# Patient Record
Sex: Male | Born: 1935 | Race: White | Hispanic: No | Smoking: Never smoker
Health system: Southern US, Community
[De-identification: ages and names within clinical notes are randomized; demographics above are authoritative.]

## PROBLEM LIST (undated history)

## (undated) DIAGNOSIS — S42009A Fracture of unspecified part of unspecified clavicle, initial encounter for closed fracture: Secondary | ICD-10-CM

## (undated) DIAGNOSIS — I639 Cerebral infarction, unspecified: Secondary | ICD-10-CM

## (undated) DIAGNOSIS — R413 Other amnesia: Secondary | ICD-10-CM

## (undated) DIAGNOSIS — I10 Essential (primary) hypertension: Secondary | ICD-10-CM

## (undated) DIAGNOSIS — I679 Cerebrovascular disease, unspecified: Secondary | ICD-10-CM

## (undated) DIAGNOSIS — F0390 Unspecified dementia without behavioral disturbance: Secondary | ICD-10-CM

## (undated) DIAGNOSIS — I480 Paroxysmal atrial fibrillation: Secondary | ICD-10-CM

## (undated) DIAGNOSIS — Z9289 Personal history of other medical treatment: Secondary | ICD-10-CM

## (undated) DIAGNOSIS — Z8701 Personal history of pneumonia (recurrent): Secondary | ICD-10-CM

## (undated) DIAGNOSIS — I219 Acute myocardial infarction, unspecified: Secondary | ICD-10-CM

## (undated) DIAGNOSIS — G459 Transient cerebral ischemic attack, unspecified: Secondary | ICD-10-CM

## (undated) DIAGNOSIS — S22010A Wedge compression fracture of first thoracic vertebra, initial encounter for closed fracture: Secondary | ICD-10-CM

## (undated) DIAGNOSIS — I2510 Atherosclerotic heart disease of native coronary artery without angina pectoris: Secondary | ICD-10-CM

## (undated) DIAGNOSIS — R55 Syncope and collapse: Secondary | ICD-10-CM

## (undated) DIAGNOSIS — IMO0002 Reserved for concepts with insufficient information to code with codable children: Secondary | ICD-10-CM

## (undated) DIAGNOSIS — G4731 Primary central sleep apnea: Secondary | ICD-10-CM

## (undated) DIAGNOSIS — B029 Zoster without complications: Secondary | ICD-10-CM

## (undated) DIAGNOSIS — S4290XA Fracture of unspecified shoulder girdle, part unspecified, initial encounter for closed fracture: Secondary | ICD-10-CM

## (undated) DIAGNOSIS — R911 Solitary pulmonary nodule: Secondary | ICD-10-CM

## (undated) DIAGNOSIS — G4739 Other sleep apnea: Secondary | ICD-10-CM

## (undated) HISTORY — DX: Other sleep apnea: G47.39

## (undated) HISTORY — DX: Syncope and collapse: R55

## (undated) HISTORY — DX: Fracture of unspecified part of unspecified clavicle, initial encounter for closed fracture: S42.009A

## (undated) HISTORY — DX: Personal history of pneumonia (recurrent): Z87.01

## (undated) HISTORY — DX: Cerebrovascular disease, unspecified: I67.9

## (undated) HISTORY — PX: KYPHOPLASTY: SHX5884

## (undated) HISTORY — PX: CATARACT EXTRACTION: SUR2

## (undated) HISTORY — PX: TONSILLECTOMY: SUR1361

## (undated) HISTORY — DX: Personal history of other medical treatment: Z92.89

## (undated) HISTORY — DX: Transient cerebral ischemic attack, unspecified: G45.9

## (undated) HISTORY — DX: Primary central sleep apnea: G47.31

## (undated) HISTORY — DX: Paroxysmal atrial fibrillation: I48.0

## (undated) HISTORY — DX: Zoster without complications: B02.9

## (undated) HISTORY — DX: Reserved for concepts with insufficient information to code with codable children: IMO0002

## (undated) HISTORY — DX: Atherosclerotic heart disease of native coronary artery without angina pectoris: I25.10

## (undated) HISTORY — DX: Essential (primary) hypertension: I10

## (undated) HISTORY — PX: SHOULDER SURGERY: SHX246

---

## 1998-12-29 ENCOUNTER — Encounter: Payer: Self-pay | Admitting: Emergency Medicine

## 1998-12-29 ENCOUNTER — Inpatient Hospital Stay (HOSPITAL_COMMUNITY): Admission: EM | Admit: 1998-12-29 | Discharge: 1999-01-01 | Payer: Self-pay | Admitting: Emergency Medicine

## 1999-03-05 ENCOUNTER — Ambulatory Visit (HOSPITAL_COMMUNITY): Admission: RE | Admit: 1999-03-05 | Discharge: 1999-03-05 | Payer: Self-pay | Admitting: *Deleted

## 1999-03-05 ENCOUNTER — Encounter (INDEPENDENT_AMBULATORY_CARE_PROVIDER_SITE_OTHER): Payer: Self-pay | Admitting: Specialist

## 2000-07-12 ENCOUNTER — Emergency Department (HOSPITAL_COMMUNITY): Admission: EM | Admit: 2000-07-12 | Discharge: 2000-07-12 | Payer: Self-pay | Admitting: Emergency Medicine

## 2005-01-04 ENCOUNTER — Emergency Department (HOSPITAL_COMMUNITY): Admission: EM | Admit: 2005-01-04 | Discharge: 2005-01-04 | Payer: Self-pay | Admitting: Emergency Medicine

## 2005-01-05 ENCOUNTER — Inpatient Hospital Stay (HOSPITAL_COMMUNITY): Admission: EM | Admit: 2005-01-05 | Discharge: 2005-01-11 | Payer: Self-pay | Admitting: Emergency Medicine

## 2005-01-06 ENCOUNTER — Encounter (INDEPENDENT_AMBULATORY_CARE_PROVIDER_SITE_OTHER): Payer: Self-pay | Admitting: *Deleted

## 2005-11-30 ENCOUNTER — Ambulatory Visit: Payer: Self-pay | Admitting: Physical Medicine & Rehabilitation

## 2005-11-30 ENCOUNTER — Inpatient Hospital Stay (HOSPITAL_COMMUNITY): Admission: EM | Admit: 2005-11-30 | Discharge: 2005-12-03 | Payer: Self-pay | Admitting: Emergency Medicine

## 2005-12-01 ENCOUNTER — Encounter (INDEPENDENT_AMBULATORY_CARE_PROVIDER_SITE_OTHER): Payer: Self-pay | Admitting: *Deleted

## 2005-12-18 ENCOUNTER — Encounter: Admission: RE | Admit: 2005-12-18 | Discharge: 2006-01-23 | Payer: Self-pay | Admitting: Internal Medicine

## 2008-06-03 ENCOUNTER — Encounter: Admission: RE | Admit: 2008-06-03 | Discharge: 2008-06-03 | Payer: Self-pay | Admitting: Orthopaedic Surgery

## 2011-01-24 NOTE — H&P (Signed)
NAME:  Derek [name], Derek [name] NO.:  [id number]   MEDICAL RECORD NO.:  [id number]          PATIENT TYPE:  INP   LOCATION:  1832                         FACILITY:  [facility]   PHYSICIAN:  Ladell Pier, M.D.   DATE OF BIRTH:  Jul 10, 1927   DATE OF ADMISSION:  01/05/2005  DATE OF DISCHARGE:                                HISTORY & PHYSICAL   CHIEF COMPLAINT:  Syncopal episode.   HISTORY OF PRESENT ILLNESS:  The patient is a 77 year old white male with  past medical history significant for hypertension. The patient was brought  to the emergency department secondary to 2 episodes of syncope. The first  happened 1 day ago. He was at the [employer] and he passed out. Wife was  not there and patient does not remember the specifics of the incident. Then  today, while he was at home sitting around the table, his wife said that he  just was sitting at the table and his head just fell on the table. He had  loss of consciousness. He had no complaints of chest pain or shortness of  breath. She noted he was drooling from his mouth and he was shaking all  over. He did not regain consciousness until after EMS arrived. His wife is  not sure if he was able to have a conversation or was alert when he regained  consciousness. He lost control of his bladder function. During the incident,  he did not bite his tongue. There is no history of any recent illness. No  chest pain or shortness of breath. He noted that he became very dizzy prior  to the episode and that he has generalized weakness.   PAST MEDICAL HISTORY:  Significant for hypertension, cataract surgery.   FAMILY HISTORY:  Noncontributory.   SOCIAL HISTORY:  He does not smoke. Does not drink alcohol. He is married  with 2 children. He is self-employed.   MEDICATIONS:  He is taking Triamterene/HCTZ 37.5/25 1/2 a pill a day, Lotrel  5/20 1 daily, Valtrex 1 gram t.i.d.   ALLERGIES:  PENICILLIN, AUGMENTIN, SULFA.   REVIEW OF SYSTEMS:   As per the HPI.   PHYSICAL EXAMINATION:  VITAL SIGNS:  Temperature 100.8, blood pressure  139/59 laying down and 101/52 standing. Pulse went from 60 to 111.  Respiratory rate 20. Pulse ox 93%.  HEENT:  Head is normocephalic and atraumatic. Pupils are equal, round, and  reactive to light. His throat was without erythema.  CARDIOVASCULAR:  Regular rate and rhythm. With a 2 out of 6 systolic murmur.  LUNGS:  Clear to auscultation bilaterally. No wheezes, rhonchi, or rales.  ABDOMEN:  Soft, nontender, and nondistended. Positive bowel sounds.  EXTREMITIES:  Without edema.  NEUROLOGIC:  Non-focal. Strength was 5 out of 5 throughout. Cranial nerves 2-  12 are intact. Sensation was intact.   LABORATORY DATA:  Pending.   IMPRESSION/PLAN:  1.  One syncopal episode (differential diagnosis) with a combination of      hypoxia with 93% saturation and syncopal episode with orthostatic blood      pressure. Pulmonary embolism is possible. Will  do a D-dimer. Also with      his history of shaking, makes seizure also possible, or cerebrovascular      accident, dehydration, autonomic dysfunction. Will rule out all of the      above. Will get a 2-D echocardiogram, MRI, MRA, carotid Doppler's. Cycle      enzymes. D-dimer, blood cultures x2 with his mild fever and get a      neurology consultation. Will also hold his diuretics.  2.  Hypotension. Will hold his diuretic and continue his other home      medications. Will also start him on aspirin prophylaxis. Will do Lovenox      for deep vein thrombosis prophylaxis.      NJ/MEDQ  D:  01/05/2005  T:  01/06/2005  Job:  [id number]

## 2011-01-24 NOTE — Consult Note (Signed)
NAME:  Derek [name] NO.:  [id number]   MEDICAL RECORD NO.:  [id number]          PATIENT TYPE:  INP   LOCATION:  2013                         FACILITY:  [facility]   PHYSICIAN:  Marlan Palau, M.D.  DATE OF BIRTH:  Oct 22, 1926   DATE OF CONSULTATION:  01/06/2005  DATE OF DISCHARGE:                                   CONSULTATION   HISTORY OF PRESENT ILLNESS:  Derek [name] is a 77 year old, right-  handed white male born December 30, 1926, with a history of hypertension.  The  patient has had a recent outbreak of herpes zoster at the left thoracic  level 4.  The patient had a syncopal or near syncopal event that occurred on  January 04, 2005 while at a party.  Family members were not present, but it  appears that the patient got light-headed and was standing, was able to sit  down, does not believe he passed out, but those around him felt that he did  black out for a few seconds.  The patient had another event that occurred  while sitting while at home.  Family members noted that he became blanched  in the face and diaphoretic, lost consciousness for about 5-10 minutes.  EMS  was called.  The patient was noted to have some trembling or jerking, but no  tongue biting, but did have some urinary incontinence.  The patient came to  once he was laid down and put into the ambulance.  The patient has felt  wiped out since that time.  He was noted to have an irregular heartbeat, but  has not had any further blackouts.  The patient denied any chest pain, chest  pressure, although he was having a lot of chest pain with the zoster  infection.  Neurology was asked to see this patient for further evaluation.  MRI scan of the brain shows mild to moderate chronic small vessel disease.  No acute changes seen.  Cerebrovascular circulation is roughly unremarkable,  except there is some mild to moderate stenosis in the cavernous portion of  the left internal carotid artery, left middle  cerebral artery.  The patient  has no evidence of vertebrobasilar insufficiency.   PAST MEDICAL HISTORY:  1.  History of syncope x 2.  2.  Hypertension.  3.  Herpes zoster infection at the left thoracic level 4.  4.  History of bilateral cataract surgery.  5.  History of lymph node biopsy in the past.   The patient states allergy to PENICILLIN; AUGMENTIN; SULFA drugs.   Does not smoke or drink.   CURRENT MEDICATIONS:  1.  Aspirin 81 mg daily.  2.  Norvasc 5 mg daily.  3.  Lotensin 20 mg daily.  4.  Lovenox.   SOCIAL HISTORY:  This patient lives in the [place] area.  He is married.  Has two children who are alive and well.  Works as a IT trainer.   FAMILY MEDICAL HISTORY:  Notable that mother has passed away with cancer of  the colon.  Father died following a stroke.  Had history of diabetes.  The  patient has three brothers, two sisters.  One brother has had cancer and  died with cancer of the throat.  The patient has one brother who has had a  stroke before, two sisters, one with heart disease, and one with stroke.   REVIEW OF SYSTEMS:  Notable for no recent fevers, chills.  The patient  denies any headache, visual field changes.  The patient does not believe his  vision dimmed prior to loss of consciousness.  The patient denies any  problems with swallowing, neck pain.  Denies shortness of breath, abdominal  pain, and nausea and vomiting, troubles controlling the bowels or bladder,  with the exception of the urinary incontinence as above.  The patient did  not have any focal numbness or weakness on the arms or legs.  Does feel  somewhat generally wiped out.   PHYSICAL EXAMINATION:  VITAL SIGNS:  Blood pressure 147/69; heart rate 82  and irregular; respiratory rate 20; temperature 100.3.  GENERAL:  This patient is a fairly well-developed white male who is alert  and cooperative at the time of examination.  HEENT:  Head is atraumatic.  Eyes:  Pupils are equal, round, and  reactive to  light.  Disks, soft, flat bilaterally.  NECK:  Supple.  No carotid bruits noted.  RESPIRATORY:  Clear.  CARDIOVASCULAR:  Reveals an occasionally irregular rhythm, grade 2-3/6  systolic ejection murmur noted in the left lower sternal border.  EXTREMITIES:  Without significant edema.  NEUROLOGIC:  Cranial nerves as above.  Facial symmetry is present.  The  patient has good sensation in the face to pinprick and soft touch  bilaterally.  Has good strength in the facial muscles and the muscles of the  head turning and shoulder shrug bilaterally.  Speech is well enunciated, not  aphasic.  Motor testing reveals 5/5 strength in all fours.  Good symmetric  motor and tone is noted bilaterally.  Sensory testing is intact to pinprick  and soft touch bilaterally for sensation throughout.  The patient has good  finger-nose-finger, heel-to-shin.  Gait was not tested.  Deep tendon  reflexes depressed but symmetric.  Toes are neutral bilaterally.  No drift  is seen.   LABORATORY VALUES:  White count 5.2, hemoglobin 13.5, hematocrit 38.2, MCV  79.9, platelets 146.  Sodium 137, potassium 3.8, chloride 103, CO2 24,  glucose 113, BUN 15, creatinine 1.3, total bilirubin 0.8, alkaline  phosphatase 61, SGOT 33, SGPT 32, total protein 7, albumin 3.4, CK 62, MB  fraction 0.4, troponin I 0.02.   IMPRESSION:  1.  Syncopal event x 2.  2.  Hypertension.   This patient appears to have had a couple episodes of syncope in the last  two days.  The patient does have a history of diaphoresis, blanching of the  face, consistent with a drop in blood pressure, does have an irregular  pulse.  I suspect that this is not a neurogenic event.  MRI scan of the  brain has been done and shows chronic small vessel disease but no evidence  of  vertebrobasilar insufficiency or acute stroke.  Will proceed with  further workup to rule out a seizure event, but again I think this is  unlikely.   PLAN: 1.  EEG study.   2.  Check orthostatic blood pressures.  3.  Will follow the patient's clinical course while in house.      CKW/MEDQ  D:  01/06/2005  T:  01/06/2005  Job:  [id number]  cc:   Ladell Pier, M.D.  Fax: [phone]   [facility]  [address] [place].  [address]

## 2011-01-24 NOTE — Consult Note (Signed)
NAME:  Derek [name], Derek [name] NO.:  [id number]   MEDICAL RECORD NO.:  [id number]          PATIENT TYPE:  INP   LOCATION:  3032                         FACILITY:  [facility]   PHYSICIAN:  Bevelyn Buckles. Champey, M.D.DATE OF BIRTH:  [birth year]   DATE OF CONSULTATION:  11/30/2005  DATE OF DISCHARGE:                                   CONSULTATION   REQUESTING PHYSICIAN:  Ladell Pier, M.D.   REASON FOR CONSULTATION:  Stroke.   HISTORY OF PRESENT ILLNESS:  Derek [name] is a 77 year old Caucasian male  with a past medical history of hypertension and obstructive sleep apnea who  presents with a one day history of dysarthric slurred speech and  unsteadiness on his feet.  His symptoms started yesterday and when he woke  up this morning, he felt like his speech was more dysarthric.  He also has  had a slight right occipital headache for the past week.  He also noticed  some difficulty swallowing last evening when he was eating pecans.  He  denies any weakness, numbness, vision changes, falls, or loss of  consciousness.  The patient does have a history of one episode of atrial  fibrillation with pneumonia one year ago and has not had any recurrence.   PAST MEDICAL HISTORY:  Positive for hypertension and obstructive sleep  apnea.   CURRENT MEDICATIONS:  Aspirin, AcipHex, clonidine, Avalide, and metoprolol.   ALLERGIES:  The patient has drug allergies to PENICILLIN and SULFAS.   SOCIAL HISTORY:  The patient lives with his wife, denies any smoking or  alcohol use.   REVIEW OF SYSTEMS:  Positive as per history of present illness.  Negative as  per history of present illness and greater than 8 other organ systems.   PHYSICAL EXAMINATION:  VITAL SIGNS:  Temperature 98.1, blood pressure  161/82, then became 219/89, pulse 52, respirations 18, oxygen saturation  100%.  HEENT:  Normocephalic and atraumatic.  Extraocular muscles intact.  Pupils  equal, round, and reactive to light.  The  patient has possibly slight right  facial asymmetry with flattening of the nasolabial fold.  NECK:  Supple.  No carotid bruits are heard.  HEART:  Regular.  LUNGS:  Clear.  ABDOMEN:  Soft and nontender.  EXTREMITIES:  Good pulses.  NEUROLOGIC:  The patient is awake, alert, and oriented x3.  Speech is  dysarthric.  Memory and knowledge are within normal limits.  Cranial nerves:  The patient has some slight right facial asymmetry with flattening of the  nasolabial folds.  The rest of cranial nerves II-XII are grossly intact.  Motor examination shows 4+ to 5/5 strength and normal tone in all four  extremities.  Leg drift is noted.  Sensory examination is within normal  limits to light touch.  Reflexes are 2+ throughout.  Toes are right neutral,  left is neutral and downgoing.  Cerebellar function: The patient has some  difficulty with right finger to nose and rapid alternating movements.  Gait  was not assessed secondary to safety.   LABORATORIES:  Hemoglobin is 15.0, hematocrit 44.0, sodium is 136, potassium  3.9, chloride  is 105, CO2 25, BUN 12, creatinine 1.2, glucose 101.  CK-MB is  less than 1.  Troponin-I is less than 0.05.  Myoglobin is 75.8.  CT of the  head shows some chronic small vessel white matter disease with no acute  process or bleed.   IMPRESSION:  Derek [name] is a 77 year old Caucasian male with hypertension  and obstructive sleep apnea who presents with a one day history of  dysarthria and unsteadiness on his feet.  With findings on examination,  there is concern for possible posterior circulation stroke, possible right  cerebellar or brain stem.  Would like to evaluate further by obtaining an  MRI/MRA of the brain, carotid Doppler's, two-D echo.  Will change his  aspirin to Aggrenox.  Will check lipids and homocysteine level and then  place the patient on telemetry with his history of one episode of atrial  fibrillation.  Will keep systolic blood pressure less than  180 with  hydralazine intravenously.  The patient does have a low heart rate so we  will avoid labetalol intravenously.  Will give PT/OT and speech consults.  Will place the patient on DVT prophylaxis.  We will follow the patient while  he is in the hospital.      Bevelyn Buckles. Nash Shearer, M.D.  Electronically Signed     DRC/MEDQ  D:  11/30/2005  T:  12/02/2005  Job:  [id number]

## 2011-01-24 NOTE — Discharge Summary (Signed)
NAME:  Derek [name], BATALLA NO.:  [id number]   MEDICAL RECORD NO.:  [id number]          PATIENT TYPE:  INP   LOCATION:  2013                         FACILITY:  [facility]   PHYSICIAN:  Ladell Pier, M.D.   DATE OF BIRTH:  1926/10/06   DATE OF ADMISSION:  01/05/2005  DATE OF DISCHARGE:  01/10/2005                                 DISCHARGE SUMMARY   DISCHARGE DIAGNOSES:  1.  Syncopal episode.  2.  Shingles on the left chest extending to mid back with secondary      cellulitis.  3.  Hypertension.  4.  Heart murmur.  5.  Orthostatic hypotension.  6.  Fever.  7.  Mild hypoxia.  8.  Pneumonia.  9.  New onset atrial fibrillation resolved.  10. Pulmonary nodule on CT scan, repeat CT in 6 months.  11. Hypokalemia secondary to Lasix.  12. Pleural effusion.  13. Hypoglycemia secondary to prednisone.   DISCHARGE MEDICATIONS:  1.  Aspirin 81 mg daily.  2.  Doxycycline 100 mg twice a day for 8 days.  3.  Prednisone 10 mg two pills for 1 day then one pill for 2 days.  4.  Metoprolol 50 mg twice daily.  5.  Vicodin 5/500 one every 6 hours p.r.n. for pain.  6.  Atarax 25 one three times a day as needed for itching.  7.  Valtrex 1 g three times a day for 4 days.   FOLLOWUP APPOINTMENT:  Patient to follow up with Derek [name] in 1 week.  Make appointment to follow up with cardiology.   PROCEDURES DURING THE HOSPITALIZATION:  None.   HISTORY OF PRESENT ILLNESS:  Patient is a 77 year old white male past  medical history significant for hypertension.  Patient was brought to the  emergency room secondary to a syncopal episode, two episodes, one the day  prior to presentation and on the day of presentation.  Patient was at home,  he was sitting around the table and per his wife he loss consciousness, he  was out for about 5-10 minutes, he was drooling.  He had no complaints of  chest pain or shortness of breath.  Patient lost control of his bladder  during the incident, did not  bite his tongue, he complained of generalized  weakness.   PAST MEDICAL HISTORY/FAMILY HISTORY/SOCIAL HISTORY/MEDICATIONS/ALLERGIES AND  REVIEW OF SYSTEMS:  Per admission H&P.   PHYSICAL EXAMINATION ON DISCHARGE:  VITAL SIGNS:  Temperature 98.6, pulse of  80, respirations 20, blood pressure 154/81, pulse oximetry 97% on room air.  HEENT:  Head is normocephalic, atraumatic.  Pupils equal, round and reactive  to light.  Throat without erythema.  CARDIOVASCULAR:  Regular rate and rhythm with a 2/6 systolic murmur.  ABDOMEN:  Soft, nontender, nondistended.  Positive bowel sounds.  EXTREMITIES:  Without edema.  SKIN:  On the left chest area he has shingles with erythema that has  improved during the hospitalization.   HOSPITAL COURSE:  Problem 1. SYNCOPAL EPISODE.  For his syncopal episode  neurology was consulted.  He had an EEG to rule out seizures.  His EEG was  normal.  He had an MRI/MRA that did not show any acute ischemia, also had a  2-D echo that was negative, carotid Doppler's negative, D-dimer was  elevated, he had a followup chest CT that was normal.  The syncopal episode  was noted to be probably secondary to atrial fibrillation.  Atrial  fibrillation is probably secondary to the acute event of the pneumonia,  shingles and the stress of the situation.   Problem 2. ATRIAL FIBRILLATION.  He went into atrial fibrillation with RVR  on two occasions during admission.  Cardiology was consulted and he was  placed on Lovenox and beta blocker for rate control, it was thought that his  atrial fibrillation was probably secondary to stress.  He will be put on  aspirin.  He was treated with Lovenox inpatient and after his shingles  resolve he will follow up outpatient for Holter monitor and if he has  recurrent episodes of atrial fibrillation then he will be placed on chronic  anticoagulation.   Problem 3. SHINGLES.  He had shingles with secondary cellulitis.  He is on  Valtrex.  We will  treat him for a total of 10 days to 14 days and he was put  on prednisone because of severe itching and redness in the area and Atarax  p.r.n. for itching.   Problem 4. CELLULITIS SECONDARY TO NUMBER 1 SHINGLES ABOVE.  He was placed  on doxycycline.   Problem 5. PNEUMONIA/ATELECTASIS.  During his hospitalization his oxygen  level was low.  Chest x-ray done showed atelectasis versus pneumonia.  Discussed the chest x-ray report with radiology who thought the patient had  an infiltrate.  His fever spiked to 102.7.  He was started on antibiotics,  Rocephin, Zithromax and the fever resolved.   Problem 6. HYPOKALEMIA.  During the course of the hospitalization he was  given IV fluids and got a little volume overloaded.  He was placed on Lasix  and his symptoms of shortness of breath resolved.   Problem 7. PULMONARY NODULE.  CT scan of the chest showed a pulmonary nodule  and it was recommended that he have followup CT scan in 3-6 months.   Problem 8. HYPERTENSION.  His medication that he was on on admission was  discontinued and he was placed on Toprol for rate control.  BMP on the day  prior to discharge sodium 135, potassium 3.4 (that was replaced), chloride  101, CO2 25, glucose 131 (hypoglycemia) and that is secondary to prednisone,  BUN 14, creatinine 1, calcium 8.3.  WBC 9, hemoglobin 12, MCV of 79.9,  platelets 172.  BNP of 144.  Cardiac enzymes negative.  Urine culture  negative.  Lipid profile:  Cholesterol total 140, triglycerides of 89, HDL  of 31, LDL of 91.  TSH of 0.385.  D-dimer 1.15.  Chest x-ray showed  borderline cardiomegaly with bibasilar atelectasis and left-sided effusion.  CT of the chest  no evidence of PE, scattered mediastinal and hilar lymph nodes which are  borderline in size, no acute pulmonary findings.  There are three or four  small pulmonary nodules in the right lung.  She needs followup CT in 4-6  months.  EEG normal.      NJ/MEDQ  D:  01/10/2005  T:   01/11/2005  Job:  [id number]   cc:   [facility], Dr. Katrinka Blazing

## 2011-01-24 NOTE — H&P (Signed)
NAME:  Derek [name], Derek [name] NO.:  [id number]   MEDICAL RECORD NO.:  [id number]          PATIENT TYPE:  INP   LOCATION:  3032                         FACILITY:  [facility]   PHYSICIAN:  Thora Lance, M.D.  DATE OF BIRTH:  [birth year]   DATE OF ADMISSION:  11/30/2005  DATE OF DISCHARGE:                                HISTORY & PHYSICAL   CHIEF COMPLAINT:  Off-balance.   HISTORY OF PRESENT ILLNESS:  This is a 77 year old white male with history  of hypertension who presented with a 1-day history of mild dizziness,  unsteady gait, and speech difficulties.  Yesterday, on November 29, 2005, he  noticed that he was mildly off-balance and his speech seemed a little  slurred.  This morning this seemed much worse.  His speech has been slurred  and he has had difficultly feeling wobbly while walking, although he has not  fallen.  He tends to fall towards either side.  He has also felt somewhat  dizzy although not definite vertigo.  He had some trouble swallowing some  pecans last night.  He denied any fevers, visual changes, weakness, sensory  change, chest pain, shortness of breath, or palpitations.  He did complain  of some mild pain in his right occipital area this morning.  The patient was  admitted in May 2006 with a syncopal episode, rule out TIA.  He also had a  short episode of paroxysmal atrial fibrillation in the context of a  pneumonia and herpes zoster and cellulitis at that time.  He was seen by Dr.  Anne Hahn of neurology and had a TIA workup including an MRI which showed  chronic small-vessel disease, an MRI/MRA which showed a mild to moderate  left cavernous carotid stenosis and a proximal middle cerebral artery  stenosis.  A 2-D echocardiogram was normal and EEG was normal.  He claims  his systolic blood pressure at home ranges between 130 and over 200  systolic.   PAST MEDICAL HISTORY:  1.  Hypertension.  2.  GERD.  3.  Obstructive sleep apnea, on VPAP.  4.  History  of herpes zoster.  5.  Syncope, April 2006.  6.  Paroxysmal atrial fibrillation, April 2006.  Denies any recurrence.   PAST SURGICAL HISTORY:  1.  Cataracts.  2.  Lymph node biopsy.   ALLERGIES:  PENICILLIN or SULFA.   CURRENT MEDICATIONS:  1.  Lopressor 50 mg b.i.d.  2.  AcipHex 20 mg daily.  3.  Clonidine 0.1 mg b.i.d.  4.  Aspirin 81 mg a day.  5.  Avalide 150/12.5 mg daily.   FAMILY HISTORY:  Noncontributory.   SOCIAL HISTORY:  He is married.  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  Still active in the  business.  Smoking:  No.  Tobacco:  No.   REVIEW OF SYSTEMS:  As above.   PHYSICAL EXAMINATION:  GENERAL:  A 77 year old white male who appears  younger than his stated age.  VITAL SIGNS:  Temperature 98.1, blood pressure 161/81, heart rate 45,  respirations 16, oxygen saturation 100% on room air.  HEENT:  Pupils equal and respond to light.  Extraocular movements are  intact.  TMs are clear.  Oropharynx is clear.  NECK:  Supple.  No bruits, no lymphadenopathy.  LUNGS:  Clear.  HEART:  Regular rate and rhythm without murmur, gallop, or rub.  ABDOMEN:  Soft, nontender, normal bowel sounds.  No mass or  hepatosplenomegaly.  EXTREMITIES:  Show no edema.  NEUROLOGIC:  Alert and oriented x3.  Cranial nerves II-XII are intact.  Strength is 5/5 in all extremities.  There is no pronator drift.  Gait is  mildly wide-based, unsteady.  Cerebellar:  Finger-to-nose well done.  Speech:  Mild dysarthria.   LABORATORIES:  CBC pending.  Urinalysis negative.  Sodium 136, potassium  3.9, chloride 105, BUN 12, creatinine 1.2.  EKG:  Sinus bradycardia and  otherwise normal.  Chest x-ray:  No acute disease.  CT scan of the brain  shows chronic microvascular ischemic changes.   ASSESSMENT:  1.  Suspect posterior circulation cerebrovascular accident.  2.  Hypertension, poorly controlled.  3.  History of paroxysmal atrial  fibrillation.   PLAN:  1.  Admit to telemetry.  2.  MRI of the brain and MRA of the intracranial vessels.  3.  Carotid Dopplers.  4.  A 2-D echocardiogram.  5.  Initiate Aggrenox.  6.  Neurology consult.  7.  N.p.o.  8.  Swallowing evaluation March 26.  9.  PT and OT consult.           ______________________________  Thora Lance, M.D.     JJG/MEDQ  D:  11/30/2005  T:  12/02/2005  Job:  [id number]

## 2011-01-24 NOTE — Consult Note (Signed)
NAME:  Derek [name], Derek [name] NO.:  [id number]   MEDICAL RECORD NO.:  [id number]          PATIENT TYPE:  INP   LOCATION:  2013                         FACILITY:  [facility]   PHYSICIAN:  Lyn Records III, M.D.DATE OF BIRTH:  March 08, 1927   DATE OF CONSULTATION:  01/10/2005  DATE OF DISCHARGE:                                   CONSULTATION   CONCLUSIONS:  1.  Recurrent syncope probably neurally mediated.      1.  Two episodes occurred with prodrome of weakness, diaphoresis, and          pallor on April 28 and January 05, 2005, leading to this admission.      2.  One episode occurred as a teenager while singing in the church          choir.  2.  Paroxysmal atrial fibrillation noted since hospitalized.  The patient is      asymptomatic.  Perhaps, atrial fibrillation is related to an underlying      pulmonary febrile illness.      1.  Cardiac structure is not markedly abnormal, however, there is mild          left ventricular enlargement by echocardiography.  3.  Left chest shingles.  4.  Possible left lung pneumonia versus atelectasis secondary to shingles      with splinting.   RECOMMENDATIONS:  1.  TSH (done).  2.  Beta blocker therapy which could help to squash the arrhythmia and also      be a potential therapy for neurally mediated syncope.  3.  The patient may need anti-arrhythmic therapy if recurrent episodes of      atrial fibrillation occur despite beta blocker therapy.  4.  Continue monitoring patient.  If no recurrent atrial fibrillation, I      would still want him to have a 48 hour Holter monitor done as an      outpatient once he is over the febrile illness.  5.  Will likely eventually need an ischemic evaluation once he is over his      febrile illness.  6.  Agree with Lovenox for now but would not start chronic Coumadin therapy      unless there are recurrences of atrial fibrillation documented after the      patient's febrile illness has been completely  treated.   COMMENTS:  The patient is 67 and was admitted to the hospital on January 05, 2005, after a syncopal episode that occurred while sitting at a table in his  house.  He states that he was comfortable, had been reading, had eaten some  soup and began feeling warm all over and the next thing he knew he was on  the floor.  His family states that he had twitching, pallor, blanching, and  diaphoresis.  He did not complain of any chest discomfort or other  significant prodrome.  Once EMS got there and he was placed on the floor, he  came to.  Two days prior, he had had a similar episode while at a party,  began feeling warm all  over, and the next thing he knew he was down on the  floor.  There was a sense of impending doom preceding this episode.  No  chest discomfort or palpitations were noted.  He had one prior episode of  syncope during his teenage years while singing in the church choir.  He has  no exertional dyspnea, chest discomfort, or other cardiopulmonary  complaints.  Upon admission to the hospital here, he was noted to have  Herpes zoster infection of his left thorax in a dermatomal distribution.  He  had a fever of 101.3.  He was then admitted for evaluation of the syncope.   SIGNIFICANT MEDICAL PROBLEMS:  1.  Hypertension.  2.  History of cataract surgery.   FAMILY HISTORY:  No significant cardiac illnesses in the patient's family  other than that a sister has a history of atrial fibrillation and tachy-  brady syndrome and has had an ablation performed and a pacemaker placed.   MEDICATIONS AT HOME:  Triamterene HCTZ 37.5/25 mg daily, Lotrel 5/20 mg  daily, and Valtrex 1 gram p.o. t.i.d.   SOCIAL HISTORY:  He does not smoke or drink.  He is a self-employed IT trainer.   REVIEW OF SYMPTOMS:  Unremarkable.  The patient is active and without any  significant medical problems.   PHYSICAL EXAMINATION:  GENERAL:  The patient is in no acute distress, he is sitting at the bedside   and speaking with his family.  VITAL SIGNS:  Blood pressure 140/78, heart rate 80 and irregularly  irregular.  CHEST:  He has an erythematous eruption along the left thorax and left  chest.  NECK:  No JVD, no carotid bruits.  HEENT:  Pupils equal and reactive to light.  LUNGS:  Clear, no rales or wheezing is heard.  HEART:  Slightly irregular rhythm with heart rate increasing up to 120 beats  per minute at times, there is a 1/6 systolic murmur, no gallops are heard.  ABDOMEN:  Soft, bowel sounds normal.  EXTREMITIES:  No edema.  NEUROLOGICAL:  No obvious motor or sensory deficits.   LABORATORY DATA:  Potassium 3.5, BUN 10, creatinine 1, sodium 130.  Hemoglobin 12, platelet count 143,000.  CPK, MB, and troponin I are negative  x 5.  BNP and TSH were normal.  EKG reveals normal sinus rhythm with  frequent PACs.  There is one EKG that demonstrates atrial fibrillation.  No  acute ischemic changes noted.  Chest x-ray revealed borderline cardiomegaly,  bibasilar atelectasis.  Echo revealed normal LVEF, mild left atrial  enlargement, mild aortic valve thickening.   DISCUSSION:  The patient's history is complex.  Episodes of syncope could  merely represent neurally mediated syncope especially in light of the  patient's Herpes zoster infection that was causing pain and potentially  acting as a trigger for the episodes of syncope over the last two days prior  to admission.  He is a demonstrated fainter having had an episode of syncope  when he was a young adolescent while singing in the church choir.   The atrial fibrillation is asymptomatic with reference to anything the  patient feels.  This was picked up on the monitor and one wonders if this is  something that has been going on off and on over the years or if this is  something that has been precipitated by his febrile illness and recent bout  of significant shortness of breath.  We have initiated beta blocker therapy and we hope that  this  will help to suppress recurrence of atrial  fibrillation and also give him some protection against possible neurally  mechanisms of syncope.  As an outpatient, we would like for him to have a 48  hour Holter after he has completely recovered from his febrile illness and  the shingles.  We also hope that a  cardiac ischemic evaluation can be done at some later date, as well.  With  reference to Coumadin, I would not start Coumadin just yet but would use  aspirin 325 mg per day and start Coumadin only if documented recurrent  atrial fibrillation once the pulmonary process is completely resolved.      HWS/MEDQ  D:  01/10/2005  T:  01/10/2005  Job:  [id number]   cc:   Marlan Palau, M.D.  [address]  [address]  [place]  [place] [address]  Fax: [phone]   Ladell Pier, M.D.  Fax: [phone]

## 2011-01-24 NOTE — Discharge Summary (Signed)
NAME:  Derek [name], Derek [name] NO.:  [id number]   MEDICAL RECORD NO.:  [id number]          PATIENT TYPE:  INP   LOCATION:  3032                         FACILITY:  [facility]   PHYSICIAN:  Ladell Pier, M.D.   DATE OF BIRTH:  10-Jan-1927   DATE OF ADMISSION:  11/30/2005  DATE OF DISCHARGE:  12/03/2005                                 DISCHARGE SUMMARY   DISCHARGE DIAGNOSES:  1.  Acute pontine stroke with MRI showing pontine infarct.  2.  Hypertension.  3.  Gastroesophageal reflux disease.  4.  Obstructive sleep apnea on continuous positive airway pressure.  5.  History of herpes zoster.  6.  History of syncopal episode in April 2006.  7.  Paroxysmal atrial fibrillation April 2006 without recurrence.  8.  Cataracts.  9.  Lymph node biopsy.   DISCHARGE MEDICATIONS:  1.  Norvasc 10 mg daily.  2.  Aggrenox twice daily.  3.  Hydrochlorothiazide 25 mg daily.  4.  Irbesartan 150 mg daily.   CONSULTANTS:  1.  Neurology, Dr. Pearlean Brownie.  2.  Speech therapy and physical therapy.   PROCEDURES:  None.   FOLLOWUP APPOINTMENTS:  The patient to follow up with neurology and also  follow up with Dr. Olena Leatherwood.   HISTORY OF PRESENT ILLNESS:  The patient is a 77 year old white male,  history of hypertension, presented with 1 day of dizziness, unsteady gait,  and speech difficulty.  He had slurred speech and felt wobbly but no fall.   PAST MEDICAL HISTORY, FAMILY HISTORY, SOCIAL HISTORY, MEDICATIONS,  ALLERGIES, REVIEW OF SYSTEMS:  Per admission H&P.   PHYSICAL EXAMINATION ON DISCHARGE:  VITAL SIGNS:  Temperature 98.6, pulse  68, respirations 20, blood pressure 144/63, pulse oximetry 96% on room air.  HEENT:  Normocephalic, atraumatic.  Pupils equal, round, and reactive to  light.  Throat without erythema.  CARDIOVASCULAR:  Regular rate and rhythm.  LUNGS:  Clear bilaterally.  ABDOMEN:  Positive bowel sounds.  EXTREMITIES:  Without edema.  NEUROLOGIC:  Nonfocal.  Speech had improved  from previous.   HOSPITAL COURSE:  #1 - ACUTE STROKE.  The patient was admitted to the  hospital.  He was placed on Aggrenox, his aspirin.  Neurology was consulted.  He was placed on Aggrenox for acute CVA.  During the course of the  hospitalization he improved.  He was deemed not a candidate for inpatient  rehab and was sent home with home PT, OT, and speech therapy.   #2 - HYPERTENSION.  During his hospitalization he was continued on his home  medications.  Blood pressure remained stable.   #3 - GASTROESOPHAGEAL REFLUX DISEASE.  He was continued on PPI while he was  in the hospital.   #4 - NAUSEA, VOMITING.  He did have some episodes of nausea and vomiting  during his hospitalization that was treated with Zofran and resolved prior  to discharge.   DISCHARGE LABORATORY:  A 2-D echocardiogram showed overall left ventricular  systolic function was normal.  Head CT showed atrophy and chronic small-  vessel changes, no acute findings.  Chest x-ray showed poor inspiration,  possible basilar atelectasis.  MRA showed 50% stenosis of supraclinoid ICA  left.  Wbc's 6.1, hemoglobin 15, platelets 206.  PT 13.6, INR 1.0.  Sodium  136, potassium 3.9, chloride 103, CO2 26, glucose 110, BUN 11, creatinine  1.3.  Hemoglobin A1c 6.2.  Total cholesterol 189, triglycerides 150, HDL 28,  LDL 138.  UA normal.      Ladell Pier, M.D.  Electronically Signed     NJ/MEDQ  D:  01/07/2006  T:  01/07/2006  Job:  [id number]

## 2011-01-24 NOTE — Procedures (Signed)
CLINICAL HISTORY:  Derek [name] is seen here today for an EEG.  This was  ordered by his family practice service attending Dr. Ladell Pier.  The  consulting physician is Dr. Lesia Sago of [facility].  The patient is evaluated for syncopal episode versus seizure.   This 16-channel EEG recording with one channel representing heart rate and  rhythm was performed without hyperventilation due to the patient's history  of cardiopulmonary disease.  Photic stimulation was performed.   Medications:  The patient is on Tylenol, Lovenox, Lotensin, Norvasc and  aspirin.   DESCRIPTION:  A posterior dominant background rhythm was established at the  very beginning of the study and shows an 8 Hz rhythm.  There are frequent  electrode pops noted.  There is very little motion artifact seen and the  patient's EEG soon becomes typical for that of the drowsy stages then  finally enters sleep.  Sleep architecture is seen associated with vertex  sharp waves and sleep spindles forming K complexes.  The EKG shows a  presumed sinus rhythm between 72 and 78 beats per minute with occasional  PVCs.   CONCLUSION:  This is a normal EEG for the patient's age and conscious state.      GE:XBMW  D:  01/07/2005 18:59:03  T:  01/07/2005 23:24:02  Job #:  [id number]   cc:   [facility] Teaching Service

## 2011-02-04 ENCOUNTER — Emergency Department (HOSPITAL_COMMUNITY): Payer: Medicare Other

## 2011-02-04 ENCOUNTER — Inpatient Hospital Stay (HOSPITAL_COMMUNITY): Payer: Medicare Other

## 2011-02-04 ENCOUNTER — Inpatient Hospital Stay (HOSPITAL_COMMUNITY)
Admission: EM | Admit: 2011-02-04 | Discharge: 2011-02-13 | DRG: 484 | Disposition: A | Discharge status: Skilled Nursing Facility | Payer: Medicare Other | Attending: Internal Medicine | Admitting: Internal Medicine

## 2011-02-04 ENCOUNTER — Encounter (HOSPITAL_COMMUNITY): Payer: Self-pay | Admitting: Radiology

## 2011-02-04 DIAGNOSIS — R269 Unspecified abnormalities of gait and mobility: Secondary | ICD-10-CM | POA: Diagnosis present

## 2011-02-04 DIAGNOSIS — I10 Essential (primary) hypertension: Secondary | ICD-10-CM | POA: Diagnosis present

## 2011-02-04 DIAGNOSIS — S42213A Unspecified displaced fracture of surgical neck of unspecified humerus, initial encounter for closed fracture: Secondary | ICD-10-CM | POA: Diagnosis present

## 2011-02-04 DIAGNOSIS — IMO0002 Reserved for concepts with insufficient information to code with codable children: Secondary | ICD-10-CM | POA: Diagnosis present

## 2011-02-04 DIAGNOSIS — Y9301 Activity, walking, marching and hiking: Secondary | ICD-10-CM

## 2011-02-04 DIAGNOSIS — W010XXA Fall on same level from slipping, tripping and stumbling without subsequent striking against object, initial encounter: Secondary | ICD-10-CM | POA: Diagnosis present

## 2011-02-04 DIAGNOSIS — I4891 Unspecified atrial fibrillation: Secondary | ICD-10-CM | POA: Diagnosis present

## 2011-02-04 DIAGNOSIS — R32 Unspecified urinary incontinence: Secondary | ICD-10-CM | POA: Diagnosis present

## 2011-02-04 DIAGNOSIS — R55 Syncope and collapse: Secondary | ICD-10-CM | POA: Diagnosis present

## 2011-02-04 DIAGNOSIS — S42293A Other displaced fracture of upper end of unspecified humerus, initial encounter for closed fracture: Principal | ICD-10-CM | POA: Diagnosis present

## 2011-02-04 DIAGNOSIS — Y9239 Other specified sports and athletic area as the place of occurrence of the external cause: Secondary | ICD-10-CM

## 2011-02-04 DIAGNOSIS — Z8673 Personal history of transient ischemic attack (TIA), and cerebral infarction without residual deficits: Secondary | ICD-10-CM

## 2011-02-04 DIAGNOSIS — F09 Unspecified mental disorder due to known physiological condition: Secondary | ICD-10-CM | POA: Diagnosis present

## 2011-02-04 DIAGNOSIS — K219 Gastro-esophageal reflux disease without esophagitis: Secondary | ICD-10-CM | POA: Diagnosis present

## 2011-02-04 HISTORY — DX: Cerebral infarction, unspecified: I63.9

## 2011-02-04 LAB — LIPID PANEL
Cholesterol: 173 mg/dL (ref 0–200)
HDL: 32 mg/dL — ABNORMAL LOW (ref >39)
Total CHOL/HDL Ratio: 5.4 RATIO
Triglycerides: 92 mg/dL (ref <150)
VLDL: 18 mg/dL (ref 0–40)

## 2011-02-04 LAB — URINALYSIS, ROUTINE W REFLEX MICROSCOPIC
Bilirubin Urine: NEGATIVE
Glucose, UA: NEGATIVE mg/dL
Hgb urine dipstick: NEGATIVE
Ketones, ur: NEGATIVE mg/dL
Leukocytes, UA: NEGATIVE
Nitrite: NEGATIVE
Protein, ur: 30 mg/dL — AB
Specific Gravity, Urine: 1.02 (ref 1.005–1.030)
Urobilinogen, UA: 0.2 mg/dL (ref 0.0–1.0)
pH: 6.5 (ref 5.0–8.0)

## 2011-02-04 LAB — URINE MICROSCOPIC-ADD ON

## 2011-02-04 LAB — CBC
HCT: 37.3 % — ABNORMAL LOW (ref 39.0–52.0)
Hemoglobin: 12.3 g/dL — ABNORMAL LOW (ref 13.0–17.0)
MCH: 25.9 pg — ABNORMAL LOW (ref 26.0–34.0)
MCHC: 33 g/dL (ref 30.0–36.0)
MCV: 78.7 fL (ref 78.0–100.0)
Platelets: 231 K/uL (ref 150–400)
RBC: 4.74 MIL/uL (ref 4.22–5.81)
RDW: 14.8 % (ref 11.5–15.5)
WBC: 7.3 10*3/uL (ref 4.0–10.5)

## 2011-02-04 LAB — DIFFERENTIAL
Basophils Absolute: 0.1 10*3/uL (ref 0.0–0.1)
Basophils Relative: 1 % (ref 0–1)
Eosinophils Absolute: 0.3 K/uL (ref 0.0–0.7)
Eosinophils Relative: 4 % (ref 0–5)
Lymphocytes Relative: 25 % (ref 12–46)
Lymphs Abs: 1.9 10*3/uL (ref 0.7–4.0)
Monocytes Absolute: 0.5 K/uL (ref 0.1–1.0)
Monocytes Relative: 7 % (ref 3–12)
Neutro Abs: 4.6 K/uL (ref 1.7–7.7)
Neutrophils Relative %: 63 % (ref 43–77)

## 2011-02-04 LAB — COMPREHENSIVE METABOLIC PANEL WITH GFR
ALT: 19 U/L (ref 0–53)
AST: 21 U/L (ref 0–37)
Alkaline Phosphatase: 56 U/L (ref 39–117)
BUN: 25 mg/dL — ABNORMAL HIGH (ref 6–23)
CO2: 25 meq/L (ref 19–32)
Chloride: 107 meq/L (ref 96–112)
Creatinine, Ser: 1.49 mg/dL (ref 0.4–1.5)
Glucose, Bld: 152 mg/dL — ABNORMAL HIGH (ref 70–99)
Sodium: 142 meq/L (ref 135–145)
Total Bilirubin: 0.2 mg/dL — ABNORMAL LOW (ref 0.3–1.2)

## 2011-02-04 LAB — PROTIME-INR
INR: 1.05 (ref 0.00–1.49)
Prothrombin Time: 13.9 seconds (ref 11.6–15.2)

## 2011-02-04 LAB — APTT: aPTT: 36 seconds (ref 24–37)

## 2011-02-04 LAB — COMPREHENSIVE METABOLIC PANEL
Albumin: 3.4 g/dL — ABNORMAL LOW (ref 3.5–5.2)
Calcium: 8.7 mg/dL (ref 8.4–10.5)
GFR calc Af Amer: 55 mL/min — ABNORMAL LOW (ref >60)
GFR calc non Af Amer: 45 mL/min — ABNORMAL LOW (ref >60)
Potassium: 3.7 mEq/L (ref 3.5–5.1)
Total Protein: 6.4 g/dL (ref 6.0–8.3)

## 2011-02-04 LAB — CK TOTAL AND CKMB (NOT AT ARMC)
CK, MB: 2.6 ng/mL (ref 0.3–4.0)
Relative Index: 2.2 (ref 0.0–2.5)
Total CK: 120 U/L (ref 7–232)

## 2011-02-04 LAB — TROPONIN I: Troponin I: <0.3 ng/mL (ref <0.30)

## 2011-02-05 DIAGNOSIS — R55 Syncope and collapse: Secondary | ICD-10-CM

## 2011-02-05 LAB — CBC
HCT: 36.4 % — ABNORMAL LOW (ref 39.0–52.0)
Hemoglobin: 11.6 g/dL — ABNORMAL LOW (ref 13.0–17.0)
MCH: 25.7 pg — ABNORMAL LOW (ref 26.0–34.0)
MCHC: 31.9 g/dL (ref 30.0–36.0)

## 2011-02-05 LAB — BASIC METABOLIC PANEL
BUN: 14 mg/dL (ref 6–23)
Chloride: 104 mEq/L (ref 96–112)
Glucose, Bld: 114 mg/dL — ABNORMAL HIGH (ref 70–99)
Potassium: 4.1 mEq/L (ref 3.5–5.1)
Sodium: 137 mEq/L (ref 135–145)

## 2011-02-05 NOTE — H&P (Signed)
NAME:  Derek [name], Derek [name] NO.:  [id number]  MEDICAL RECORD NO.:  [id number]           PATIENT TYPE:  E  LOCATION:  WLED                         FACILITY:  [facility]  PHYSICIAN:  Thad Ranger, MD       DATE OF BIRTH:  May 08, 1927  DATE OF ADMISSION:  02/04/2011 DATE OF DISCHARGE:                             HISTORY & PHYSICAL   PRIMARY CARE PHYSICIAN:  Renford Dills, MD  CHIEF COMPLAINT:  Near syncopal episode with fall.  HISTORY OF PRESENT ILLNESS:  Derek [name] is an 77 year old male with history of hypertension, dementia, prior history of CVA, who presented to the [facility] emergency room with fall.  History was provided by the patient who stated that he was on his early morning walk in the park outside his house around 6:30 a.m.  The patient had more or less completed his walk and was speeding up to his home to use the restroom. The patient stated that he felt dizzy and lightheaded and stumbled.  He apparently fell forward and then started having bilateral shoulder pain which hurt worse when he moved his shoulders.  The patient stated that he had no syncopal episode.  He did not lose consciousness or hit his head.  He stated he had no chest pain or palpitations or diaphoresis, nausea, vomiting, or any prodromal symptoms prior to the fall.  He denied any seizure-like episode.  The patient denied any other symptoms of any recent illness.  He states his appetite has been fine.  The patient has a history of CVA in the past and felt that in the last couple of weeks he was somewhat off balance.  The patient lives at home with his son and was very functionally active prior to this admission.  PAST MEDICAL HISTORY: 1. Hypertension. 2. Dementia. 3. History of CVA.  SOCIAL HISTORY:  The patient denies any smoking or drug use.  He occasionally drinks alcohol.  Currently he lives at home with his son.  ALLERGIES:  PENICILLIN and SULFA DRUGS.  MEDICATIONS PRIOR TO  ADMISSION: 1. Multivitamin 1 tablet p.o. daily. 2. Aggrenox 25/200 1 capsule b.i.d. 3. Amlodipine 10 mg p.o. daily. 4. Aricept 10 mg p.o. daily. 5. Diovan 160 mg p.o. daily.  PHYSICAL EXAM:  VITAL SIGNS:  Blood pressure 142/67, pulse rate 66, respiratory rate 20, temperature 97.7. GENERAL:  The patient is alert, awake and oriented x3, not in acute distress. HEENT:  Anicteric sclerae, conjunctivae.  Pupils reactive to light and accommodation.  EOMI.  Some abrasion on the chin and a dressing done. CARDIOVASCULAR:  S1, S2 clear. CHEST:  Clear to auscultation bilaterally. ABDOMEN:  Soft, nontender, nondistended.  Normal bowel sounds. EXTREMITIES:  No cyanosis, clubbing or edema noted in the upper or lower extremities bilaterally.  Upper extremities:  Both shoulders have been placed in the sling prior to my encounter.  DIAGNOSTIC DATA:  CBC, white count 7.3, hemoglobin 12.3, hematocrit 37.3, platelets 231.  INR 1.0, troponin less than 0.3.  CMP showed sodium 142 with potassium 3.7, BUN 25, creatinine 1.4.  LFTs essentially normal except albumin slightly low at 3.4.  UA negative  for any UTI.  RADIOLOGICAL DATA: 1. Right shoulder x-ray:  Comminuted proximal humeral fracture. 2. Left shoulder x-ray with a humeral neck fracture. 3. Chest x-ray:  No active cardiopulmonary disease. 4. CT C-spine in May 29:  No cervical spine fracture.  Cervical     spondylitic changes with spinal stenosis and cord flattening most     notable C4-5 and C6-7. 5. CT head without contrast:  No skull fracture or intracranial     hemorrhage, prominent small-vessel disease type changes, global     atrophy.  EKG showed sinus rhythm with marked sinus arrhythmia,     prolonged QTC 468.  IMPRESSION AND PLAN:  Derek [name] is an 77 year old male with a prior history of hypertension, dementia and CVA, who was prior in good shape physically until this fall today.  The patient will be admitted for the workup of  near-syncope as well as bilateral shoulder fracture. 1. Near-syncope:  The patient will be admitted to the monitored floor,     rule out for acute coronary syndrome.  We will obtain 2-D     echocardiogram and carotid Dopplers for further workup.  As the     patient did mention gait instability in the last few weeks, I will     obtain MRI of the head to rule out any cerebellar cerebrovascular     accident or any posterior circulation deficits causing gait     instability. 2. Fall with bilateral shoulder fracture:  Orthopedics has been     consulted.  Currently, the patient has been placed in bilateral     slings.  The patient will likely need surgery.  In the anticipation     of that, I will hold Aggrenox for now.  I did consult with     Neurology, Dr. Thana Farr, on the phone.  After the surgery     Aggrenox should be restarted.  The patient is likely going to need     short-term rehab.  He may be a good candidate for inpatient rehab     given his good functional status prior to this fall and good     support at home. 3. Hypertension:  Continue amlodipine and Diovan. 4. History of dementia, mild:  Continue Aricept. 5. Prophylaxis:  Bilateral sequential compressive devices.  CODE STATUS:  I discussed in detail with the patient.  He is DNR/DNI per his wishes, however, the DNR status can be rescinded for the surgery. This patient will be followed by Dr. Renford Dills.     Thad Ranger, MD     RR/MEDQ  D:  02/04/2011  T:  02/04/2011  Job:  [id number]  cc:   Jene Every, M.D. Fax: [phone]  Deirdre Peer. Polite, M.D.  Electronically Signed by Louanna Vanliew  on 02/05/2011 10:58:31 AM

## 2011-02-06 NOTE — Consult Note (Signed)
  NAME:  Derek [name], Derek [name] NO.:  [id number]  MEDICAL RECORD NO.:  [id number]           PATIENT TYPE:  I  LOCATION:  1425                         FACILITY:  [facility]  PHYSICIAN:  Jene Every, M.D.    DATE OF BIRTH:  1927/08/27  DATE OF CONSULTATION: DATE OF DISCHARGE:                                CONSULTATION   REFERRING PHYSICIAN:  Deirdre Peer. Polite, M.D.  CHIEF COMPLAINT:  Bilateral shoulder pain.  HISTORY:  This is an 77 year old male who has a history of a CVA, apparently had a fall on to both shoulders and his head and neck.  He apparently was dizzy, lightheaded and fell.  He presented to the emergency room with bilateral shoulder fractures, had a workup of the cervical spine and head which was negative for an acute event.  REVIEW OF SYSTEMS:  No fevers, chills, change in bowel or bladder function, pain awakening in the night, unexplained recent weight loss.  PAST MEDICAL HISTORY:  Dementia, hypertension, CVA.  SOCIAL HISTORY:  Negative tobacco.  Occasional alcohol.  Lives at home with son.  ALLERGIES:  PENICILLIN and SULFA.  MEDICATIONS:  Aggrenox, amlodipine, Aricept, Diovan.  PHYSICAL EXAMINATION:  VITAL SIGNS:  The patient is afebrile, BP 142/67, pulse 66. HEENT:  He has a small abrasion over the bridge of the nose and the chin. COR:  Regular rate rhythm. PULMONARY:  Clear to auscultation. ABDOMEN:  Soft, nontender. EXTREMITIES:  He has bilateral edema, soft tissue swelling in the shoulders and tender to palpation in anterior subacromial region bilaterally.  Compartments are soft.  Pulses are intact bilaterally.  He has good grip strength, good dorsiflexion and palmar flexion of the wrist.  Sensory exam is intact.  RADIOLOGIC STUDIES:  X-rays of the right shoulder demonstrate comminuted proximal humerus fracture with displacement of the greater tuberosity. It is located in the glenohumeral joint.  Radiographs of the left shoulder demonstrate  a fracture through the surgical neck, rotation of the proximal humerus.  He had located glenohumeral joint.  CT scan of the cervical spine, negative for fracture.  CT scan of the head is negative.  EKG, sinus rhythm.  IMPRESSION: 1. Bilateral proximal humerus fractures, closed, clinically located,     neurovascularly intact. 2. Presyncopal event.  Current workup in progress including carotid     Dopplers, echocardiogram etc.  PLAN AND RECOMMENDATIONS:  We will proceed with CT scan of the shoulders bilaterally to obtain further delineation of the extent of these fractures.  Will require surgical intervention, either hemiarthroplasty or open reduction and internal fixation.  Will consult Dr. Ranell Patrick for his expertise as well.  In the interim, we discussed bilateral slings, ice to the affected areas and told him to use Aggrenox.  I appreciate kind referral of this patient Dr. Renford Dills.     Jene Every, M.D.     Cordelia Pen  D:  02/05/2011  T:  02/05/2011  Job:  [id number]  Electronically Signed by Jene Every M.D. on 02/06/2011 09:42:35 AM

## 2011-02-10 LAB — CBC
HCT: 33.9 % — ABNORMAL LOW (ref 39.0–52.0)
Hemoglobin: 10.7 g/dL — ABNORMAL LOW (ref 13.0–17.0)
MCHC: 31.6 g/dL (ref 30.0–36.0)
MCV: 79.8 fL (ref 78.0–100.0)
RDW: 15.4 % (ref 11.5–15.5)
WBC: 9 10*3/uL (ref 4.0–10.5)

## 2011-02-10 LAB — BASIC METABOLIC PANEL
BUN: 20 mg/dL (ref 6–23)
CO2: 27 mEq/L (ref 19–32)
GFR calc non Af Amer: >60 mL/min (ref >60)
Glucose, Bld: 143 mg/dL — ABNORMAL HIGH (ref 70–99)
Potassium: 4.4 mEq/L (ref 3.5–5.1)

## 2011-02-11 LAB — CBC
HCT: 33 % — ABNORMAL LOW (ref 39.0–52.0)
MCH: 25.9 pg — ABNORMAL LOW (ref 26.0–34.0)
MCHC: 32.7 g/dL (ref 30.0–36.0)
MCV: 79.1 fL (ref 78.0–100.0)
RDW: 15.1 % (ref 11.5–15.5)

## 2011-02-11 LAB — VITAMIN B12: Vitamin B-12: 521 pg/mL (ref 211–911)

## 2011-02-11 LAB — PROTIME-INR: INR: 0.99 (ref 0.00–1.49)

## 2011-02-11 LAB — BASIC METABOLIC PANEL
BUN: 21 mg/dL (ref 6–23)
CO2: 24 mEq/L (ref 19–32)
Chloride: 98 mEq/L (ref 96–112)
Glucose, Bld: 126 mg/dL — ABNORMAL HIGH (ref 70–99)
Potassium: 3.9 mEq/L (ref 3.5–5.1)
Sodium: 134 mEq/L — ABNORMAL LOW (ref 135–145)

## 2011-02-11 LAB — RPR: RPR Ser Ql: NONREACTIVE

## 2011-02-12 ENCOUNTER — Inpatient Hospital Stay (HOSPITAL_COMMUNITY): Payer: Medicare Other

## 2011-02-12 LAB — BASIC METABOLIC PANEL WITH GFR
BUN: 24 mg/dL — ABNORMAL HIGH (ref 6–23)
CO2: 25 meq/L (ref 19–32)
Calcium: 9.1 mg/dL (ref 8.4–10.5)
Chloride: 100 meq/L (ref 96–112)
Creatinine, Ser: 1.09 mg/dL (ref 0.4–1.5)
GFR calc non Af Amer: >60 mL/min
Glucose, Bld: 117 mg/dL — ABNORMAL HIGH (ref 70–99)
Potassium: 4.3 meq/L (ref 3.5–5.1)
Sodium: 134 meq/L — ABNORMAL LOW (ref 135–145)

## 2011-02-12 LAB — PROTIME-INR
INR: 0.96 (ref 0.00–1.49)
Prothrombin Time: 13 s (ref 11.6–15.2)

## 2011-02-12 LAB — TYPE AND SCREEN
ABO/RH(D): O NEG
Antibody Screen: NEGATIVE

## 2011-02-12 LAB — CBC
HCT: 33.4 % — ABNORMAL LOW (ref 39.0–52.0)
Hemoglobin: 10.8 g/dL — ABNORMAL LOW (ref 13.0–17.0)
RBC: 4.2 MIL/uL — ABNORMAL LOW (ref 4.22–5.81)
WBC: 9 10*3/uL (ref 4.0–10.5)

## 2011-02-12 LAB — APTT: aPTT: 52 seconds — ABNORMAL HIGH (ref 24–37)

## 2011-02-12 LAB — ABO/RH: ABO/RH(D): O NEG

## 2011-02-13 LAB — CBC
MCH: 25.7 pg — ABNORMAL LOW (ref 26.0–34.0)
MCV: 80.1 fL (ref 78.0–100.0)
Platelets: 287 10*3/uL (ref 150–400)
RBC: 3.77 MIL/uL — ABNORMAL LOW (ref 4.22–5.81)

## 2011-02-13 LAB — BASIC METABOLIC PANEL
BUN: 21 mg/dL (ref 6–23)
CO2: 26 mEq/L (ref 19–32)
Chloride: 97 mEq/L (ref 96–112)
Creatinine, Ser: 0.85 mg/dL (ref 0.4–1.5)

## 2011-02-18 NOTE — Discharge Summary (Signed)
NAMEMarland Kitchen  Derek [name], Derek [name] NO.:  [id number]  MEDICAL RECORD NO.:  [id number]  LOCATION:  1425                         FACILITY:  [facility]  PHYSICIAN:  Derek [name], M.D. DATE OF BIRTH:  [birth year]  DATE OF ADMISSION:  02/04/2011 DATE OF DISCHARGE:  02/13/2011                              DISCHARGE SUMMARY   DISCHARGE DIAGNOSES: 1. Fall with resultant displaced bilateral proximal humeral fracture,     status post left shoulder hemiarthroplasty, status post right     shoulder fluoroscopic exam under anesthesia.  The patient to be     discharged to a skilled nursing facility with outpatient followup     with Dr. Ranell Patrick in 2 weeks.  The patient allowed to have general     range of motion, bilateral shoulder, no weight bearing. 2. Fall, felt to be mechanical in etiology.  Because of the patient's     cognitive dysfunction, his concerns for unsteady gait and urinary     incontinence, normal pressure hydrocephalus was entertained.     However, the patient was seen by Neurology and did not feel that     this was the etiology of the patient's fall or an issue of concern. 3. Hypertension, has been elevated periodically during this     hospitalization, felt to be secondary to pain when the blood     pressure cuff is applied to the fractured arm. 4. Cognitive dysfunction. 5. Remote history of cerebrovascular accident. 6. Sleep apnea.  DISCHARGE MEDICATIONS: 1. Artificial tears both eyes q.4 h p.r.n. 2. Clonidine 0.1 mg q.8 h p.r.n. systolic blood pressure greater than     180. 3. Vicodin 5/325 1 to 2 tablets q.4 h p.r.n. 4. Methocarbamol 500 mg q.6 h p.r.n. 5. Aggrenox b.i.d. 6. Amlodipine 10 mg daily. 7. Aricept 10 mg daily. 8. Diovan 160 mg daily. 9. Multivitamin.  DISPOSITION:  The patient for discharge to a skilled nursing facility.  CONSULTANTS:  Orthopedic Surgery, Dr. Malon Kindle as well as Dr. Dorie Rank. Neurology, Dr. Levert Feinstein.  Please note there is a  recommendation for the patient to follow up with Neurology 2 months after discharge.  STUDIES: 1. Basic metabolic panel shows sodium of 131, otherwise normal.  CBC,     white count 10.6, hemoglobin 9.7, platelets 287.  B12 level 521.     RPR nonreactive.  TSH 1.3.  Lipid panel, cholesterol 173, LDL 123. 2. CT of the head:  No skull fracture or intracranial hemorrhage,     prominent small-vessel type disease, global atrophy. 3. CT of the cervical spine:  No cervical spine fracture. 4. MRI of the brain:  No acute infarct, remote pontine infarct, global     atrophy.  Ventricular prominence, probably related to atrophy     although difficult to completely exclude a mild component of     hydrocephalus.  No intracranial mass lesion detected. 5. CT of the left shoulder:  Comminuted fracture of the left humeral     surgical neck. 6. CT of the right shoulder:  Comminuted fracture of the surgical neck     of the right humerus.  HISTORY OF PRESENT ILLNESS:  Pleasant 77 year old male presented  to the hospital after a fall.  During the fall, or as a result of the fall, sustained bilateral humerus fracture.  Admission was deemed necessary for further evaluation and treatment.  Please see dictated H and P for further details.  Past medical history, medications, social history, past surgical history, allergies, family history per admission H and P.  HOSPITAL COURSE: 1. Bilateral humerus fracture status post fall:  The patient was seen     in consultation by orthopedist.  Due to the patient's amount of     swelling, the patient was not immediately taken to the OR, in fact,     he was taken to the OR on June 6.  The patient's fall occurred on     May 30.  It was elected to repair left shoulder fracture, results     as dictated above.  There were no hospital complications as a     result.  The patient needs to follow up with Dr. Ranell Patrick in 2 weeks.     In the interim, will continue with gentle range  of motion, however,     no weight-bearing. 2. Fall:  No etiology identified other than a mechanical fall.  The     patient had a 2-D echo, carotid Doppler, was placed on monitor     without any definitive etiology.  Because of the MRI findings,     reported history of wobbly gait and his known cognitive     dysfunction, the idea of normal pressure hydrocephalus was     entertained.  He was seen in consultation by Neurology.  They did     not feel that LP was indicated. 3. Hypertension, periodically greater than optimal during this     hospitalization. 4. Sleep apnea. 5. Remote history of CVA.  As stated above, the patient did not have     any complications during this hospital stay.  It was recommended     that he stay in the hospital to await his surgery as he was a fall     risk.  At this time, he is being discharged to a skilled nursing     facility for rehab.  He will continue meds as outlined above.     Again, does require followup with Orthopedics in 2 weeks and     Neurology in approximately 2 months.  Thank you in advance.     Derek [name]. Derek [name], M.D.     RDP/MEDQ  D:  02/13/2011  T:  02/13/2011  Job:  [id number]  Electronically Signed by Derek [name] M.D. on 02/18/2011 08:31:54 AM

## 2011-02-19 NOTE — Consult Note (Signed)
NAME:  Derek [name], Derek [name] NO.:  [id number]  MEDICAL RECORD NO.:  [id number]  LOCATION:  1425                         FACILITY:  [facility]  PHYSICIAN:  Levert Feinstein, MD          DATE OF BIRTH:  15-Jan-1927  DATE OF CONSULTATION: DATE OF DISCHARGE:                                CONSULTATION   REFERRING PHYSICIAN:  Deirdre Peer. Polite, MD  CHIEF COMPLAINT:  Questionable hydrocephalus by MRI of the brain.  HISTORY OF PRESENT ILLNESS:  The patient is a pleasant 77 year old right- handed Caucasian male, he was admitted to the hospital on Feb 04, 2011, after falling, broke bilateral humerus.  He has past medical history of hypertension, mild cognitive dysfunction, previous CVA, and he fell prior to admission.  This happened at his daily morning walk.  At the end of the walking, he rushed to go back home to use bathroom, he felt dizzy and lightheaded and stumbled.  Upon his hospital admission, he also received MRI of the brain.  I have reviewed the film, which has demonstrated moderate atrophy, ventriculomegaly, and seems to be proportion to atrophy.  There was also periventricular white matter disease.  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  Denied gait difficulty.  Has mild cognitive dysfunction, but highly functional.  No gait difficulty.  No incontinence.  He is planning to have left hemi-arthroplastic surgery this coming Wednesday.  PAST MEDICAL HISTORY:  Hypertension, mild cognitive dysfunction, history of CVA.  SOCIAL HISTORY:  He lives with his son.  Drinks alcohol occasionally. Denies smoking.  ALLERGIES:  PENICILLIN and SULFA.  HOME MEDICATIONS:  Multivitamin, Aggrenox, amlodipine, Aricept 10 mg every day, and Diovan 160 mg every day.  PHYSICAL EXAMINATION:  VITAL SIGNS:  Temperature 98.1, blood pressure 186/83, heart rate 85, respirations of 19. GENERAL:  He is awake, alert, oriented to  time and place.  Could recall 3 out of 3 items. NECK:  Supple. CARDIAC:  Regular rate and rhythm. NEUROLOGIC:  Cranial nerves II through XII.  Pupils equal, round, reactive to light.  Extraocular movements were full.  Facial sensation, strength was normal.  Uvula and tongue midline.  Head turning, shoulder shrugging was symmetric.  Tongue protrusion and cheek strength was normal.  Motor examination, he has bilateral upper extremity in splint. Lower extremity movement without difficulty.  No spasticity.  Sensory was normal to light touch.  Deep tendon reflex was brisk and symmetric. Plantar responses, right side was extensor, left side was flexor.  No dysmetria and gait was deferred.  No severe rigidity noticed.  ASSESSMENT AND PLAN:  77 year old gentleman with abnormal MRI of the brain described above, but he lacks the typical triad clinical features such as gait difficulty, incontinence to support a diagnosis of normal pressure hydrocephalus, no lumbar puncture is needed.  His MRI can be explained by age-related atrophy, periventricular white matter disease. 1. Continue PT/OT.  Resume Aggrenox if cleared by surgeon. 2. Check B12, thyroid, and RPR for treatable cause of memory loss. 3. Follow up [facility] 2 months post discharge.     Levert Feinstein, MD  YY/MEDQ  D:  02/10/2011  T:  02/11/2011  Job:  [id number]  Electronically Signed by Levert Feinstein MD on 02/19/2011 10:14:27 AM

## 2011-03-28 NOTE — Op Note (Signed)
NAMEMarland [name]  ETAI, COPADO NO.:  [id number]  MEDICAL RECORD NO.:  [id number]  LOCATION:  1425                         FACILITY:  [facility]  PHYSICIAN:  Almedia Balls. Ranell Patrick, M.D. DATE OF BIRTH:  10-Nov-1926  DATE OF PROCEDURE:  02/12/2011 DATE OF DISCHARGE:                              OPERATIVE REPORT   PREOPERATIVE DIAGNOSIS:  Displaced bilateral proximal humerus fracture.  POSTOPERATIVE DIAGNOSIS:  Displaced bilateral proximal humerus fracture.  PROCEDURES PERFORMED: 1. Right shoulder fluoroscopic exam under anesthesia with stress     radiography. 2. Left shoulder hemiarthroplasty using DePuy global FX system.  SURGEON:  Almedia Balls. Ranell Patrick, M.D.  ASSISTANTJuliette Alcide, RN.  ANESTHESIA:  General anesthesia was used.  BLOOD LOSS:  100.  FLUID REPLACEMENT:  1000 cc crystalloid.  URINE OUTPUT:  300 cc.  INSTRUMENT COUNTS:  Correct.  COMPLICATIONS:  None.  Perioperative antibiotics were given.  INDICATIONS:  The patient is an 77 year old male who suffered ground- level fall injuring both shoulders.  The patient has displaced bilateral proximal humerus fractures.  Right shoulder was felt to potentially stable fracture pattern and potentially amenable to nonsurgical care. We did discuss with the patient fluoroscopic exam under anesthesia to assess the stability and overall fracture alignment.  Left shoulder was badly displaced and comminuted with head-split component.  The patient presents now for operative treatment having being explained risks and benefits of surgery and wanted to proceed. Family was counseled as well and  elected to proceed with surgery.  After adequate level of general anesthesia was achieved, the patient was positioned in modified beach- chair position.  Right shoulder was examined under anesthesia with fluoroscopic evaluation of multiple planes revealing that the humeral head was in a valgus impacted position but in all planes and  with movement stayed stable on top of the humeral shaft and despite the displacement of the greater tuberosity, the greater tuberosity did move as a unit with the remaining fractured humerus.  It was felt this was potentially amenable to nonsurgical care.  We then went ahead and secured that shoulder and padded appropriately and then moved to the left shoulder which we sterilely prepped and draped in usual manner.  We entered the shoulder using standard deltopectoral approach with a 10 blade scalpel, dissection down through the subcutaneous tissues. Cephalic vein identified, taken laterally.  The deltoid and pectoralis taken medially.  The upper centimeter of pectoralis was released off the humerus.  We identified the bicipital groove, divided the soft tissue over the biceps sheath and then used an osteotome to remove the lesser tuberosity.  We went ahead and debulked the lesser tuberosity and then placed #2 FiberWire suture x2 and a modified W-stitch medial to the lesser tuberosity into the subscapularis tendon.  The capsule was released to allow for better mobility of the subscap.  We identified a normal looking glenoid.  We did go ahead and tenotomized the biceps right at the superior labral attachment.  Once we had 2 sutures in the subscap lesser tuberosity, we went and identified the humeral head that was way posterior, completely displaced.  We brought it up around and then osteotomized the greater tuberosity off the humeral head.  There  was some head split component to this.  We removed the head and sized it to 48.  We next went ahead and placed #2 FiberWire suture x3 and modified W stitch lateral to the greater tuberosity in the rotator cuff tendon.  We then removed excess shattered bone fragments from the shoulder and then went ahead and reamed up to size 12, really probably could have gone to 14 although for the Global FX stem, it is only up to a size 12, so we reamed up to size  12, pulse irrigated the canal and selected the size 12 FX stem with a 48, 18 head impacted that into position and then went ahead and reduced the shoulder, reduced the tuberosities anatomically and the height of the humeral stem was perfect at 5 laser marks with the anterior fin adjacent to biceps groove.  We removed the trial components, thoroughly irrigated the shoulder and then placed a cement restrictor, a size 4, distal to the stem and then using DePuy SmartSet high viscosity cement, vacuum mixing and pressurized with a cement gun we cemented the stem into place.  We drilled prior to cementing 3 drill holes to the humerus, placing 2 sutures centered on the biceps groove for shaft and tuberosity fixation.  Once the cement was down, we placed the stem, again 5 laser marks anterior fin adjacent biceps groove with a suture around the back of the stem and through the medial fin.  Once the cement set up, we impacted the 48, 18 head; reduced the shoulder; reduced the tuberosities; brought the around-the- world stitch medial to the lesser tuberosity and lateral to the greater tuberosity that we performed behind the stem.  We next went ahead and bone grafted extensively the proximal humerus with available bone grafts, reduced tuberosities, tied the tuberosities to the tuberosity sutures first.  We placed 2 rotator interval sutures and tied those.  We then tied our shaft tuberosity sutures medial to lesser and lateral to the greater tuberosity in a mattress fashion, tying that down secure and tied the around-the-world stitch compressing the tuberosities to bone graft.  We then ranged shoulder with no relative movement at all, everything moved together.  We then thoroughly irrigated the shoulder. We were extremely pleased with the outcome.  We then repaired the deltopectoral interval with 0 Vicryl suture followed by 2-0 Vicryl for subcutaneous closure and 4-0 Monocryl for skin.  Steri-Strips  were applied followed by sterile dressing.  The patient tolerated the surgery well.     Almedia Balls. Ranell Patrick, M.D.     SRN/MEDQ  D:  02/12/2011  T:  02/12/2011  Job:  [id number]  Electronically Signed by Malon Kindle  on 03/28/2011 12:04:38 AM

## 2011-09-17 DIAGNOSIS — B351 Tinea unguium: Secondary | ICD-10-CM | POA: Diagnosis not present

## 2011-09-17 DIAGNOSIS — M79609 Pain in unspecified limb: Secondary | ICD-10-CM | POA: Diagnosis not present

## 2011-09-24 DIAGNOSIS — H353190 Nonexudative age-related macular degeneration, unspecified eye, stage unspecified: Secondary | ICD-10-CM | POA: Diagnosis not present

## 2011-09-24 DIAGNOSIS — H43819 Vitreous degeneration, unspecified eye: Secondary | ICD-10-CM | POA: Diagnosis not present

## 2011-09-24 DIAGNOSIS — H35379 Puckering of macula, unspecified eye: Secondary | ICD-10-CM | POA: Diagnosis not present

## 2011-11-17 DIAGNOSIS — R32 Unspecified urinary incontinence: Secondary | ICD-10-CM | POA: Diagnosis not present

## 2011-11-17 DIAGNOSIS — R05 Cough: Secondary | ICD-10-CM | POA: Diagnosis not present

## 2011-11-17 DIAGNOSIS — R5381 Other malaise: Secondary | ICD-10-CM | POA: Diagnosis not present

## 2011-11-18 DIAGNOSIS — R5381 Other malaise: Secondary | ICD-10-CM | POA: Diagnosis not present

## 2011-11-18 DIAGNOSIS — R5383 Other fatigue: Secondary | ICD-10-CM | POA: Diagnosis not present

## 2011-11-24 DIAGNOSIS — J189 Pneumonia, unspecified organism: Secondary | ICD-10-CM | POA: Diagnosis not present

## 2011-12-09 DIAGNOSIS — M79609 Pain in unspecified limb: Secondary | ICD-10-CM | POA: Diagnosis not present

## 2011-12-09 DIAGNOSIS — B351 Tinea unguium: Secondary | ICD-10-CM | POA: Diagnosis not present

## 2012-02-16 DIAGNOSIS — R42 Dizziness and giddiness: Secondary | ICD-10-CM | POA: Diagnosis not present

## 2012-03-02 DIAGNOSIS — B351 Tinea unguium: Secondary | ICD-10-CM | POA: Diagnosis not present

## 2012-03-02 DIAGNOSIS — M79609 Pain in unspecified limb: Secondary | ICD-10-CM | POA: Diagnosis not present

## 2012-03-14 ENCOUNTER — Emergency Department (HOSPITAL_COMMUNITY): Payer: Medicare Other

## 2012-03-14 ENCOUNTER — Encounter (HOSPITAL_COMMUNITY): Payer: Self-pay | Admitting: Physical Medicine and Rehabilitation

## 2012-03-14 ENCOUNTER — Inpatient Hospital Stay (HOSPITAL_COMMUNITY): Payer: Medicare Other

## 2012-03-14 ENCOUNTER — Inpatient Hospital Stay (HOSPITAL_COMMUNITY)
Admission: EM | Admit: 2012-03-14 | Discharge: 2012-03-19 | DRG: 563 | Disposition: A | Discharge status: Skilled Nursing Facility | Payer: Medicare Other | Attending: General Surgery | Admitting: General Surgery

## 2012-03-14 DIAGNOSIS — Z880 Allergy status to penicillin: Secondary | ICD-10-CM | POA: Diagnosis not present

## 2012-03-14 DIAGNOSIS — K5900 Constipation, unspecified: Secondary | ICD-10-CM | POA: Diagnosis not present

## 2012-03-14 DIAGNOSIS — S270XXA Traumatic pneumothorax, initial encounter: Secondary | ICD-10-CM | POA: Diagnosis present

## 2012-03-14 DIAGNOSIS — R079 Chest pain, unspecified: Secondary | ICD-10-CM | POA: Diagnosis not present

## 2012-03-14 DIAGNOSIS — I998 Other disorder of circulatory system: Secondary | ICD-10-CM | POA: Diagnosis not present

## 2012-03-14 DIAGNOSIS — R6889 Other general symptoms and signs: Secondary | ICD-10-CM | POA: Diagnosis not present

## 2012-03-14 DIAGNOSIS — M25519 Pain in unspecified shoulder: Secondary | ICD-10-CM | POA: Diagnosis not present

## 2012-03-14 DIAGNOSIS — S42033A Displaced fracture of lateral end of unspecified clavicle, initial encounter for closed fracture: Principal | ICD-10-CM | POA: Diagnosis present

## 2012-03-14 DIAGNOSIS — J939 Pneumothorax, unspecified: Secondary | ICD-10-CM

## 2012-03-14 DIAGNOSIS — Z23 Encounter for immunization: Secondary | ICD-10-CM | POA: Diagnosis not present

## 2012-03-14 DIAGNOSIS — I498 Other specified cardiac arrhythmias: Secondary | ICD-10-CM | POA: Diagnosis not present

## 2012-03-14 DIAGNOSIS — R011 Cardiac murmur, unspecified: Secondary | ICD-10-CM | POA: Diagnosis not present

## 2012-03-14 DIAGNOSIS — Z96619 Presence of unspecified artificial shoulder joint: Secondary | ICD-10-CM | POA: Diagnosis not present

## 2012-03-14 DIAGNOSIS — I495 Sick sinus syndrome: Secondary | ICD-10-CM | POA: Diagnosis present

## 2012-03-14 DIAGNOSIS — J9383 Other pneumothorax: Secondary | ICD-10-CM | POA: Diagnosis not present

## 2012-03-14 DIAGNOSIS — F0390 Unspecified dementia without behavioral disturbance: Secondary | ICD-10-CM | POA: Diagnosis present

## 2012-03-14 DIAGNOSIS — I959 Hypotension, unspecified: Secondary | ICD-10-CM | POA: Diagnosis not present

## 2012-03-14 DIAGNOSIS — J9819 Other pulmonary collapse: Secondary | ICD-10-CM | POA: Diagnosis not present

## 2012-03-14 DIAGNOSIS — I10 Essential (primary) hypertension: Secondary | ICD-10-CM | POA: Diagnosis present

## 2012-03-14 DIAGNOSIS — I4891 Unspecified atrial fibrillation: Secondary | ICD-10-CM | POA: Diagnosis present

## 2012-03-14 DIAGNOSIS — Z8673 Personal history of transient ischemic attack (TIA), and cerebral infarction without residual deficits: Secondary | ICD-10-CM

## 2012-03-14 DIAGNOSIS — T148XXA Other injury of unspecified body region, initial encounter: Secondary | ICD-10-CM | POA: Diagnosis not present

## 2012-03-14 DIAGNOSIS — S272XXA Traumatic hemopneumothorax, initial encounter: Secondary | ICD-10-CM

## 2012-03-14 DIAGNOSIS — J984 Other disorders of lung: Secondary | ICD-10-CM | POA: Diagnosis not present

## 2012-03-14 DIAGNOSIS — IMO0001 Reserved for inherently not codable concepts without codable children: Secondary | ICD-10-CM | POA: Diagnosis not present

## 2012-03-14 DIAGNOSIS — Z882 Allergy status to sulfonamides status: Secondary | ICD-10-CM

## 2012-03-14 DIAGNOSIS — Y9210 Unspecified residential institution as the place of occurrence of the external cause: Secondary | ICD-10-CM | POA: Diagnosis not present

## 2012-03-14 DIAGNOSIS — R001 Bradycardia, unspecified: Secondary | ICD-10-CM | POA: Diagnosis not present

## 2012-03-14 DIAGNOSIS — E876 Hypokalemia: Secondary | ICD-10-CM | POA: Diagnosis not present

## 2012-03-14 DIAGNOSIS — Z79899 Other long term (current) drug therapy: Secondary | ICD-10-CM | POA: Diagnosis not present

## 2012-03-14 DIAGNOSIS — T40605A Adverse effect of unspecified narcotics, initial encounter: Secondary | ICD-10-CM | POA: Diagnosis not present

## 2012-03-14 DIAGNOSIS — I499 Cardiac arrhythmia, unspecified: Secondary | ICD-10-CM

## 2012-03-14 DIAGNOSIS — R4182 Altered mental status, unspecified: Secondary | ICD-10-CM | POA: Diagnosis not present

## 2012-03-14 DIAGNOSIS — Z043 Encounter for examination and observation following other accident: Secondary | ICD-10-CM | POA: Diagnosis not present

## 2012-03-14 DIAGNOSIS — N281 Cyst of kidney, acquired: Secondary | ICD-10-CM | POA: Diagnosis not present

## 2012-03-14 DIAGNOSIS — R42 Dizziness and giddiness: Secondary | ICD-10-CM

## 2012-03-14 DIAGNOSIS — J90 Pleural effusion, not elsewhere classified: Secondary | ICD-10-CM | POA: Diagnosis not present

## 2012-03-14 DIAGNOSIS — K8020 Calculus of gallbladder without cholecystitis without obstruction: Secondary | ICD-10-CM | POA: Diagnosis not present

## 2012-03-14 DIAGNOSIS — S42002A Fracture of unspecified part of left clavicle, initial encounter for closed fracture: Secondary | ICD-10-CM | POA: Diagnosis present

## 2012-03-14 DIAGNOSIS — Y92410 Unspecified street and highway as the place of occurrence of the external cause: Secondary | ICD-10-CM

## 2012-03-14 DIAGNOSIS — Z471 Aftercare following joint replacement surgery: Secondary | ICD-10-CM | POA: Diagnosis not present

## 2012-03-14 DIAGNOSIS — R0602 Shortness of breath: Secondary | ICD-10-CM | POA: Diagnosis not present

## 2012-03-14 DIAGNOSIS — G4730 Sleep apnea, unspecified: Secondary | ICD-10-CM | POA: Diagnosis not present

## 2012-03-14 DIAGNOSIS — G4733 Obstructive sleep apnea (adult) (pediatric): Secondary | ICD-10-CM | POA: Diagnosis present

## 2012-03-14 DIAGNOSIS — R918 Other nonspecific abnormal finding of lung field: Secondary | ICD-10-CM | POA: Diagnosis not present

## 2012-03-14 DIAGNOSIS — S42009A Fracture of unspecified part of unspecified clavicle, initial encounter for closed fracture: Secondary | ICD-10-CM

## 2012-03-14 DIAGNOSIS — I359 Nonrheumatic aortic valve disorder, unspecified: Secondary | ICD-10-CM | POA: Diagnosis not present

## 2012-03-14 DIAGNOSIS — Z5189 Encounter for other specified aftercare: Secondary | ICD-10-CM | POA: Diagnosis not present

## 2012-03-14 DIAGNOSIS — G319 Degenerative disease of nervous system, unspecified: Secondary | ICD-10-CM | POA: Diagnosis not present

## 2012-03-14 DIAGNOSIS — S20219A Contusion of unspecified front wall of thorax, initial encounter: Secondary | ICD-10-CM

## 2012-03-14 HISTORY — DX: Fracture of unspecified shoulder girdle, part unspecified, initial encounter for closed fracture: S42.90XA

## 2012-03-14 LAB — CBC WITH DIFFERENTIAL/PLATELET
Basophils Absolute: 0 10*3/uL (ref 0.0–0.1)
Basophils Relative: 0 % (ref 0–1)
Eosinophils Absolute: 0.1 10*3/uL (ref 0.0–0.7)
Eosinophils Relative: 1 % (ref 0–5)
MCH: 26 pg (ref 26.0–34.0)
MCV: 77.3 fL — ABNORMAL LOW (ref 78.0–100.0)
Platelets: 236 10*3/uL (ref 150–400)
RDW: 14.2 % (ref 11.5–15.5)
WBC: 11.8 10*3/uL — ABNORMAL HIGH (ref 4.0–10.5)

## 2012-03-14 LAB — PROTIME-INR
INR: 1.02 (ref 0.00–1.49)
Prothrombin Time: 13.6 seconds (ref 11.6–15.2)

## 2012-03-14 LAB — URINALYSIS, ROUTINE W REFLEX MICROSCOPIC
Bilirubin Urine: NEGATIVE
Ketones, ur: 15 mg/dL — AB
Nitrite: NEGATIVE
Protein, ur: 100 mg/dL — AB
Urobilinogen, UA: 0.2 mg/dL (ref 0.0–1.0)

## 2012-03-14 LAB — BASIC METABOLIC PANEL
Calcium: 9.5 mg/dL (ref 8.4–10.5)
GFR calc non Af Amer: 57 mL/min — ABNORMAL LOW (ref >90)
Sodium: 138 mEq/L (ref 135–145)

## 2012-03-14 LAB — TROPONIN I: Troponin I: <0.3 ng/mL (ref <0.30)

## 2012-03-14 LAB — POCT I-STAT TROPONIN I: Troponin i, poc: 0 ng/mL (ref 0.00–0.08)

## 2012-03-14 LAB — MRSA PCR SCREENING: MRSA by PCR: NEGATIVE

## 2012-03-14 MED ORDER — ONDANSETRON HCL 8 MG PO TABS: 4.0000 mg | ORAL_TABLET | Freq: Four times a day (QID) | ORAL | Status: DC | PRN

## 2012-03-14 MED ORDER — MORPHINE SULFATE 2 MG/ML IJ SOLN: 1.0000 mg | INTRAMUSCULAR | Status: DC | PRN

## 2012-03-14 MED ORDER — PANTOPRAZOLE SODIUM 40 MG PO TBEC
40.0000 mg | DELAYED_RELEASE_TABLET | Freq: Every day | ORAL | Status: DC
  Administered 2012-03-16 – 2012-03-19 (×4): 40 mg via ORAL
  Filled 2012-03-14 (×4): qty 1

## 2012-03-14 MED ORDER — ONDANSETRON HCL 4 MG/2ML IJ SOLN: 4.0000 mg | Freq: Four times a day (QID) | INTRAMUSCULAR | Status: DC | PRN

## 2012-03-14 MED ORDER — ONDANSETRON HCL 4 MG PO TABS: 4.0000 mg | ORAL_TABLET | Freq: Four times a day (QID) | ORAL | Status: DC | PRN

## 2012-03-14 MED ORDER — MORPHINE SULFATE 2 MG/ML IJ SOLN: 2.0000 mg | INTRAMUSCULAR | Status: DC | PRN

## 2012-03-14 MED ORDER — SODIUM CHLORIDE 0.9 % IV BOLUS (SEPSIS)
500.0000 mL | INTRAVENOUS | Status: AC
  Administered 2012-03-14: 500 mL via INTRAVENOUS

## 2012-03-14 MED ORDER — BRIMONIDINE TARTRATE 0.15 % OP SOLN
1.0000 [drp] | Freq: Two times a day (BID) | OPHTHALMIC | Status: DC
  Filled 2012-03-14: qty 5

## 2012-03-14 MED ORDER — PANTOPRAZOLE SODIUM 40 MG IV SOLR: 40.0000 mg | Freq: Every day | INTRAVENOUS | Status: DC

## 2012-03-14 MED ORDER — DONEPEZIL HCL 10 MG PO TABS
10.0000 mg | ORAL_TABLET | Freq: Every day | ORAL | Status: DC
  Administered 2012-03-14 – 2012-03-18 (×5): 10 mg via ORAL
  Filled 2012-03-14 (×6): qty 1

## 2012-03-14 MED ORDER — MORPHINE SULFATE 4 MG/ML IJ SOLN
4.0000 mg | INTRAMUSCULAR | Status: DC | PRN
  Administered 2012-03-14: 4 mg via INTRAVENOUS
  Filled 2012-03-14: qty 1

## 2012-03-14 MED ORDER — IOHEXOL 300 MG/ML  SOLN
100.0000 mL | Freq: Once | INTRAMUSCULAR | Status: AC | PRN
  Administered 2012-03-14: 100 mL via INTRAVENOUS

## 2012-03-14 MED ORDER — MORPHINE SULFATE 2 MG/ML IJ SOLN
INTRAMUSCULAR | Status: AC
  Filled 2012-03-14: qty 1

## 2012-03-14 MED ORDER — ONDANSETRON HCL 4 MG/2ML IJ SOLN
INTRAMUSCULAR | Status: AC
  Filled 2012-03-14: qty 2

## 2012-03-14 MED ORDER — MORPHINE SULFATE 4 MG/ML IJ SOLN: 4.0000 mg | INTRAMUSCULAR | Status: DC | PRN

## 2012-03-14 MED ORDER — BRIMONIDINE TARTRATE 0.2 % OP SOLN
1.0000 [drp] | Freq: Two times a day (BID) | OPHTHALMIC | Status: DC
  Administered 2012-03-14 – 2012-03-19 (×10): 1 [drp] via OPHTHALMIC
  Filled 2012-03-14 (×2): qty 5

## 2012-03-14 MED ORDER — SODIUM CHLORIDE 0.9 % IV SOLN
INTRAVENOUS | Status: DC
  Administered 2012-03-14: 17:00:00 via INTRAVENOUS
  Administered 2012-03-17: 20 mL/h via INTRAVENOUS

## 2012-03-14 MED ORDER — AMLODIPINE BESYLATE 10 MG PO TABS
10.0000 mg | ORAL_TABLET | Freq: Every day | ORAL | Status: DC
  Administered 2012-03-15: 10 mg via ORAL
  Filled 2012-03-14 (×2): qty 1

## 2012-03-14 MED ORDER — PANTOPRAZOLE SODIUM 40 MG PO TBEC: 40.0000 mg | DELAYED_RELEASE_TABLET | Freq: Every day | ORAL | Status: DC

## 2012-03-14 MED ORDER — PANTOPRAZOLE SODIUM 40 MG IV SOLR
40.0000 mg | Freq: Every day | INTRAVENOUS | Status: DC
  Administered 2012-03-15: 40 mg via INTRAVENOUS
  Filled 2012-03-14: qty 40

## 2012-03-14 MED ORDER — IRBESARTAN 300 MG PO TABS
300.0000 mg | ORAL_TABLET | Freq: Every day | ORAL | Status: DC
  Administered 2012-03-15: 300 mg via ORAL
  Filled 2012-03-14 (×2): qty 1

## 2012-03-14 MED ORDER — ONDANSETRON HCL 4 MG/2ML IJ SOLN
4.0000 mg | Freq: Four times a day (QID) | INTRAMUSCULAR | Status: DC | PRN
  Administered 2012-03-14: 4 mg via INTRAVENOUS

## 2012-03-14 MED ORDER — SODIUM CHLORIDE 0.9 % IV SOLN: INTRAVENOUS | Status: DC

## 2012-03-14 MED ORDER — MORPHINE SULFATE 2 MG/ML IJ SOLN
2.0000 mg | INTRAMUSCULAR | Status: DC | PRN
  Administered 2012-03-14: 2 mg via INTRAVENOUS
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
## 2012-03-14 NOTE — ED Notes (Signed)
Pt to CT

## 2012-03-14 NOTE — ED Notes (Signed)
Pts daughter at bedside speaking to [facility]

## 2012-03-14 NOTE — Progress Notes (Signed)
In to see patient at this time. Patient at the current time reports feeling flushed, nauseated, and is diaphoretic. Patients HR has dropped from the 70's NSR to 40-45 SB, SBP from 150's to 80's, 02 sats 90-95. STAT EKG taken on patient, Neuro assessment performed on patient. Spell lasted for approximately 2 min, at the current time the patient has HR 80's NSR, SBP 120-130's, and 02 sats 95 on 2LNC.  MD notified at this time, no new orders obtained at this time. Will continue to monitor the patient.
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
## 2012-03-14 NOTE — ED Notes (Signed)
General Surgeon speaking to pts daughter at this time concerning radiology results.
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
## 2012-03-14 NOTE — ED Notes (Signed)
Called floor to give report. RN will call back 

## 2012-03-14 NOTE — ED Notes (Signed)
Helped pt change into gown to use the urinal. When pt sat up he started to feel nauseas and was clammy. Pt radial pulse noted to be in 40s and irregular. Dr Harlon Flor to bedside. Pt helped to sit back and NT performing EKG at this time.
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
## 2012-03-14 NOTE — ED Notes (Signed)
Pt taken off of long spine board, able to move all extremities, no numbness/tingling noted. Denies back pain. c-collar remains in place.
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
## 2012-03-15 ENCOUNTER — Inpatient Hospital Stay (HOSPITAL_COMMUNITY): Payer: Medicare Other

## 2012-03-15 ENCOUNTER — Encounter (HOSPITAL_COMMUNITY): Payer: Self-pay | Admitting: *Deleted

## 2012-03-15 DIAGNOSIS — S270XXA Traumatic pneumothorax, initial encounter: Secondary | ICD-10-CM | POA: Diagnosis present

## 2012-03-15 DIAGNOSIS — S42002A Fracture of unspecified part of left clavicle, initial encounter for closed fracture: Secondary | ICD-10-CM | POA: Diagnosis present

## 2012-03-15 DIAGNOSIS — Z8673 Personal history of transient ischemic attack (TIA), and cerebral infarction without residual deficits: Secondary | ICD-10-CM | POA: Insufficient documentation

## 2012-03-15 DIAGNOSIS — F0390 Unspecified dementia without behavioral disturbance: Secondary | ICD-10-CM | POA: Insufficient documentation

## 2012-03-15 DIAGNOSIS — R001 Bradycardia, unspecified: Secondary | ICD-10-CM | POA: Diagnosis not present

## 2012-03-15 LAB — PROTIME-INR: INR: 1.07 (ref 0.00–1.49)

## 2012-03-15 LAB — CBC
HCT: 33.8 % — ABNORMAL LOW (ref 39.0–52.0)
Hemoglobin: 11 g/dL — ABNORMAL LOW (ref 13.0–17.0)
MCH: 25.1 pg — ABNORMAL LOW (ref 26.0–34.0)
MCHC: 32.5 g/dL (ref 30.0–36.0)
RBC: 4.38 MIL/uL (ref 4.22–5.81)

## 2012-03-15 LAB — BASIC METABOLIC PANEL
BUN: 17 mg/dL (ref 6–23)
CO2: 23 mEq/L (ref 19–32)
Calcium: 8.8 mg/dL (ref 8.4–10.5)
GFR calc non Af Amer: 64 mL/min — ABNORMAL LOW (ref >90)
Glucose, Bld: 129 mg/dL — ABNORMAL HIGH (ref 70–99)

## 2012-03-15 MED ORDER — LIDOCAINE HCL 2 % EX GEL
Freq: Once | CUTANEOUS | Status: DC
  Filled 2012-03-15: qty 5

## 2012-03-15 MED ORDER — HYDROMORPHONE HCL PF 1 MG/ML IJ SOLN
0.5000 mg | INTRAMUSCULAR | Status: DC | PRN
  Administered 2012-03-16: 1 mg via INTRAVENOUS
  Administered 2012-03-17: 0.5 mg via INTRAVENOUS
  Filled 2012-03-15 (×2): qty 1

## 2012-03-15 MED ORDER — OXYCODONE-ACETAMINOPHEN 5-325 MG PO TABS
1.0000 | ORAL_TABLET | ORAL | Status: DC | PRN
  Administered 2012-03-15 – 2012-03-16 (×5): 1 via ORAL
  Administered 2012-03-16: 2 via ORAL
  Administered 2012-03-16 – 2012-03-17 (×3): 1 via ORAL
  Administered 2012-03-17 (×2): 2 via ORAL
  Filled 2012-03-15: qty 2
  Filled 2012-03-15 (×2): qty 1
  Filled 2012-03-15: qty 2
  Filled 2012-03-15 (×3): qty 1
  Filled 2012-03-15: qty 2
  Filled 2012-03-15: qty 1
  Filled 2012-03-15: qty 2

## 2012-03-15 NOTE — Progress Notes (Signed)
Orthopedic Tech Progress Note Patient Details:  Derek [name] May 13, 1927 [id number]  Ortho Devices Type of Ortho Device: Arm foam sling Ortho Device/Splint Interventions: Application   Cammer, Mickie Bail 03/15/2012, 10:03 AM
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
## 2012-03-15 NOTE — Progress Notes (Signed)
UR complete 
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
## 2012-03-16 ENCOUNTER — Inpatient Hospital Stay (HOSPITAL_COMMUNITY): Payer: Medicare Other

## 2012-03-16 DIAGNOSIS — I498 Other specified cardiac arrhythmias: Secondary | ICD-10-CM

## 2012-03-16 DIAGNOSIS — R42 Dizziness and giddiness: Secondary | ICD-10-CM

## 2012-03-16 LAB — MAGNESIUM: Magnesium: 1.8 mg/dL (ref 1.5–2.5)

## 2012-03-16 LAB — BASIC METABOLIC PANEL
GFR calc Af Amer: 64 mL/min — ABNORMAL LOW (ref >90)
GFR calc non Af Amer: 55 mL/min — ABNORMAL LOW (ref >90)
Glucose, Bld: 137 mg/dL — ABNORMAL HIGH (ref 70–99)
Potassium: 4.2 mEq/L (ref 3.5–5.1)
Sodium: 136 mEq/L (ref 135–145)

## 2012-03-16 LAB — CBC
Hemoglobin: 12.1 g/dL — ABNORMAL LOW (ref 13.0–17.0)
MCHC: 32.9 g/dL (ref 30.0–36.0)
Platelets: 199 10*3/uL (ref 150–400)
RDW: 14.7 % (ref 11.5–15.5)

## 2012-03-16 LAB — GLUCOSE, CAPILLARY: Glucose-Capillary: 164 mg/dL — ABNORMAL HIGH (ref 70–99)

## 2012-03-16 MED ORDER — ENOXAPARIN SODIUM 40 MG/0.4ML ~~LOC~~ SOLN
40.0000 mg | SUBCUTANEOUS | Status: DC
  Administered 2012-03-16 – 2012-03-19 (×4): 40 mg via SUBCUTANEOUS
  Filled 2012-03-16 (×4): qty 0.4

## 2012-03-16 MED ORDER — DOCUSATE SODIUM 100 MG PO CAPS
100.0000 mg | ORAL_CAPSULE | Freq: Every day | ORAL | Status: DC
  Administered 2012-03-16 – 2012-03-19 (×4): 100 mg via ORAL
  Filled 2012-03-16 (×4): qty 1
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
## 2012-03-16 NOTE — Clinical Social Work Placement (Addendum)
    Clinical Social Work Department CLINICAL SOCIAL WORK PLACEMENT NOTE 03/16/2012  Patient:  TAESHAWN, HELFMAN  Account Number:  [id number] Admit date:  03/14/2012  Clinical Social Worker:  Pearson Forster  Date/time:  03/16/2012 10:15 AM  Clinical Social Work is seeking post-discharge placement for this patient at the following level of care:   SKILLED NURSING   (*CSW will update this form in Epic as items are completed)   03/16/2012  Patient/family provided with [facility] Department of Clinical Social Work's list of facilities offering this level of care within the geographic area requested by the patient (or if unable, by the patient's family).  03/16/2012  Patient/family informed of their freedom to choose among providers that offer the needed level of care, that participate in Medicare, Medicaid or managed care program needed by the patient, have an available bed and are willing to accept the patient.  03/16/2012  Patient/family informed of [facility]' ownership interest in [facility], as well as of the fact that they are under no obligation to receive care at this facility.  PASARR submitted to EDS on  PASARR number received from EDS on   FL2 transmitted to all facilities in geographic area requested by pt/family on  03/16/2012 FL2 transmitted to all facilities within larger geographic area on   Patient informed that his/her managed care company has contracts with or will negotiate with  certain facilities, including the following:     Patient/family informed of bed offers received:  03/17/2012 Patient chooses bed at [facility] Physician recommends and patient chooses bed at    Patient to be transferred to [facility] on  03/19/2012  Patient to be transferred to facility by patient family  The following physician request were entered in Epic:   Additional Comments: 07/09 - Patient has a pre-existing PASARR from previous stay at [facility].   Patient and patient family preference is for [facility] return at discharge - facility notified.
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
## 2012-03-16 NOTE — Progress Notes (Signed)
Ekg completed for syncopal episode. No new findings. Pupils equal and reactive. Patient A & O x4. Slow to respond directly after episode but speech cleared after approximately 10 mins of rest.  Patient lying now and feels "fine".  Strength equal on all extremities.  Pulses intact. Face symmetrical while smiling and sticking out tongue.  Dr. Janee Morn updated - cardiology being consulted. Will continue to monitor.

## 2012-03-17 ENCOUNTER — Inpatient Hospital Stay (HOSPITAL_COMMUNITY): Payer: Medicare Other

## 2012-03-17 LAB — URINALYSIS, ROUTINE W REFLEX MICROSCOPIC
Bilirubin Urine: NEGATIVE
Leukocytes, UA: NEGATIVE
Nitrite: NEGATIVE
Specific Gravity, Urine: 1.01 (ref 1.005–1.030)
Urobilinogen, UA: 0.2 mg/dL (ref 0.0–1.0)

## 2012-03-17 LAB — URINE MICROSCOPIC-ADD ON

## 2012-03-17 LAB — BASIC METABOLIC PANEL
Calcium: 9.3 mg/dL (ref 8.4–10.5)
GFR calc Af Amer: 78 mL/min — ABNORMAL LOW (ref >90)
GFR calc non Af Amer: 67 mL/min — ABNORMAL LOW (ref >90)
Potassium: 3.5 mEq/L (ref 3.5–5.1)
Sodium: 138 mEq/L (ref 135–145)

## 2012-03-17 LAB — POCT I-STAT 3, ART BLOOD GAS (G3+)
Acid-base deficit: 1 mmol/L (ref 0.0–2.0)
pCO2 arterial: 37.3 mmHg (ref 35.0–45.0)
pH, Arterial: 7.401 (ref 7.350–7.450)
pO2, Arterial: 67 mmHg — ABNORMAL LOW (ref 80.0–100.0)

## 2012-03-17 LAB — CBC
MCHC: 33.1 g/dL (ref 30.0–36.0)
RDW: 14.8 % (ref 11.5–15.5)

## 2012-03-17 MED ORDER — NALOXONE HCL 0.4 MG/ML IJ SOLN: 0.4000 mg | Freq: Once | INTRAMUSCULAR | Status: DC

## 2012-03-17 MED ORDER — NALOXONE HCL 0.4 MG/ML IJ SOLN
0.4000 mg | Freq: Once | INTRAMUSCULAR | Status: AC
  Administered 2012-03-17 (×2): 0.4 mg via INTRAVENOUS
  Filled 2012-03-17: qty 1

## 2012-03-17 MED ORDER — NALOXONE HCL 0.4 MG/ML IJ SOLN
INTRAMUSCULAR | Status: AC
  Administered 2012-03-17: 0.4 mg via INTRAVENOUS
  Filled 2012-03-17: qty 1

## 2012-03-17 MED ORDER — IOHEXOL 350 MG/ML SOLN
80.0000 mL | Freq: Once | INTRAVENOUS | Status: AC | PRN
  Administered 2012-03-17: 80 mL via INTRAVENOUS

## 2012-03-17 NOTE — Progress Notes (Signed)
Called by RN to assess pt. He had become less responsive, unable to open eyes, not oriented. He also has had progressive O2 needs during the day and has become more tachycardic. Had 2 percocet about 1 hour before call. 2 rounds of narcan improved things marginally but he remained disoriented to place and expressively aphasic. No other focal neurologic changes.  Will get stat HCT and angio chest as next step.  Freeman Caldron, PA-C Pager: [phone] General Trauma PA Pager: [phone]

## 2012-03-17 NOTE — Progress Notes (Addendum)
Patient moaning and shaking all over. States he is cold and in pain. Patient slow to respond. Pupils equal and reactive. Strength equal bilaterally.Temperature 98 orally, CBG 113. Gave patient 2 percocet for pain. Over next 15 minutes, patient became more disoriented, still slow to respond, trembling subsided. HR maintaining in 100 - 120's even as patient is resting. Trauma PA Dale Woburn called. Orders received.

## 2012-03-17 NOTE — Progress Notes (Signed)
Patient OOB to chair. HR in 110s. Patient's HR up to 120s when using urinal. Recovered to 95-100s when finished. Sinus rhythm noted. Derek [name]
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
## 2012-03-17 NOTE — Progress Notes (Signed)
Discussed with the PA  Marta Lamas. Gae Bon, MD, FACS [phone] Trauma Surgeon
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
## 2012-03-17 NOTE — Progress Notes (Signed)
  Echocardiogram 2D Echocardiogram has been performed.  Derek [name] 03/17/2012, 12:18 PM

## 2012-03-18 LAB — CBC WITH DIFFERENTIAL/PLATELET
Basophils Absolute: 0 10*3/uL (ref 0.0–0.1)
Eosinophils Absolute: 0.3 10*3/uL (ref 0.0–0.7)
Eosinophils Relative: 3 % (ref 0–5)
Lymphocytes Relative: 14 % (ref 12–46)
MCH: 25.3 pg — ABNORMAL LOW (ref 26.0–34.0)
MCV: 77.9 fL — ABNORMAL LOW (ref 78.0–100.0)
Platelets: 191 10*3/uL (ref 150–400)
RDW: 15.1 % (ref 11.5–15.5)
WBC: 10.3 10*3/uL (ref 4.0–10.5)

## 2012-03-18 LAB — BASIC METABOLIC PANEL
Calcium: 8.8 mg/dL (ref 8.4–10.5)
GFR calc non Af Amer: 63 mL/min — ABNORMAL LOW (ref >90)
Sodium: 137 mEq/L (ref 135–145)

## 2012-03-18 MED ORDER — TRAMADOL HCL 50 MG PO TABS
50.0000 mg | ORAL_TABLET | Freq: Four times a day (QID) | ORAL | Status: DC | PRN
Start: 2012-03-18 — End: 2012-03-19

## 2012-03-18 MED ORDER — DILTIAZEM HCL 100 MG IV SOLR
5.0000 mg/h | INTRAVENOUS | Status: DC
  Administered 2012-03-18 (×2): 10 mg/h via INTRAVENOUS
  Administered 2012-03-18: 5 mg/h via INTRAVENOUS
  Administered 2012-03-18: 10 mg/h via INTRAVENOUS
  Filled 2012-03-18: qty 100

## 2012-03-18 MED ORDER — ACETAMINOPHEN 325 MG PO TABS
650.0000 mg | ORAL_TABLET | Freq: Four times a day (QID) | ORAL | Status: DC | PRN
  Administered 2012-03-18 – 2012-03-19 (×2): 650 mg via ORAL
  Filled 2012-03-18 (×2): qty 2

## 2012-03-18 MED ORDER — ALBUMIN HUMAN 5 % IV SOLN
12.5000 g | Freq: Once | INTRAVENOUS | Status: AC
  Administered 2012-03-18: 12.5 g via INTRAVENOUS
  Filled 2012-03-18: qty 250

## 2012-03-18 MED ORDER — AMIODARONE IV BOLUS ONLY 150 MG/100ML
150.0000 mg | Freq: Once | INTRAVENOUS | Status: AC
Start: 2012-03-18 — End: 2012-03-18
  Administered 2012-03-18: 150 mg via INTRAVENOUS
  Filled 2012-03-18: qty 100

## 2012-03-18 MED ORDER — AMIODARONE IV BOLUS ONLY 150 MG/100ML
150.0000 mg | Freq: Once | INTRAVENOUS | Status: AC
  Administered 2012-03-18: 150 mg via INTRAVENOUS
  Filled 2012-03-18: qty 100

## 2012-03-18 NOTE — Progress Notes (Signed)
Pt. Was found already on home CPAP with home mask. Pt. Is tolerating CPAP well at this time. All vitals are within normal limits.

## 2012-03-18 NOTE — Clinical Social Work Note (Signed)
Clinical Social Worker spoke with patient daughter Joyce Gross) over the phone to confirm patient discharge plans.  Patient daughter states that she has confirmed with [facility] that patient may return there upon discharge.  CSW contacted facility admissions who states that they are able to extend the bed offer for when patient is medically ready for discharge.  [facility] admissions coordinator will contact patient daughter to complete necessary paperwork prior to admission.    Clinical Social Worker will continue to follow for emotional support and to facilitate patient discharge needs once medically ready.  [address] [place], [place] [phone]
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
## 2012-03-18 NOTE — Progress Notes (Signed)
Spoke with MD Katrinka Blazing and MD Janee Morn about Pt. rhythm change to SR at 1450pm. MD Katrinka Blazing D/C'd Diltiazem gtt.  Spoke to MD Janee Morn about Pt. UO decrease, meds as ordered, will continue to monitor.

## 2012-03-19 DIAGNOSIS — G4730 Sleep apnea, unspecified: Secondary | ICD-10-CM | POA: Diagnosis not present

## 2012-03-19 DIAGNOSIS — I4891 Unspecified atrial fibrillation: Secondary | ICD-10-CM | POA: Diagnosis not present

## 2012-03-19 DIAGNOSIS — F0390 Unspecified dementia without behavioral disturbance: Secondary | ICD-10-CM | POA: Diagnosis not present

## 2012-03-19 DIAGNOSIS — I498 Other specified cardiac arrhythmias: Secondary | ICD-10-CM | POA: Diagnosis not present

## 2012-03-19 DIAGNOSIS — S42009A Fracture of unspecified part of unspecified clavicle, initial encounter for closed fracture: Secondary | ICD-10-CM | POA: Diagnosis not present

## 2012-03-19 DIAGNOSIS — I6990 Unspecified sequelae of unspecified cerebrovascular disease: Secondary | ICD-10-CM | POA: Diagnosis not present

## 2012-03-19 DIAGNOSIS — I998 Other disorder of circulatory system: Secondary | ICD-10-CM | POA: Diagnosis not present

## 2012-03-19 DIAGNOSIS — S42293A Other displaced fracture of upper end of unspecified humerus, initial encounter for closed fracture: Secondary | ICD-10-CM | POA: Diagnosis not present

## 2012-03-19 DIAGNOSIS — IMO0001 Reserved for inherently not codable concepts without codable children: Secondary | ICD-10-CM | POA: Diagnosis not present

## 2012-03-19 DIAGNOSIS — K5900 Constipation, unspecified: Secondary | ICD-10-CM | POA: Diagnosis not present

## 2012-03-19 DIAGNOSIS — D649 Anemia, unspecified: Secondary | ICD-10-CM | POA: Diagnosis not present

## 2012-03-19 DIAGNOSIS — Z8673 Personal history of transient ischemic attack (TIA), and cerebral infarction without residual deficits: Secondary | ICD-10-CM | POA: Diagnosis not present

## 2012-03-19 DIAGNOSIS — Z5189 Encounter for other specified aftercare: Secondary | ICD-10-CM | POA: Diagnosis not present

## 2012-03-19 DIAGNOSIS — I359 Nonrheumatic aortic valve disorder, unspecified: Secondary | ICD-10-CM | POA: Diagnosis not present

## 2012-03-19 DIAGNOSIS — I10 Essential (primary) hypertension: Secondary | ICD-10-CM | POA: Diagnosis not present

## 2012-03-19 DIAGNOSIS — I495 Sick sinus syndrome: Secondary | ICD-10-CM | POA: Diagnosis not present

## 2012-03-19 LAB — BASIC METABOLIC PANEL
BUN: 20 mg/dL (ref 6–23)
CO2: 25 mEq/L (ref 19–32)
Calcium: 9.5 mg/dL (ref 8.4–10.5)
Chloride: 100 mEq/L (ref 96–112)
Creatinine, Ser: 1.15 mg/dL (ref 0.50–1.35)
GFR calc Af Amer: 66 mL/min — ABNORMAL LOW (ref >90)
Glucose, Bld: 141 mg/dL — ABNORMAL HIGH (ref 70–99)
Potassium: 3.3 mEq/L — ABNORMAL LOW (ref 3.5–5.1)
Sodium: 139 mEq/L (ref 135–145)

## 2012-03-19 LAB — CBC
Platelets: 201 10*3/uL (ref 150–400)
RDW: 14.8 % (ref 11.5–15.5)
WBC: 7.8 10*3/uL (ref 4.0–10.5)

## 2012-03-19 MED ORDER — POTASSIUM CHLORIDE CRYS ER 20 MEQ PO TBCR
40.0000 meq | EXTENDED_RELEASE_TABLET | ORAL | Status: AC
  Administered 2012-03-19 (×2): 40 meq via ORAL
  Filled 2012-03-19 (×2): qty 2

## 2012-03-19 MED ORDER — AMLODIPINE BESYLATE 10 MG PO TABS
10.0000 mg | ORAL_TABLET | Freq: Every day | ORAL | Status: DC
  Administered 2012-03-19: 10 mg via ORAL
  Filled 2012-03-19: qty 1

## 2012-03-19 MED ORDER — POTASSIUM CHLORIDE ER 10 MEQ PO TBCR: 20.0000 meq | EXTENDED_RELEASE_TABLET | Freq: Two times a day (BID) | ORAL | Status: DC

## 2012-03-19 MED ORDER — TRAMADOL HCL 50 MG PO TABS: 50.0000 mg | ORAL_TABLET | Freq: Four times a day (QID) | ORAL | Status: AC | PRN
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
## 2012-03-19 NOTE — Progress Notes (Signed)
Orthopedic Tech Progress Note Patient Details:  Derek [name] 10-06-1926 [id number]  Patient ID: [name], male   DOB: December 15, 1926, 78 y.o.   MRN: [id number] Confirmed with pt RN that pt has sling immobilizer.  Leo Grosser T 03/19/2012, 9:43 AM

## 2012-03-19 NOTE — Discharge Summary (Signed)
Derek Gilmore, MD, MPH, FACS Pager: [phone]  
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
## 2012-03-19 NOTE — Clinical Social Work Note (Signed)
Clinical Social Worker facilitated patient discharge including contacting facility and family to confirm patient discharge plans.  Patient and patient daughter agreeable with transfer to [facility] - facility to complete admission paperwork at bedside.  Patient family to provide transportation for patient to [facility].  RN to call report prior to discharge.    Clinical Social Worker will sign off for now as social work intervention is no longer needed. Please consult [place] again if new need arises.  [address] [place], [place] [phone]
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
## 2012-03-19 NOTE — Progress Notes (Addendum)
Pt. Discharged 03/19/2012  2:13 PM Discharge instructions reviewed with patient/family. Patient/family verbalized understanding. All Rx's given. Questions answered as needed. Pt. Discharged to SNF with family/self. Awaiting Daughter to pick her to SNF Elease Hashimoto RN

## 2012-03-19 NOTE — Progress Notes (Signed)
UR complete 

## 2012-03-19 NOTE — Progress Notes (Signed)
Patient Name: Derek [name] Date of Encounter: 03/19/2012    SUBJECTIVE: Has not had cardiac complaints.  TELEMETRY:  SR with no pauses or HR < 50 bpm. No recurrent atrial fib.: Filed Vitals:   03/19/12 0930 03/19/12 1000 03/19/12 1100 03/19/12 1138  BP: 136/60 151/74 120/59 165/83  Pulse: 87 85 69 64  Temp:    98.1 F (36.7 C)  TempSrc:    Oral  Resp: 18 16 15 18   Height:      Weight:      SpO2: 95% 94% 94% 93%    Intake/Output Summary (Last 24 hours) at 03/19/12 1246 Last data filed at 03/19/12 1000  Gross per 24 hour  Intake    930 ml  Output   2500 ml  Net  -1570 ml    LABS: Basic Metabolic Panel:  Basename 03/19/12 0345 03/18/12 0343  NA 137 137  K 3.3* 3.7  CL 100 101  CO2 26 25  GLUCOSE 135* 113*  BUN 19 15  CREATININE 1.15 1.06  CALCIUM 9.2 8.8  MG -- --  PHOS -- --   CBC:  Basename 03/19/12 0345 03/18/12 0343  WBC 7.8 10.3  NEUTROABS -- 7.9*  HGB 10.5* 10.9*  HCT 31.8* 33.5*  MCV 77.4* 77.9*  PLT 201 191   Radiology/Studies:  No new.  Physical Exam: Blood pressure 165/83, pulse 64, temperature 98.1 F (36.7 C), temperature source Oral, resp. rate 18, height 5\' 9"  (1.753 m), weight 82.7 kg (182 lb 5.1 oz), SpO2 93.00%. Weight change: 0.5 kg (1 lb 1.6 oz)   No rub  ASSESSMENT:  1. Sinus node dysfunction with asymptomatic atrial fibrillation this admission. Raises question of whether he has unrecognized episodes as an outpatient.  2. Hypertension  3. Hypokalemia   Plan:  1. Will arrange OP 30 day telemetry  2. Due to prior bradycardia, will not add beta blocker or diltiazem. Would continue to use Amlodipine for BP.  3. Replete the potassium  Signed, Lesleigh Noe 03/19/2012, 12:46 PM

## 2012-03-19 NOTE — Discharge Instructions (Signed)
Keep left arm in sling. No lifting with left arm.

## 2012-03-19 NOTE — Progress Notes (Signed)
Pt transferred to 2035 while on the monitor, tolerated well.  Report given to [facility].

## 2012-03-20 LAB — CULTURE, RESPIRATORY W GRAM STAIN

## 2012-03-24 DIAGNOSIS — I10 Essential (primary) hypertension: Secondary | ICD-10-CM | POA: Diagnosis not present

## 2012-03-24 DIAGNOSIS — S42009A Fracture of unspecified part of unspecified clavicle, initial encounter for closed fracture: Secondary | ICD-10-CM | POA: Diagnosis not present

## 2012-03-24 DIAGNOSIS — F0390 Unspecified dementia without behavioral disturbance: Secondary | ICD-10-CM | POA: Diagnosis not present

## 2012-03-24 DIAGNOSIS — I6990 Unspecified sequelae of unspecified cerebrovascular disease: Secondary | ICD-10-CM | POA: Diagnosis not present

## 2012-03-31 DIAGNOSIS — I4891 Unspecified atrial fibrillation: Secondary | ICD-10-CM | POA: Diagnosis not present

## 2012-03-31 DIAGNOSIS — I10 Essential (primary) hypertension: Secondary | ICD-10-CM | POA: Diagnosis not present

## 2012-03-31 DIAGNOSIS — I495 Sick sinus syndrome: Secondary | ICD-10-CM | POA: Diagnosis not present

## 2012-04-05 DIAGNOSIS — S42293A Other displaced fracture of upper end of unspecified humerus, initial encounter for closed fracture: Secondary | ICD-10-CM | POA: Diagnosis not present

## 2012-04-30 DIAGNOSIS — IMO0001 Reserved for inherently not codable concepts without codable children: Secondary | ICD-10-CM | POA: Diagnosis not present

## 2012-04-30 DIAGNOSIS — S270XXA Traumatic pneumothorax, initial encounter: Secondary | ICD-10-CM | POA: Diagnosis not present

## 2012-04-30 DIAGNOSIS — I10 Essential (primary) hypertension: Secondary | ICD-10-CM | POA: Diagnosis not present

## 2012-04-30 DIAGNOSIS — F0390 Unspecified dementia without behavioral disturbance: Secondary | ICD-10-CM | POA: Diagnosis not present

## 2012-04-30 DIAGNOSIS — S42309D Unspecified fracture of shaft of humerus, unspecified arm, subsequent encounter for fracture with routine healing: Secondary | ICD-10-CM | POA: Diagnosis not present

## 2012-05-04 DIAGNOSIS — S42293A Other displaced fracture of upper end of unspecified humerus, initial encounter for closed fracture: Secondary | ICD-10-CM | POA: Diagnosis not present

## 2012-05-04 DIAGNOSIS — S52123A Displaced fracture of head of unspecified radius, initial encounter for closed fracture: Secondary | ICD-10-CM | POA: Diagnosis not present

## 2012-05-25 DIAGNOSIS — I4891 Unspecified atrial fibrillation: Secondary | ICD-10-CM | POA: Diagnosis not present

## 2012-06-15 DIAGNOSIS — I679 Cerebrovascular disease, unspecified: Secondary | ICD-10-CM | POA: Diagnosis not present

## 2012-06-15 DIAGNOSIS — F079 Unspecified personality and behavioral disorder due to known physiological condition: Secondary | ICD-10-CM | POA: Diagnosis not present

## 2012-06-17 DIAGNOSIS — B351 Tinea unguium: Secondary | ICD-10-CM | POA: Diagnosis not present

## 2012-06-17 DIAGNOSIS — M79609 Pain in unspecified limb: Secondary | ICD-10-CM | POA: Diagnosis not present

## 2012-08-30 DIAGNOSIS — R079 Chest pain, unspecified: Secondary | ICD-10-CM | POA: Diagnosis not present

## 2012-09-15 DIAGNOSIS — M79609 Pain in unspecified limb: Secondary | ICD-10-CM | POA: Diagnosis not present

## 2012-09-15 DIAGNOSIS — B351 Tinea unguium: Secondary | ICD-10-CM | POA: Diagnosis not present

## 2012-11-15 DIAGNOSIS — B309 Viral conjunctivitis, unspecified: Secondary | ICD-10-CM | POA: Diagnosis not present

## 2012-11-15 DIAGNOSIS — H169 Unspecified keratitis: Secondary | ICD-10-CM | POA: Diagnosis not present

## 2012-11-17 DIAGNOSIS — B309 Viral conjunctivitis, unspecified: Secondary | ICD-10-CM | POA: Diagnosis not present

## 2012-11-22 DIAGNOSIS — B309 Viral conjunctivitis, unspecified: Secondary | ICD-10-CM | POA: Diagnosis not present

## 2012-11-29 DIAGNOSIS — B309 Viral conjunctivitis, unspecified: Secondary | ICD-10-CM | POA: Diagnosis not present

## 2012-11-29 DIAGNOSIS — H04129 Dry eye syndrome of unspecified lacrimal gland: Secondary | ICD-10-CM | POA: Diagnosis not present

## 2012-11-29 DIAGNOSIS — H01009 Unspecified blepharitis unspecified eye, unspecified eyelid: Secondary | ICD-10-CM | POA: Diagnosis not present

## 2012-11-29 DIAGNOSIS — H4011X0 Primary open-angle glaucoma, stage unspecified: Secondary | ICD-10-CM | POA: Diagnosis not present

## 2012-12-04 DIAGNOSIS — I079 Rheumatic tricuspid valve disease, unspecified: Secondary | ICD-10-CM | POA: Diagnosis not present

## 2012-12-04 DIAGNOSIS — R0602 Shortness of breath: Secondary | ICD-10-CM | POA: Diagnosis not present

## 2012-12-04 DIAGNOSIS — R011 Cardiac murmur, unspecified: Secondary | ICD-10-CM | POA: Diagnosis not present

## 2012-12-04 DIAGNOSIS — R61 Generalized hyperhidrosis: Secondary | ICD-10-CM | POA: Diagnosis not present

## 2012-12-04 DIAGNOSIS — Z8673 Personal history of transient ischemic attack (TIA), and cerebral infarction without residual deficits: Secondary | ICD-10-CM | POA: Diagnosis not present

## 2012-12-04 DIAGNOSIS — I359 Nonrheumatic aortic valve disorder, unspecified: Secondary | ICD-10-CM | POA: Diagnosis not present

## 2012-12-04 DIAGNOSIS — R9431 Abnormal electrocardiogram [ECG] [EKG]: Secondary | ICD-10-CM | POA: Diagnosis not present

## 2012-12-04 DIAGNOSIS — R404 Transient alteration of awareness: Secondary | ICD-10-CM | POA: Diagnosis not present

## 2012-12-04 DIAGNOSIS — R55 Syncope and collapse: Secondary | ICD-10-CM | POA: Diagnosis not present

## 2012-12-04 DIAGNOSIS — I10 Essential (primary) hypertension: Secondary | ICD-10-CM | POA: Diagnosis not present

## 2012-12-04 DIAGNOSIS — I059 Rheumatic mitral valve disease, unspecified: Secondary | ICD-10-CM | POA: Diagnosis not present

## 2012-12-04 DIAGNOSIS — F411 Generalized anxiety disorder: Secondary | ICD-10-CM | POA: Diagnosis not present

## 2012-12-04 DIAGNOSIS — K219 Gastro-esophageal reflux disease without esophagitis: Secondary | ICD-10-CM | POA: Diagnosis present

## 2012-12-04 DIAGNOSIS — R0989 Other specified symptoms and signs involving the circulatory and respiratory systems: Secondary | ICD-10-CM | POA: Diagnosis not present

## 2012-12-07 DIAGNOSIS — R55 Syncope and collapse: Secondary | ICD-10-CM | POA: Diagnosis not present

## 2012-12-08 DIAGNOSIS — R55 Syncope and collapse: Secondary | ICD-10-CM | POA: Diagnosis not present

## 2012-12-20 DIAGNOSIS — H10409 Unspecified chronic conjunctivitis, unspecified eye: Secondary | ICD-10-CM | POA: Diagnosis not present

## 2012-12-20 DIAGNOSIS — H169 Unspecified keratitis: Secondary | ICD-10-CM | POA: Diagnosis not present

## 2012-12-22 DIAGNOSIS — H4011X0 Primary open-angle glaucoma, stage unspecified: Secondary | ICD-10-CM | POA: Diagnosis not present

## 2012-12-22 DIAGNOSIS — H169 Unspecified keratitis: Secondary | ICD-10-CM | POA: Diagnosis not present

## 2012-12-22 DIAGNOSIS — H01009 Unspecified blepharitis unspecified eye, unspecified eyelid: Secondary | ICD-10-CM | POA: Diagnosis not present

## 2012-12-22 DIAGNOSIS — B309 Viral conjunctivitis, unspecified: Secondary | ICD-10-CM | POA: Diagnosis not present

## 2012-12-29 DIAGNOSIS — H01009 Unspecified blepharitis unspecified eye, unspecified eyelid: Secondary | ICD-10-CM | POA: Diagnosis not present

## 2012-12-29 DIAGNOSIS — H10409 Unspecified chronic conjunctivitis, unspecified eye: Secondary | ICD-10-CM | POA: Diagnosis not present

## 2012-12-29 DIAGNOSIS — H4011X0 Primary open-angle glaucoma, stage unspecified: Secondary | ICD-10-CM | POA: Diagnosis not present

## 2012-12-29 DIAGNOSIS — H169 Unspecified keratitis: Secondary | ICD-10-CM | POA: Diagnosis not present

## 2013-01-19 DIAGNOSIS — F0390 Unspecified dementia without behavioral disturbance: Secondary | ICD-10-CM | POA: Diagnosis not present

## 2013-01-19 DIAGNOSIS — H3530 Unspecified macular degeneration: Secondary | ICD-10-CM | POA: Diagnosis not present

## 2013-01-19 DIAGNOSIS — I4891 Unspecified atrial fibrillation: Secondary | ICD-10-CM | POA: Diagnosis not present

## 2013-01-19 DIAGNOSIS — I10 Essential (primary) hypertension: Secondary | ICD-10-CM | POA: Diagnosis not present

## 2013-01-19 DIAGNOSIS — H169 Unspecified keratitis: Secondary | ICD-10-CM | POA: Diagnosis not present

## 2013-01-19 DIAGNOSIS — Z961 Presence of intraocular lens: Secondary | ICD-10-CM | POA: Diagnosis not present

## 2013-01-19 DIAGNOSIS — H10409 Unspecified chronic conjunctivitis, unspecified eye: Secondary | ICD-10-CM | POA: Diagnosis not present

## 2013-01-19 DIAGNOSIS — F329 Major depressive disorder, single episode, unspecified: Secondary | ICD-10-CM | POA: Diagnosis not present

## 2013-01-19 DIAGNOSIS — R55 Syncope and collapse: Secondary | ICD-10-CM | POA: Diagnosis not present

## 2013-02-02 DIAGNOSIS — H35379 Puckering of macula, unspecified eye: Secondary | ICD-10-CM | POA: Diagnosis not present

## 2013-02-02 DIAGNOSIS — H43819 Vitreous degeneration, unspecified eye: Secondary | ICD-10-CM | POA: Diagnosis not present

## 2013-02-02 DIAGNOSIS — H353190 Nonexudative age-related macular degeneration, unspecified eye, stage unspecified: Secondary | ICD-10-CM | POA: Diagnosis not present

## 2013-02-16 ENCOUNTER — Telehealth: Payer: Self-pay | Admitting: Neurology

## 2013-02-16 DIAGNOSIS — I4891 Unspecified atrial fibrillation: Secondary | ICD-10-CM | POA: Diagnosis not present

## 2013-02-16 DIAGNOSIS — I10 Essential (primary) hypertension: Secondary | ICD-10-CM | POA: Diagnosis not present

## 2013-02-16 DIAGNOSIS — F0390 Unspecified dementia without behavioral disturbance: Secondary | ICD-10-CM | POA: Diagnosis not present

## 2013-02-16 DIAGNOSIS — G459 Transient cerebral ischemic attack, unspecified: Secondary | ICD-10-CM | POA: Diagnosis not present

## 2013-02-16 NOTE — Telephone Encounter (Signed)
[place] [employer], the patient's daughter, is calling to tell us patient [place] needs to see Dr. Anne Hahn.  She tells me her Dad has seen Dr. Anne Hahn in the past.  He is having more confusion, babbling (like TIA effects) when he talks and decreased energy. She would like a call back at [phone]. She asks if someone can call today?

## 2013-02-17 NOTE — Telephone Encounter (Signed)
Called daughter. Left vmail message received and forwarded.

## 2013-02-18 DIAGNOSIS — R55 Syncope and collapse: Secondary | ICD-10-CM | POA: Diagnosis not present

## 2013-02-18 DIAGNOSIS — F0390 Unspecified dementia without behavioral disturbance: Secondary | ICD-10-CM | POA: Diagnosis not present

## 2013-02-18 DIAGNOSIS — R4182 Altered mental status, unspecified: Secondary | ICD-10-CM | POA: Diagnosis not present

## 2013-02-24 ENCOUNTER — Encounter: Payer: Self-pay | Admitting: Neurology

## 2013-03-03 ENCOUNTER — Encounter: Payer: Self-pay | Admitting: Neurology

## 2013-03-04 ENCOUNTER — Ambulatory Visit (INDEPENDENT_AMBULATORY_CARE_PROVIDER_SITE_OTHER): Payer: Medicare Other | Admitting: Neurology

## 2013-03-04 ENCOUNTER — Encounter: Payer: Self-pay | Admitting: Neurology

## 2013-03-04 VITALS — BP 111/70 | HR 68 | Temp 96.8°F | Resp 16 | Ht 68.0 in | Wt 188.0 lb

## 2013-03-04 DIAGNOSIS — G459 Transient cerebral ischemic attack, unspecified: Secondary | ICD-10-CM | POA: Diagnosis not present

## 2013-03-04 DIAGNOSIS — G4733 Obstructive sleep apnea (adult) (pediatric): Secondary | ICD-10-CM | POA: Diagnosis not present

## 2013-03-04 DIAGNOSIS — G4731 Primary central sleep apnea: Secondary | ICD-10-CM

## 2013-03-04 DIAGNOSIS — I4891 Unspecified atrial fibrillation: Secondary | ICD-10-CM | POA: Diagnosis not present

## 2013-03-04 NOTE — Patient Instructions (Signed)
Atrial Fibrillation Your caregiver has diagnosed you with atrial fibrillation (AFib). The heart normally beats very regularly; AFib is a type of irregular heartbeat. The heart rate may be faster or slower than normal. This can prevent your heart from pumping as well as it should. AFib can be constant (chronic) or intermittent (paroxysmal). CAUSES  Atrial fibrillation may be caused by:  Heart disease, including heart attack, coronary artery disease, heart failure, diseases of the heart valves, and others.  Blood clot in the lungs (pulmonary embolism).  Pneumonia or other infections.  Chronic lung disease.  Thyroid disease.  Toxins. These include alcohol, some medications (such as decongestant medications or diet pills), and caffeine. In some people, no cause for AFib can be found. This is referred to as Lone Atrial Fibrillation. SYMPTOMS   Palpitations or a fluttering in your chest.  A vague sense of chest discomfort.  Shortness of breath.  Sudden onset of lightheadedness or weakness. Sometimes, the first sign of AFib can be a complication of the condition. This could be a stroke or heart failure. DIAGNOSIS  Your description of your condition may make your caregiver suspicious of atrial fibrillation. Your caregiver will examine your pulse to determine if fibrillation is present. An EKG (electrocardiogram) will confirm the diagnosis. Further testing may help determine what caused you to have atrial fibrillation. This may include chest x-ray, echocardiogram, blood tests, or CT scans. PREVENTION  If you have previously had atrial fibrillation, your caregiver may advise you to avoid substances known to cause the condition (such as stimulant medications, and possibly caffeine or alcohol). You may be advised to use medications to prevent recurrence. Proper treatment of any underlying condition is important to help prevent recurrence. PROGNOSIS  Atrial fibrillation does tend to become a  chronic condition over time. It can cause significant complications (see below). Atrial fibrillation is not usually immediately life-threatening, but it can shorten your life expectancy. This seems to be worse in women. If you have lone atrial fibrillation and are under 60 years old, the risk of complications is very low, and life expectancy is not shortened. RISKS AND COMPLICATIONS  Complications of atrial fibrillation can include stroke, chest pain, and heart failure. Your caregiver will recommend treatments for the atrial fibrillation, as well as for any underlying conditions, to help minimize risk of complications. TREATMENT  Treatment for AFib is divided into several categories:  Treatment of any underlying condition.  Converting you out of AFib into a regular (sinus) rhythm.  Controlling rapid heart rate.  Prevention of blood clots and stroke. Medications and procedures are available to convert your atrial fibrillation to sinus rhythm. However, recent studies have shown that this may not offer you any advantage, and cardiac experts are continuing research and debate on this topic. More important is controlling your rapid heartbeat. The rapid heartbeat causes more symptoms, and places strain on your heart. Your caregiver will advise you on the use of medications that can control your heart rate. Atrial fibrillation is a strong stroke risk. You can lessen this risk by taking blood thinning medications such as Coumadin (warfarin), or sometimes aspirin. These medications need close monitoring by your caregiver. Over-medication can cause bleeding. Too little medication may not protect against stroke. HOME CARE INSTRUCTIONS   If your caregiver prescribed medicine to make your heartbeat more normally, take as directed.  If blood thinners were prescribed by your caregiver, take EXACTLY as directed.  Perform blood tests EXACTLY as directed.  Quit smoking. Smoking increases your cardiac and   lung  (pulmonary) risks.  DO NOT drink alcohol.  DO NOT drink caffeinated drinks (e.g. coffee, soda, chocolate, and leaf teas). You may drink decaffeinated coffee, soda or tea.  If you are overweight, you should choose a reduced calorie diet to lose weight. Please see a registered dietitian if you need more information about healthy weight loss. DO NOT USE DIET PILLS as they may aggravate heart problems.  If you have other heart problems that are causing AFib, you may need to eat a low salt, fat, and cholesterol diet. Your caregiver will tell you if this is necessary.  Exercise every day to improve your physical fitness. Stay active unless advised otherwise.  If your caregiver has given you a follow-up appointment, it is very important to keep that appointment. Not keeping the appointment could result in heart failure or stroke. If there is any problem keeping the appointment, you must call back to this facility for assistance. SEEK MEDICAL CARE IF:  You notice a change in the rate, rhythm or strength of your heartbeat.  You develop an infection or any other change in your overall health status. SEEK IMMEDIATE MEDICAL CARE IF:   You develop chest pain, abdominal pain, sweating, weakness or feel sick to your stomach (nausea).  You develop shortness of breath.  You develop swollen feet and ankles.  You develop dizziness, numbness, or weakness of your face or limbs, or any change in vision or speech. MAKE SURE YOU:   Understand these instructions.  Will watch your condition.  Will get help right away if you are not doing well or get worse. Document Released: 08/25/2005 Document Revised: 11/17/2011 Document Reviewed: 04/03/2010 [facility] Patient Information 2014 [place], [place]. CPAP and BIPAP CPAP and BIPAP are methods of helping you breathe. CPAP stands for "continuous positive airway pressure." BIPAP stands for "bi-level positive airway pressure." Both CPAP and BIPAP are provided by a  small machine with a flexible plastic tube that attaches to a plastic mask that goes over your nose or mouth. Air is blown into your air passages through your nose or mouth. This helps to keep your airways open and helps to keep you breathing well. The amount of pressure that is used to blow the air into your air passages can be set on the machine. The pressure setting is based on your needs. With CPAP, the amount of pressure stays the same while you breathe in and out. With BIPAP, the amount of pressure changes when you inhale and exhale. Your caregiver will recommend whether CPAP or BIPAP would be more helpful for you.  CPAP and BIPAP can be helpful for both adults and children with:  Sleep apnea.  Chronic Obstructive Pulmonary Disease (COPD), a condition like emphysema.  Diseases which weaken the muscles of the chest such as muscular dystrophy or neurological diseases.  Other problems that cause breathing to be weak or difficult. USE OF CPAP OR BIPAP The respiratory therapist or technician will help you get used to wearing the mask. Some people feel claustrophobic (a trapped or closed in feeling) at first, because the mask needs to be fairly snug on your face.   It may help you to get used to the mask gradually, by first holding the mask loosely over your nose or mouth using a low pressure setting on the machine. Gradually the mask can be applied more snugly with increased pressure. You can also gradually increase the amount of time the mask is used.  People with sleep apnea will use  the mask and machine at night when they are sleeping. Others, like those with ALS or other breathing difficulties, may need the CPAP or BIPAP all the time.  If the first mask you try does not fit well, or is uncomfortable, there are other types and sizes that can be tried.  If you tend to breathe through your mouth, a chin strap may be applied to help keep your mouth closed (if you are using a nasal mask).  The  CPAP and BIPAP machines have alarms that may sound if the mask comes off or develops a leak.  You should not eat or drink while the CPAP or BIPAP is on. Food or fluids could get pushed into your lungs by the pressure of the CPAP or BIPAP. Sometimes CPAP or BIPAP machines are ordered for home use. If you are going to use the CPAP or BIPAP machine at home, follow these instructions  CPAP or BIPAP machines can be rented or purchased through home health care companies. There are many different brands of machines available. If you rent a machine before purchasing you may find which particular machine works well for you.  Ask questions if there is something you do not understand when picking out your machine.  Place your CPAP or BIPAP machine on a secure table or stand near an electrical outlet.  Know where the On/Off switch is.  Follow your doctor's instructions for how to set the pressure on your machine and when you should use it.  Do not smoke! Tobacco smoke residue can damage the machine. SEEK IMMEDIATE MEDICAL CARE IF:   You have redness or open areas around your nose or mouth.  You have trouble operating the CPAP or BIPAP machine.  You cannot tolerate wearing the CPAP or BIPAP mask.  You have any questions or concerns. Document Released: 05/23/2004 Document Revised: 11/17/2011 Document Reviewed: 08/22/2008 [facility] Patient Information 2014 [place], [place]. Sleep Apnea  Sleep apnea is a sleep disorder characterized by abnormal pauses in breathing while you sleep. When your breathing pauses, the level of oxygen in your blood decreases. This causes you to move out of deep sleep and into light sleep. As a result, your quality of sleep is poor, and the system that carries your blood throughout your body (cardiovascular system) experiences stress. If sleep apnea remains untreated, the following conditions can develop:  High blood pressure (hypertension).  Coronary artery  disease.  Inability to achieve or maintain an erection (impotence).  Impairment of your thought process (cognitive dysfunction). There are three types of sleep apnea: 1. Obstructive sleep apnea Pauses in breathing during sleep because of a blocked airway. 2. Central sleep apnea Pauses in breathing during sleep because the area of the brain that controls your breathing does not send the correct signals to the muscles that control breathing. 3. Mixed sleep apnea A combination of both obstructive and central sleep apnea. RISK FACTORS The following risk factors can increase your risk of developing sleep apnea:  Being overweight.  Smoking.  Having narrow passages in your nose and throat.  Being of older age.  Being male.  Alcohol use.  Sedative and tranquilizer use.  Ethnicity. Among individuals younger than 35 years, African Americans are at increased risk of sleep apnea. SYMPTOMS   Difficulty staying asleep.  Daytime sleepiness and fatigue.  Loss of energy.  Irritability.  Loud, heavy snoring.  Morning headaches.  Trouble concentrating.  Forgetfulness.  Decreased interest in sex. DIAGNOSIS  In order to diagnose sleep apnea,  your caregiver will perform a physical examination. Your caregiver may suggest that you take a home sleep test. Your caregiver may also recommend that you spend the night in a sleep lab. In the sleep lab, several monitors record information about your heart, lungs, and brain while you sleep. Your leg and arm movements and blood oxygen level are also recorded. TREATMENT The following actions may help to resolve mild sleep apnea:  Sleeping on your side.   Using a decongestant if you have nasal congestion.   Avoiding the use of depressants, including alcohol, sedatives, and narcotics.   Losing weight and modifying your diet if you are overweight. There also are devices and treatments to help open your airway:  Oral appliances. These are  custom-made mouthpieces that shift your lower jaw forward and slightly open your bite. This opens your airway.  Devices that create positive airway pressure. This positive pressure "splints" your airway open to help you breathe better during sleep. The following devices create positive airway pressure:  Continuous positive airway pressure (CPAP) device. The CPAP device creates a continuous level of air pressure with an air pump. The air is delivered to your airway through a mask while you sleep. This continuous pressure keeps your airway open.  Nasal expiratory positive airway pressure (EPAP) device. The EPAP device creates positive air pressure as you exhale. The device consists of single-use valves, which are inserted into each nostril and held in place by adhesive. The valves create very little resistance when you inhale but create much more resistance when you exhale. That increased resistance creates the positive airway pressure. This positive pressure while you exhale keeps your airway open, making it easier to breath when you inhale again.  Bilevel positive airway pressure (BPAP) device. The BPAP device is used mainly in patients with central sleep apnea. This device is similar to the CPAP device because it also uses an air pump to deliver continuous air pressure through a mask. However, with the BPAP machine, the pressure is set at two different levels. The pressure when you exhale is lower than the pressure when you inhale.  Surgery. Typically, surgery is only done if you cannot comply with less invasive treatments or if the less invasive treatments do not improve your condition. Surgery involves removing excess tissue in your airway to create a wider passage way. Document Released: 08/15/2002 Document Revised: 02/24/2012 Document Reviewed: 01/01/2012 [facility] Patient Information 2014 [place], [place].

## 2013-03-04 NOTE — Progress Notes (Addendum)
[facility]  Provider:  Dr Wilfrid Hyser Referring Provider: Katy Apo, MD Primary Care Physician:  Katy Apo, MD  Chief Complaint  Patient presents with  . Follow-up    per [facility], CPAP problems, rm 10    HPI:  Derek [name] is a 79 y.o. male here as a referral from Dr. Nehemiah Settle for Mr. Derek [name] is a meanwhile 79 year old right-handed Caucasian male who presents today in the presence of his daughter. He was last seen by Dr. Anne Hahn in October 2013 memory disturbance during that visit Dr. Anne Hahn mentioned that the patient will have cerebrovascular disease also had not been followed up for his sleep apnea in a while. He endorsed  the Epworth Sleepiness Scale here today at 17 points, FSS at 40 points.   The patient underwent an adapt  SV titration on 08/15/2005 this after a CPAP and BiPAP titration had failed ( 07-15-05). The patient had continued oxygen desaturations and central apneas , while  the obstructive apneas were resolved at baseline I was concerned  his apnea type was more  complex and not straightforward obstructive apnea.  The patient reached an AHI of 0.9 during the adductus the titration he was bradycardic several times at night as it was tablet of 34.  This EEP was 6 cm,  and is pressure support 5 cm minimum and 10 cm at maximum. At the time he was followed by Dr. Catarina Hartshorn.  His AHI was 52.3 and the preceding baseline study 27 of these apneas were obstructive and 270  central apneas . 02  nadir was 71%  I was able today to review a download from the CPAP machine dated 02/11/2013 and forwarded to me from [facility] and the [facility].  The machine's  memory function may not be as good anymore:  his median daily usage is indicated at 3 hours and 58 minutes, and  the mask leaks. There is a high peak inspiratory pressure of 18 cm water which I doubt a gentleman of his build would need. The download does not contain A H. I data.   During last he is  visit with his primary I male he addressed, the patient was already placed on Aricept through his primary care physician at the time Dr. Sherlynn Stalls. His Mini-Mental status short 28/30 points the patient was able to name 13 animals in 30 seconds he had an entirely nonfocal neurologic exam. He had multiple strokes he reported, but no lasting impairment physically. Last TIA was march 28 th 2014.  He was worked up  at [facility] and diagnosed  with atrial  fib.    The patient tries to go to bed at 9:00 he often falls asleep watching TV much earlier on his living room couch. Takes  multiple , unplanned  naps during the day, he rises between 5 and 6 AM, he estimates his hours of sleep at night about 6 hours, and he has multiple bathroom breaks, 4-5 times.   The patient denies any skull or facial bone surgeries nasal septum correction is no retrognathia and nor dental device or retainer for the treatment of snoring or apnea.  Snoring is loud , even with the machine.  There is no family history of an inherited sleep disorder .his daughter snores, his 68 year old son is alcoholic.     And endorsed today the Epworth sleepiness score at 17 points and her fatigue severity score at 40 points , there is no elevated depression score -  GDS 5 points.    Review of Systems: Out of a complete 14 system review, the patient complains of only the following symptoms, and all other reviewed systems are negative. Epworth  17 points.    History   Social History  . Marital Status: Married    Spouse Name: N/A    Number of Children: 2  . Years of Education: IT trainer   Occupational History  . retired    Social History Main Topics  . Smoking status: Never Smoker   . Smokeless tobacco: Not on file  . Alcohol Use: Yes     Comment: occasionally  . Drug Use: No  . Sexually Active: Not on file   Other Topics Concern  . Not on file   Social History Narrative  . No narrative on file    Family History  Problem Relation  Age of Onset  . Diabetes Mother   . Diabetes Father   . Cancer - Lung Brother   . Gait disorder Brother     Past Medical History  Diagnosis Date  . CVA (cerebral vascular accident)   . Broken shoulder   . Cerebrovascular disease   . Sleep apnea   . Shingles     left chest  . Compression fracture   . Hypertension   . Pneumonia   . Atrial fibrillation   . Clavicle fracture     Past Surgical History  Procedure Laterality Date  . Tonsillectomy    . Cataract extraction Bilateral   . Kyphoplasty    . Shoulder surgery Left     Current Outpatient Prescriptions  Medication Sig Dispense Refill  . amLODipine (NORVASC) 10 MG tablet Take 10 mg by mouth daily.      Marland Kitchen apixaban (ELIQUIS) 5 MG TABS tablet Take by mouth 2 (two) times daily.      . Cholecalciferol (VITAMIN D PO) Take 1,000 Units by mouth 2 (two) times daily.       . Coenzyme Q10 (COQ10) 100 MG CAPS Take by mouth daily.      Marland Kitchen donepezil (ARICEPT) 10 MG tablet Take 10 mg by mouth at bedtime.      . Multiple Vitamins-Minerals (CENTRUM SILVER PO) Take by mouth daily.      . Omeprazole Magnesium (PRILOSEC OTC PO) Take by mouth daily.       No current facility-administered medications for this visit.    Allergies as of 03/04/2013 - Review Complete 03/04/2013  Allergen Reaction Noted  . Augmentin (amoxicillin-pot clavulanate)  03/03/2013  . Penicillins  03/14/2012  . Sulfur Rash 03/14/2012    Vitals: BP 111/70  Pulse 68  Temp(Src) 96.8 F (36 C) (Oral)  Resp 16  Ht 5\' 8"  (1.727 m)  Wt 188 lb (85.276 kg)  BMI 28.59 kg/m2 Last Weight:  Wt Readings from Last 1 Encounters:  03/04/13 188 lb (85.276 kg)   Last Height:   Ht Readings from Last 1 Encounters:  03/04/13 5\' 8"  (1.727 m)     Physical exam:  General: The patient is awake, alert and appears not in acute distress. The patient is well groomed. Head: Normocephalic, atraumatic. Neck is supple. Mallampati  3 ,  neck circumference: 15 inches , no septal  deviation, no retrognathia.  Cardiovascular:  Regular rate and rhythm , without  murmurs or carotid bruit, and without distended neck veins. Respiratory: Lungs are clear to auscultation. Skin:  Without evidence of edema, or rash Trunk:  normal posture.  Neurologic exam : The patient is awake  and alert, oriented to place and time.  Memory subjective  described as impaired , but there is a normal attention span & concentration ability.  Speech is fluent without dysarthria, but  dysphonia . Mood and affect are depressed.   Cranial nerves: Pupils are equal and briskly reactive to light. Funduscopic exam withevidence of pallor , not  edema. Extraocular movements  in vertical and horizontal planes intact and without nystagmus. Visual fields by finger perimetry are intact. Hearing to finger rub intact.  Facial sensation intact to fine touch. Facial motor strength is symmetric and tongue and uvula move midline.  Motor exam:   Normal tone and normal muscle bulk and symmetric normal strength in all extremities.  Sensory:  Fine touch, pinprick and vibration were tested in all extremities. Proprioception  normal.  Coordination: Rapid alternating movements in the fingers/hands is tested and normal. Finger-to-nose maneuver tested and normal without evidence of ataxia, dysmetria or tremor.  Gait and station: Patient walks without assistive device and is able and assisted stool climb up to the exam table. Strength within normal limits. Stance is stable , testing is normal.  Deep tendon reflexes: in the  upper and lower extremities are symmetric and intact. Babinski downgoing.   Assessment:  After physical and neurologic examination, review of laboratory studies, sleep studies, neuro testing and pre-existing records,reviewed on the problem list.  Mr. Derek [name] does not longer feels that the CPAP or VPAP provide consent benefits for him and looking at the air the data I think it is likely it's not. I would  like for this gentleman to undergo a -re titration if possible with  one hour of baseline testing to see were his apnea is at baseline today.  He has meanwhile suffered" multiple mini strokes "", was diagnosed with atrial fibrillation and had episodic congestive heart failure. He also has hypertension:   These are all risk factors of untreated or insufficiently treated sleep apnea.  His complex apnea could have worsened his ongoing cerebral vascular disease or any degenerative CNS disorder.  I will arrange for a re\re titration study and  CC Dr Anne Hahn, , Dr.Hank Katrinka Blazing ,Dr. Renford Dills   Patient is depressed, he is not longer a driving, his son has deteriorated, his wife died.    Plan:  Treatment plan and additional workup will be reviewed under Problem List.

## 2013-03-14 ENCOUNTER — Ambulatory Visit (INDEPENDENT_AMBULATORY_CARE_PROVIDER_SITE_OTHER): Payer: Medicare Other | Admitting: Neurology

## 2013-03-14 DIAGNOSIS — G4733 Obstructive sleep apnea (adult) (pediatric): Secondary | ICD-10-CM | POA: Diagnosis not present

## 2013-03-14 DIAGNOSIS — G459 Transient cerebral ischemic attack, unspecified: Secondary | ICD-10-CM

## 2013-03-14 DIAGNOSIS — R0609 Other forms of dyspnea: Secondary | ICD-10-CM

## 2013-03-14 DIAGNOSIS — I10 Essential (primary) hypertension: Secondary | ICD-10-CM | POA: Diagnosis not present

## 2013-03-14 DIAGNOSIS — R0989 Other specified symptoms and signs involving the circulatory and respiratory systems: Secondary | ICD-10-CM | POA: Diagnosis not present

## 2013-03-14 DIAGNOSIS — G4731 Primary central sleep apnea: Secondary | ICD-10-CM

## 2013-03-14 DIAGNOSIS — I4891 Unspecified atrial fibrillation: Secondary | ICD-10-CM

## 2013-03-16 DIAGNOSIS — G459 Transient cerebral ischemic attack, unspecified: Secondary | ICD-10-CM | POA: Diagnosis not present

## 2013-03-25 ENCOUNTER — Other Ambulatory Visit: Payer: Self-pay | Admitting: Neurology

## 2013-03-25 DIAGNOSIS — G4733 Obstructive sleep apnea (adult) (pediatric): Secondary | ICD-10-CM

## 2013-03-25 DIAGNOSIS — G4737 Central sleep apnea in conditions classified elsewhere: Secondary | ICD-10-CM

## 2013-03-28 DIAGNOSIS — G4730 Sleep apnea, unspecified: Secondary | ICD-10-CM | POA: Diagnosis not present

## 2013-03-28 DIAGNOSIS — R55 Syncope and collapse: Secondary | ICD-10-CM | POA: Diagnosis not present

## 2013-03-28 DIAGNOSIS — I10 Essential (primary) hypertension: Secondary | ICD-10-CM | POA: Diagnosis not present

## 2013-03-28 DIAGNOSIS — F0390 Unspecified dementia without behavioral disturbance: Secondary | ICD-10-CM | POA: Diagnosis not present

## 2013-03-29 ENCOUNTER — Telehealth: Payer: Self-pay | Admitting: Neurology

## 2013-03-29 DIAGNOSIS — R0681 Apnea, not elsewhere classified: Secondary | ICD-10-CM

## 2013-03-30 ENCOUNTER — Ambulatory Visit (INDEPENDENT_AMBULATORY_CARE_PROVIDER_SITE_OTHER): Payer: Medicare Other | Admitting: Neurology

## 2013-03-30 VITALS — BP 150/71 | HR 74 | Resp 16

## 2013-03-30 DIAGNOSIS — G4731 Primary central sleep apnea: Secondary | ICD-10-CM

## 2013-03-30 DIAGNOSIS — R0681 Apnea, not elsewhere classified: Secondary | ICD-10-CM

## 2013-03-31 ENCOUNTER — Telehealth: Payer: Self-pay | Admitting: *Deleted

## 2013-03-31 NOTE — Telephone Encounter (Signed)
Left message for patient regarding sleep study results, asked patient to call me back to discuss results and have questions answered.  Explained that a copy of the sleep study was sent to referring physician and copy will be waiting for him when he comes to see Dr. Anne Hahn tomorrow in the clinic.  Asked patient if he needs a new machine or if his current machine works but just needs to be reset, asked him to call us and let us know or to stop by the sleep lab tomorrow and let us know so we can send his orders in.  Copy of report also sent to PCP Dr. Nehemiah Settle.

## 2013-04-01 ENCOUNTER — Ambulatory Visit (INDEPENDENT_AMBULATORY_CARE_PROVIDER_SITE_OTHER): Payer: Medicare Other | Admitting: Neurology

## 2013-04-01 ENCOUNTER — Encounter: Payer: Self-pay | Admitting: Neurology

## 2013-04-01 VITALS — BP 128/72 | HR 59 | Ht 68.0 in | Wt 188.0 lb

## 2013-04-01 DIAGNOSIS — F0390 Unspecified dementia without behavioral disturbance: Secondary | ICD-10-CM | POA: Diagnosis not present

## 2013-04-01 DIAGNOSIS — G459 Transient cerebral ischemic attack, unspecified: Secondary | ICD-10-CM | POA: Diagnosis not present

## 2013-04-01 NOTE — Progress Notes (Signed)
Reason for visit: Memory disturbance  Derek [name] is an 79 y.o. male  History of present illness:  Mr. Escoto is an 79 year old right-handed white male with a history of a progressive memory disturbance. The patient was last seen in October of 2013, and he was in general doing well at that time on Aricept. The patient is still operating a motor vehicle, but he has limited his driving to very short distances. The patient was admitted to the hospital in March of 2014 with an episode of syncope. The patient was felt to have had a brief episode of cardiac arrest, requiring initiation of CPR. The patient underwent a cardiology evaluation, and the family indicates that he also had a cerebrovascular evaluation. The patient was placed on a CardioNet monitor for 30 days, and atrial fibrillation was noted to occur intermittently. The patient has since been on Eliquis as an anticoagulant medication. The patient has not had any further syncopal episodes. His cardiologist, Dr. Garnette Scheuermann has indicated that he should not be driving for 6 months. The patient returns for an evaluation. In June of 2014, the patient had fallen asleep outside on a hot day. The patient was noted to have a transient episode lasting about 10 minutes of garbled speech without associated facial asymmetry, numbness, or weakness of the extremities. This fully resolved. EMS was called, but the patient never went to the hospital. The patient comes to this office for an evaluation.  Past Medical History  Diagnosis Date  . CVA (cerebral vascular accident)   . Broken shoulder   . Cerebrovascular disease   . Sleep apnea   . Shingles     left chest  . Compression fracture   . Hypertension   . Pneumonia   . Atrial fibrillation   . Clavicle fracture   . Syncope   . History of pneumonia     Past Surgical History  Procedure Laterality Date  . Tonsillectomy    . Cataract extraction Bilateral   . Kyphoplasty    . Shoulder surgery  Left     Family History  Problem Relation Age of Onset  . Diabetes Mother   . Diabetes Father   . Cancer - Lung Brother   . Gait disorder Brother     Social history:  reports that he has never smoked. He does not have any smokeless tobacco history on file. He reports that  drinks alcohol. He reports that he does not use illicit drugs.  Allergies:  Allergies  Allergen Reactions  . Augmentin (Amoxicillin-Pot Clavulanate)     rash  . Penicillins   . Sulfur Rash    Medications:  Current Outpatient Prescriptions on File Prior to Visit  Medication Sig Dispense Refill  . amLODipine (NORVASC) 10 MG tablet Take 10 mg by mouth daily.      Marland Kitchen apixaban (ELIQUIS) 5 MG TABS tablet Take by mouth 2 (two) times daily.      . Coenzyme Q10 (COQ10) 100 MG CAPS Take by mouth daily.      Marland Kitchen donepezil (ARICEPT) 10 MG tablet Take 10 mg by mouth at bedtime.      . Multiple Vitamins-Minerals (CENTRUM SILVER PO) Take by mouth daily.       No current facility-administered medications on file prior to visit.    ROS:  Out of a complete 14 system review of symptoms, the patient complains only of the following symptoms, and all other reviewed systems are negative.  Hearing loss Blurred vision Snoring Feeling  cold Memory disturbance Decreased energy, excessive daytime drowsiness, snoring  Blood pressure 128/72, pulse 59, height 5\' 8"  (1.727 m), weight 188 lb (85.276 kg).  Physical Exam  General: The patient is alert and cooperative at the time of the examination.  Skin: No significant peripheral edema is noted.   Neurologic Exam  Mental status: Mini-Mental status examination done today shows a total score of 28/30. The patient is able to name 9 animals in 60 seconds.  Cranial nerves: Facial symmetry is present. Speech is normal, no aphasia or dysarthria is noted. Extraocular movements are full. Visual fields are full.  Motor: The patient has good strength in all 4  extremities.  Coordination: The patient has good finger-nose-finger and heel-to-shin bilaterally.  Gait and station: The patient has a normal gait. Tandem gait is normal. Romberg is negative. No drift is seen.  Reflexes: Deep tendon reflexes are symmetric.   Assessment/Plan:  1. Memory disturbance  2. Syncope, atrial fibrillation  3. Sleep apnea, with central and obstructive components  4. Transient speech disturbance, rule out TIA  The patient had an event of transient garbled speech in June of 2014. This could have represented a TIA event. We will repeat MRI evaluation of the brain. The patient is already on anticoagulation therapy. The patient has sleep apnea, and he is not on CPAP or BiPAP, as his machine is broken. This issue will need to be resolved, as the sleep apnea may play into an increased problem with memory. The patient will be maintained on Aricept for now, and he will followup in about 6 months. The driving will need to be scrutinized closely, as the patient may need to refrain from driving as the dementia progresses. MMSE scores remain stable, however.  Marlan Palau MD 04/02/2013 10:10 AM  [facility] [address] [place], [place] [address]  Phone [phone] Fax [phone]

## 2013-04-04 DIAGNOSIS — Z961 Presence of intraocular lens: Secondary | ICD-10-CM | POA: Diagnosis not present

## 2013-04-04 DIAGNOSIS — H02539 Eyelid retraction unspecified eye, unspecified lid: Secondary | ICD-10-CM | POA: Diagnosis not present

## 2013-04-04 DIAGNOSIS — H10409 Unspecified chronic conjunctivitis, unspecified eye: Secondary | ICD-10-CM | POA: Diagnosis not present

## 2013-04-04 DIAGNOSIS — H10029 Other mucopurulent conjunctivitis, unspecified eye: Secondary | ICD-10-CM | POA: Diagnosis not present

## 2013-04-04 DIAGNOSIS — H16109 Unspecified superficial keratitis, unspecified eye: Secondary | ICD-10-CM | POA: Diagnosis not present

## 2013-04-05 ENCOUNTER — Other Ambulatory Visit: Payer: Self-pay | Admitting: Neurology

## 2013-04-05 DIAGNOSIS — G4731 Primary central sleep apnea: Secondary | ICD-10-CM

## 2013-04-05 DIAGNOSIS — G4739 Other sleep apnea: Secondary | ICD-10-CM

## 2013-04-08 ENCOUNTER — Other Ambulatory Visit: Payer: Self-pay | Admitting: Neurology

## 2013-04-08 DIAGNOSIS — R063 Periodic breathing: Secondary | ICD-10-CM

## 2013-04-09 DIAGNOSIS — F0390 Unspecified dementia without behavioral disturbance: Secondary | ICD-10-CM | POA: Diagnosis not present

## 2013-04-11 ENCOUNTER — Other Ambulatory Visit: Payer: Self-pay | Admitting: Neurology

## 2013-04-11 ENCOUNTER — Telehealth: Payer: Self-pay | Admitting: Neurology

## 2013-04-11 DIAGNOSIS — G459 Transient cerebral ischemic attack, unspecified: Secondary | ICD-10-CM

## 2013-04-11 DIAGNOSIS — F0390 Unspecified dementia without behavioral disturbance: Secondary | ICD-10-CM

## 2013-04-11 NOTE — Telephone Encounter (Signed)
I called the patient and I left a message. MRI the brain shows a left pontine infarct on the left that is chronic, and chronic small vessel disease to a moderate level. This may be adding to some of the memory issues. The patient is on Apixaban currently.

## 2013-04-12 DIAGNOSIS — D649 Anemia, unspecified: Secondary | ICD-10-CM | POA: Diagnosis not present

## 2013-04-13 ENCOUNTER — Encounter: Payer: Self-pay | Admitting: Neurology

## 2013-04-13 DIAGNOSIS — Z961 Presence of intraocular lens: Secondary | ICD-10-CM | POA: Diagnosis not present

## 2013-04-13 DIAGNOSIS — H109 Unspecified conjunctivitis: Secondary | ICD-10-CM | POA: Diagnosis not present

## 2013-04-13 DIAGNOSIS — H01029 Squamous blepharitis unspecified eye, unspecified eyelid: Secondary | ICD-10-CM | POA: Diagnosis not present

## 2013-04-14 ENCOUNTER — Encounter: Payer: Self-pay | Admitting: Neurology

## 2013-04-14 ENCOUNTER — Ambulatory Visit (INDEPENDENT_AMBULATORY_CARE_PROVIDER_SITE_OTHER): Payer: Medicare Other | Admitting: Neurology

## 2013-04-14 VITALS — BP 144/73 | HR 56 | Resp 18 | Ht 68.0 in | Wt 186.0 lb

## 2013-04-14 DIAGNOSIS — R001 Bradycardia, unspecified: Secondary | ICD-10-CM

## 2013-04-14 DIAGNOSIS — I498 Other specified cardiac arrhythmias: Secondary | ICD-10-CM | POA: Diagnosis not present

## 2013-04-14 DIAGNOSIS — I4891 Unspecified atrial fibrillation: Secondary | ICD-10-CM

## 2013-04-14 DIAGNOSIS — G4739 Other sleep apnea: Secondary | ICD-10-CM

## 2013-04-14 DIAGNOSIS — G4730 Sleep apnea, unspecified: Secondary | ICD-10-CM | POA: Diagnosis not present

## 2013-04-14 DIAGNOSIS — G4731 Primary central sleep apnea: Secondary | ICD-10-CM

## 2013-04-14 NOTE — Progress Notes (Signed)
Chief Complaint  Patient presents with  . Follow-up    sleep consult, 6-8 f/u app,rm 10     Derek [name], an 79 year old patient of Dr Anne Hahn' , presents today for a followup visit after a split-night polysomnography, performed on 03-14-13 the patient had a known history of sleep apnea and has used a CPAP machine before for many years. Over the last 3 months possibly even longer he had an altered daytime fatigue breakthrough snoring and an inability to sleep with her machine. It also had make noises that he didn't notice  Before. His  Epworth sleepiness score was elevated at 17 points his fatigue score at 40 points.  The patient's baseline AHI was 81.5 per hour of sleep his RDI was at the same level. The patient did not have major desaturations his lowest oxygen level at night was 85% for only 4 minute period the patient was titrated first the CPAP but developed frequent central apneas and central hypotony is, and as there was no benefit nor did BiPAP was first applied,  again without major improvement.  Adapt  SV was implemented  And the patient responded very well.  He was titrated  In a full night study on 7-23 2014 and end expiratory pressure of 6 cm (EEP )  the minimum pressure support of 5 cm water and a maximum pressure support of 15 cm water ; his total AHI for the whole night was 1.5 per hour.   He used a Radiographer, therapeutic -Paykel  Eson  medium-sized nasal mask as well as a chin strap.   I noted that the patient had frequent periodic limb movements and twitching at night but in today's visit he denies having ever been bothered by it.  He does not have restless leg symptoms and he does not feel that any twitches or her arm jerking movements keep him from sleeping or waking him up out of sleep.   I therefore decided not  to treat this condition as it seems to be asymptomatic.  The patient will receive a prescription for him machine based on his need for an ADAPT sv device,  And his CURRENT MACHINE  not  having the same modality possibilities , as well as being at this time apparently nor longer functioning. Today's Epworth sleepiness score is 12 points, and  fatigue severity of 34 points. There have been no changes in medication since his last visit with me or Dr. Anne Hahn.     Physical exam:  General: The patient is awake, alert and appears not in acute distress. The patient is well groomed.  Head: Normocephalic, atraumatic. Neck is supple. Mallampati 3 , neck circumference: 15 inches , no septal deviation, no retrognathia.  Cardiovascular: irregular rate and rhythm , without murmurs or carotid bruit, and without distended neck veins.  Respiratory: Lungs are clear to auscultation.  Skin: no  evidence of edema, or rash  Trunk: normal posture.  Neurologic exam :  The patient is awake and alert, oriented to place and time. Memory subjective described as impaired , but there is a normal attention span & concentration ability. His last MMSE was with Dr Anne Hahn.   Speech is fluent without dysarthria, but dysphonia. Mood and affect are depressed- he wants to drive and is upset by not being permitted to.  Cranial nerves:  Pupils are equal . Extraocular movements in vertical and horizontal planes intact and without nystagmus. Visual fields by finger perimetry are intact. Rare blink, masked face.  Hearing  to finger rub intact. Facial sensation intact to fine touch. Facial motor strength is symmetric and tongue and uvula move midline.  Motor exam: Normal tone and normal muscle bulk and symmetric normal strength in all extremities.  Sensory: Fine touch, pinprick and vibration were tested in all extremities. Proprioception normal.  Coordination: Rapid alternating movements in the fingers/hands is tested and normal. Finger-to-nose maneuver tested and normal without evidence of ataxia, dysmetria or tremor.  Gait and station: Patient walks without assistive device and is able with minimal assistance  To climb up  to the exam table.  Deep tendon reflexes: in the upper and lower extremities are symmetric and intact. Babinski downgoing.    Assessment : Derek [name] has documented complex sleep apnea there are or some obstructive events but mainly central apneas noted in his baseline studies. The sleep apnea was not adequately addressed under CPAP or BiPAP titration. He also was noted to snore loud and irregular before the machine was applied to. His baseline AHI was 81.5 on July 7 he was titrated to a duct SV on 04/03/2013 is a complete resolution of apnea and note was made about his irregular heart beat.  A copy of the sleep study will be sent to Dr. Sherilyn Cooter for this and was already made available to Dr. Lesia Sago.   A prescription was forwarded to [facility] [place].

## 2013-04-18 DIAGNOSIS — H353190 Nonexudative age-related macular degeneration, unspecified eye, stage unspecified: Secondary | ICD-10-CM | POA: Diagnosis not present

## 2013-04-18 DIAGNOSIS — H43819 Vitreous degeneration, unspecified eye: Secondary | ICD-10-CM | POA: Diagnosis not present

## 2013-04-18 DIAGNOSIS — H35379 Puckering of macula, unspecified eye: Secondary | ICD-10-CM | POA: Diagnosis not present

## 2013-05-10 ENCOUNTER — Ambulatory Visit: Payer: BLUE CROSS/BLUE SHIELD | Admitting: Neurology

## 2013-05-23 DIAGNOSIS — Z961 Presence of intraocular lens: Secondary | ICD-10-CM | POA: Diagnosis not present

## 2013-05-23 DIAGNOSIS — H01029 Squamous blepharitis unspecified eye, unspecified eyelid: Secondary | ICD-10-CM | POA: Diagnosis not present

## 2013-05-23 DIAGNOSIS — H1045 Other chronic allergic conjunctivitis: Secondary | ICD-10-CM | POA: Diagnosis not present

## 2013-05-24 DIAGNOSIS — H43819 Vitreous degeneration, unspecified eye: Secondary | ICD-10-CM | POA: Diagnosis not present

## 2013-05-24 DIAGNOSIS — H353190 Nonexudative age-related macular degeneration, unspecified eye, stage unspecified: Secondary | ICD-10-CM | POA: Diagnosis not present

## 2013-05-24 DIAGNOSIS — H35379 Puckering of macula, unspecified eye: Secondary | ICD-10-CM | POA: Diagnosis not present

## 2013-05-26 DIAGNOSIS — Z7901 Long term (current) use of anticoagulants: Secondary | ICD-10-CM | POA: Diagnosis not present

## 2013-05-26 DIAGNOSIS — G459 Transient cerebral ischemic attack, unspecified: Secondary | ICD-10-CM | POA: Diagnosis not present

## 2013-05-26 DIAGNOSIS — I10 Essential (primary) hypertension: Secondary | ICD-10-CM | POA: Diagnosis not present

## 2013-05-26 DIAGNOSIS — I4891 Unspecified atrial fibrillation: Secondary | ICD-10-CM | POA: Diagnosis not present

## 2013-05-27 DIAGNOSIS — C4442 Squamous cell carcinoma of skin of scalp and neck: Secondary | ICD-10-CM | POA: Diagnosis not present

## 2013-05-27 DIAGNOSIS — L708 Other acne: Secondary | ICD-10-CM | POA: Diagnosis not present

## 2013-05-27 DIAGNOSIS — D235 Other benign neoplasm of skin of trunk: Secondary | ICD-10-CM | POA: Diagnosis not present

## 2013-05-27 DIAGNOSIS — L570 Actinic keratosis: Secondary | ICD-10-CM | POA: Diagnosis not present

## 2013-06-07 DIAGNOSIS — C4442 Squamous cell carcinoma of skin of scalp and neck: Secondary | ICD-10-CM | POA: Diagnosis not present

## 2013-06-10 ENCOUNTER — Other Ambulatory Visit: Payer: Self-pay | Admitting: Dermatology

## 2013-06-10 ENCOUNTER — Ambulatory Visit
Admission: RE | Admit: 2013-06-10 | Discharge: 2013-06-10 | Disposition: A | Discharge status: Home / Self Care | Payer: Medicare Other | Source: Ambulatory Visit | Attending: Dermatology | Admitting: Dermatology

## 2013-06-10 DIAGNOSIS — R091 Pleurisy: Secondary | ICD-10-CM | POA: Diagnosis not present

## 2013-06-10 DIAGNOSIS — C4442 Squamous cell carcinoma of skin of scalp and neck: Secondary | ICD-10-CM

## 2013-06-14 DIAGNOSIS — C4492 Squamous cell carcinoma of skin, unspecified: Secondary | ICD-10-CM | POA: Diagnosis not present

## 2013-06-14 DIAGNOSIS — C4442 Squamous cell carcinoma of skin of scalp and neck: Secondary | ICD-10-CM | POA: Diagnosis not present

## 2013-06-21 DIAGNOSIS — H35379 Puckering of macula, unspecified eye: Secondary | ICD-10-CM | POA: Diagnosis not present

## 2013-06-21 DIAGNOSIS — H353190 Nonexudative age-related macular degeneration, unspecified eye, stage unspecified: Secondary | ICD-10-CM | POA: Diagnosis not present

## 2013-06-28 DIAGNOSIS — D235 Other benign neoplasm of skin of trunk: Secondary | ICD-10-CM | POA: Diagnosis not present

## 2013-06-28 DIAGNOSIS — C44519 Basal cell carcinoma of skin of other part of trunk: Secondary | ICD-10-CM | POA: Diagnosis not present

## 2013-06-28 DIAGNOSIS — Z85828 Personal history of other malignant neoplasm of skin: Secondary | ICD-10-CM | POA: Diagnosis not present

## 2013-06-28 DIAGNOSIS — S0100XA Unspecified open wound of scalp, initial encounter: Secondary | ICD-10-CM | POA: Diagnosis not present

## 2013-06-30 ENCOUNTER — Encounter: Payer: Self-pay | Admitting: Neurology

## 2013-07-07 DIAGNOSIS — Z23 Encounter for immunization: Secondary | ICD-10-CM | POA: Diagnosis not present

## 2013-07-09 DIAGNOSIS — I219 Acute myocardial infarction, unspecified: Secondary | ICD-10-CM

## 2013-07-09 HISTORY — DX: Acute myocardial infarction, unspecified: I21.9

## 2013-07-11 ENCOUNTER — Encounter: Payer: Self-pay | Admitting: Interventional Cardiology

## 2013-07-12 ENCOUNTER — Encounter: Payer: Self-pay | Admitting: Interventional Cardiology

## 2013-07-12 ENCOUNTER — Telehealth: Payer: Self-pay | Admitting: Pharmacist

## 2013-07-12 ENCOUNTER — Ambulatory Visit (INDEPENDENT_AMBULATORY_CARE_PROVIDER_SITE_OTHER): Payer: Medicare Other | Admitting: Interventional Cardiology

## 2013-07-12 VITALS — BP 134/68 | HR 63 | Ht 69.5 in | Wt 193.0 lb

## 2013-07-12 DIAGNOSIS — Z7901 Long term (current) use of anticoagulants: Secondary | ICD-10-CM | POA: Insufficient documentation

## 2013-07-12 DIAGNOSIS — I4891 Unspecified atrial fibrillation: Secondary | ICD-10-CM

## 2013-07-12 DIAGNOSIS — I480 Paroxysmal atrial fibrillation: Secondary | ICD-10-CM

## 2013-07-12 DIAGNOSIS — I2510 Atherosclerotic heart disease of native coronary artery without angina pectoris: Secondary | ICD-10-CM

## 2013-07-12 DIAGNOSIS — G459 Transient cerebral ischemic attack, unspecified: Secondary | ICD-10-CM | POA: Diagnosis not present

## 2013-07-12 DIAGNOSIS — R55 Syncope and collapse: Secondary | ICD-10-CM | POA: Insufficient documentation

## 2013-07-12 DIAGNOSIS — Z79899 Other long term (current) drug therapy: Secondary | ICD-10-CM

## 2013-07-12 HISTORY — DX: Atherosclerotic heart disease of native coronary artery without angina pectoris: I25.10

## 2013-07-12 HISTORY — DX: Transient cerebral ischemic attack, unspecified: G45.9

## 2013-07-12 MED ORDER — APIXABAN 2.5 MG PO TABS: 2.5000 mg | ORAL_TABLET | Freq: Two times a day (BID) | ORAL | Status: DC

## 2013-07-12 NOTE — Telephone Encounter (Signed)
Patient is 79 y.o., weighs 193 lbs, and Scr 1.63 in 03/2013.  Patient currently on Eliquis 5 mg bid.    Given age and Scr, patient needs to reduce to Eliquis 2.5 mg bid, and recheck BMET and CBC in 4 weeks.  I notified patient, and instructed him to reduce to 2.5 mg bid.  Rx sent in to [facility].  He will split his 5 mg tablets and take 1/2 tablet twice daily until they run out, then will pick up the new prescription and take 1 tablet daily of the 2.5 mg pills.  He will recheck BMET and CBC on 08/08/13.  Patient shows verbal understanding of plan.   To Dr. Katrinka Blazing as Lorain Childes.

## 2013-07-12 NOTE — Patient Instructions (Signed)
Your physician recommends that you continue on your current medications as directed. Please refer to the Current Medication list given to you today.  Your physician wants you to follow-up in: 6 months. You will receive a reminder letter in the mail two months in advance. If you don't receive a letter, please call our office to schedule the follow-up appointment.  

## 2013-07-12 NOTE — Progress Notes (Signed)
Patient ID: Derek [name], male   DOB: 1926/09/23, 79 y.o.   MRN: [id number]    [address] [place], [place]  [address] Phone: [phone] Fax:  [phone]  Date:  07/12/2013   ID:  Derek [name], DOB 1927-07-15, MRN [id number]  PCP:  Katy Apo, MD   ASSESSMENT:  1. Paroxysmal atrial fibrillation with minimal symptoms and currently in sinus rhythm paced upon exam 2. Chronic anticoagulation therapy with Eliquis, no evidence of bleeding 3. Coronary artery disease with minimal anginal complaints 4. Recent near syncopal episode in church.  PLAN:  1. Will be followed in anticoagulation clinic twice a year to rule out changes that will require alteration of therapy. 2. Continue to advise against driving 3. Notify if bleeding complications   SUBJECTIVE: Derek [name] is a 79 y.o. male is doing relatively well. He did have a near syncopal episode in church recently. He inverted the episode but sitting and allowing pallor, diaphoresis, nausea to resolve. He also has had occasional tightness in his chest. He feels this is related to missing doses of eloquence which occurred when a skin cancer was removed from his left fourth head. He is now back on therapy daily. No bleeding on treatment. He has not fallen or had head trauma. He is not driving. He has not needed nitroglycerin. He denies sustained tachypalpitations per   Wt Readings from Last 3 Encounters:  07/12/13 193 lb (87.544 kg)  04/14/13 186 lb (84.369 kg)  04/13/13 184 lb (83.462 kg)     Past Medical History  Diagnosis Date  . CVA (cerebral vascular accident)   . Broken shoulder   . Cerebrovascular disease   . Sleep apnea   . Shingles     left chest  . Compression fracture   . Hypertension   . Pneumonia   . Atrial fibrillation   . Clavicle fracture   . Syncope   . History of pneumonia   . Complex sleep apnea syndrome     adapt SV titration EEP 6 min 6 and max 15 cm water.   Marland Kitchen PAF  (paroxysmal atrial fibrillation) 07/12/2013  . Syncope and collapse 07/12/2013  . TIA (transient ischemic attack) 07/12/2013  . Long term (current) use of anticoagulants 07/12/2013    Eliquis    . Coronary atherosclerosis of native coronary artery 07/12/2013    Angina     Current Outpatient Prescriptions  Medication Sig Dispense Refill  . amLODipine (NORVASC) 10 MG tablet Take 10 mg by mouth daily.      Marland Kitchen apixaban (ELIQUIS) 5 MG TABS tablet Take by mouth 2 (two) times daily.      . cholecalciferol (VITAMIN D) 1000 UNITS tablet Take 1,000 Units by mouth daily.      . Coenzyme Q10 (COQ10) 100 MG CAPS Take by mouth daily.      Marland Kitchen donepezil (ARICEPT) 10 MG tablet Take 10 mg by mouth at bedtime.      Marland Kitchen losartan (COZAAR) 100 MG tablet Take 100 mg by mouth daily.      . Multiple Vitamins-Minerals (CENTRUM SILVER PO) Take by mouth daily.      Marland Kitchen omeprazole (PRILOSEC) 20 MG capsule Take 20 mg by mouth daily.       No current facility-administered medications for this visit.    Allergies:    Allergies  Allergen Reactions  . Augmentin [Amoxicillin-Pot Clavulanate]     rash  . Penicillins   . Sulfur Rash  Social History:  The patient  reports that he has never smoked. He does not have any smokeless tobacco history on file. He reports that he drinks alcohol. He reports that he does not use illicit drugs.   ROS:  Please see the history of present illness.   Decreased memory. Difficulty with gait. Denies headache. No bleeding. No nitroglycerin use.   All other systems reviewed and negative.   OBJECTIVE: VS:  BP 134/68  Pulse 63  Ht 5' 9.5" (1.765 m)  Wt 193 lb (87.544 kg)  BMI 28.10 kg/m2 Well nourished, well developed, in no acute distress, appearing his stated age HEENT: normal Neck: JVD flat. Carotid bruit absent  Cardiac:  normal S1, S2; RRR; no murmur Lungs:  clear to auscultation bilaterally, no wheezing, rhonchi or rales Abd: soft, nontender, no hepatomegaly Ext: Edema none. Pulses  2+ bilateral Skin: warm and dry Neuro:  CNs 2-12 intact, no focal abnormalities noted  EKG:  No performed on this visit       Signed, Darci Needle III, MD 07/12/2013 11:49 AM

## 2013-07-12 NOTE — Telephone Encounter (Signed)
Message copied by Lou Miner on Tue Jul 12, 2013  3:06 PM ------      Message from: Jarvis Newcomer      Created: Tue Jul 12, 2013  1:34 PM      Regarding: patient on eliquis (FYI)       Derek [name],            Dr.Smith wanted you to know that this pt is currently on Eliquis 5mg  BID and declined to have a consult in the coumadin clinic. ------

## 2013-07-26 DIAGNOSIS — S0100XA Unspecified open wound of scalp, initial encounter: Secondary | ICD-10-CM | POA: Diagnosis not present

## 2013-08-03 DIAGNOSIS — Z85828 Personal history of other malignant neoplasm of skin: Secondary | ICD-10-CM | POA: Diagnosis not present

## 2013-08-05 ENCOUNTER — Inpatient Hospital Stay (HOSPITAL_COMMUNITY)
Admission: EM | Admit: 2013-08-05 | Discharge: 2013-08-09 | DRG: 249 | Disposition: A | Discharge status: Home / Self Care | Payer: Medicare Other | Attending: Interventional Cardiology | Admitting: Interventional Cardiology

## 2013-08-05 ENCOUNTER — Emergency Department (HOSPITAL_COMMUNITY): Payer: Medicare Other

## 2013-08-05 ENCOUNTER — Encounter (HOSPITAL_COMMUNITY): Payer: Self-pay | Admitting: Emergency Medicine

## 2013-08-05 DIAGNOSIS — I498 Other specified cardiac arrhythmias: Secondary | ICD-10-CM | POA: Diagnosis not present

## 2013-08-05 DIAGNOSIS — I249 Acute ischemic heart disease, unspecified: Secondary | ICD-10-CM

## 2013-08-05 DIAGNOSIS — Z7901 Long term (current) use of anticoagulants: Secondary | ICD-10-CM

## 2013-08-05 DIAGNOSIS — G459 Transient cerebral ischemic attack, unspecified: Secondary | ICD-10-CM

## 2013-08-05 DIAGNOSIS — I2129 ST elevation (STEMI) myocardial infarction involving other sites: Principal | ICD-10-CM | POA: Diagnosis present

## 2013-08-05 DIAGNOSIS — G4733 Obstructive sleep apnea (adult) (pediatric): Secondary | ICD-10-CM | POA: Diagnosis present

## 2013-08-05 DIAGNOSIS — I4891 Unspecified atrial fibrillation: Secondary | ICD-10-CM | POA: Diagnosis present

## 2013-08-05 DIAGNOSIS — R079 Chest pain, unspecified: Secondary | ICD-10-CM | POA: Diagnosis present

## 2013-08-05 DIAGNOSIS — Z79899 Other long term (current) drug therapy: Secondary | ICD-10-CM | POA: Diagnosis not present

## 2013-08-05 DIAGNOSIS — I2510 Atherosclerotic heart disease of native coronary artery without angina pectoris: Secondary | ICD-10-CM | POA: Diagnosis not present

## 2013-08-05 DIAGNOSIS — I200 Unstable angina: Secondary | ICD-10-CM

## 2013-08-05 DIAGNOSIS — R072 Precordial pain: Secondary | ICD-10-CM | POA: Diagnosis not present

## 2013-08-05 DIAGNOSIS — Z9861 Coronary angioplasty status: Secondary | ICD-10-CM

## 2013-08-05 DIAGNOSIS — F0390 Unspecified dementia without behavioral disturbance: Secondary | ICD-10-CM | POA: Diagnosis present

## 2013-08-05 DIAGNOSIS — R55 Syncope and collapse: Secondary | ICD-10-CM

## 2013-08-05 DIAGNOSIS — I10 Essential (primary) hypertension: Secondary | ICD-10-CM | POA: Diagnosis present

## 2013-08-05 DIAGNOSIS — I214 Non-ST elevation (NSTEMI) myocardial infarction: Secondary | ICD-10-CM | POA: Diagnosis present

## 2013-08-05 DIAGNOSIS — Z7982 Long term (current) use of aspirin: Secondary | ICD-10-CM | POA: Diagnosis not present

## 2013-08-05 DIAGNOSIS — Z8673 Personal history of transient ischemic attack (TIA), and cerebral infarction without residual deficits: Secondary | ICD-10-CM | POA: Diagnosis not present

## 2013-08-05 DIAGNOSIS — I480 Paroxysmal atrial fibrillation: Secondary | ICD-10-CM

## 2013-08-05 DIAGNOSIS — I359 Nonrheumatic aortic valve disorder, unspecified: Secondary | ICD-10-CM | POA: Diagnosis not present

## 2013-08-05 LAB — CBC
HCT: 45.2 % (ref 39.0–52.0)
Hemoglobin: 15 g/dL (ref 13.0–17.0)
MCH: 25.3 pg — ABNORMAL LOW (ref 26.0–34.0)
MCHC: 33.2 g/dL (ref 30.0–36.0)
Platelets: 216 10*3/uL (ref 150–400)
RDW: 15 % (ref 11.5–15.5)
WBC: 6.4 10*3/uL (ref 4.0–10.5)

## 2013-08-05 LAB — HEPATIC FUNCTION PANEL
ALT: 25 U/L (ref 0–53)
AST: 31 U/L (ref 0–37)
Albumin: 4.2 g/dL (ref 3.5–5.2)
Alkaline Phosphatase: 85 U/L (ref 39–117)
Bilirubin, Direct: 0.2 mg/dL (ref 0.0–0.3)
Indirect Bilirubin: 0 mg/dL — ABNORMAL LOW (ref 0.3–0.9)
Total Bilirubin: 0.2 mg/dL — ABNORMAL LOW (ref 0.3–1.2)
Total Protein: 8.2 g/dL (ref 6.0–8.3)

## 2013-08-05 LAB — TROPONIN I
Troponin I: <0.3 ng/mL (ref <0.30)
Troponin I: 1.36 ng/mL (ref <0.30)

## 2013-08-05 LAB — BASIC METABOLIC PANEL
BUN: 19 mg/dL (ref 6–23)
Chloride: 102 mEq/L (ref 96–112)
Creatinine, Ser: 1.29 mg/dL (ref 0.50–1.35)
GFR calc Af Amer: 57 mL/min — ABNORMAL LOW (ref >90)
GFR calc non Af Amer: 49 mL/min — ABNORMAL LOW (ref >90)
Potassium: 3.8 mEq/L (ref 3.5–5.1)

## 2013-08-05 LAB — LIPASE, BLOOD: Lipase: 51 U/L (ref 11–59)

## 2013-08-05 MED ORDER — DILTIAZEM HCL 25 MG/5ML IV SOLN
15.0000 mg | Freq: Once | INTRAVENOUS | Status: AC
  Administered 2013-08-05: 15 mg via INTRAVENOUS
  Filled 2013-08-05: qty 5

## 2013-08-05 MED ORDER — ACETAMINOPHEN 325 MG PO TABS: 650.0000 mg | ORAL_TABLET | ORAL | Status: DC | PRN

## 2013-08-05 MED ORDER — ONDANSETRON HCL 4 MG/2ML IJ SOLN: 4.0000 mg | Freq: Four times a day (QID) | INTRAMUSCULAR | Status: DC | PRN

## 2013-08-05 MED ORDER — LOSARTAN POTASSIUM 50 MG PO TABS
100.0000 mg | ORAL_TABLET | Freq: Every day | ORAL | Status: DC
  Administered 2013-08-05 – 2013-08-09 (×5): 100 mg via ORAL
  Filled 2013-08-05 (×6): qty 2

## 2013-08-05 MED ORDER — AMLODIPINE BESYLATE 10 MG PO TABS
10.0000 mg | ORAL_TABLET | Freq: Every day | ORAL | Status: DC
  Administered 2013-08-05 – 2013-08-09 (×5): 10 mg via ORAL
  Filled 2013-08-05 (×6): qty 1

## 2013-08-05 MED ORDER — APIXABAN 2.5 MG PO TABS
2.5000 mg | ORAL_TABLET | Freq: Two times a day (BID) | ORAL | Status: DC
Start: 2013-08-05 — End: 2013-08-06
  Administered 2013-08-05: 23:00:00 2.5 mg via ORAL
  Filled 2013-08-05 (×3): qty 1

## 2013-08-05 MED ORDER — NITROGLYCERIN 0.4 MG SL SUBL: 0.4000 mg | SUBLINGUAL_TABLET | SUBLINGUAL | Status: DC | PRN

## 2013-08-05 MED ORDER — DONEPEZIL HCL 10 MG PO TABS
10.0000 mg | ORAL_TABLET | Freq: Every day | ORAL | Status: DC
  Administered 2013-08-05 – 2013-08-09 (×4): 10 mg via ORAL
  Filled 2013-08-05 (×5): qty 1

## 2013-08-05 MED ORDER — PANTOPRAZOLE SODIUM 40 MG PO TBEC
40.0000 mg | DELAYED_RELEASE_TABLET | Freq: Every day | ORAL | Status: DC
  Administered 2013-08-05 – 2013-08-09 (×5): 40 mg via ORAL
  Filled 2013-08-05 (×5): qty 1

## 2013-08-05 MED ORDER — SODIUM CHLORIDE 0.9 % IJ SOLN
3.0000 mL | Freq: Two times a day (BID) | INTRAMUSCULAR | Status: DC
Start: 2013-08-05 — End: 2013-08-09
  Administered 2013-08-05 – 2013-08-08 (×6): 3 mL via INTRAVENOUS

## 2013-08-05 MED ORDER — SODIUM CHLORIDE 0.9 % IV SOLN: 250.0000 mL | INTRAVENOUS | Status: DC | PRN

## 2013-08-05 MED ORDER — SODIUM CHLORIDE 0.9 % IJ SOLN: 3.0000 mL | INTRAMUSCULAR | Status: DC | PRN

## 2013-08-05 MED ORDER — ATORVASTATIN CALCIUM 40 MG PO TABS
40.0000 mg | ORAL_TABLET | Freq: Every day | ORAL | Status: DC
  Administered 2013-08-07 – 2013-08-08 (×2): 40 mg via ORAL
  Filled 2013-08-05 (×4): qty 1

## 2013-08-05 MED ORDER — METOPROLOL TARTRATE 25 MG PO TABS
25.0000 mg | ORAL_TABLET | Freq: Two times a day (BID) | ORAL | Status: DC
  Administered 2013-08-05 – 2013-08-09 (×7): 25 mg via ORAL
  Filled 2013-08-05 (×10): qty 1

## 2013-08-05 NOTE — H&P (Signed)
Admit date: 08/05/2013 Referring Physician Dr. Buelah Manis Primary Cardiologist  Verdis Prime, MD Chief complaint/reason for admission:afib with RVR and CP  HPI: This is an 79yo WM with a history of PAF on NOAC, complex sleep apnea on adapt SV, TIA/CVA, ASCAD and HTN who presented to the ER with compliants of CP.  He says that around 1PM right after he ate lunch he developed severe sharp pain in his chest with no radiation and no associated symptoms of diaphoresis, SOB or nausea.  He sat in his car for a few minutes and then the pain became worse with pressure and he drove home.  By the time he got home he was feeling much worse and he called EMS.  EMS gave him SL NTG and ASA with gradual resolution of his chest pain.  He thinks his pain lasted about 2 hours.  Currently he is pain free.  In the ER he was found to be in afib with RVR.  Cardiology is now asked to consult.  He recently saw Dr. Katrinka Blazing 11/4 as was in NSR.  At the time of that OV he admitted to a recent near syncopal episode in church and was diphoretic with nausea during the episode and also has had some tightness in his chest intermittently.  He had a syncope episode in March 2014 with normal workup including heart monitor at [facility].  On his last OV with Dr. Katrinka Blazing his Eliquis dose was lowered.  He has had some problems with his GB in the past but has not had it taken out.      PMH:    Past Medical History  Diagnosis Date  . CVA (cerebral vascular accident)   . Broken shoulder   . Cerebrovascular disease   . Sleep apnea   . Shingles     left chest  . Compression fracture   . Hypertension   . Pneumonia   . Atrial fibrillation   . Clavicle fracture   . Syncope   . History of pneumonia   . Complex sleep apnea syndrome     adapt SV titration EEP 6 min 6 and max 15 cm water.   Marland Kitchen PAF (paroxysmal atrial fibrillation) 07/12/2013  . Syncope and collapse 07/12/2013  . TIA (transient ischemic attack) 07/12/2013  . Long term (current) use of  anticoagulants 07/12/2013    Eliquis    . Coronary atherosclerosis of native coronary artery 07/12/2013    Angina     PSH:    Past Surgical History  Procedure Laterality Date  . Tonsillectomy    . Cataract extraction Bilateral   . Kyphoplasty    . Shoulder surgery Left     ALLERGIES:   Augmentin; Penicillins; and Sulfur  Prior to Admit Meds:   (Not in a hospital admission) Family HX:    Family History  Problem Relation Age of Onset  . Diabetes Mother   . Diabetes Father   . Cancer - Lung Brother   . Gait disorder Brother   . Cancer Daughter    Social HX:    History   Social History  . Marital Status: Married    Spouse Name: N/A    Number of Children: 2  . Years of Education: IT trainer   Occupational History  . retired    Social History Main Topics  . Smoking status: Never Smoker   . Smokeless tobacco: Not on file  . Alcohol Use: Yes     Comment: occasionally  . Drug Use: No  .  Sexual Activity: Not on file   Other Topics Concern  . Not on file   Social History Narrative  . No narrative on file     ROS:  All 11 ROS were addressed and are negative except what is stated in the HPI  PHYSICAL EXAM Filed Vitals:   08/05/13 1930  BP: 135/92  Pulse: 34  Temp:   Resp: 17   General: Well developed, well nourished, in no acute distress Head: Eyes PERRLA, No xanthomas.   Normal cephalic and atramatic  Lungs:   Clear bilaterally to auscultation and percussion. Heart:   HRRR S1 S2 Pulses are 2+ & equal.            No carotid bruit. No JVD.  No abdominal bruits. No femoral bruits. Abdomen: Bowel sounds are positive, abdomen soft and non-tender without masses  Extremities:   No clubbing, cyanosis or edema.  DP +1 Neuro: Alert and oriented X 3. Psych:  Good affect, responds appropriately   Labs:   Lab Results  Component Value Date   WBC 6.4 08/05/2013   HGB 15.0 08/05/2013   HCT 45.2 08/05/2013   MCV 76.4* 08/05/2013   PLT 216 08/05/2013    Recent Labs Lab  08/05/13 1508  NA 139  K 3.8  CL 102  CO2 19  BUN 19  CREATININE 1.29  CALCIUM 9.8  PROT 8.2  BILITOT 0.2*  ALKPHOS 85  ALT 25  AST 31  GLUCOSE 138*   Lab Results  Component Value Date   CKTOTAL 145 02/04/2011   CKMB 2.7 02/04/2011   TROPONINI 1.36* 08/05/2013   No results found for this basename: PTT   Lab Results  Component Value Date   INR 1.07 03/15/2012   INR 1.02 03/14/2012   INR 0.96 02/12/2011     Lab Results  Component Value Date   CHOL 173 02/04/2011   Lab Results  Component Value Date   HDL 32* 02/04/2011   Lab Results  Component Value Date   LDLCALC  Value: 123        Total Cholesterol/HDL:CHD Risk Coronary Heart Disease Risk Table                     Men   Women  1/2 Average Risk   3.4   3.3  Average Risk       5.0   4.4  2 X Average Risk   9.6   7.1  3 X Average Risk  23.4   11.0        Use the calculated Patient Ratio above and the CHD Risk Table to determine the patient's CHD Risk.        ATP III CLASSIFICATION (LDL):  <100     mg/dL   Optimal  161-096  mg/dL   Near or Above                    Optimal  130-159  mg/dL   Borderline  045-409  mg/dL   High  >811     mg/dL   Very High* 05/22/7828   Lab Results  Component Value Date   TRIG 92 02/04/2011   Lab Results  Component Value Date   CHOLHDL 5.4 02/04/2011   No results found for this basename: LDLDIRECT      Radiology: CLINICAL DATA: Chest pain  EXAM:  PORTABLE CHEST - 1 VIEW  COMPARISON: 06/10/2013  FINDINGS:  Stable heart, mediastinal, and hilar contours. Atherosclerotic  calcification of the thoracic aortic arch. Pulmonary vascularity is  normal. Negative for pleural effusion. Bones appear osteopenic. A  left shoulder arthroplasty is noted.  IMPRESSION:  No acute findings.    EKG:  afib with RVR at 123bpm  ASSESSMENT:  1.  New onset afib with RVR now HR 99bpm 2.  NSTEMI ? Secondary to demand ischemia from #1 but could also be coronary ischemia in light of recent episodes of CP 3.  HTN 4.   Recent near syncopal episode  PLAN:   1.  Admit to tele bed 2.  Cycle cardiac enzymes 3.  Continue Eliquis 4.  Continue amlodipine/losartan 5.  Start Lopressor 25mg  BID 6.  2D echo in am assess LVF  Quintella Reichert, MD  08/05/2013  8:35 PM

## 2013-08-05 NOTE — ED Notes (Signed)
Per EMS: Pt had sudden onset of 8/10 chest tightness at 1320; denies radiation, SOB, N/V. Pt mildly diaphoretic. Pt in A fix 120-140 bp,, hx of the same. Given 324 aspirin. Given 1 nitro, relieving pain to 1/10. AO x4. 133/91. 100% 2L.

## 2013-08-05 NOTE — ED Provider Notes (Addendum)
CSN: [id number]     Arrival date & time 08/05/13  1433 History   First MD Initiated Contact with Patient 08/05/13 1453     Chief Complaint  Patient presents with  . Chest Pain   (Consider location/radiation/quality/duration/timing/severity/associated sxs/prior Treatment) Patient is a 79 y.o. male presenting with chest pain. The history is provided by the patient, medical records and the EMS personnel.  Chest Pain Pain location:  Substernal area Pain quality: burning and pressure   Pain radiates to:  Does not radiate Pain radiates to the back: no   Pain severity:  Moderate Onset quality:  Sudden Chronicity:  Recurrent Context: eating   Relieved by:  Nitroglycerin and aspirin Worsened by:  Nothing tried Associated symptoms: diaphoresis   Associated symptoms: no abdominal pain, no back pain, no cough, no dizziness, no fever, no heartburn, no nausea, no shortness of breath and not vomiting   Risk factors: hypertension     Past Medical History  Diagnosis Date  . CVA (cerebral vascular accident)   . Broken shoulder   . Cerebrovascular disease   . Sleep apnea   . Shingles     left chest  . Compression fracture   . Hypertension   . Pneumonia   . Atrial fibrillation   . Clavicle fracture   . Syncope   . History of pneumonia   . Complex sleep apnea syndrome     adapt SV titration EEP 6 min 6 and max 15 cm water.   Marland Kitchen PAF (paroxysmal atrial fibrillation) 07/12/2013  . Syncope and collapse 07/12/2013  . TIA (transient ischemic attack) 07/12/2013  . Long term (current) use of anticoagulants 07/12/2013    Eliquis    . Coronary atherosclerosis of native coronary artery 07/12/2013    Angina    Past Surgical History  Procedure Laterality Date  . Tonsillectomy    . Cataract extraction Bilateral   . Kyphoplasty    . Shoulder surgery Left    Family History  Problem Relation Age of Onset  . Diabetes Mother   . Diabetes Father   . Cancer - Lung Brother   . Gait disorder Brother    . Cancer Daughter    History  Substance Use Topics  . Smoking status: Never Smoker   . Smokeless tobacco: Not on file  . Alcohol Use: Yes     Comment: occasionally    Review of Systems  Constitutional: Positive for diaphoresis. Negative for fever.  Respiratory: Negative for cough and shortness of breath.   Cardiovascular: Positive for chest pain.  Gastrointestinal: Negative for heartburn, nausea, vomiting and abdominal pain.  Musculoskeletal: Negative for back pain.  Neurological: Negative for dizziness.  All other systems reviewed and are negative.    Allergies  Augmentin; Penicillins; and Sulfur  Home Medications   Current Outpatient Rx  Name  Route  Sig  Dispense  Refill  . amLODipine (NORVASC) 10 MG tablet   Oral   Take 10 mg by mouth daily.         Marland Kitchen apixaban (ELIQUIS) 2.5 MG TABS tablet   Oral   Take 2.5 mg by mouth 2 (two) times daily.         . cholecalciferol (VITAMIN D) 1000 UNITS tablet   Oral   Take 1,000 Units by mouth daily.         . Coenzyme Q10 (COQ10) 100 MG CAPS   Oral   Take 1 capsule by mouth daily.          Marland Kitchen  donepezil (ARICEPT) 10 MG tablet   Oral   Take 10 mg by mouth at bedtime.         Marland Kitchen losartan (COZAAR) 100 MG tablet   Oral   Take 100 mg by mouth daily.         . Multiple Vitamins-Minerals (CENTRUM SILVER PO)   Oral   Take 1 tablet by mouth daily.          Marland Kitchen omeprazole (PRILOSEC) 20 MG capsule   Oral   Take 20 mg by mouth daily.          BP 161/86  Pulse 87  Temp(Src) 97.7 F (36.5 C) (Oral)  Resp 18  SpO2 97% Physical Exam  Nursing note and vitals reviewed. Constitutional: He is oriented to person, place, and time. He appears well-developed and well-nourished. No distress.  HENT:  Head: Normocephalic and atraumatic.  Eyes: EOM are normal. No scleral icterus.  Neck: Normal range of motion. Neck supple.  Cardiovascular: Intact distal pulses.  An irregularly irregular rhythm present. Tachycardia  present.   No murmur heard. Pulmonary/Chest: Effort normal. He has no wheezes. He has no rales.  Abdominal: Soft. He exhibits no distension. There is no tenderness. There is no rebound.  Neurological: He is alert and oriented to person, place, and time. Coordination normal.  Skin: Skin is warm and dry. No rash noted. He is not diaphoretic. No pallor.  Psychiatric: He has a normal mood and affect.    ED Course  Procedures (including critical care time) Labs Review Labs Reviewed  CBC - Abnormal; Notable for the following:    RBC 5.92 (*)    MCV 76.4 (*)    MCH 25.3 (*)    All other components within normal limits  BASIC METABOLIC PANEL - Abnormal; Notable for the following:    Glucose, Bld 138 (*)    GFR calc non Af Amer 49 (*)    GFR calc Af Amer 57 (*)    All other components within normal limits  HEPATIC FUNCTION PANEL - Abnormal; Notable for the following:    Total Bilirubin 0.2 (*)    Indirect Bilirubin 0.0 (*)    All other components within normal limits  TROPONIN I - Abnormal; Notable for the following:    Troponin I 1.36 (*)    All other components within normal limits  TROPONIN I  LIPASE, BLOOD   Imaging Review Dg Chest Port 1 View  08/05/2013   CLINICAL DATA:  Chest pain  EXAM: PORTABLE CHEST - 1 VIEW  COMPARISON:  06/10/2013  FINDINGS: Stable heart, mediastinal, and hilar contours. Atherosclerotic calcification of the thoracic aortic arch. Pulmonary vascularity is normal. Negative for pleural effusion. Bones appear osteopenic. A left shoulder arthroplasty is noted.  IMPRESSION: No acute findings.   Electronically Signed   By: Britta Mccreedy M.D.   On: 08/05/2013 15:36    EKG Interpretation   ECG at time 14:42 shows Atrial fibrillation with RVR at rate 123, poor R wave progression, repol abn inferior and lateral leads.  Abn ECG.    repol abn's more pronounced compared to ECG on 04-02-2012, otherwise no sig changes.     ra sat is 99% and I interpret to be  normal  4:07 PM Pt remains tachycardic, troponin, LFT's, lipase are unremarkable.  4:38 PM HR improved after initial diltiazem dose.  7:26 PM Second troponin is positive.  ASA given to pt by EMS.  Will consult Dr. Katrinka Blazing and cardiology.    MDM  1. Acute coronary syndrome   2. Atrial fibrillation with RVR     Pt with anterior chest pain immediately following eating a heavy pasta lunch.  Pt received NTG by EMS and pain is nearly resolved, currently a 1/10.  Pt with no sig CAD per Dr. Michaelle Copas cardiology note from earlier this month.  Pt has known h/o atrial fib, on eliquis.  Will check LFT's, cardiac work up and observation in the ED.  Pt is feeling improved, if remains pain free, may be able to perform serial enzymes and close follow up with PMD if no sig abn's found and HR is improved.  Dr. Michaelle Copas note indicates pt has paroxysmal atrial fib and was in a sinus rhythm at the time of his office visit on 11/4.  afib with RVR, sudden onset is a possible cause of symptoms today as well.        Gavin Pound. Oletta Lamas, MD 08/05/13 1932  Gavin Pound. Oletta Lamas, MD 08/20/13 [phone]

## 2013-08-06 DIAGNOSIS — F0390 Unspecified dementia without behavioral disturbance: Secondary | ICD-10-CM | POA: Diagnosis not present

## 2013-08-06 DIAGNOSIS — I200 Unstable angina: Secondary | ICD-10-CM | POA: Diagnosis not present

## 2013-08-06 DIAGNOSIS — I2129 ST elevation (STEMI) myocardial infarction involving other sites: Secondary | ICD-10-CM | POA: Diagnosis not present

## 2013-08-06 DIAGNOSIS — I214 Non-ST elevation (NSTEMI) myocardial infarction: Secondary | ICD-10-CM | POA: Diagnosis not present

## 2013-08-06 DIAGNOSIS — Z8673 Personal history of transient ischemic attack (TIA), and cerebral infarction without residual deficits: Secondary | ICD-10-CM | POA: Diagnosis not present

## 2013-08-06 DIAGNOSIS — I2510 Atherosclerotic heart disease of native coronary artery without angina pectoris: Secondary | ICD-10-CM | POA: Diagnosis not present

## 2013-08-06 DIAGNOSIS — I4891 Unspecified atrial fibrillation: Secondary | ICD-10-CM | POA: Diagnosis not present

## 2013-08-06 LAB — COMPREHENSIVE METABOLIC PANEL
ALT: 22 U/L (ref 0–53)
AST: 41 U/L — ABNORMAL HIGH (ref 0–37)
Alkaline Phosphatase: 67 U/L (ref 39–117)
CO2: 23 mEq/L (ref 19–32)
Calcium: 9 mg/dL (ref 8.4–10.5)
Chloride: 106 mEq/L (ref 96–112)
Creatinine, Ser: 1.26 mg/dL (ref 0.50–1.35)
GFR calc Af Amer: 59 mL/min — ABNORMAL LOW (ref >90)
GFR calc non Af Amer: 51 mL/min — ABNORMAL LOW (ref >90)
Potassium: 4.2 mEq/L (ref 3.5–5.1)
Sodium: 140 mEq/L (ref 135–145)
Total Bilirubin: 0.1 mg/dL — ABNORMAL LOW (ref 0.3–1.2)

## 2013-08-06 LAB — CBC WITH DIFFERENTIAL/PLATELET
Eosinophils Relative: 5 % (ref 0–5)
Lymphocytes Relative: 37 % (ref 12–46)
Lymphs Abs: 2.6 10*3/uL (ref 0.7–4.0)
MCHC: 33.4 g/dL (ref 30.0–36.0)
MCV: 75.9 fL — ABNORMAL LOW (ref 78.0–100.0)
Monocytes Relative: 10 % (ref 3–12)
Neutro Abs: 3.3 10*3/uL (ref 1.7–7.7)
Platelets: 217 10*3/uL (ref 150–400)
RBC: 4.93 MIL/uL (ref 4.22–5.81)
RDW: 15.2 % (ref 11.5–15.5)
WBC: 6.9 10*3/uL (ref 4.0–10.5)

## 2013-08-06 LAB — TROPONIN I: Troponin I: 3.9 ng/mL (ref <0.30)

## 2013-08-06 LAB — TSH: TSH: 1.535 u[IU]/mL (ref 0.350–4.500)

## 2013-08-06 LAB — HEPARIN LEVEL (UNFRACTIONATED): Heparin Unfractionated: 0.74 IU/mL — ABNORMAL HIGH (ref 0.30–0.70)

## 2013-08-06 MED ORDER — HEPARIN (PORCINE) IN NACL 100-0.45 UNIT/ML-% IJ SOLN
1100.0000 [IU]/h | INTRAMUSCULAR | Status: DC
  Administered 2013-08-06 – 2013-08-07 (×2): 1100 [IU]/h via INTRAVENOUS
  Filled 2013-08-06 (×4): qty 250

## 2013-08-06 MED ORDER — SODIUM CHLORIDE 0.9 % IV SOLN
INTRAVENOUS | Status: DC
  Administered 2013-08-07: 08:00:00 via INTRAVENOUS

## 2013-08-06 MED ORDER — ASPIRIN 81 MG PO CHEW
81.0000 mg | CHEWABLE_TABLET | Freq: Every day | ORAL | Status: DC
  Administered 2013-08-06 – 2013-08-07 (×2): 81 mg via ORAL
  Filled 2013-08-06 (×3): qty 1

## 2013-08-06 NOTE — Progress Notes (Signed)
The patient's troponin level came back at 3.90.  The previous troponin level was 1.36.  The MD on call was notified.

## 2013-08-06 NOTE — Progress Notes (Signed)
SUBJECTIVE:  No further CP  OBJECTIVE:   Vitals:   Filed Vitals:   08/05/13 2132 08/05/13 2153 08/06/13 0207 08/06/13 0747  BP: 142/94 150/81 122/72 138/87  Pulse:  90 75 92  Temp:  97.7 F (36.5 C) 97.8 F (36.6 C) 97.6 F (36.4 C)  TempSrc:  Oral Oral Oral  Resp: 19 18 18 18   Height:  5\' 7"  (1.702 m)    Weight:  188 lb (85.276 kg)  189 lb 1.6 oz (85.775 kg)  SpO2: 99% 98% 96% 100%   I&O's:   Intake/Output Summary (Last 24 hours) at 08/06/13 0940 Last data filed at 08/06/13 0757  Gross per 24 hour  Intake    480 ml  Output   1150 ml  Net   -670 ml   TELEMETRY: Reviewed telemetry pt in afib     PHYSICAL EXAM General: Well developed, well nourished, in no acute distress Head: Eyes PERRLA, No xanthomas.   Normal cephalic and atramatic  Lungs:   Clear bilaterally to auscultation and percussion. Heart:   Irregularly irregular S1 S2 Pulses are 2+ & equal. Abdomen: Bowel sounds are positive, abdomen soft and non-tender without masses  Extremities:   No clubbing, cyanosis or edema.  DP +1 Neuro: Alert and oriented X 3. Psych:  Good affect, responds appropriately   LABS: Basic Metabolic Panel:  Recent Labs  40/98/11 1508 08/05/13 2354  NA 139 140  K 3.8 4.2  CL 102 106  CO2 19 23  GLUCOSE 138* 87  BUN 19 17  CREATININE 1.29 1.26  CALCIUM 9.8 9.0   Liver Function Tests:  Recent Labs  08/05/13 1508 08/05/13 2354  AST 31 41*  ALT 25 22  ALKPHOS 85 67  BILITOT 0.2* 0.1*  PROT 8.2 6.8  ALBUMIN 4.2 3.5    Recent Labs  08/05/13 1508  LIPASE 51   CBC:  Recent Labs  08/05/13 1508 08/05/13 2354  WBC 6.4 6.9  NEUTROABS  --  3.3  HGB 15.0 12.5*  HCT 45.2 37.4*  MCV 76.4* 75.9*  PLT 216 217   Cardiac Enzymes:  Recent Labs  08/05/13 1805 08/05/13 2354 08/06/13 0502  TROPONINI 1.36* 3.90* 2.71*   Coag Panel:   Lab Results  Component Value Date   INR 1.07 03/15/2012   INR 1.02 03/14/2012   INR 0.96 02/12/2011    RADIOLOGY: Dg Chest Port 1  View  08/05/2013   CLINICAL DATA:  Chest pain  EXAM: PORTABLE CHEST - 1 VIEW  COMPARISON:  06/10/2013  FINDINGS: Stable heart, mediastinal, and hilar contours. Atherosclerotic calcification of the thoracic aortic arch. Pulmonary vascularity is normal. Negative for pleural effusion. Bones appear osteopenic. A left shoulder arthroplasty is noted.  IMPRESSION: No acute findings.   Electronically Signed   By: Britta Mccreedy M.D.   On: 08/05/2013 15:36   ASSESSMENT:  1. New onset afib with RVR now HR 70-90bpm after addition of Metoprolol 2. NSTEMI ? Secondary to demand ischemia from #1 but given continued rise in troponin which has now peaked at 3.9 this most likely is due to coronary ischemia from acute coronary syndrome with type I NSTEMI.  He has been transitioned to IV Heparin gtt and ASA. 3. HTN - fair control 4. Recent near syncopal episode   PLAN:  1. Continue amlodipine/losartan/lopressor/ASA/statin/IV Heparin gtt 2. 2D echo to assess LVF  3. Plan for left heart cath on Monday to evaluate coronary anatomy       Quintella Reichert, MD  08/06/2013  9:40 AM

## 2013-08-06 NOTE — Progress Notes (Signed)
Patient resting quietly. Hep IV infusing.

## 2013-08-06 NOTE — Progress Notes (Signed)
The patient did not complain of any chest pain overnight and his vital signs remained stable.

## 2013-08-06 NOTE — Progress Notes (Signed)
The patient arrived to 4E18 from the ED at 2200.  He was oriented to the unit and placed on telemetry.  VS were taken and the patient was assessed.  He is A&Ox4 and does not have any complaints of pain at this time.  He was told that he is on bed rest for now.  The call bell was placed within reach and the bed alarm was turned on.

## 2013-08-06 NOTE — Progress Notes (Signed)
ANTICOAGULATION CONSULT NOTE - Initial Consult  Pharmacy Consult for Heparin  Indication: NSTEMI, on apixaban  Allergies  Allergen Reactions  . Augmentin [Amoxicillin-Pot Clavulanate]     rash  . Penicillins   . Sulfur Rash    Patient Measurements: Height: 5\' 7"  (170.2 cm) Weight: 188 lb (85.276 kg) (scale C) IBW/kg (Calculated) : 66.1 Heparin Dosing Weight: ~83 kg  Vital Signs: Temp: 97.8 F (36.6 C) (11/29 0207) Temp src: Oral (11/29 0207) BP: 122/72 mmHg (11/29 0207) Pulse Rate: 75 (11/29 0207)  Labs:  Recent Labs  08/05/13 1508 08/05/13 1805 08/05/13 2354 08/06/13 0502  HGB 15.0  --  12.5*  --   HCT 45.2  --  37.4*  --   PLT 216  --  217  --   CREATININE 1.29  --  1.26  --   TROPONINI <0.30 1.36* 3.90* 2.71*   Estimated Creatinine Clearance: 45.6 ml/min (by C-G formula based on Cr of 1.26).  Medical History: Past Medical History  Diagnosis Date  . CVA (cerebral vascular accident)   . Broken shoulder   . Cerebrovascular disease   . Sleep apnea   . Shingles     left chest  . Compression fracture   . Hypertension   . Pneumonia   . Atrial fibrillation   . Clavicle fracture   . Syncope   . History of pneumonia   . Complex sleep apnea syndrome     adapt SV titration EEP 6 min 6 and max 15 cm water.   Marland Kitchen PAF (paroxysmal atrial fibrillation) 07/12/2013  . Syncope and collapse 07/12/2013  . TIA (transient ischemic attack) 07/12/2013  . Long term (current) use of anticoagulants 07/12/2013    Eliquis    . Coronary atherosclerosis of native coronary artery 07/12/2013    Angina     Medications:  Last Apixaban Dose at 2325 on 11/28  Assessment: 79 y/o M transitioning from Apixaban to Heparin for NSTEMI, last dose of Apixaban was last night (ok to start heparin now). HL will be inaccurate for up to ~48 hours with Apixaban, so will guide dosing with aPTT, but will check HL for correlation purposes. Other labs as above.   Goal of Therapy:  Heparin level  0.3-0.7 units/ml aPTT 66-102 seconds Monitor platelets by anticoagulation protocol: Yes   Plan:  -Start heparin drip at 1100 units/hr -8 hour aPTT/HL 1500 -Daily CBC/HL/aPTT -Monitor for bleeding  Thank you for allowing me to take part in this patient's care,  Abran Duke, PharmD Clinical Pharmacist Phone: [phone] Pager: [phone] 08/06/2013 7:00 AM

## 2013-08-06 NOTE — Progress Notes (Signed)
Utilization review completed. Dalin Caldera, RN, BSN. 

## 2013-08-06 NOTE — Progress Notes (Signed)
ANTICOAGULATION CONSULT NOTE - Initial Consult  Pharmacy Consult for Heparin  Indication: NSTEMI, on apixaban  Allergies  Allergen Reactions  . Augmentin [Amoxicillin-Pot Clavulanate]     rash  . Penicillins   . Sulfur Rash    Patient Measurements: Height: 5\' 7"  (170.2 cm) Weight: 189 lb 1.6 oz (85.775 kg) (scale C) IBW/kg (Calculated) : 66.1 Heparin Dosing Weight: ~83 kg  Vital Signs: Temp: 97.5 F (36.4 C) (11/29 1353) Temp src: Oral (11/29 1353) BP: 132/67 mmHg (11/29 1353) Pulse Rate: 61 (11/29 1353)  Labs:  Recent Labs  08/05/13 1508  08/05/13 2354 08/06/13 0502 08/06/13 1130 08/06/13 1542  HGB 15.0  --  12.5*  --   --   --   HCT 45.2  --  37.4*  --   --   --   PLT 216  --  217  --   --   --   APTT  --   --   --   --   --  78*  HEPARINUNFRC  --   --   --   --   --  0.74*  CREATININE 1.29  --  1.26  --   --   --   TROPONINI <0.30  < > 3.90* 2.71* 0.96*  --   < > = values in this interval not displayed. Estimated Creatinine Clearance: 45.7 ml/min (by C-G formula based on Cr of 1.26).  Medical History: Past Medical History  Diagnosis Date  . CVA (cerebral vascular accident)   . Broken shoulder   . Cerebrovascular disease   . Sleep apnea   . Shingles     left chest  . Compression fracture   . Hypertension   . Pneumonia   . Atrial fibrillation   . Clavicle fracture   . Syncope   . History of pneumonia   . Complex sleep apnea syndrome     adapt SV titration EEP 6 min 6 and max 15 cm water.   Marland Kitchen PAF (paroxysmal atrial fibrillation) 07/12/2013  . Syncope and collapse 07/12/2013  . TIA (transient ischemic attack) 07/12/2013  . Long term (current) use of anticoagulants 07/12/2013    Eliquis    . Coronary atherosclerosis of native coronary artery 07/12/2013    Angina     Medications:  Last Apixaban Dose at 2325 on 11/28  Assessment: 79 y/o M transitioning from Apixaban to Heparin for NSTEMI, last dose of Apixaban was last night (ok to start heparin  now).   PTT at goal but HL above goal, expected with recent apixaban.  RN reports no problems with infusion or bleeding.    Goal of Therapy:  Heparin level 0.3-0.7 units/ml aPTT 66-102 seconds Monitor platelets by anticoagulation protocol: Yes   Plan:  -Continue heparin drip at 1100 units/hr -Daily CBC/HL/aPTT -Monitor for bleeding   Thank you for allowing pharmacy to be a part of this patients care team.  Lovenia Kim Pharm.D., BCPS Clinical Pharmacist 08/06/2013 4:36 PM Pager: [phone] Phone: [phone]

## 2013-08-06 NOTE — Progress Notes (Addendum)
Call from CMT - pt has converted to 1 degree HB rate 55. Text page to cardiology - notified of change.

## 2013-08-06 NOTE — Progress Notes (Signed)
Cross Cover Note  Troponin rising in a pattern potentially consistent with Type I MI. Although type I vs. II cannot be reliably distinguished via kinetics alone, this patter is suggestive of Type I.    Will transition from apixiban to heparin gtt for NSTEMI. Daily ASA added.   Glori Luis, MD

## 2013-08-07 DIAGNOSIS — I200 Unstable angina: Secondary | ICD-10-CM | POA: Diagnosis not present

## 2013-08-07 DIAGNOSIS — R079 Chest pain, unspecified: Secondary | ICD-10-CM | POA: Diagnosis not present

## 2013-08-07 DIAGNOSIS — Z8673 Personal history of transient ischemic attack (TIA), and cerebral infarction without residual deficits: Secondary | ICD-10-CM | POA: Diagnosis not present

## 2013-08-07 DIAGNOSIS — I4891 Unspecified atrial fibrillation: Secondary | ICD-10-CM | POA: Diagnosis not present

## 2013-08-07 DIAGNOSIS — I2510 Atherosclerotic heart disease of native coronary artery without angina pectoris: Secondary | ICD-10-CM | POA: Diagnosis not present

## 2013-08-07 DIAGNOSIS — F0390 Unspecified dementia without behavioral disturbance: Secondary | ICD-10-CM | POA: Diagnosis not present

## 2013-08-07 DIAGNOSIS — I359 Nonrheumatic aortic valve disorder, unspecified: Secondary | ICD-10-CM

## 2013-08-07 DIAGNOSIS — I2129 ST elevation (STEMI) myocardial infarction involving other sites: Secondary | ICD-10-CM | POA: Diagnosis not present

## 2013-08-07 LAB — HEPARIN LEVEL (UNFRACTIONATED): Heparin Unfractionated: 0.48 IU/mL (ref 0.30–0.70)

## 2013-08-07 LAB — CBC
HCT: 36.4 % — ABNORMAL LOW (ref 39.0–52.0)
MCH: 24.4 pg — ABNORMAL LOW (ref 26.0–34.0)
MCV: 75.8 fL — ABNORMAL LOW (ref 78.0–100.0)
Platelets: 207 10*3/uL (ref 150–400)
RBC: 4.8 MIL/uL (ref 4.22–5.81)
WBC: 6.3 10*3/uL (ref 4.0–10.5)

## 2013-08-07 NOTE — Progress Notes (Signed)
  Echocardiogram 2D Echocardiogram has been performed.  Derek [name] 08/07/2013, 3:50 PM

## 2013-08-07 NOTE — Progress Notes (Signed)
ANTICOAGULATION CONSULT NOTE - Follow Up Consult  Pharmacy Consult for Heparin Indication: NSTEMI  Allergies  Allergen Reactions  . Augmentin [Amoxicillin-Pot Clavulanate]     rash  . Penicillins   . Sulfur Rash    Patient Measurements: Height: 5\' 7"  (170.2 cm) Weight: 186 lb 14.4 oz (84.777 kg) (scale c) IBW/kg (Calculated) : 66.1 Heparin Dosing Weight: 83 kg  Vital Signs: Temp: 97.7 F (36.5 C) (11/30 0429) Temp src: Oral (11/30 0429) BP: 130/80 mmHg (11/30 0429) Pulse Rate: 66 (11/30 0429)  Labs:  Recent Labs  08/05/13 1508  08/05/13 2354 08/06/13 0502 08/06/13 1130 08/06/13 1542 08/07/13 0550  HGB 15.0  --  12.5*  --   --   --  11.7*  HCT 45.2  --  37.4*  --   --   --  36.4*  PLT 216  --  217  --   --   --  207  APTT  --   --   --   --   --  78* 87*  HEPARINUNFRC  --   --   --   --   --  0.74* 0.48  CREATININE 1.29  --  1.26  --   --   --   --   TROPONINI <0.30  < > 3.90* 2.71* 0.96*  --   --   < > = values in this interval not displayed.  Estimated Creatinine Clearance: 45.4 ml/min (by C-G formula based on Cr of 1.26).  Assessment: 79 y/o M transitioning from Apixaban (last dose PM 11/28) to Heparin for NSTEMI.  Anticoagulation: NSTEMI on Heparin (recent Eliquis). New onset afib. No further CP. APTT 87 and HL 0.48 in goal.  Infectious Disease: Afebrile. WBC 6.3  Cardiovascular: New afib converted to NSR. VSS Troponins: 1.36, 3.9, 2.71, 0.96. Meds: Norvasc 10mg , ASA 81mg , Lipitor 40, losartan, metoprolol  Endocrinology: Glucose 87  Gastrointestinal / Nutrition: PPI  Neurology: Aricept  Nephrology: Scr 1.26 with Crl 45   Goal of Therapy:  Heparin level 0.3-0.7 units/ml Monitor platelets by anticoagulation protocol: Yes   Plan:  Continue IV heparin at 1100 units/hr Cath Monday  Misty Stanley Stillinger 08/07/2013,3:17 PM

## 2013-08-07 NOTE — Progress Notes (Signed)
SUBJECTIVE:  No complaints.  Converted to NSR yesterday  OBJECTIVE:   Vitals:   Filed Vitals:   08/06/13 1806 08/06/13 2105 08/07/13 0131 08/07/13 0429  BP: 144/65 146/73 150/97 130/80  Pulse: 71 77 75 66  Temp: 97.6 F (36.4 C) 97.6 F (36.4 C) 97.6 F (36.4 C) 97.7 F (36.5 C)  TempSrc: Oral Oral Oral Oral  Resp: 18 18 18 18   Height:      Weight:    186 lb 14.4 oz (84.777 kg)  SpO2: 100% 99% 99% 99%   I&O's:   Intake/Output Summary (Last 24 hours) at 08/07/13 1027 Last data filed at 08/07/13 1610  Gross per 24 hour  Intake 1043.48 ml  Output   1275 ml  Net -231.52 ml   TELEMETRY: Reviewed telemetry pt in NSR:     PHYSICAL EXAM General: Well developed, well nourished, in no acute distress Head: Eyes PERRLA, No xanthomas.   Normal cephalic and atramatic  Lungs:   Clear bilaterally to auscultation and percussion. Heart:   HRRR S1 S2 Pulses are 2+ & equal. Abdomen: Bowel sounds are positive, abdomen soft and non-tender without masses Extremities:   No clubbing, cyanosis or edema.  DP +1 Neuro: Alert and oriented X 3. Psych:  Good affect, responds appropriately   LABS: Basic Metabolic Panel:  Recent Labs  96/04/54 1508 08/05/13 2354  NA 139 140  K 3.8 4.2  CL 102 106  CO2 19 23  GLUCOSE 138* 87  BUN 19 17  CREATININE 1.29 1.26  CALCIUM 9.8 9.0   Liver Function Tests:  Recent Labs  08/05/13 1508 08/05/13 2354  AST 31 41*  ALT 25 22  ALKPHOS 85 67  BILITOT 0.2* 0.1*  PROT 8.2 6.8  ALBUMIN 4.2 3.5    Recent Labs  08/05/13 1508  LIPASE 51   CBC:  Recent Labs  08/05/13 2354 08/07/13 0550  WBC 6.9 6.3  NEUTROABS 3.3  --   HGB 12.5* 11.7*  HCT 37.4* 36.4*  MCV 75.9* 75.8*  PLT 217 207   Cardiac Enzymes:  Recent Labs  08/05/13 2354 08/06/13 0502 08/06/13 1130  TROPONINI 3.90* 2.71* 0.96*   BNP: No components found with this basename: POCBNP,  D-Dimer: No results found for this basename: DDIMER,  in the last 72  hours Hemoglobin A1C: No results found for this basename: HGBA1C,  in the last 72 hours Fasting Lipid Panel: No results found for this basename: CHOL, HDL, LDLCALC, TRIG, CHOLHDL, LDLDIRECT,  in the last 72 hours Thyroid Function Tests:  Recent Labs  08/05/13 2354  TSH 1.535   Anemia Panel: No results found for this basename: VITAMINB12, FOLATE, FERRITIN, TIBC, IRON, RETICCTPCT,  in the last 72 hours Coag Panel:   Lab Results  Component Value Date   INR 1.07 03/15/2012   INR 1.02 03/14/2012   INR 0.96 02/12/2011    RADIOLOGY: Dg Chest Port 1 View  08/05/2013   CLINICAL DATA:  Chest pain  EXAM: PORTABLE CHEST - 1 VIEW  COMPARISON:  06/10/2013  FINDINGS: Stable heart, mediastinal, and hilar contours. Atherosclerotic calcification of the thoracic aortic arch. Pulmonary vascularity is normal. Negative for pleural effusion. Bones appear osteopenic. A left shoulder arthroplasty is noted.  IMPRESSION: No acute findings.   Electronically Signed   By: Britta Mccreedy M.D.   On: 08/05/2013 15:36   ASSESSMENT:  1. New onset afib with RVR now HR 70-90bpm after addition of Metoprolol - now converted to NSR 2. NSTEMI ? Secondary to  demand ischemia from #1 but given continued rise in troponin which has now peaked at 3.9 this most likely is due to coronary ischemia from acute coronary syndrome with type I NSTEMI. He has been transitioned to IV Heparin gtt and ASA.  3. HTN - fair control  4. Recent near syncopal episode  PLAN:  1. Continue amlodipine/losartan/lopressor/ASA/statin/IV Heparin gtt  2. 2D echo to assess LVF  3. Plan for left heart cath on Monday to evaluate coronary anatomy         Quintella Reichert, MD  08/07/2013  10:27 AM

## 2013-08-08 ENCOUNTER — Other Ambulatory Visit: Payer: Medicare Other

## 2013-08-08 ENCOUNTER — Encounter (HOSPITAL_COMMUNITY): Payer: Self-pay | Admitting: Interventional Cardiology

## 2013-08-08 ENCOUNTER — Encounter (HOSPITAL_COMMUNITY)
Admission: EM | Disposition: A | Discharge status: Home / Self Care | Payer: Self-pay | Source: Home / Self Care | Attending: Cardiology

## 2013-08-08 DIAGNOSIS — F0390 Unspecified dementia without behavioral disturbance: Secondary | ICD-10-CM | POA: Diagnosis not present

## 2013-08-08 DIAGNOSIS — I214 Non-ST elevation (NSTEMI) myocardial infarction: Secondary | ICD-10-CM

## 2013-08-08 DIAGNOSIS — I2510 Atherosclerotic heart disease of native coronary artery without angina pectoris: Secondary | ICD-10-CM | POA: Diagnosis not present

## 2013-08-08 DIAGNOSIS — I4891 Unspecified atrial fibrillation: Secondary | ICD-10-CM | POA: Diagnosis not present

## 2013-08-08 DIAGNOSIS — I2129 ST elevation (STEMI) myocardial infarction involving other sites: Secondary | ICD-10-CM | POA: Diagnosis not present

## 2013-08-08 DIAGNOSIS — Z8673 Personal history of transient ischemic attack (TIA), and cerebral infarction without residual deficits: Secondary | ICD-10-CM | POA: Diagnosis not present

## 2013-08-08 HISTORY — PX: PERCUTANEOUS STENT INTERVENTION: SHX5500

## 2013-08-08 HISTORY — PX: LEFT HEART CATHETERIZATION WITH CORONARY ANGIOGRAM: SHX5451

## 2013-08-08 LAB — BASIC METABOLIC PANEL
BUN: 14 mg/dL (ref 6–23)
CO2: 23 mEq/L (ref 19–32)
Calcium: 9.4 mg/dL (ref 8.4–10.5)
Chloride: 105 mEq/L (ref 96–112)
Creatinine, Ser: 1.17 mg/dL (ref 0.50–1.35)
Glucose, Bld: 95 mg/dL (ref 70–99)
Sodium: 140 mEq/L (ref 135–145)

## 2013-08-08 LAB — PROTIME-INR: Prothrombin Time: 13.5 seconds (ref 11.6–15.2)

## 2013-08-08 LAB — POCT ACTIVATED CLOTTING TIME: Activated Clotting Time: 278 seconds

## 2013-08-08 LAB — CBC
HCT: 39.8 % (ref 39.0–52.0)
Hemoglobin: 13.3 g/dL (ref 13.0–17.0)
MCH: 25.3 pg — ABNORMAL LOW (ref 26.0–34.0)
MCHC: 33.4 g/dL (ref 30.0–36.0)
MCV: 75.7 fL — ABNORMAL LOW (ref 78.0–100.0)
RBC: 5.26 MIL/uL (ref 4.22–5.81)

## 2013-08-08 LAB — PLATELET INHIBITION P2Y12: Platelet Function  P2Y12: 321 [PRU] (ref 194–418)

## 2013-08-08 LAB — HEPARIN LEVEL (UNFRACTIONATED): Heparin Unfractionated: 0.53 IU/mL (ref 0.30–0.70)

## 2013-08-08 SURGERY — LEFT HEART CATHETERIZATION WITH CORONARY ANGIOGRAM
Anesthesia: LOCAL

## 2013-08-08 MED ORDER — HEPARIN (PORCINE) IN NACL 100-0.45 UNIT/ML-% IJ SOLN
1100.0000 [IU]/h | INTRAMUSCULAR | Status: DC
  Administered 2013-08-08: 1100 [IU]/h via INTRAVENOUS
  Filled 2013-08-08 (×2): qty 250

## 2013-08-08 MED ORDER — ACETAMINOPHEN 325 MG PO TABS: 650.0000 mg | ORAL_TABLET | ORAL | Status: DC | PRN

## 2013-08-08 MED ORDER — HEPARIN SODIUM (PORCINE) 1000 UNIT/ML IJ SOLN
INTRAMUSCULAR | Status: AC
  Filled 2013-08-08: qty 1

## 2013-08-08 MED ORDER — SODIUM CHLORIDE 0.9 % IJ SOLN: 3.0000 mL | Freq: Two times a day (BID) | INTRAMUSCULAR | Status: DC

## 2013-08-08 MED ORDER — SODIUM CHLORIDE 0.9 % IJ SOLN: 3.0000 mL | INTRAMUSCULAR | Status: DC | PRN

## 2013-08-08 MED ORDER — ASPIRIN 81 MG PO CHEW
81.0000 mg | CHEWABLE_TABLET | Freq: Every day | ORAL | Status: DC
  Administered 2013-08-09: 81 mg via ORAL
  Filled 2013-08-08: qty 1

## 2013-08-08 MED ORDER — NITROGLYCERIN 0.2 MG/ML ON CALL CATH LAB
INTRAVENOUS | Status: AC
  Filled 2013-08-08: qty 1

## 2013-08-08 MED ORDER — LIDOCAINE HCL (PF) 1 % IJ SOLN
INTRAMUSCULAR | Status: AC
  Filled 2013-08-08: qty 30

## 2013-08-08 MED ORDER — CLOPIDOGREL BISULFATE 75 MG PO TABS
75.0000 mg | ORAL_TABLET | Freq: Every day | ORAL | Status: DC
  Administered 2013-08-09: 75 mg via ORAL
  Filled 2013-08-08: qty 1

## 2013-08-08 MED ORDER — VERAPAMIL HCL 2.5 MG/ML IV SOLN
INTRAVENOUS | Status: AC
  Filled 2013-08-08: qty 2

## 2013-08-08 MED ORDER — DIAZEPAM 2 MG PO TABS
2.0000 mg | ORAL_TABLET | ORAL | Status: AC
  Administered 2013-08-08: 2 mg via ORAL
  Filled 2013-08-08: qty 1

## 2013-08-08 MED ORDER — ASPIRIN 81 MG PO CHEW
81.0000 mg | CHEWABLE_TABLET | ORAL | Status: AC
  Administered 2013-08-08: 09:00:00 81 mg via ORAL
  Filled 2013-08-08: qty 1

## 2013-08-08 MED ORDER — CLOPIDOGREL BISULFATE 300 MG PO TABS
ORAL_TABLET | ORAL | Status: AC
  Filled 2013-08-08: qty 1

## 2013-08-08 MED ORDER — ONDANSETRON HCL 4 MG/2ML IJ SOLN: 4.0000 mg | Freq: Four times a day (QID) | INTRAMUSCULAR | Status: DC | PRN

## 2013-08-08 MED ORDER — SODIUM CHLORIDE 0.9 % IV SOLN: 250.0000 mL | INTRAVENOUS | Status: DC | PRN

## 2013-08-08 MED ORDER — HEPARIN (PORCINE) IN NACL 2-0.9 UNIT/ML-% IJ SOLN
INTRAMUSCULAR | Status: AC
  Filled 2013-08-08: qty 1500

## 2013-08-08 NOTE — Progress Notes (Signed)
ANTICOAGULATION CONSULT NOTE - Follow Up Consult  Pharmacy Consult for Heparin Indication: NSTEMI  Allergies  Allergen Reactions  . Augmentin [Amoxicillin-Pot Clavulanate]     rash  . Penicillins   . Sulfur Rash    Patient Measurements: Height: 5\' 7"  (170.2 cm) Weight: 185 lb (83.915 kg) (c scale) IBW/kg (Calculated) : 66.1 Heparin Dosing Weight: 83 kg  Vital Signs: Temp: 98 F (36.7 C) (12/01 0900) Temp src: Oral (12/01 0900) BP: 152/68 mmHg (12/01 0900) Pulse Rate: 58 (12/01 0900)  Labs:  Recent Labs  08/05/13 1508  08/05/13 2354 08/06/13 0502 08/06/13 1130 08/06/13 1542 08/07/13 0550 08/08/13 0700  HGB 15.0  --  12.5*  --   --   --  11.7* 13.3  HCT 45.2  --  37.4*  --   --   --  36.4* 39.8  PLT 216  --  217  --   --   --  207 230  APTT  --   --   --   --   --  78* 87*  --   LABPROT  --   --   --   --   --   --   --  13.5  INR  --   --   --   --   --   --   --  1.05  HEPARINUNFRC  --   --   --   --   --  0.74* 0.48 0.53  CREATININE 1.29  --  1.26  --   --   --   --  1.17  TROPONINI <0.30  < > 3.90* 2.71* 0.96*  --   --   --   < > = values in this interval not displayed.  Estimated Creatinine Clearance: 48.7 ml/min (by C-G formula based on Cr of 1.17).  Assessment: 79 y/o M on IV heparin for NSTEMI, and afib, heparin level (0.53) is therapeutic this morning on 1100 units/hr, cbc stable, No bleeding noted per chart. For cath this morning. Noted on apixaban PTA (last dose 11/28).   Goal of Therapy:  Heparin level 0.3-0.7 units/ml Monitor platelets by anticoagulation protocol: Yes   Plan:  Continue IV heparin at 1100 units/hr F/u plans after cath  Bayard Hugger, PharmD, BCPS  Clinical Pharmacist  Pager: [phone]   08/08/2013,10:04 AM

## 2013-08-08 NOTE — Progress Notes (Signed)
Pt called early for heart cath. Pt callled his family to make aware. Valium 2 mg po given as ordered D/C heparin gtt IV  on call.

## 2013-08-08 NOTE — Progress Notes (Signed)
ANTICOAGULATION CONSULT NOTE - Follow Up Consult  Pharmacy Consult for heparin Indication: NSTEMI s/p cath  Allergies  Allergen Reactions  . Augmentin [Amoxicillin-Pot Clavulanate]     rash  . Penicillins   . Sulfur Rash    Patient Measurements: Height: 5\' 7"  (170.2 cm) Weight: 185 lb (83.915 kg) (c scale) IBW/kg (Calculated) : 66.1 Heparin Dosing Weight: 83 kg  Vital Signs: Temp: 97.6 F (36.4 C) (12/01 1145) Temp src: Oral (12/01 1145) BP: 171/95 mmHg (12/01 1145) Pulse Rate: 57 (12/01 1145)  Labs:  Recent Labs  08/05/13 1508  08/05/13 2354 08/06/13 0502 08/06/13 1130 08/06/13 1542 08/07/13 0550 08/08/13 0700  HGB 15.0  --  12.5*  --   --   --  11.7* 13.3  HCT 45.2  --  37.4*  --   --   --  36.4* 39.8  PLT 216  --  217  --   --   --  207 230  APTT  --   --   --   --   --  78* 87*  --   LABPROT  --   --   --   --   --   --   --  13.5  INR  --   --   --   --   --   --   --  1.05  HEPARINUNFRC  --   --   --   --   --  0.74* 0.48 0.53  CREATININE 1.29  --  1.26  --   --   --   --  1.17  TROPONINI <0.30  < > 3.90* 2.71* 0.96*  --   --   --   < > = values in this interval not displayed.  Estimated Creatinine Clearance: 48.7 ml/min (by C-G formula based on Cr of 1.17).   Medications:  Scheduled:  . amLODipine  10 mg Oral Daily  . aspirin  81 mg Oral Daily  . aspirin  81 mg Oral Daily  . atorvastatin  40 mg Oral q1800  . [START ON 08/09/2013] clopidogrel  75 mg Oral Q breakfast  . donepezil  10 mg Oral QHS  . losartan  100 mg Oral Daily  . metoprolol tartrate  25 mg Oral BID  . pantoprazole  40 mg Oral Daily  . sodium chloride  3 mL Intravenous Q12H   Infusions:  . heparin      Assessment: 79 yo male with NSTEMI s/p cath will be resumed on heparin therapy at 1815 today per MD's order.  Patient was therapeutic while on heparin drip at 1100 units/hr prior to cath. Goal of Therapy:  Heparin level 0.3-0.7 units/ml Monitor platelets by anticoagulation  protocol: Yes   Plan:  1) Restart heparin drip at 1100 units/hr at 1815.  No bolus 2) Check an 8hr heparin level after drip is restarted 3) Daily heparin level and CBC  Zyir Gassert, Tsz-Yin 08/08/2013,12:57 PM

## 2013-08-08 NOTE — CV Procedure (Signed)
PROCEDURE:  Left heart catheterization with selective coronary angiography, left ventriculogram.  Intravascular ultrasound of the left circumflex. PCI of the left circumflex.  INDICATIONS:  NSTEMI  The risks, benefits, and details of the procedure were explained to the patient.  The patient verbalized understanding and wanted to proceed.  Informed written consent was obtained.  PROCEDURE TECHNIQUE:  After Xylocaine anesthesia a 47F slender sheath was placed in the right radial artery with a single anterior needle wall stick.   Right coronary angiography was done using a Williams right guide catheter.  Left coronary angiography was done using a Judkins L3.5 guide catheter.  IVUS/PCI with CLS 3.0 Guide.  Left ventriculography was nodone using a pigtail catheter.  A TR band was used for hemostasis.   CONTRAST:  Total of 130 cc.  COMPLICATIONS:  None.    HEMODYNAMICS:  Aortic pressure was 164/75; LV pressure was 165/3; LVEDP 7.  There was no gradient between the left ventricle and aorta.    ANGIOGRAPHIC DATA:   The left main coronary artery is widely patent.  The left anterior descending artery is widely patent proximally.  The first diagonal is medium-sized and widely patent proximally. The diagonal bifurcates in the inferior pole has a 99% stenosis. This vessel is very small. The mid to distal LAD has only minimal luminal irregularities.  The left circumflex artery is a large dominant vessel. The first obtuse marginal is long and medium sized and widely patent. The second obtuse marginal is small and patent. There is a third obtuse marginal which is also medium-sized and patent. Between the second and third obtuse marginals, there is an eccentric, hazy stenosis of moderate significance in some views. The last, with significant obtuse marginal has a 50-60% proximal stenosis. The left PDA, has a 95% stenosis in the mid vessel. This vessel is quite small it does not appear to supplied much  territory.  After intracoronary nitroglycerin, there was some increase in the size of the left PDA.  The right coronary artery is a small nondominant vessel which is patent.  LEFT VENTRICULOGRAM:  Left ventricular angiogram was not done.  LVEDP was 7 mmHg.  PCI NARRATIVE: Additional heparin was given intravenously. ACT was checked to ensure that the heparin was therapeutic. A CLS 3.0 guiding catheters using his left main. A pro-water wire was placed across the area disease in the circumflex. The intravascular ultrasound catheter was advanced and a pullback was performed. This revealed a 75% cross sectional area stenosis of the mid circumflex. There was mild disease before and after the stenosis. There also appeared to be a dissection flap at the tightest area of stenosis. This is likely from a plaque rupture causing a contained spontaneous dissection. This likely was the cause of the haziness angiographically. There is also the appearance of a bulge in the vessel angiographically. The lesion was direct stented with a 3.5 x 20 rebel bare-metal stent.  A 3.75 x 12 noncompliant balloon was used to post dilate the stent, inflated to 16 atmospheres. There is no residual stenosis. There is an excellent angiographic result.  IMPRESSIONS:  1. Widely patent left main coronary artery. 2. Widely patent left anterior descending artery.  Small vessel disease noted in a branch off of the diagonal. 3. Intravascular ultrasound shows a 75% mid left circumflex artery lesion, also with dissection from likely plaque rupture.  This area was treated with a 3.5 x 20 bare-metal stent, postdilated to 3.8 mm in diameter. Small vessel  disease noted in the mid to distal left PDA. 4. Small, nondominant right coronary artery which is patent. 5. Left ventricular systolic function not assessed .  LVEDP 7 mmHg.    RECOMMENDATION:  Dual antiplatelet therapy for one month.  Restart heparin later today.  Likely coumadin for one month  and then may switch back to NOAC.  Patient is to followup with Dr. Alanda Amass Katrinka Blazing III.  Aggressive secondary prevention.

## 2013-08-08 NOTE — H&P (View-Only) (Signed)
       Patient Name: ZEESHAN KORTE Date of Encounter: 08/08/2013    SUBJECTIVE: No recurrence of chest pain since AF converted.  TELEMETRY:  NSR: Filed Vitals:   08/07/13 1548 08/07/13 2155 08/08/13 0506 08/08/13 0800  BP: 143/64 162/66 153/79   Pulse: 69 62 65   Temp: 97.3 F (36.3 C) 97.8 F (36.6 C) 97.9 F (36.6 C)   TempSrc: Oral Oral Oral   Resp: 18 18 18 18   Height:      Weight:   185 lb (83.915 kg)   SpO2: 100% 99% 99%     Intake/Output Summary (Last 24 hours) at 08/08/13 0838 Last data filed at 08/08/13 1610  Gross per 24 hour  Intake    480 ml  Output   2725 ml  Net  -2245 ml    LABS: Basic Metabolic Panel:  Recent Labs  96/04/54 2354 08/08/13 0700  NA 140 140  K 4.2 3.9  CL 106 105  CO2 23 23  GLUCOSE 87 95  BUN 17 14  CREATININE 1.26 1.17  CALCIUM 9.0 9.4   CBC:  Recent Labs  08/05/13 2354 08/07/13 0550 08/08/13 0700  WBC 6.9 6.3 7.5  NEUTROABS 3.3  --   --   HGB 12.5* 11.7* 13.3  HCT 37.4* 36.4* 39.8  MCV 75.9* 75.8* 75.7*  PLT 217 207 230   Cardiac Enzymes:  Recent Labs  08/05/13 2354 08/06/13 0502 08/06/13 1130  TROPONINI 3.90* 2.71* 0.96*   Radiology/Studies:  N/A  Physical Exam: Blood pressure 153/79, pulse 65, temperature 97.9 F (36.6 C), temperature source Oral, resp. rate 18, height 5\' 7"  (1.702 m), weight 185 lb (83.915 kg), SpO2 99.00%. Weight change: -4 lb 1.6 oz (-1.86 kg)   Unremarkable  ASSESSMENT:  1. PAF with RVR, now resolved to NSR 2. Dementia 3. Recurrent syncope 4. Elevated troponin, ? NSTEMI related to rhythm  Plan:  1. Cath today.  2. Further management based on anatomy  Signed, Lesleigh Noe 08/08/2013, 8:38 AM

## 2013-08-08 NOTE — Interval H&P Note (Signed)
History and Physical Interval Note:  08/08/2013 9:47 AM  Derek [name]  has presented today for surgery, with the diagnosis of NSTEMI  The various methods of treatment have been discussed with the patient and family. After consideration of risks, benefits and other options for treatment, the patient has consented to  Procedure(s): LEFT HEART CATHETERIZATION WITH CORONARY ANGIOGRAM (N/A) as a surgical intervention .  The patient's history has been reviewed, patient examined, no change in status, stable for surgery.  I have reviewed the patient's chart and labs.  Questions were answered to the patient's satisfaction.     Lynnlee Revels S.

## 2013-08-08 NOTE — Interval H&P Note (Signed)
Cath Lab Visit (complete for each Cath Lab visit)  Clinical Evaluation Leading to the Procedure:   ACS: yes  Non-ACS:    Anginal Classification: CCS IV  Anti-ischemic medical therapy: Minimal Therapy (1 class of medications)  Non-Invasive Test Results: No non-invasive testing performed  Prior CABG: No previous CABG        History and Physical Interval Note:  08/08/2013 9:50 AM  Derek [name]  has presented today for surgery, with the diagnosis of NSTEMI  The various methods of treatment have been discussed with the patient and family. After consideration of risks, benefits and other options for treatment, the patient has consented to  Procedure(s): LEFT HEART CATHETERIZATION WITH CORONARY ANGIOGRAM (N/A) as a surgical intervention .  The patient's history has been reviewed, patient examined, no change in status, stable for surgery.  I have reviewed the patient's chart and labs.  Questions were answered to the patient's satisfaction.     VARANASI,JAYADEEP S.

## 2013-08-08 NOTE — Progress Notes (Signed)
       Patient Name: Derek [name] Date of Encounter: 08/08/2013    SUBJECTIVE: No recurrence of chest pain since AF converted.  TELEMETRY:  NSR: Filed Vitals:   08/07/13 1548 08/07/13 2155 08/08/13 0506 08/08/13 0800  BP: 143/64 162/66 153/79   Pulse: 69 62 65   Temp: 97.3 F (36.3 C) 97.8 F (36.6 C) 97.9 F (36.6 C)   TempSrc: Oral Oral Oral   Resp: 18 18 18 18  Height:      Weight:   185 lb (83.915 kg)   SpO2: 100% 99% 99%     Intake/Output Summary (Last 24 hours) at 08/08/13 0838 Last data filed at 08/08/13 0229  Gross per 24 hour  Intake    480 ml  Output   2725 ml  Net  -2245 ml    LABS: Basic Metabolic Panel:  Recent Labs  08/05/13 2354 08/08/13 0700  NA 140 140  K 4.2 3.9  CL 106 105  CO2 23 23  GLUCOSE 87 95  BUN 17 14  CREATININE 1.26 1.17  CALCIUM 9.0 9.4   CBC:  Recent Labs  08/05/13 2354 08/07/13 0550 08/08/13 0700  WBC 6.9 6.3 7.5  NEUTROABS 3.3  --   --   HGB 12.5* 11.7* 13.3  HCT 37.4* 36.4* 39.8  MCV 75.9* 75.8* 75.7*  PLT 217 207 230   Cardiac Enzymes:  Recent Labs  08/05/13 2354 08/06/13 0502 08/06/13 1130  TROPONINI 3.90* 2.71* 0.96*   Radiology/Studies:  N/A  Physical Exam: Blood pressure 153/79, pulse 65, temperature 97.9 F (36.6 C), temperature source Oral, resp. rate 18, height 5' 7" (1.702 m), weight 185 lb (83.915 kg), SpO2 99.00%. Weight change: -4 lb 1.6 oz (-1.86 kg)   Unremarkable  ASSESSMENT:  1. PAF with RVR, now resolved to NSR 2. Dementia 3. Recurrent syncope 4. Elevated troponin, ? NSTEMI related to rhythm  Plan:  1. Cath today.  2. Further management based on anatomy  Signed, Clayden Withem III,Venisa Frampton W 08/08/2013, 8:38 AM 

## 2013-08-08 NOTE — Progress Notes (Signed)
TR BAND REMOVAL  LOCATION:    right radial  DEFLATED PER PROTOCOL:    yes  TIME BAND OFF / DRESSING APPLIED:    1500   SITE UPON ARRIVAL:    Level 0  SITE AFTER BAND REMOVAL:    Level 0  REVERSE ALLEN'S TEST:     positive  CIRCULATION SENSATION AND MOVEMENT:    Within Normal Limits   yes  COMMENTS:   Tolerated procedure well 

## 2013-08-08 NOTE — Progress Notes (Signed)
Daughter given all pt's personal items including cell phone,  charger, & glasses. Pt is having room change post cath.

## 2013-08-09 DIAGNOSIS — G4733 Obstructive sleep apnea (adult) (pediatric): Secondary | ICD-10-CM

## 2013-08-09 DIAGNOSIS — I2129 ST elevation (STEMI) myocardial infarction involving other sites: Principal | ICD-10-CM

## 2013-08-09 DIAGNOSIS — Z8673 Personal history of transient ischemic attack (TIA), and cerebral infarction without residual deficits: Secondary | ICD-10-CM | POA: Diagnosis not present

## 2013-08-09 DIAGNOSIS — F0390 Unspecified dementia without behavioral disturbance: Secondary | ICD-10-CM | POA: Diagnosis not present

## 2013-08-09 DIAGNOSIS — Z7901 Long term (current) use of anticoagulants: Secondary | ICD-10-CM | POA: Diagnosis not present

## 2013-08-09 DIAGNOSIS — I4891 Unspecified atrial fibrillation: Secondary | ICD-10-CM | POA: Diagnosis not present

## 2013-08-09 LAB — BASIC METABOLIC PANEL
CO2: 23 mEq/L (ref 19–32)
Chloride: 101 mEq/L (ref 96–112)
Creatinine, Ser: 1.33 mg/dL (ref 0.50–1.35)
GFR calc Af Amer: 55 mL/min — ABNORMAL LOW (ref >90)
GFR calc non Af Amer: 47 mL/min — ABNORMAL LOW (ref >90)
Potassium: 3.6 mEq/L (ref 3.5–5.1)

## 2013-08-09 LAB — CBC
HCT: 37.3 % — ABNORMAL LOW (ref 39.0–52.0)
MCHC: 33.5 g/dL (ref 30.0–36.0)
MCV: 74.7 fL — ABNORMAL LOW (ref 78.0–100.0)
Platelets: 207 10*3/uL (ref 150–400)
RBC: 4.99 MIL/uL (ref 4.22–5.81)
RDW: 15.3 % (ref 11.5–15.5)
WBC: 7 10*3/uL (ref 4.0–10.5)

## 2013-08-09 MED ORDER — METOPROLOL TARTRATE 25 MG PO TABS: 25.0000 mg | ORAL_TABLET | Freq: Two times a day (BID) | ORAL | Status: DC

## 2013-08-09 MED ORDER — NITROGLYCERIN 0.4 MG SL SUBL: 0.4000 mg | SUBLINGUAL_TABLET | SUBLINGUAL | Status: DC | PRN

## 2013-08-09 MED ORDER — WARFARIN - PHYSICIAN DOSING INPATIENT: Freq: Every day | Status: DC

## 2013-08-09 MED ORDER — WARFARIN SODIUM 5 MG PO TABS
5.0000 mg | ORAL_TABLET | Freq: Once | ORAL | Status: AC
  Administered 2013-08-09: 09:00:00 5 mg via ORAL
  Filled 2013-08-09: qty 1

## 2013-08-09 MED ORDER — HEART ATTACK BOUNCING BOOK
Freq: Once | Status: AC
  Administered 2013-08-09: 06:00:00
  Filled 2013-08-09: qty 1

## 2013-08-09 MED ORDER — ASPIRIN 81 MG PO CHEW: 81.0000 mg | CHEWABLE_TABLET | Freq: Every day | ORAL | Status: DC

## 2013-08-09 MED ORDER — CLOPIDOGREL BISULFATE 75 MG PO TABS: 75.0000 mg | ORAL_TABLET | Freq: Every day | ORAL | Status: DC

## 2013-08-09 MED ORDER — ATORVASTATIN CALCIUM 40 MG PO TABS: 40.0000 mg | ORAL_TABLET | Freq: Every day | ORAL | Status: DC

## 2013-08-09 MED ORDER — WARFARIN SODIUM 2 MG PO TABS: 2.0000 mg | ORAL_TABLET | Freq: Once | ORAL | Status: DC

## 2013-08-09 NOTE — Progress Notes (Signed)
CARDIAC REHAB PHASE I   PRE:  Rate/Rhythm: 59SB  BP:  Supine:   Sitting: 147/62  Standing:    SaO2:   MODE:  Ambulation: 500 ft   POST:  Rate/Rhythm: 72SR PACs  BP:  Supine:   Sitting: 125/61  Standing:    SaO2:  0900-0956 Pt walked 500 ft  With hand held asst. Gait steady. Denied CP or dizziness. Education completed with pt voicing understanding. Gave heart healthy diet for pt to review. Discussed dark green leafy vegetables with Coumadin. Pt unaware of food restrictions. Discussed with RN getting pharmacist to see as pt had questions re if a glass of wine would be ok and eats spinach on sandwich daily. Discussed MI restrictions. Discussed CRP 2 and pt gave permission to refer to [place] program.   Luetta Nutting, RN BSN  08/09/2013 9:53 AM

## 2013-08-09 NOTE — Progress Notes (Signed)
ANTICOAGULATION CONSULT NOTE - Follow Up Consult  Pharmacy Consult for Heparin  Indication: NSTEMI, s/p cath  Allergies  Allergen Reactions  . Augmentin [Amoxicillin-Pot Clavulanate]     rash  . Penicillins   . Sulfur Rash    Patient Measurements: Height: 5\' 7"  (170.2 cm) Weight: 185 lb 10 oz (84.2 kg) IBW/kg (Calculated) : 66.1  Vital Signs: Temp: 97.9 F (36.6 C) (12/02 0437) Temp src: Oral (12/02 0437) BP: 160/69 mmHg (12/02 0437) Pulse Rate: 65 (12/02 0437)  Labs:  Recent Labs  08/06/13 1130  08/06/13 1542  08/07/13 0550 08/08/13 0700 08/09/13 0545  HGB  --   --   --   < > 11.7* 13.3 12.5*  HCT  --   --   --   --  36.4* 39.8 37.3*  PLT  --   --   --   --  207 230 207  APTT  --   --  78*  --  87*  --   --   LABPROT  --   --   --   --   --  13.5  --   INR  --   --   --   --   --  1.05  --   HEPARINUNFRC  --   < > 0.74*  --  0.48 0.53 0.42  CREATININE  --   --   --   --   --  1.17 1.33  TROPONINI 0.96*  --   --   --   --   --   --   < > = values in this interval not displayed.  Estimated Creatinine Clearance: 42.9 ml/min (by C-G formula based on Cr of 1.33).  Medications:  Heparin 1100 units/hr  Assessment: 79 y/o M s/p cath, heparin was restarted yesterday evening and HL this AM is 0.42. Other labs as above.   Goal of Therapy:  Heparin level 0.3-0.7 units/ml Monitor platelets by anticoagulation protocol: Yes   Plan:  -Continue heparin drip at 1100 units/hr -Daily CBC/HL -Monitor for bleeding -F/U heparin plans now that pt is s/p cath  Thank you for allowing me to take part in this patient's care,  Abran Duke, PharmD Clinical Pharmacist Phone: [phone] Pager: [phone] 08/09/2013 6:51 AM

## 2013-08-09 NOTE — Progress Notes (Addendum)
Lopressor off schedule due to cath, given at 1500, tele shows SB 49-54.

## 2013-08-09 NOTE — Progress Notes (Signed)
BP 160/69, SR 60, Lopressor given at this time.
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
## 2013-08-12 ENCOUNTER — Ambulatory Visit (INDEPENDENT_AMBULATORY_CARE_PROVIDER_SITE_OTHER): Payer: Medicare Other | Admitting: *Deleted

## 2013-08-12 ENCOUNTER — Encounter (INDEPENDENT_AMBULATORY_CARE_PROVIDER_SITE_OTHER): Payer: Self-pay

## 2013-08-12 DIAGNOSIS — I219 Acute myocardial infarction, unspecified: Secondary | ICD-10-CM | POA: Diagnosis not present

## 2013-08-12 DIAGNOSIS — I4891 Unspecified atrial fibrillation: Secondary | ICD-10-CM

## 2013-08-12 DIAGNOSIS — I480 Paroxysmal atrial fibrillation: Secondary | ICD-10-CM | POA: Insufficient documentation

## 2013-08-12 DIAGNOSIS — Z7901 Long term (current) use of anticoagulants: Secondary | ICD-10-CM

## 2013-08-12 NOTE — Patient Instructions (Signed)

## 2013-08-15 DIAGNOSIS — Z961 Presence of intraocular lens: Secondary | ICD-10-CM | POA: Diagnosis not present

## 2013-08-15 DIAGNOSIS — H04129 Dry eye syndrome of unspecified lacrimal gland: Secondary | ICD-10-CM | POA: Diagnosis not present

## 2013-08-15 DIAGNOSIS — H353190 Nonexudative age-related macular degeneration, unspecified eye, stage unspecified: Secondary | ICD-10-CM | POA: Diagnosis not present

## 2013-08-15 DIAGNOSIS — H4011X0 Primary open-angle glaucoma, stage unspecified: Secondary | ICD-10-CM | POA: Diagnosis not present

## 2013-08-16 ENCOUNTER — Ambulatory Visit (INDEPENDENT_AMBULATORY_CARE_PROVIDER_SITE_OTHER): Payer: Medicare Other | Admitting: General Practice

## 2013-08-16 DIAGNOSIS — Z7901 Long term (current) use of anticoagulants: Secondary | ICD-10-CM | POA: Diagnosis not present

## 2013-08-16 DIAGNOSIS — I4891 Unspecified atrial fibrillation: Secondary | ICD-10-CM

## 2013-08-16 DIAGNOSIS — I219 Acute myocardial infarction, unspecified: Secondary | ICD-10-CM

## 2013-08-16 LAB — POCT INR: INR: 1

## 2013-08-22 ENCOUNTER — Ambulatory Visit (INDEPENDENT_AMBULATORY_CARE_PROVIDER_SITE_OTHER): Payer: Medicare Other | Admitting: General Practice

## 2013-08-22 ENCOUNTER — Ambulatory Visit (INDEPENDENT_AMBULATORY_CARE_PROVIDER_SITE_OTHER): Payer: Medicare Other | Admitting: Interventional Cardiology

## 2013-08-22 ENCOUNTER — Encounter: Payer: Self-pay | Admitting: Interventional Cardiology

## 2013-08-22 VITALS — BP 134/72 | HR 70 | Ht 70.0 in | Wt 195.8 lb

## 2013-08-22 DIAGNOSIS — I2510 Atherosclerotic heart disease of native coronary artery without angina pectoris: Secondary | ICD-10-CM | POA: Diagnosis not present

## 2013-08-22 DIAGNOSIS — R55 Syncope and collapse: Secondary | ICD-10-CM | POA: Diagnosis not present

## 2013-08-22 DIAGNOSIS — Z7901 Long term (current) use of anticoagulants: Secondary | ICD-10-CM

## 2013-08-22 DIAGNOSIS — L905 Scar conditions and fibrosis of skin: Secondary | ICD-10-CM | POA: Diagnosis not present

## 2013-08-22 DIAGNOSIS — I4891 Unspecified atrial fibrillation: Secondary | ICD-10-CM | POA: Diagnosis not present

## 2013-08-22 DIAGNOSIS — I2129 ST elevation (STEMI) myocardial infarction involving other sites: Secondary | ICD-10-CM | POA: Diagnosis not present

## 2013-08-22 DIAGNOSIS — I219 Acute myocardial infarction, unspecified: Secondary | ICD-10-CM

## 2013-08-22 MED ORDER — METOPROLOL TARTRATE 25 MG PO TABS: 25.0000 mg | ORAL_TABLET | Freq: Two times a day (BID) | ORAL | Status: DC

## 2013-08-22 MED ORDER — WARFARIN SODIUM 2 MG PO TABS: ORAL_TABLET | ORAL | Status: DC

## 2013-08-22 MED ORDER — CLOPIDOGREL BISULFATE 75 MG PO TABS: 75.0000 mg | ORAL_TABLET | Freq: Every day | ORAL | Status: DC

## 2013-08-22 MED ORDER — ATORVASTATIN CALCIUM 40 MG PO TABS: 40.0000 mg | ORAL_TABLET | Freq: Every day | ORAL | Status: DC

## 2013-08-22 NOTE — Patient Instructions (Addendum)
Your physician recommends that you continue on your current medications as directed. Please refer to the Current Medication list given to you today.  Your medications have been refilled today  Your physician wants you to follow-up in: 3 months You will receive a reminder letter in the mail two months in advance. If you don't receive a letter, please call our office to schedule the follow-up appointment.  Follow up with the coumadin clinic for instructions on transitioning back  to Eliquis(Apixaban)

## 2013-08-22 NOTE — Progress Notes (Signed)
Patient ID: Derek [name], male   DOB: 1927/07/20, 79 y.o.   MRN: [id number]    [address] [place], [place]  [address] Phone: [phone] Fax:  [phone]  Date:  08/22/2013   ID:  Derek [name], DOB Jun 16, 1927, MRN [id number]  PCP:  Katy Apo, MD   ASSESSMENT:  1. Non-ST elevation MI, treated with bare-metal stent, circumflex. 2. CAD 3. Hypertension 4. Paroxysmal atrial fib 5. Complex anticoagulation/antiplatelet therapy with Coumadin and Plavix  PLAN:  1. Continue Plavix and Coumadin until mid January at which time it will be safe to return to Eliquis without antiplatelet therapy. 2. Clinical followup in 2 months 3. Call if clinical problems   SUBJECTIVE: Derek [name] is a 79 y.o. male who denies angina after a recent non-ST elevation MI caused by circumflex occlusion. Since discharge from the hospital he has had no difficulty. He received a non-drug-coated stent. This was performed by Dr. very nicely. The patient presented with atrial fibrillation and rapid ventricular response. He's had no recurrence of palpitations or pain. He has not noticed bleeding on Plavix and Coumadin.   Wt Readings from Last 3 Encounters:  08/22/13 195 lb 12.8 oz (88.814 kg)  08/09/13 185 lb 10 oz (84.2 kg)  08/09/13 185 lb 10 oz (84.2 kg)     Past Medical History  Diagnosis Date  . CVA (cerebral vascular accident)   . Broken shoulder   . Cerebrovascular disease   . Sleep apnea   . Shingles     left chest  . Compression fracture   . Hypertension   . Pneumonia   . Atrial fibrillation   . Clavicle fracture   . Syncope   . History of pneumonia   . Complex sleep apnea syndrome     adapt SV titration EEP 6 min 6 and max 15 cm water.   Marland Kitchen PAF (paroxysmal atrial fibrillation) 07/12/2013  . Syncope and collapse 07/12/2013  . TIA (transient ischemic attack) 07/12/2013  . Long term (current) use of anticoagulants 07/12/2013    Eliquis    . Coronary  atherosclerosis of native coronary artery 07/12/2013    Angina   . Acute myocardial infarction of other lateral wall, initial episode of care     Current Outpatient Prescriptions  Medication Sig Dispense Refill  . amLODipine (NORVASC) 10 MG tablet Take 10 mg by mouth daily.      Marland Kitchen aspirin 81 MG chewable tablet Chew 1 tablet (81 mg total) by mouth daily.      Marland Kitchen atorvastatin (LIPITOR) 40 MG tablet Take 1 tablet (40 mg total) by mouth daily at 6 PM.  30 tablet  11  . cholecalciferol (VITAMIN D) 1000 UNITS tablet Take 1,000 Units by mouth daily.      . clopidogrel (PLAVIX) 75 MG tablet Take 1 tablet (75 mg total) by mouth daily with breakfast.  30 tablet  3  . Coenzyme Q10 (COQ10) 100 MG CAPS Take 1 capsule by mouth daily.       Marland Kitchen donepezil (ARICEPT) 10 MG tablet Take 10 mg by mouth at bedtime.      Marland Kitchen losartan (COZAAR) 100 MG tablet Take 100 mg by mouth daily.      . LUTEIN PO Take by mouth.      . metoprolol tartrate (LOPRESSOR) 25 MG tablet Take 1 tablet (25 mg total) by mouth 2 (two) times daily.  60 tablet  11  . Multiple Vitamins-Minerals (CENTRUM SILVER PO) Take 1  tablet by mouth daily.       . nitroGLYCERIN (NITROSTAT) 0.4 MG SL tablet Place 1 tablet (0.4 mg total) under the tongue every 5 (five) minutes x 3 doses as needed for chest pain.  25 tablet  1  . omeprazole (PRILOSEC) 20 MG capsule Take 20 mg by mouth daily.      Marland Kitchen warfarin (COUMADIN) 2 MG tablet Take 1 tablet (2 mg total) by mouth one time only at 6 PM.  30 tablet  6   No current facility-administered medications for this visit.    Allergies:    Allergies  Allergen Reactions  . Augmentin [Amoxicillin-Pot Clavulanate]     rash  . Penicillins   . Sulfur Rash    Social History:  The patient  reports that he has never smoked. He has never used smokeless tobacco. He reports that he drinks about 0.6 ounces of alcohol per week. He reports that he does not use illicit drugs.   ROS:  Please see the history of present illness.       All other systems reviewed and negative.   OBJECTIVE: VS:  BP 134/72  Pulse 70  Ht 5\' 10"  (1.778 m)  Wt 195 lb 12.8 oz (88.814 kg)  BMI 28.09 kg/m2 Well nourished, well developed, in no acute distress, elderly HEENT: normal Neck: JVD flat. Carotid bruit absent  Cardiac:  normal S1, S2; RRR; no murmur Lungs:  clear to auscultation bilaterally, no wheezing, rhonchi or rales Abd: soft, nontender, no hepatomegaly Ext: Edema absent. Pulses 2+ Skin: warm and dry Neuro:  CNs 2-12 intact, no focal abnormalities noted  EKG:  Not repeated       Signed, Darci Needle III, MD 08/22/2013 3:32 PM

## 2013-08-29 ENCOUNTER — Ambulatory Visit (INDEPENDENT_AMBULATORY_CARE_PROVIDER_SITE_OTHER): Payer: Medicare Other

## 2013-08-29 DIAGNOSIS — I219 Acute myocardial infarction, unspecified: Secondary | ICD-10-CM

## 2013-08-29 DIAGNOSIS — Z7901 Long term (current) use of anticoagulants: Secondary | ICD-10-CM | POA: Diagnosis not present

## 2013-08-29 DIAGNOSIS — I4891 Unspecified atrial fibrillation: Secondary | ICD-10-CM | POA: Diagnosis not present

## 2013-09-02 ENCOUNTER — Ambulatory Visit (INDEPENDENT_AMBULATORY_CARE_PROVIDER_SITE_OTHER): Payer: Medicare Other | Admitting: Pharmacist

## 2013-09-02 DIAGNOSIS — I219 Acute myocardial infarction, unspecified: Secondary | ICD-10-CM | POA: Diagnosis not present

## 2013-09-02 DIAGNOSIS — I4891 Unspecified atrial fibrillation: Secondary | ICD-10-CM | POA: Diagnosis not present

## 2013-09-02 DIAGNOSIS — Z7901 Long term (current) use of anticoagulants: Secondary | ICD-10-CM

## 2013-09-02 MED ORDER — WARFARIN SODIUM 2 MG PO TABS: ORAL_TABLET | ORAL | Status: DC

## 2013-09-12 ENCOUNTER — Ambulatory Visit (INDEPENDENT_AMBULATORY_CARE_PROVIDER_SITE_OTHER): Payer: Medicare Other | Admitting: *Deleted

## 2013-09-12 DIAGNOSIS — I219 Acute myocardial infarction, unspecified: Secondary | ICD-10-CM

## 2013-09-12 DIAGNOSIS — Z7901 Long term (current) use of anticoagulants: Secondary | ICD-10-CM

## 2013-09-12 DIAGNOSIS — I4891 Unspecified atrial fibrillation: Secondary | ICD-10-CM

## 2013-09-14 ENCOUNTER — Telehealth: Payer: Self-pay | Admitting: Interventional Cardiology

## 2013-09-14 NOTE — Telephone Encounter (Signed)
Returned call to [place] office.pt will need additonal oral procdeures in the upcoming months(3-6). Dr.Lanier will fax over cardiac clearance request and a request  for Dr.Smith recommendations on holding plavix and coumadin.

## 2013-09-14 NOTE — Telephone Encounter (Signed)
New problem   Dr Madilyn Fireman need you to call her.

## 2013-09-14 NOTE — Telephone Encounter (Signed)
New message      Pt is in this office and the nurse needs to know if he is cleared for this teeth cleaning does he need to be pre-medicated and is it ok for him to have this cleaning?

## 2013-09-14 NOTE — Telephone Encounter (Signed)
[email or web address] for pt to have cleaning.pt does not need hold coumadin or prophalaxis.

## 2013-09-19 ENCOUNTER — Ambulatory Visit (INDEPENDENT_AMBULATORY_CARE_PROVIDER_SITE_OTHER): Payer: Medicare Other | Admitting: *Deleted

## 2013-09-19 DIAGNOSIS — Z7901 Long term (current) use of anticoagulants: Secondary | ICD-10-CM

## 2013-09-19 DIAGNOSIS — I4891 Unspecified atrial fibrillation: Secondary | ICD-10-CM | POA: Diagnosis not present

## 2013-09-19 DIAGNOSIS — I219 Acute myocardial infarction, unspecified: Secondary | ICD-10-CM

## 2013-09-19 LAB — BASIC METABOLIC PANEL
BUN: 23 mg/dL (ref 6–23)
CO2: 26 mEq/L (ref 19–32)
CREATININE: 1.6 mg/dL — AB (ref 0.4–1.5)
Calcium: 9.4 mg/dL (ref 8.4–10.5)
Chloride: 110 mEq/L (ref 96–112)
GFR: 42.71 mL/min — AB (ref >60.00)
Glucose, Bld: 106 mg/dL — ABNORMAL HIGH (ref 70–99)
POTASSIUM: 4.4 meq/L (ref 3.5–5.1)
Sodium: 142 mEq/L (ref 135–145)

## 2013-09-19 LAB — POCT INR: INR: 3.6

## 2013-09-19 NOTE — Patient Instructions (Addendum)
09/19/13- No Coumadin today or Eliquis,  draw Kidney function labs, this will determine dose of Eliquis  09/20/13- No Coumadin or Eliquis  09/21/13- Start Eliquis, dose will be called to you after lab results are back. Discontinue Plavix after starting Eliquis.  Will see you back in one month after starting Eliquis.

## 2013-09-20 DIAGNOSIS — H26499 Other secondary cataract, unspecified eye: Secondary | ICD-10-CM | POA: Diagnosis not present

## 2013-09-20 DIAGNOSIS — H35379 Puckering of macula, unspecified eye: Secondary | ICD-10-CM | POA: Diagnosis not present

## 2013-09-20 DIAGNOSIS — H43819 Vitreous degeneration, unspecified eye: Secondary | ICD-10-CM | POA: Diagnosis not present

## 2013-09-20 DIAGNOSIS — H353190 Nonexudative age-related macular degeneration, unspecified eye, stage unspecified: Secondary | ICD-10-CM | POA: Diagnosis not present

## 2013-09-26 ENCOUNTER — Telehealth: Payer: Self-pay

## 2013-09-26 NOTE — Telephone Encounter (Signed)
Signed [place] cardiac rehab form faxed 09/23/13 

## 2013-09-28 DIAGNOSIS — N183 Chronic kidney disease, stage 3 unspecified: Secondary | ICD-10-CM | POA: Diagnosis not present

## 2013-09-28 DIAGNOSIS — F0390 Unspecified dementia without behavioral disturbance: Secondary | ICD-10-CM | POA: Diagnosis not present

## 2013-09-28 DIAGNOSIS — I10 Essential (primary) hypertension: Secondary | ICD-10-CM | POA: Diagnosis not present

## 2013-09-28 DIAGNOSIS — I2510 Atherosclerotic heart disease of native coronary artery without angina pectoris: Secondary | ICD-10-CM | POA: Diagnosis not present

## 2013-09-30 ENCOUNTER — Ambulatory Visit (INDEPENDENT_AMBULATORY_CARE_PROVIDER_SITE_OTHER): Payer: Medicare Other | Admitting: Nurse Practitioner

## 2013-09-30 ENCOUNTER — Encounter: Payer: Self-pay | Admitting: Nurse Practitioner

## 2013-09-30 ENCOUNTER — Encounter (INDEPENDENT_AMBULATORY_CARE_PROVIDER_SITE_OTHER): Payer: Self-pay

## 2013-09-30 VITALS — BP 135/71 | HR 58 | Ht 68.5 in | Wt 192.0 lb

## 2013-09-30 DIAGNOSIS — Z8673 Personal history of transient ischemic attack (TIA), and cerebral infarction without residual deficits: Secondary | ICD-10-CM | POA: Diagnosis not present

## 2013-09-30 DIAGNOSIS — N183 Chronic kidney disease, stage 3 unspecified: Secondary | ICD-10-CM | POA: Diagnosis not present

## 2013-09-30 DIAGNOSIS — F0390 Unspecified dementia without behavioral disturbance: Secondary | ICD-10-CM | POA: Diagnosis not present

## 2013-09-30 DIAGNOSIS — G459 Transient cerebral ischemic attack, unspecified: Secondary | ICD-10-CM

## 2013-09-30 DIAGNOSIS — G4733 Obstructive sleep apnea (adult) (pediatric): Secondary | ICD-10-CM | POA: Diagnosis not present

## 2013-09-30 DIAGNOSIS — I10 Essential (primary) hypertension: Secondary | ICD-10-CM | POA: Diagnosis not present

## 2013-09-30 MED ORDER — DONEPEZIL HCL 10 MG PO TABS: 10.0000 mg | ORAL_TABLET | Freq: Every day | ORAL | Status: DC

## 2013-09-30 NOTE — Progress Notes (Signed)
I have read the note, and I agree with the clinical assessment and plan.  Derek [name],Derek [name]   

## 2013-09-30 NOTE — Patient Instructions (Signed)
Continued Aricept at current dose, will refill Memory score is stable  Followup with me in 6 months

## 2013-09-30 NOTE — Progress Notes (Signed)
[facility]  PATIENT: Derek [name] DOB: Feb 21, 1927   REASON FOR VISIT: For memory loss and TIA   HISTORY OF PRESENT ILLNESS: Derek [name], 80 year old male returns for followup. He was last seen by Dr. Jannifer [name] 04/01/2013. 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  He also has sleep apnea but has not followed up with Dr. Brett [name]. He is with his daughter today and he denies further TIA episodes . He received a cardiac stent in November 2014. He is getting ready to begin cardiac rehabilitation. He has no new neurologic complaints. He returns for reevaluation. He and daughter report memory is stable.   HISTORY: of a progressive memory disturbance. The patient was last seen in October of 2013, and he was in general doing well at that time on Aricept. The patient is still operating a motor vehicle, but he has limited his driving to very short distances. The patient was admitted to the hospital in March of 2014 with an episode of syncope. The patient was felt to have had a brief episode of cardiac arrest, requiring initiation of CPR. The patient underwent a cardiology evaluation, and the family indicates that he also had a cerebrovascular evaluation. The patient was placed on a CardioNet monitor for 30 days, and atrial fibrillation was noted to occur intermittently. The patient has since been on Eliquis as an anticoagulant medication. The patient has not had any further syncopal episodes. His cardiologist, Dr. Pernell [name] has indicated that he should not be driving for 6 months. The patient returns for an evaluation. In June of 2014, the patient had fallen asleep outside on a hot day. The patient was noted to have a transient episode lasting about 10 minutes of garbled speech without associated facial asymmetry,  numbness, or weakness of the extremities. This fully resolved. EMS was called, but the patient never went to the hospital. The patient comes to this office for an evaluation.   REVIEW OF SYSTEMS: Full 14 system review of systems performed and notable only for those listed, all others are neg:  Constitutional: N/A  Cardiovascular: N/A  Ear/Nose/Throat: N/A  Skin: N/A  Eyes: N/A  Respiratory: N/A  Gastroitestinal: N/A  Gastrourinary: Urinary frequency Hematology/Lymphatic: N/A  Endocrine: N/A Musculoskeletal:N/A  Allergy/Immunology: N/A  Neurological: Memory loss Psychiatric: N/A Sleep  obstructive sleep apnea   ALLERGIES: Allergies  Allergen Reactions  . Augmentin [Amoxicillin-Pot Clavulanate]     rash  . Penicillins   . Sulfur Rash    HOME MEDICATIONS: Outpatient Prescriptions Prior to Visit  Medication Sig Dispense Refill  . amLODipine (NORVASC) 10 MG tablet Take 10 mg by mouth daily.      Marland Kitchen aspirin 81 MG chewable tablet Chew 1 tablet (81 mg total) by mouth daily.      Marland Kitchen atorvastatin (LIPITOR) 40 MG tablet Take 1 tablet (40 mg total) by mouth daily at 6 PM.  90 tablet  3  . cholecalciferol (VITAMIN D) 1000 UNITS tablet Take 1,000 Units by mouth daily.      . Coenzyme Q10 (COQ10) 100 MG CAPS Take 1 capsule by mouth daily.       Marland Kitchen donepezil (ARICEPT) 10 MG tablet Take 10 mg by mouth at bedtime.      Marland Kitchen losartan (COZAAR) 100 MG tablet Take 100 mg by mouth daily.      Marland Kitchen  LUTEIN PO Take by mouth.      . metoprolol tartrate (LOPRESSOR) 25 MG tablet Take 1 tablet (25 mg total) by mouth 2 (two) times daily.  180 tablet  3  . Multiple Vitamins-Minerals (CENTRUM SILVER PO) Take 1 tablet by mouth daily.       . nitroGLYCERIN (NITROSTAT) 0.4 MG SL tablet Place 1 tablet (0.4 mg total) under the tongue every 5 (five) minutes x 3 doses as needed for chest pain.  25 tablet  1  . omeprazole (PRILOSEC) 20 MG capsule Take 20 mg by mouth daily.      . clopidogrel (PLAVIX) 75 MG tablet Take 1  tablet (75 mg total) by mouth daily with breakfast.  90 tablet  3  . warfarin (COUMADIN) 2 MG tablet Take as directed by anticoagulation clinic  90 tablet  3   No facility-administered medications prior to visit.    PAST MEDICAL HISTORY: Past Medical History  Diagnosis Date  . CVA (cerebral vascular accident)   . Broken shoulder   . Cerebrovascular disease   . Sleep apnea   . Shingles     left chest  . Compression fracture   . Hypertension   . Pneumonia   . Atrial fibrillation   . Clavicle fracture   . Syncope   . History of pneumonia   . Complex sleep apnea syndrome     adapt SV titration EEP 6 min 6 and max 15 cm water.   Marland Kitchen PAF (paroxysmal atrial fibrillation) 07/12/2013  . Syncope and collapse 07/12/2013  . TIA (transient ischemic attack) 07/12/2013  . Long term (current) use of anticoagulants 07/12/2013    Eliquis    . Coronary atherosclerosis of native coronary artery 07/12/2013    Angina   . Acute myocardial infarction of other lateral wall, initial episode of care     PAST SURGICAL HISTORY: Past Surgical History  Procedure Laterality Date  . Tonsillectomy    . Cataract extraction Bilateral   . Kyphoplasty    . Shoulder surgery Left     FAMILY HISTORY: Family History  Problem Relation Age of Onset  . Diabetes Mother   . Diabetes Father   . Cancer - Lung Brother   . Gait disorder Brother   . Cancer Daughter     SOCIAL HISTORY: History   Social History  . Marital Status: Widowed    Spouse Name: N/A    Number of Children: 2  . Years of Education: Engineer, maintenance (IT)   Occupational History  . retired    Social History Main Topics  . Smoking status: Never Smoker   . Smokeless tobacco: Never Used  . Alcohol Use: 0.6 oz/week    1 Glasses of wine per week     Comment: occasionally  . Drug Use: No  . Sexual Activity: Not on file   Other Topics Concern  . Not on file   Social History Narrative   Patient is widowed Derek [name]), has 2 children   Patient is right  handed   Education level is Master's degree   Caffeine consumption is 2-4 cups daily     PHYSICAL EXAM  Filed Vitals:   09/30/13 0951  BP: 135/71  Pulse: 58  Height: 5' 8.5" (1.74 m)  Weight: 192 lb (87.091 kg)   Body mass index is 28.77 kg/(m^2).  Generalized: Well developed, in no acute distress   Neurological examination   Mentation: Alert , MMSE 28/30 missing  today's date and the day of the week .  AFT 9 Follows all commands speech and language fluent  Cranial nerve II-XII: Pupils were equal round reactive to light extraocular movements were full, visual field were full on confrontational test. Facial sensation and strength were normal. hearing was intact to finger rubbing bilaterally. Uvula tongue midline. head turning and shoulder shrug were normal and symmetric.Tongue protrusion into cheek strength was normal. Motor: normal bulk and tone, full strength in the BUE, BLE, fine finger movements normal, no pronator drift. No focal weakness Coordination: finger-nose-finger, heel-to-shin bilaterally, no dysmetria Reflexes: Brachioradialis 2/2, biceps 2/2, triceps 2/2, patellar 2/2, Achilles 2/2, plantar responses were flexor bilaterally. Gait and Station: Rising up from seated position without assistance, normal stance,  moderate stride, good arm swing, smooth turning, able to perform tiptoe, and heel walking without difficulty. Tandem gait is steady  DIAGNOSTIC DATA (LABS, IMAGING, TESTING) - I reviewed patient records, labs, notes, testing and imaging myself where available.  Lab Results  Component Value Date   WBC 7.0 08/09/2013   HGB 12.5* 08/09/2013   HCT 37.3* 08/09/2013   MCV 74.7* 08/09/2013   PLT 207 08/09/2013      Component Value Date/Time   NA 142 09/19/2013 1428   K 4.4 09/19/2013 1428   CL 110 09/19/2013 1428   CO2 26 09/19/2013 1428   GLUCOSE 106* 09/19/2013 1428   BUN 23 09/19/2013 1428   CREATININE 1.6* 09/19/2013 1428   CALCIUM 9.4 09/19/2013 1428   PROT 6.8  08/05/2013 2354   ALBUMIN 3.5 08/05/2013 2354   AST 41* 08/05/2013 2354   ALT 22 08/05/2013 2354   ALKPHOS 67 08/05/2013 2354   BILITOT 0.1* 08/05/2013 2354   GFRNONAA 47* 08/09/2013 0545   GFRAA 55* 08/09/2013 0545     Lab Results  Component Value Date   NATFTDDU20 254 02/11/2011   Lab Results  Component Value Date   TSH 1.535 08/05/2013      ASSESSMENT AND PLAN  80 y.o. year old male  has a past medical history of CVA (cerebral vascular accident); Cerebrovascular disease; Sleep apnea;  Hypertension;  Atrial fibrillation;  Syncope and collapse (07/12/2013); TIA (transient ischemic attack) (07/12/2013); Long term (current) use of anticoagulants (07/12/2013); Coronary atherosclerosis of native coronary artery (07/12/2013); and Acute myocardial infarction of other lateral wall, here to followup for his memory loss and TIA. MRI of the brain  04/09/2013 showed left pontine infarct that is chronic and also small vessel disease which is moderate and mild degree of generalized cerebral atrophy.  Continued Aricept at current dose, will refill Memory score is stable Needs fu sleep appt with Dr. Brett [name].  Followup with me in 6 months Dennie Bible, [facility], [facility], APRN  [facility] [address], [place], [place] [address] [phone]

## 2013-10-03 ENCOUNTER — Telehealth: Payer: Self-pay

## 2013-10-03 NOTE — Telephone Encounter (Signed)
Message copied by Lamar Laundry on Mon Oct 03, 2013  8:48 AM ------      Message from: Daneen Schick      Created: Sun Oct 02, 2013  2:46 PM       Labs are okay ------

## 2013-10-03 NOTE — Telephone Encounter (Signed)
lmom labs ok 

## 2013-10-17 ENCOUNTER — Telehealth: Payer: Self-pay | Admitting: Interventional Cardiology

## 2013-10-17 NOTE — Telephone Encounter (Signed)
Received request from Nurse fax box, documents faxed for surgical clearance. To: Collene [email or web address].Lanier,DDS Fax number: [phone] Attention: [id number]/kdm

## 2013-10-19 ENCOUNTER — Ambulatory Visit (INDEPENDENT_AMBULATORY_CARE_PROVIDER_SITE_OTHER): Payer: Medicare Other | Admitting: *Deleted

## 2013-10-19 DIAGNOSIS — I219 Acute myocardial infarction, unspecified: Secondary | ICD-10-CM | POA: Diagnosis not present

## 2013-10-19 DIAGNOSIS — Z7901 Long term (current) use of anticoagulants: Secondary | ICD-10-CM

## 2013-10-19 DIAGNOSIS — I4891 Unspecified atrial fibrillation: Secondary | ICD-10-CM | POA: Diagnosis not present

## 2013-10-19 LAB — CBC
HEMATOCRIT: 37.6 % — AB (ref 39.0–52.0)
Hemoglobin: 12 g/dL — ABNORMAL LOW (ref 13.0–17.0)
MCHC: 31.8 g/dL (ref 30.0–36.0)
MCV: 77.8 fl — ABNORMAL LOW (ref 78.0–100.0)
Platelets: 246 10*3/uL (ref 150.0–400.0)
RBC: 4.84 Mil/uL (ref 4.22–5.81)
RDW: 16.6 % — ABNORMAL HIGH (ref 11.5–14.6)
WBC: 7.2 10*3/uL (ref 4.5–10.5)

## 2013-10-19 LAB — BASIC METABOLIC PANEL
BUN: 26 mg/dL — ABNORMAL HIGH (ref 6–23)
CHLORIDE: 108 meq/L (ref 96–112)
CO2: 27 meq/L (ref 19–32)
Calcium: 9.3 mg/dL (ref 8.4–10.5)
Creatinine, Ser: 1.5 mg/dL (ref 0.4–1.5)
GFR: 46.62 mL/min — ABNORMAL LOW (ref >60.00)
Glucose, Bld: 118 mg/dL — ABNORMAL HIGH (ref 70–99)
Potassium: 4.6 mEq/L (ref 3.5–5.1)
SODIUM: 143 meq/L (ref 135–145)

## 2013-10-19 NOTE — Progress Notes (Signed)
Pt was started on Eliquis 2.5mg  bid  for  Atrial fib on 09/21/2013 .    Reviewed patients medication list.  Pt is not  currently on any combined P-gp and strong CYP3A4 inhibitors/inducers (ketoconazole, traconazole, ritonavir, carbamazepine, phenytoin, rifampin, St. John's wort).  Reviewed labs.  SCr 1.5 , Weight 88.63kg .  Dose is appropriate based on labs.   Hgb 12.0 and HCT 37.6  A full discussion of the nature of anticoagulants has been carried out.  A benefit/risk analysis has been presented to the patient, so that they understand the justification for choosing anticoagulation with Eliquis at this time.  The need for compliance is stressed.  Pt is aware to take the medication twice daily.  Side effects of potential bleeding are discussed, including unusual colored urine or stools, coughing up blood or coffee ground emesis, nose bleeds or serious fall or head trauma.  Discussed signs and symptoms of stroke. The patient should avoid any OTC items containing aspirin or ibuprofen.  Avoid alcohol consumption.   Call if any signs of abnormal bleeding.  Discussed financial obligations and states not having any problems in  obtaining medication.  Next lab test test in 6  months.  Pt states has not had any nose bleeds since on Eliquis and this nurse talked with daughter and she states that she is cutting the 5mg  of Eliquis in 1/2 and he is taking 2.5mg  2 times a day . Pt states he has missed a few doses Pt to have cbc and bmet today  Talked with pt's daughter and she states that he is to have dental procedure but is not scheduled instructed to have dentist to say how long needs to be off Eliquis and then needs cardiac clearance from Dr Tamala Julian  10/21/2013 Talked with pts daughter and instructed that pt is on correct dose based on age and 81 and appt made for recheck in 27months and she states understanding

## 2013-10-20 ENCOUNTER — Encounter (HOSPITAL_COMMUNITY)
Admission: RE | Admit: 2013-10-20 | Discharge: 2013-10-20 | Disposition: A | Discharge status: Home / Self Care | Payer: Medicare Other | Source: Ambulatory Visit | Attending: Interventional Cardiology | Admitting: Interventional Cardiology

## 2013-10-20 DIAGNOSIS — Z5189 Encounter for other specified aftercare: Secondary | ICD-10-CM | POA: Insufficient documentation

## 2013-10-20 DIAGNOSIS — I252 Old myocardial infarction: Secondary | ICD-10-CM | POA: Insufficient documentation

## 2013-10-20 DIAGNOSIS — I2510 Atherosclerotic heart disease of native coronary artery without angina pectoris: Secondary | ICD-10-CM | POA: Insufficient documentation

## 2013-10-20 DIAGNOSIS — Z9861 Coronary angioplasty status: Secondary | ICD-10-CM | POA: Insufficient documentation

## 2013-10-20 NOTE — Progress Notes (Signed)
Cardiac Rehab Medication Review by a Pharmacist  Does the patient  feel that his/her medications are working for him/her?  yes  Has the patient been experiencing any side effects to the medications prescribed?  no  Does the patient measure his/her own blood pressure or blood glucose at home?  yes   Does the patient have any problems obtaining medications due to transportation or finances?   no  Understanding of regimen: fair Understanding of indications: good Potential of compliance: good    Pharmacist comments: Patient's daughters help with medications.  One of his daughters is an Therapist, sports.  He seems to know the indications for most of his medications but admits his daughters manage most of the medications and help make sure he takes them.    Thank you, Vivia Ewing, PharmD Clinical Pharmacist - Resident Pager: [phone] Pharmacy: [phone] 10/20/2013 8:58 AM

## 2013-10-24 ENCOUNTER — Encounter (HOSPITAL_COMMUNITY)
Admission: RE | Admit: 2013-10-24 | Discharge: 2013-10-24 | Disposition: A | Discharge status: Home / Self Care | Payer: Medicare Other | Source: Ambulatory Visit | Attending: Interventional Cardiology | Admitting: Interventional Cardiology

## 2013-10-24 DIAGNOSIS — Z9861 Coronary angioplasty status: Secondary | ICD-10-CM | POA: Diagnosis not present

## 2013-10-24 DIAGNOSIS — Z5189 Encounter for other specified aftercare: Secondary | ICD-10-CM | POA: Diagnosis not present

## 2013-10-24 DIAGNOSIS — I252 Old myocardial infarction: Secondary | ICD-10-CM | POA: Diagnosis not present

## 2013-10-24 DIAGNOSIS — I2510 Atherosclerotic heart disease of native coronary artery without angina pectoris: Secondary | ICD-10-CM | POA: Diagnosis not present

## 2013-10-24 NOTE — Progress Notes (Signed)
Pt started cardiac rehab today.  Pt tolerated light exercise without difficulty. Telemetry Sinus rhythm. Vital signs stable. Will continue to monitor the patient throughout  the program.

## 2013-10-26 ENCOUNTER — Encounter (HOSPITAL_COMMUNITY)
Admission: RE | Admit: 2013-10-26 | Discharge: 2013-10-26 | Disposition: A | Discharge status: Home / Self Care | Payer: Medicare Other | Source: Ambulatory Visit | Attending: Interventional Cardiology | Admitting: Interventional Cardiology

## 2013-10-26 ENCOUNTER — Telehealth: Payer: Self-pay

## 2013-10-26 NOTE — Telephone Encounter (Signed)
lmom.Labs are stable 

## 2013-10-26 NOTE — Telephone Encounter (Signed)
Message copied by Lamar Laundry on Wed Oct 26, 2013 10:10 AM ------      Message from: Daneen Schick      Created: Thu Oct 20, 2013  5:55 PM       Labs are stable ------

## 2013-10-28 ENCOUNTER — Encounter (HOSPITAL_COMMUNITY)
Admission: RE | Admit: 2013-10-28 | Discharge: 2013-10-28 | Disposition: A | Discharge status: Home / Self Care | Payer: Medicare Other | Source: Ambulatory Visit | Attending: Interventional Cardiology | Admitting: Interventional Cardiology

## 2013-10-28 ENCOUNTER — Telehealth (HOSPITAL_COMMUNITY): Payer: Self-pay | Admitting: Cardiac Rehabilitation

## 2013-10-28 NOTE — Progress Notes (Signed)
Derek [name] has completed his first week of cardiac rehab without difficulty. [name] did have some difficulty finding the department after parking in the employee parking garage, An employee brought Lawrence to Cardiac rehab. I called the patients daughter to ask her to consider driving [name] to exercise sessions. Derek [name] is alert and oriented and pleasant but admits to having some problems with memory at times. Will continue to monitor the patient throughout  the program.

## 2013-10-31 ENCOUNTER — Encounter (HOSPITAL_COMMUNITY)
Admission: RE | Admit: 2013-10-31 | Discharge: 2013-10-31 | Disposition: A | Discharge status: Home / Self Care | Payer: Medicare Other | Source: Ambulatory Visit | Attending: Interventional Cardiology | Admitting: Interventional Cardiology

## 2013-10-31 NOTE — Progress Notes (Signed)
Reviewed home exercise with pt today.  Pt plans to walk and use "exercycle" at home for exercise.  Reviewed THR, pulse, RPE, sign and symptoms, NTG use, and when to call 911 or MD.  Pt's NTG is at home, discussed the importance of keeping this on him at all times.  Pt voiced understanding. Alberteen Sam, MA, ACSM RCEP

## 2013-11-02 ENCOUNTER — Encounter (HOSPITAL_COMMUNITY)
Admission: RE | Admit: 2013-11-02 | Discharge: 2013-11-02 | Disposition: A | Discharge status: Home / Self Care | Payer: Medicare Other | Source: Ambulatory Visit | Attending: Interventional Cardiology | Admitting: Interventional Cardiology

## 2013-11-04 ENCOUNTER — Telehealth: Payer: Self-pay | Admitting: Cardiology

## 2013-11-04 ENCOUNTER — Encounter: Payer: Self-pay | Admitting: Neurology

## 2013-11-04 ENCOUNTER — Telehealth: Payer: Self-pay

## 2013-11-04 ENCOUNTER — Ambulatory Visit (HOSPITAL_COMMUNITY)
Admission: RE | Admit: 2013-11-04 | Discharge: 2013-11-04 | Disposition: A | Discharge status: Home / Self Care | Payer: Medicare Other | Source: Ambulatory Visit | Attending: Internal Medicine | Admitting: Internal Medicine

## 2013-11-04 ENCOUNTER — Ambulatory Visit (INDEPENDENT_AMBULATORY_CARE_PROVIDER_SITE_OTHER): Payer: Medicare Other | Admitting: Neurology

## 2013-11-04 ENCOUNTER — Encounter (HOSPITAL_COMMUNITY)
Admission: RE | Admit: 2013-11-04 | Discharge: 2013-11-04 | Disposition: A | Discharge status: Home / Self Care | Payer: Medicare Other | Source: Ambulatory Visit | Attending: Interventional Cardiology | Admitting: Interventional Cardiology

## 2013-11-04 VITALS — BP 110/70 | HR 75 | Resp 17 | Ht 69.0 in | Wt 191.0 lb

## 2013-11-04 DIAGNOSIS — I4891 Unspecified atrial fibrillation: Secondary | ICD-10-CM | POA: Diagnosis not present

## 2013-11-04 DIAGNOSIS — G4730 Sleep apnea, unspecified: Secondary | ICD-10-CM | POA: Insufficient documentation

## 2013-11-04 DIAGNOSIS — G4733 Obstructive sleep apnea (adult) (pediatric): Secondary | ICD-10-CM | POA: Diagnosis not present

## 2013-11-04 NOTE — Telephone Encounter (Signed)
Cardiac rehab called that Mr. Derek [name] was in atrial fib HR 103. No chest pain or SOB.  I reviewed his Hx. And EKG done today.  He is on Eliquis.  I talked to his daughter and Dr. Tamala Julian and we have increased his lopressor to 37.5 mg twice a day.  We will see him back in the office the first of the week- we could not see him today.  I talked to his daughter [name] and instructed on increasing dose of lopressor.  He will come to ER if he develops chest pain or shortness of breath.  They will call if they have any questions.

## 2013-11-04 NOTE — Progress Notes (Signed)
Patient appears to be in atrial fibrillation this morning.  Blood pressure 118/70.  Heart rate 104- 127.  Patient asymptomatic. Oxygen saturation 95% on room air.  Cecilie Kicks FNP-C called and notified. 12 lead ECG ordered. Cecilie Kicks FNP-C said patient needs to be seen in [facility] office today. 12 lead confirms Atrial fibrillation rate 103. Patient's daughter called and notified of events.

## 2013-11-04 NOTE — Patient Instructions (Signed)

## 2013-11-04 NOTE — Progress Notes (Signed)
[facility]  Provider:  Dr Hafsa Lohn Referring Provider: Kandice Hams, MD Primary Care Physician:  Kandice Hams, MD   Dr Jannifer Franklin patient with complex apnea. :  HPI:  Derek [name] is a 80 y.o. male here as a referral from Dr. Delfina Redwood for Derek [name] is a meanwhile 80 year old right-handed Caucasian male who presents today in the presence of his daughter.  He was last seen by Cecille Rubin, NP for Dr. Jannifer Franklin on  09-30-13 , for evaluation of  memory problems. Dr. Jannifer Franklin mentioned cerebrovascular disease , atrial fibrilllation , he has d a heart attack the day after Thanksgiving.  He has been on Eloquist.   He had endorsed  the Epworth Sleepiness Scale here at 17 points, FSS at 40 points.    The patient had undergone a titration on 03-30-13. He was adapt SV at 5 cm water and expiratory pressure with a minimum pressure of 6 cm and a maximum pressure of 15 cm water with pressure support [name] allows the patient to believe that he needs and supplies variable pressure is S. requested or required by the patient. He also was fitted with a nasal mouth or chin strap to prevent all air old floor at the time. The patient understood the compliance requirements very well also that it is probably not possible to eliminate all apnea by using ASB on the background of his complex and mixed apnea type.  A download was reviewed here today,  which is dated 06/09/2013 and had been provided by [facility] it shows a residual AHI of 4.8 and the patient's maximum pressure support was able to be reduced to 10 cm water after review with. The patient had 83% compliance.   He report his machine is gargling and the pressure feels reduced.  He will need the machine to be checked out at [facility], he has noted that the machine's water reservoir seems not to decrease either. He  assured me that he had not manipulated any settings. This is a development over the last 2-3 weeks.  He still had high air leaks.  He used the machine nightly for 5 hours and 57 minutes.   History of sleep apnea evaluation , complex sleep apnea.: The patient underwent an adapt  SV titration on 08/15/2005 this after a CPAP and BiPAP titration had failed ( 07-15-05). The patient had continued oxygen desaturations and central apneas , while  the obstructive apneas were resolved at baseline I was concerned  his apnea type was more  complex and not straightforward obstructive apnea.  The patient reached an AHI of 0.9 during the adductus the titration he was bradycardic several times at night as it was tablet of 34. This EEP was 6 cm,  and is pressure support 5 cm minimum and 10 cm at maximum. At the time he was followed by Dr. Horatio Pel .  His AHI was 52.3 and the preceding baseline study 27 of these apneas were obstructive and 270  central apneas . 02  nadir was 71% I was able today to review a download from the CPAP machine dated 02/11/2013 and forwarded to me from [facility] and the [facility].  The machine's  memory function may not be as good anymore:  his median daily usage is indicated at 3 hours and 58 minutes, and  the mask leaks. There is a high peak inspiratory pressure of 18 cm water which I doubt a gentleman of his build would need. The download does  not contain A H. I data.   During last he is visit with his primary I male he addressed, the patient was already placed on Aricept through his primary care physician at the time Dr. Sunday Corn. His Mini-Mental status short 28/30 points the patient was able to name 13 animals in 30 seconds he had an entirely nonfocal neurologic exam. He had multiple strokes he reported, but no lasting impairment physically. Last TIA was march 28 th 2014.  He was worked up  at [employer] and diagnosed  with atrial  fib.    The patient tries to go to bed at 9:00 he often falls asleep watching TV much earlier on his living room couch. Takes  multiple , unplanned  naps during the day, he rises  between 5 and 6 AM, he estimates his hours of sleep at night about 6 hours, and he has multiple bathroom breaks, 4-5 times.   The patient denies any skull or facial bone surgeries nasal septum correction is no retrognathia and nor dental device or retainer for the treatment of snoring or apnea.  Snoring is loud, even with the machine.  There is no family history of an inherited sleep disorder. His daughter snores, his 55 year old son is alcoholic.    Review of Systems: Out of a complete 14 system review, the patient complains of only the following symptoms, and all other reviewed systems are negative. Epworth 12 points, FSS 28, GDS 2 .     History   Social History  . Marital Status: Widowed    Spouse Name: N/A    Number of Children: 2  . Years of Education: Engineer, maintenance (IT)   Occupational History  . retired    Social History Main Topics  . Smoking status: Never Smoker   . Smokeless tobacco: Never Used  . Alcohol Use: 0.6 oz/week    1 Glasses of wine per week     Comment: occasionally  . Drug Use: No  . Sexual Activity: Not on file   Other Topics Concern  . Not on file   Social History Narrative   Patient is widowed Derek [name]), has 2 children   Patient is right handed   Education level is 4 year degree.   Caffeine consumption is 2-4 cups daily    Family History  Problem Relation Age of Onset  . Diabetes Mother   . Diabetes Father   . Cancer - Lung Brother   . Gait disorder Brother   . Cancer Daughter     Past Medical History  Diagnosis Date  . CVA (cerebral vascular accident)   . Broken shoulder   . Cerebrovascular disease   . Sleep apnea   . Shingles     left chest  . Compression fracture   . Hypertension   . Pneumonia   . Atrial fibrillation   . Clavicle fracture   . Syncope   . History of pneumonia   . Complex sleep apnea syndrome     adapt SV titration EEP 6 min 6 and max 15 cm water.   Marland Kitchen PAF (paroxysmal atrial fibrillation) 07/12/2013  . Syncope and  collapse 07/12/2013  . TIA (transient ischemic attack) 07/12/2013  . Long term (current) use of anticoagulants 07/12/2013    Eliquis    . Coronary atherosclerosis of native coronary artery 07/12/2013    Angina   . Acute myocardial infarction of other lateral wall, initial episode of care   . Sleep apnea with use of continuous positive airway  pressure (CPAP) 11/04/2013    Past Surgical History  Procedure Laterality Date  . Tonsillectomy    . Cataract extraction Bilateral   . Kyphoplasty    . Shoulder surgery Left     Current Outpatient Prescriptions  Medication Sig Dispense Refill  . amLODipine (NORVASC) 10 MG tablet Take 10 mg by mouth daily.      Marland Kitchen apixaban (ELIQUIS) 5 MG TABS tablet Pt is to take 2.5mg  bid Daughter is aware to cut 5mg  tablet in half- twice a day      . aspirin 81 MG chewable tablet Chew 1 tablet (81 mg total) by mouth daily.      Marland Kitchen atorvastatin (LIPITOR) 40 MG tablet Take 1 tablet (40 mg total) by mouth daily at 6 PM.  90 tablet  3  . cholecalciferol (VITAMIN D) 1000 UNITS tablet Take 1,000 Units by mouth daily.      . Coenzyme Q10 (COQ10) 100 MG CAPS Take 1 capsule by mouth daily.       Marland Kitchen donepezil (ARICEPT) 10 MG tablet Take 1 tablet (10 mg total) by mouth at bedtime.  30 tablet  6  . losartan (COZAAR) 100 MG tablet Take 100 mg by mouth daily.      . LUTEIN PO Take by mouth.      . metoprolol tartrate (LOPRESSOR) 25 MG tablet Take by mouth 2 (two) times daily. Taking 1 1/2 of a 25 mg tablet , twice a day.      . Misc Natural Products (PROSTATE) CAPS Take 1 capsule by mouth 2 (two) times daily.      . Multiple Vitamins-Minerals (CENTRUM SILVER PO) Take 1 tablet by mouth daily.       . nitroGLYCERIN (NITROSTAT) 0.4 MG SL tablet Place 1 tablet (0.4 mg total) under the tongue every 5 (five) minutes x 3 doses as needed for chest pain.  25 tablet  1  . omeprazole (PRILOSEC) 20 MG capsule Take 20 mg by mouth daily.       No current facility-administered medications for  this visit.    Allergies as of 11/04/2013 - Review Complete 11/04/2013  Allergen Reaction Noted  . Augmentin [amoxicillin-pot clavulanate]  03/03/2013  . Penicillins  03/14/2012  . Sulfur Rash 03/14/2012    Vitals: BP 110/70  Pulse 75  Resp 17  Ht 5\' 9"  (1.753 m)  Wt 191 lb (86.637 kg)  BMI 28.19 kg/m2 Last Weight:  Wt Readings from Last 1 Encounters:  11/04/13 191 lb (86.637 kg)   Last Height:   Ht Readings from Last 1 Encounters:  11/04/13 5\' 9"  (1.753 m)     Physical exam:  General: The patient is awake, alert and appears not in acute distress. The patient is well groomed. Head: Normocephalic, atraumatic. Neck is supple. Mallampati  3 ,  neck circumference: 15 inches , no septal deviation, no retrognathia.  Cardiovascular:  Regular rate and rhythm , without  murmurs or carotid bruit, and without distended neck veins. Respiratory: Lungs are clear to auscultation. Skin:  Without evidence of edema, or rash Trunk:  normal posture.  Neurologic exam : The patient is awake and alert, oriented to place and time.  Memory subjective  described as impaired , but there is a normal attention span & concentration ability.  Speech is fluent without dysarthria, but  dysphonia . Mood and affect are depressed.   Cranial nerves: Pupils are equal and briskly reactive to light. Funduscopic exam withevidence of pallor , not  edema. Extraocular movements  in vertical and horizontal planes intact and without nystagmus. Visual fields by finger perimetry are intact. Hearing to finger rub intact.  Facial sensation intact to fine touch. Facial motor strength is symmetric and tongue and uvula move midline.  Motor exam:   Normal tone and normal muscle bulk and symmetric normal strength in all extremities.  Sensory:  Fine touch, pinprick and vibration were tested in all extremities. Proprioception  normal.  Coordination: Rapid alternating movements in the fingers/hands is tested and normal.  Finger-to-nose maneuver tested and normal without evidence of ataxia, dysmetria or tremor.  Gait and station: Patient walks without assistive device and is able and assisted stool climb up to the exam table. Strength within normal limits. Stance is stable , testing is normal.  Deep tendon reflexes: in the  upper and lower extremities are symmetric and intact. Babinski downgoing.   Assessment:  After physical and neurologic examination, review of laboratory studies, sleep studies, neuro testing and pre-existing records,reviewed on the problem list.  Derek [name] does  Finally realize a benefit from PAP therapy , except for the last 14 days when he noticed sounds, perhaps related to  water condensation problems .  Epworth is much reduced in comparison to prior evaluation. I reduceed the heat temperature for humidifier and evaluated by manometer the current settings- starting at 5 and rising to 8 cm water. No sounds.  Asked him to give Jonni Sanger at [facility] a call on [facility] day if the machine still has condensation over the weekends.  May need heated coil hose. Download in 3 month - [facility] sleep clinic in 6 month.     He has meanwhile suffered" multiple mini strokes "", was diagnosed with atrial fibrillation and had episodic congestive heart failure. He also has hypertension: These are all risk factors of untreated or insufficiently treated sleep apnea.  His complex apnea could have worsened by ongoing cerebral vascular disease or any degenerative CNS disorder.    Plan:

## 2013-11-04 NOTE — Telephone Encounter (Signed)
called pt.pt appt made for 11/09/13 @ 4pm...pt verbalized understanding.

## 2013-11-04 NOTE — Progress Notes (Addendum)
Cecilie Kicks FNP-C to increase Derek [name]'s betablocker. Derek [name] did not exercise today and is okay to go home. Patient daughter [name] will come to pick up the patient. Patient is not to return to exercise until he is seen at [facility] office next week there was no appointment available for today. Exit blood pressure 142/79 via automatic BP Cuff. Heart rate 94. Patient given atrial fibrillation education information. Will fax exercise flow sheets to Dr. Thompson Caul  office for review with today's ECG tracings.

## 2013-11-07 ENCOUNTER — Encounter (HOSPITAL_COMMUNITY): Payer: Medicare Other

## 2013-11-09 ENCOUNTER — Encounter: Payer: Self-pay | Admitting: Neurology

## 2013-11-09 ENCOUNTER — Ambulatory Visit (INDEPENDENT_AMBULATORY_CARE_PROVIDER_SITE_OTHER): Payer: Medicare Other | Admitting: Interventional Cardiology

## 2013-11-09 ENCOUNTER — Encounter: Payer: Self-pay | Admitting: Interventional Cardiology

## 2013-11-09 ENCOUNTER — Encounter (HOSPITAL_COMMUNITY): Payer: Medicare Other

## 2013-11-09 VITALS — BP 150/66 | HR 60 | Ht 67.0 in | Wt 191.0 lb

## 2013-11-09 DIAGNOSIS — I4891 Unspecified atrial fibrillation: Secondary | ICD-10-CM

## 2013-11-09 DIAGNOSIS — I2510 Atherosclerotic heart disease of native coronary artery without angina pectoris: Secondary | ICD-10-CM | POA: Diagnosis not present

## 2013-11-09 DIAGNOSIS — Z7901 Long term (current) use of anticoagulants: Secondary | ICD-10-CM

## 2013-11-09 DIAGNOSIS — G459 Transient cerebral ischemic attack, unspecified: Secondary | ICD-10-CM

## 2013-11-09 NOTE — Progress Notes (Signed)
Patient ID: HAVEN FOSS, male   DOB: 03/25/1927, 80 y.o.   MRN: [id number]    [address], [address], [place]  [address] Phone: [phone] Fax:  [phone]  Date:  11/09/2013   ID:  ANTAWN SISON, DOB [birth year], MRN [id number]  PCP:  Kandice Hams, MD   ASSESSMENT:  1. Paroxysmal atrial fibrillation, noted to be out of rhythm most recently in cardiac rehabilitation on February 27. Metoprolol dose was increased to 37.5 mg twice daily 2. Coronary artery disease, without angina 3. Long-time use of anticoagulation therapy, Eliquis 4. Hypertension, controlled  PLAN:  1. Continue current dose of metoprolol 2. Return to cardiac rehabilitation and full activity 3. Clinical followup in 6 months   SUBJECTIVE: REYMOND MAYNEZ is a 80 y.o. male in atrial fibrillation doing cardiac rehabilitation. His metoprolol dose increased by 50%. Memory is significantly impaired. He denies angina. No side effects on increased metoprolol dose. He has not had syncope.   Wt Readings from Last 3 Encounters:  11/09/13 191 lb (86.637 kg)  11/04/13 191 lb (86.637 kg)  10/20/13 195 lb 15.8 oz (88.9 kg)     Past Medical History  Diagnosis Date  . CVA (cerebral vascular accident)   . Broken shoulder   . Cerebrovascular disease   . Sleep apnea   . Shingles     left chest  . Compression fracture   . Hypertension   . Pneumonia   . Atrial fibrillation   . Clavicle fracture   . Syncope   . History of pneumonia   . Complex sleep apnea syndrome     adapt SV titration EEP 6 min 6 and max 15 cm water.   Marland Kitchen PAF (paroxysmal atrial fibrillation) 07/12/2013  . Syncope and collapse 07/12/2013  . TIA (transient ischemic attack) 07/12/2013  . Long term (current) use of anticoagulants 07/12/2013    Eliquis    . Coronary atherosclerosis of native coronary artery 07/12/2013    Angina   . Acute myocardial infarction of other lateral wall, initial episode of care   . Sleep apnea with use of  continuous positive airway pressure (CPAP) 11/04/2013    Current Outpatient Prescriptions  Medication Sig Dispense Refill  . amLODipine (NORVASC) 10 MG tablet Take 10 mg by mouth daily.      Marland Kitchen apixaban (ELIQUIS) 5 MG TABS tablet Pt is to take 2.5mg  bid Daughter is aware to cut 5mg  tablet in half- twice a day      . aspirin 81 MG chewable tablet Chew 1 tablet (81 mg total) by mouth daily.      Marland Kitchen atorvastatin (LIPITOR) 40 MG tablet Take 1 tablet (40 mg total) by mouth daily at 6 PM.  90 tablet  3  . cholecalciferol (VITAMIN D) 1000 UNITS tablet Take 1,000 Units by mouth daily.      . Coenzyme Q10 (COQ10) 100 MG CAPS Take 1 capsule by mouth daily.       Marland Kitchen donepezil (ARICEPT) 10 MG tablet Take 1 tablet (10 mg total) by mouth at bedtime.  30 tablet  6  . losartan (COZAAR) 100 MG tablet Take 100 mg by mouth daily.      . LUTEIN PO Take by mouth.      . metoprolol tartrate (LOPRESSOR) 25 MG tablet Take by mouth 2 (two) times daily. Taking 1 1/2 of a 25 mg tablet , twice a day.      . Misc Natural Products (PROSTATE) CAPS Take 1  capsule by mouth 2 (two) times daily.      . Multiple Vitamins-Minerals (CENTRUM SILVER PO) Take 1 tablet by mouth daily.       . nitroGLYCERIN (NITROSTAT) 0.4 MG SL tablet Place 1 tablet (0.4 mg total) under the tongue every 5 (five) minutes x 3 doses as needed for chest pain.  25 tablet  1  . omeprazole (PRILOSEC) 20 MG capsule Take 20 mg by mouth daily.       No current facility-administered medications for this visit.    Allergies:    Allergies  Allergen Reactions  . Augmentin [Amoxicillin-Pot Clavulanate]     rash  . Penicillins   . Sulfur Rash    Social History:  The patient  reports that he has never smoked. He has never used smokeless tobacco. He reports that he drinks about 0.6 ounces of alcohol per week. He reports that he does not use illicit drugs.   ROS:  Please see the history of present illness.   Denies syncope. No edema.   All other systems reviewed  and negative.   OBJECTIVE: VS:  BP 150/66  Pulse 60  Ht 5\' 7"  (1.702 m)  Wt 191 lb (86.637 kg)  BMI 29.91 kg/m2 Well nourished, well developed, in no acute distress, elderly HEENT: normal Neck: JVD flat. Carotid bruit absent  Cardiac:  normal S1, S2; RRR; no murmur Lungs:  clear to auscultation bilaterally, no wheezing, rhonchi or rales Abd: soft, nontender, no hepatomegaly Ext: Edema absent. Pulses 2+ bilateral Skin: warm and dry Neuro:  CNs 2-12 intact, no focal abnormalities noted  EKG:  Sinus bradycardia at 56 beats per minute       Signed, Illene Labrador III, MD 11/09/2013 4:42 PM

## 2013-11-09 NOTE — Patient Instructions (Signed)
Your physician recommends that you continue on your current medications as directed. Please refer to the Current Medication list given to you today.  Your physician wants you to follow-up in: 6 months. You will receive a reminder letter in the mail two months in advance. If you don't receive a letter, please call our office to schedule the follow-up appointment.  

## 2013-11-10 ENCOUNTER — Ambulatory Visit: Payer: Medicare Other | Admitting: Interventional Cardiology

## 2013-11-11 ENCOUNTER — Encounter (HOSPITAL_COMMUNITY)
Admission: RE | Admit: 2013-11-11 | Discharge: 2013-11-11 | Disposition: A | Discharge status: Home / Self Care | Payer: Medicare Other | Source: Ambulatory Visit | Attending: Interventional Cardiology | Admitting: Interventional Cardiology

## 2013-11-11 DIAGNOSIS — I2510 Atherosclerotic heart disease of native coronary artery without angina pectoris: Secondary | ICD-10-CM | POA: Diagnosis not present

## 2013-11-11 DIAGNOSIS — Z9861 Coronary angioplasty status: Secondary | ICD-10-CM | POA: Insufficient documentation

## 2013-11-11 DIAGNOSIS — Z5189 Encounter for other specified aftercare: Secondary | ICD-10-CM | POA: Insufficient documentation

## 2013-11-11 DIAGNOSIS — I252 Old myocardial infarction: Secondary | ICD-10-CM | POA: Insufficient documentation

## 2013-11-11 NOTE — Progress Notes (Signed)
[name] returned to exercise today at cardiac rehab. Telemetry rhythm Sinus brady resting 57. Vital signs stable. Will continue to monitor the patient throughout  the program.

## 2013-11-14 ENCOUNTER — Encounter (HOSPITAL_COMMUNITY)
Admission: RE | Admit: 2013-11-14 | Discharge: 2013-11-14 | Disposition: A | Discharge status: Home / Self Care | Payer: Medicare Other | Source: Ambulatory Visit | Attending: Interventional Cardiology | Admitting: Interventional Cardiology

## 2013-11-14 DIAGNOSIS — Z9861 Coronary angioplasty status: Secondary | ICD-10-CM | POA: Diagnosis not present

## 2013-11-14 DIAGNOSIS — I252 Old myocardial infarction: Secondary | ICD-10-CM | POA: Diagnosis not present

## 2013-11-14 DIAGNOSIS — I2510 Atherosclerotic heart disease of native coronary artery without angina pectoris: Secondary | ICD-10-CM | POA: Diagnosis not present

## 2013-11-14 DIAGNOSIS — Z5189 Encounter for other specified aftercare: Secondary | ICD-10-CM | POA: Diagnosis not present

## 2013-11-16 ENCOUNTER — Other Ambulatory Visit: Payer: Self-pay

## 2013-11-16 ENCOUNTER — Encounter (HOSPITAL_COMMUNITY)
Admission: RE | Admit: 2013-11-16 | Discharge: 2013-11-16 | Disposition: A | Discharge status: Home / Self Care | Payer: Medicare Other | Source: Ambulatory Visit | Attending: Interventional Cardiology | Admitting: Interventional Cardiology

## 2013-11-16 DIAGNOSIS — I252 Old myocardial infarction: Secondary | ICD-10-CM | POA: Diagnosis not present

## 2013-11-16 DIAGNOSIS — Z5189 Encounter for other specified aftercare: Secondary | ICD-10-CM | POA: Diagnosis not present

## 2013-11-16 DIAGNOSIS — Z9861 Coronary angioplasty status: Secondary | ICD-10-CM | POA: Diagnosis not present

## 2013-11-16 DIAGNOSIS — I2510 Atherosclerotic heart disease of native coronary artery without angina pectoris: Secondary | ICD-10-CM | POA: Diagnosis not present

## 2013-11-16 MED ORDER — METOPROLOL TARTRATE 25 MG PO TABS: ORAL_TABLET | ORAL | Status: DC

## 2013-11-16 NOTE — Progress Notes (Signed)
Sinus brady 49 noted today during cool down today at cardiac rehab nonsustained. Vital signs stable. Will fax exercise flow sheets to Dr. Thompson Caul  office for review with today's ECG tracing.

## 2013-11-18 ENCOUNTER — Encounter (HOSPITAL_COMMUNITY): Payer: Medicare Other

## 2013-11-21 ENCOUNTER — Encounter (HOSPITAL_COMMUNITY): Payer: Medicare Other

## 2013-11-23 ENCOUNTER — Encounter (HOSPITAL_COMMUNITY)
Admission: RE | Admit: 2013-11-23 | Discharge: 2013-11-23 | Disposition: A | Discharge status: Home / Self Care | Payer: Medicare Other | Source: Ambulatory Visit | Attending: Interventional Cardiology | Admitting: Interventional Cardiology

## 2013-11-23 DIAGNOSIS — Z9861 Coronary angioplasty status: Secondary | ICD-10-CM | POA: Diagnosis not present

## 2013-11-23 DIAGNOSIS — Z5189 Encounter for other specified aftercare: Secondary | ICD-10-CM | POA: Diagnosis not present

## 2013-11-23 DIAGNOSIS — I252 Old myocardial infarction: Secondary | ICD-10-CM | POA: Diagnosis not present

## 2013-11-23 DIAGNOSIS — I2510 Atherosclerotic heart disease of native coronary artery without angina pectoris: Secondary | ICD-10-CM | POA: Diagnosis not present

## 2013-11-25 ENCOUNTER — Encounter (HOSPITAL_COMMUNITY)
Admission: RE | Admit: 2013-11-25 | Discharge: 2013-11-25 | Disposition: A | Discharge status: Home / Self Care | Payer: Medicare Other | Source: Ambulatory Visit | Attending: Interventional Cardiology | Admitting: Interventional Cardiology

## 2013-11-25 DIAGNOSIS — Z9861 Coronary angioplasty status: Secondary | ICD-10-CM | POA: Diagnosis not present

## 2013-11-25 DIAGNOSIS — I252 Old myocardial infarction: Secondary | ICD-10-CM | POA: Diagnosis not present

## 2013-11-25 DIAGNOSIS — I2510 Atherosclerotic heart disease of native coronary artery without angina pectoris: Secondary | ICD-10-CM | POA: Diagnosis not present

## 2013-11-25 DIAGNOSIS — Z5189 Encounter for other specified aftercare: Secondary | ICD-10-CM | POA: Diagnosis not present

## 2013-11-28 ENCOUNTER — Encounter (HOSPITAL_COMMUNITY)
Admission: RE | Admit: 2013-11-28 | Discharge: 2013-11-28 | Disposition: A | Discharge status: Home / Self Care | Payer: Medicare Other | Source: Ambulatory Visit | Attending: Interventional Cardiology | Admitting: Interventional Cardiology

## 2013-11-28 DIAGNOSIS — I252 Old myocardial infarction: Secondary | ICD-10-CM | POA: Diagnosis not present

## 2013-11-28 DIAGNOSIS — Z9861 Coronary angioplasty status: Secondary | ICD-10-CM | POA: Diagnosis not present

## 2013-11-28 DIAGNOSIS — Z5189 Encounter for other specified aftercare: Secondary | ICD-10-CM | POA: Diagnosis not present

## 2013-11-28 DIAGNOSIS — I2510 Atherosclerotic heart disease of native coronary artery without angina pectoris: Secondary | ICD-10-CM | POA: Diagnosis not present

## 2013-11-28 NOTE — Progress Notes (Signed)
Derek [name] 80 y.o. male Nutrition Note Spoke with pt.  Nutrition Survey reviewed with pt. Pt is not following the Therapeutic Lifestyle Changes diet. Age-appropriate nutrition discussed. Pt expressed understanding of the information reviewed. Pt aware of nutrition education classes offered.  Nutrition Diagnosis   Food-and nutrition-related knowledge deficit related to lack of exposure to information as related to diagnosis of: ? CVD   Nutrition Intervention   Benefits of adopting Therapeutic Lifestyle Changes discussed when Medficts reviewed.   Pt to attend the Portion Distortion class   Pt to attend the  ? Nutrition I class                     ? Nutrition II class   Continue client-centered nutrition education by RD, as part of interdisciplinary care.  Goal(s)   Pt to describe the benefit of including fruits, vegetables, whole grains, and low-fat dairy products in a heart healthy meal plan.  Monitor and Evaluate progress toward nutrition goal with team. Nutrition Risk:  Low   Derek Mound, M.Ed, RD, LDN, CDE 11/28/2013 11:16 AM

## 2013-11-30 ENCOUNTER — Encounter (HOSPITAL_COMMUNITY)
Admission: RE | Admit: 2013-11-30 | Discharge: 2013-11-30 | Disposition: A | Discharge status: Home / Self Care | Payer: Medicare Other | Source: Ambulatory Visit | Attending: Interventional Cardiology | Admitting: Interventional Cardiology

## 2013-11-30 DIAGNOSIS — I2510 Atherosclerotic heart disease of native coronary artery without angina pectoris: Secondary | ICD-10-CM | POA: Diagnosis not present

## 2013-11-30 DIAGNOSIS — Z9861 Coronary angioplasty status: Secondary | ICD-10-CM | POA: Diagnosis not present

## 2013-11-30 DIAGNOSIS — Z5189 Encounter for other specified aftercare: Secondary | ICD-10-CM | POA: Diagnosis not present

## 2013-11-30 DIAGNOSIS — I252 Old myocardial infarction: Secondary | ICD-10-CM | POA: Diagnosis not present

## 2013-12-02 ENCOUNTER — Encounter (HOSPITAL_COMMUNITY)
Admission: RE | Admit: 2013-12-02 | Discharge: 2013-12-02 | Disposition: A | Discharge status: Home / Self Care | Payer: Medicare Other | Source: Ambulatory Visit | Attending: Interventional Cardiology | Admitting: Interventional Cardiology

## 2013-12-02 DIAGNOSIS — I2510 Atherosclerotic heart disease of native coronary artery without angina pectoris: Secondary | ICD-10-CM | POA: Diagnosis not present

## 2013-12-02 DIAGNOSIS — Z9861 Coronary angioplasty status: Secondary | ICD-10-CM | POA: Diagnosis not present

## 2013-12-02 DIAGNOSIS — Z5189 Encounter for other specified aftercare: Secondary | ICD-10-CM | POA: Diagnosis not present

## 2013-12-02 DIAGNOSIS — I252 Old myocardial infarction: Secondary | ICD-10-CM | POA: Diagnosis not present

## 2013-12-05 ENCOUNTER — Encounter (HOSPITAL_COMMUNITY)
Admission: RE | Admit: 2013-12-05 | Discharge: 2013-12-05 | Disposition: A | Discharge status: Home / Self Care | Payer: Medicare Other | Source: Ambulatory Visit | Attending: Interventional Cardiology | Admitting: Interventional Cardiology

## 2013-12-05 DIAGNOSIS — Z9861 Coronary angioplasty status: Secondary | ICD-10-CM | POA: Diagnosis not present

## 2013-12-05 DIAGNOSIS — I252 Old myocardial infarction: Secondary | ICD-10-CM | POA: Diagnosis not present

## 2013-12-05 DIAGNOSIS — I2510 Atherosclerotic heart disease of native coronary artery without angina pectoris: Secondary | ICD-10-CM | POA: Diagnosis not present

## 2013-12-05 DIAGNOSIS — Z5189 Encounter for other specified aftercare: Secondary | ICD-10-CM | POA: Diagnosis not present

## 2013-12-07 ENCOUNTER — Encounter (HOSPITAL_COMMUNITY)
Admission: RE | Admit: 2013-12-07 | Discharge: 2013-12-07 | Disposition: A | Discharge status: Home / Self Care | Payer: Medicare Other | Source: Ambulatory Visit | Attending: Interventional Cardiology | Admitting: Interventional Cardiology

## 2013-12-07 DIAGNOSIS — Z9861 Coronary angioplasty status: Secondary | ICD-10-CM | POA: Insufficient documentation

## 2013-12-07 DIAGNOSIS — Z5189 Encounter for other specified aftercare: Secondary | ICD-10-CM | POA: Insufficient documentation

## 2013-12-07 DIAGNOSIS — I2510 Atherosclerotic heart disease of native coronary artery without angina pectoris: Secondary | ICD-10-CM | POA: Insufficient documentation

## 2013-12-07 DIAGNOSIS — I252 Old myocardial infarction: Secondary | ICD-10-CM | POA: Diagnosis not present

## 2013-12-09 ENCOUNTER — Encounter (HOSPITAL_COMMUNITY): Payer: Medicare Other

## 2013-12-12 ENCOUNTER — Encounter (HOSPITAL_COMMUNITY)
Admission: RE | Admit: 2013-12-12 | Discharge: 2013-12-12 | Disposition: A | Discharge status: Home / Self Care | Payer: Medicare Other | Source: Ambulatory Visit | Attending: Interventional Cardiology | Admitting: Interventional Cardiology

## 2013-12-14 ENCOUNTER — Encounter (HOSPITAL_COMMUNITY)
Admission: RE | Admit: 2013-12-14 | Discharge: 2013-12-14 | Disposition: A | Discharge status: Home / Self Care | Payer: Medicare Other | Source: Ambulatory Visit | Attending: Interventional Cardiology | Admitting: Interventional Cardiology

## 2013-12-16 ENCOUNTER — Encounter (HOSPITAL_COMMUNITY)
Admission: RE | Admit: 2013-12-16 | Discharge: 2013-12-16 | Disposition: A | Discharge status: Home / Self Care | Payer: Medicare Other | Source: Ambulatory Visit | Attending: Interventional Cardiology | Admitting: Interventional Cardiology

## 2013-12-19 ENCOUNTER — Encounter (HOSPITAL_COMMUNITY)
Admission: RE | Admit: 2013-12-19 | Discharge: 2013-12-19 | Disposition: A | Discharge status: Home / Self Care | Payer: Medicare Other | Source: Ambulatory Visit | Attending: Interventional Cardiology | Admitting: Interventional Cardiology

## 2013-12-19 DIAGNOSIS — H40019 Open angle with borderline findings, low risk, unspecified eye: Secondary | ICD-10-CM | POA: Diagnosis not present

## 2013-12-19 DIAGNOSIS — H353190 Nonexudative age-related macular degeneration, unspecified eye, stage unspecified: Secondary | ICD-10-CM | POA: Diagnosis not present

## 2013-12-19 DIAGNOSIS — Z961 Presence of intraocular lens: Secondary | ICD-10-CM | POA: Diagnosis not present

## 2013-12-19 DIAGNOSIS — H01029 Squamous blepharitis unspecified eye, unspecified eyelid: Secondary | ICD-10-CM | POA: Diagnosis not present

## 2013-12-21 ENCOUNTER — Encounter (HOSPITAL_COMMUNITY)
Admission: RE | Admit: 2013-12-21 | Discharge: 2013-12-21 | Disposition: A | Discharge status: Home / Self Care | Payer: Medicare Other | Source: Ambulatory Visit | Attending: Interventional Cardiology | Admitting: Interventional Cardiology

## 2013-12-23 ENCOUNTER — Encounter (HOSPITAL_COMMUNITY)
Admission: RE | Admit: 2013-12-23 | Discharge: 2013-12-23 | Disposition: A | Discharge status: Home / Self Care | Payer: Medicare Other | Source: Ambulatory Visit | Attending: Interventional Cardiology | Admitting: Interventional Cardiology

## 2013-12-26 ENCOUNTER — Encounter (HOSPITAL_COMMUNITY): Payer: Medicare Other

## 2013-12-28 ENCOUNTER — Encounter (HOSPITAL_COMMUNITY)
Admission: RE | Admit: 2013-12-28 | Discharge: 2013-12-28 | Disposition: A | Discharge status: Home / Self Care | Payer: Medicare Other | Source: Ambulatory Visit | Attending: Interventional Cardiology | Admitting: Interventional Cardiology

## 2013-12-30 ENCOUNTER — Telehealth (HOSPITAL_COMMUNITY): Payer: Self-pay | Admitting: *Deleted

## 2013-12-30 ENCOUNTER — Encounter (HOSPITAL_COMMUNITY)
Admission: RE | Admit: 2013-12-30 | Discharge: 2013-12-30 | Disposition: A | Discharge status: Home / Self Care | Payer: Medicare Other | Source: Ambulatory Visit | Attending: Interventional Cardiology | Admitting: Interventional Cardiology

## 2013-12-30 ENCOUNTER — Telehealth (HOSPITAL_COMMUNITY): Payer: Self-pay | Admitting: Cardiac Rehabilitation

## 2013-12-30 NOTE — Progress Notes (Addendum)
Derek [name] is noted to have some frequent PAC's this morning.  Blood pressure 140/70. [name] said he drank 3 cups of regular coffee this morning. Patient asked to avoid drinking caffeinated beverages. Patient remains in Sinus Rhythm. Will continue to monitor the patient throughout  the program. [name] told me he was running late and forgot to take his medications. Exercise stopped. Patient instructed to go home and take his medications. Patient states understanding. Exit blood pressure 122/70. Heart rate 73.

## 2014-01-02 ENCOUNTER — Encounter (HOSPITAL_COMMUNITY): Payer: Medicare Other

## 2014-01-02 ENCOUNTER — Encounter (HOSPITAL_COMMUNITY)
Admission: RE | Admit: 2014-01-02 | Discharge: 2014-01-02 | Disposition: A | Discharge status: Home / Self Care | Payer: Medicare Other | Source: Ambulatory Visit | Attending: Interventional Cardiology | Admitting: Interventional Cardiology

## 2014-01-04 ENCOUNTER — Encounter (HOSPITAL_COMMUNITY)
Admission: RE | Admit: 2014-01-04 | Discharge: 2014-01-04 | Disposition: A | Discharge status: Home / Self Care | Payer: Medicare Other | Source: Ambulatory Visit | Attending: Interventional Cardiology | Admitting: Interventional Cardiology

## 2014-01-06 ENCOUNTER — Encounter (HOSPITAL_COMMUNITY)
Admission: RE | Admit: 2014-01-06 | Discharge: 2014-01-06 | Disposition: A | Discharge status: Home / Self Care | Payer: Medicare Other | Source: Ambulatory Visit | Attending: Interventional Cardiology | Admitting: Interventional Cardiology

## 2014-01-06 DIAGNOSIS — Z9861 Coronary angioplasty status: Secondary | ICD-10-CM | POA: Insufficient documentation

## 2014-01-06 DIAGNOSIS — I252 Old myocardial infarction: Secondary | ICD-10-CM | POA: Diagnosis not present

## 2014-01-06 DIAGNOSIS — I2510 Atherosclerotic heart disease of native coronary artery without angina pectoris: Secondary | ICD-10-CM | POA: Diagnosis not present

## 2014-01-06 DIAGNOSIS — Z5189 Encounter for other specified aftercare: Secondary | ICD-10-CM | POA: Diagnosis not present

## 2014-01-09 ENCOUNTER — Encounter (HOSPITAL_COMMUNITY): Payer: Medicare Other

## 2014-01-09 ENCOUNTER — Encounter (HOSPITAL_COMMUNITY)
Admission: RE | Admit: 2014-01-09 | Discharge: 2014-01-09 | Disposition: A | Discharge status: Home / Self Care | Payer: Medicare Other | Source: Ambulatory Visit | Attending: Interventional Cardiology | Admitting: Interventional Cardiology

## 2014-01-11 ENCOUNTER — Encounter (HOSPITAL_COMMUNITY)
Admission: RE | Admit: 2014-01-11 | Discharge: 2014-01-11 | Disposition: A | Discharge status: Home / Self Care | Payer: Medicare Other | Source: Ambulatory Visit | Attending: Interventional Cardiology | Admitting: Interventional Cardiology

## 2014-01-13 ENCOUNTER — Encounter (HOSPITAL_COMMUNITY)
Admission: RE | Admit: 2014-01-13 | Discharge: 2014-01-13 | Disposition: A | Discharge status: Home / Self Care | Payer: Medicare Other | Source: Ambulatory Visit | Attending: Interventional Cardiology | Admitting: Interventional Cardiology

## 2014-01-16 ENCOUNTER — Encounter (HOSPITAL_COMMUNITY)
Admission: RE | Admit: 2014-01-16 | Discharge: 2014-01-16 | Disposition: A | Discharge status: Home / Self Care | Payer: Medicare Other | Source: Ambulatory Visit | Attending: Interventional Cardiology | Admitting: Interventional Cardiology

## 2014-01-18 ENCOUNTER — Encounter (HOSPITAL_COMMUNITY)
Admission: RE | Admit: 2014-01-18 | Discharge: 2014-01-18 | Disposition: A | Discharge status: Home / Self Care | Payer: Medicare Other | Source: Ambulatory Visit | Attending: Interventional Cardiology | Admitting: Interventional Cardiology

## 2014-01-20 ENCOUNTER — Encounter (HOSPITAL_COMMUNITY)
Admission: RE | Admit: 2014-01-20 | Discharge: 2014-01-20 | Disposition: A | Discharge status: Home / Self Care | Payer: Medicare Other | Source: Ambulatory Visit | Attending: Interventional Cardiology | Admitting: Interventional Cardiology

## 2014-01-23 ENCOUNTER — Encounter (HOSPITAL_COMMUNITY): Payer: Medicare Other

## 2014-01-23 ENCOUNTER — Telehealth (HOSPITAL_COMMUNITY): Payer: Self-pay | Admitting: *Deleted

## 2014-01-23 DIAGNOSIS — H353190 Nonexudative age-related macular degeneration, unspecified eye, stage unspecified: Secondary | ICD-10-CM | POA: Diagnosis not present

## 2014-01-23 DIAGNOSIS — H01029 Squamous blepharitis unspecified eye, unspecified eyelid: Secondary | ICD-10-CM | POA: Diagnosis not present

## 2014-01-23 DIAGNOSIS — H10439 Chronic follicular conjunctivitis, unspecified eye: Secondary | ICD-10-CM | POA: Diagnosis not present

## 2014-01-24 ENCOUNTER — Telehealth (HOSPITAL_COMMUNITY): Payer: Self-pay | Admitting: *Deleted

## 2014-01-25 ENCOUNTER — Encounter (HOSPITAL_COMMUNITY): Payer: Medicare Other

## 2014-01-27 ENCOUNTER — Encounter (HOSPITAL_COMMUNITY)
Admission: RE | Admit: 2014-01-27 | Discharge: 2014-01-27 | Disposition: A | Discharge status: Home / Self Care | Payer: Medicare Other | Source: Ambulatory Visit | Attending: Interventional Cardiology | Admitting: Interventional Cardiology

## 2014-02-01 ENCOUNTER — Encounter (HOSPITAL_COMMUNITY)
Admission: RE | Admit: 2014-02-01 | Discharge: 2014-02-01 | Disposition: A | Discharge status: Home / Self Care | Payer: Medicare Other | Source: Ambulatory Visit | Attending: Interventional Cardiology | Admitting: Interventional Cardiology

## 2014-02-03 ENCOUNTER — Encounter (HOSPITAL_COMMUNITY)
Admission: RE | Admit: 2014-02-03 | Discharge: 2014-02-03 | Disposition: A | Discharge status: Home / Self Care | Payer: Medicare Other | Source: Ambulatory Visit | Attending: Interventional Cardiology | Admitting: Interventional Cardiology

## 2014-02-06 ENCOUNTER — Encounter (HOSPITAL_COMMUNITY): Payer: Medicare Other

## 2014-02-07 DIAGNOSIS — S0100XA Unspecified open wound of scalp, initial encounter: Secondary | ICD-10-CM | POA: Diagnosis not present

## 2014-02-08 ENCOUNTER — Encounter (HOSPITAL_COMMUNITY): Payer: Medicare Other

## 2014-02-10 ENCOUNTER — Encounter (HOSPITAL_COMMUNITY): Payer: Medicare Other

## 2014-02-13 ENCOUNTER — Encounter (HOSPITAL_COMMUNITY): Payer: Medicare Other

## 2014-02-15 ENCOUNTER — Encounter (HOSPITAL_COMMUNITY): Payer: Medicare Other

## 2014-02-15 DIAGNOSIS — H40059 Ocular hypertension, unspecified eye: Secondary | ICD-10-CM | POA: Diagnosis not present

## 2014-02-15 DIAGNOSIS — H04129 Dry eye syndrome of unspecified lacrimal gland: Secondary | ICD-10-CM | POA: Diagnosis not present

## 2014-02-15 DIAGNOSIS — H1045 Other chronic allergic conjunctivitis: Secondary | ICD-10-CM | POA: Diagnosis not present

## 2014-02-15 DIAGNOSIS — H353190 Nonexudative age-related macular degeneration, unspecified eye, stage unspecified: Secondary | ICD-10-CM | POA: Diagnosis not present

## 2014-02-15 DIAGNOSIS — Z961 Presence of intraocular lens: Secondary | ICD-10-CM | POA: Diagnosis not present

## 2014-02-17 ENCOUNTER — Encounter (HOSPITAL_COMMUNITY): Payer: Medicare Other

## 2014-02-20 ENCOUNTER — Encounter (HOSPITAL_COMMUNITY): Payer: Medicare Other

## 2014-02-22 ENCOUNTER — Encounter (HOSPITAL_COMMUNITY): Payer: Medicare Other

## 2014-02-24 ENCOUNTER — Encounter (HOSPITAL_COMMUNITY): Payer: Medicare Other

## 2014-03-01 ENCOUNTER — Encounter (HOSPITAL_COMMUNITY)
Admission: RE | Admit: 2014-03-01 | Discharge: 2014-03-01 | Disposition: A | Discharge status: Home / Self Care | Payer: Self-pay | Source: Ambulatory Visit | Attending: Interventional Cardiology | Admitting: Interventional Cardiology

## 2014-03-01 DIAGNOSIS — I4891 Unspecified atrial fibrillation: Secondary | ICD-10-CM | POA: Insufficient documentation

## 2014-03-01 DIAGNOSIS — R079 Chest pain, unspecified: Secondary | ICD-10-CM | POA: Insufficient documentation

## 2014-03-01 DIAGNOSIS — I2510 Atherosclerotic heart disease of native coronary artery without angina pectoris: Secondary | ICD-10-CM | POA: Insufficient documentation

## 2014-03-01 DIAGNOSIS — I498 Other specified cardiac arrhythmias: Secondary | ICD-10-CM | POA: Insufficient documentation

## 2014-03-01 DIAGNOSIS — I219 Acute myocardial infarction, unspecified: Secondary | ICD-10-CM | POA: Insufficient documentation

## 2014-03-01 DIAGNOSIS — Z5189 Encounter for other specified aftercare: Secondary | ICD-10-CM | POA: Insufficient documentation

## 2014-03-03 ENCOUNTER — Encounter (HOSPITAL_COMMUNITY)
Admission: RE | Admit: 2014-03-03 | Discharge: 2014-03-03 | Disposition: A | Discharge status: Home / Self Care | Payer: Self-pay | Source: Ambulatory Visit | Attending: Interventional Cardiology | Admitting: Interventional Cardiology

## 2014-03-06 ENCOUNTER — Encounter (HOSPITAL_COMMUNITY)
Admission: RE | Admit: 2014-03-06 | Discharge: 2014-03-06 | Disposition: A | Discharge status: Home / Self Care | Payer: Self-pay | Source: Ambulatory Visit | Attending: Interventional Cardiology | Admitting: Interventional Cardiology

## 2014-03-08 ENCOUNTER — Encounter (HOSPITAL_COMMUNITY): Payer: Self-pay

## 2014-03-08 DIAGNOSIS — H35379 Puckering of macula, unspecified eye: Secondary | ICD-10-CM | POA: Diagnosis not present

## 2014-03-08 DIAGNOSIS — H43819 Vitreous degeneration, unspecified eye: Secondary | ICD-10-CM | POA: Diagnosis not present

## 2014-03-08 DIAGNOSIS — I4891 Unspecified atrial fibrillation: Secondary | ICD-10-CM | POA: Insufficient documentation

## 2014-03-08 DIAGNOSIS — I2510 Atherosclerotic heart disease of native coronary artery without angina pectoris: Secondary | ICD-10-CM | POA: Insufficient documentation

## 2014-03-08 DIAGNOSIS — Z5189 Encounter for other specified aftercare: Secondary | ICD-10-CM | POA: Insufficient documentation

## 2014-03-08 DIAGNOSIS — H353190 Nonexudative age-related macular degeneration, unspecified eye, stage unspecified: Secondary | ICD-10-CM | POA: Diagnosis not present

## 2014-03-08 DIAGNOSIS — R079 Chest pain, unspecified: Secondary | ICD-10-CM | POA: Insufficient documentation

## 2014-03-08 DIAGNOSIS — I219 Acute myocardial infarction, unspecified: Secondary | ICD-10-CM | POA: Insufficient documentation

## 2014-03-08 DIAGNOSIS — H26499 Other secondary cataract, unspecified eye: Secondary | ICD-10-CM | POA: Diagnosis not present

## 2014-03-08 DIAGNOSIS — I498 Other specified cardiac arrhythmias: Secondary | ICD-10-CM | POA: Insufficient documentation

## 2014-03-13 ENCOUNTER — Encounter (HOSPITAL_COMMUNITY)
Admission: RE | Admit: 2014-03-13 | Discharge: 2014-03-13 | Disposition: A | Discharge status: Home / Self Care | Payer: Self-pay | Source: Ambulatory Visit | Attending: Interventional Cardiology | Admitting: Interventional Cardiology

## 2014-03-15 ENCOUNTER — Encounter (HOSPITAL_COMMUNITY)
Admission: RE | Admit: 2014-03-15 | Discharge: 2014-03-15 | Disposition: A | Discharge status: Home / Self Care | Payer: Self-pay | Source: Ambulatory Visit | Attending: Interventional Cardiology | Admitting: Interventional Cardiology

## 2014-03-15 NOTE — Progress Notes (Signed)
Patient exercises in the cardiac rehab maintenance program, and after completing two 10-minutes stations of exercise, was noted to be unsteady on his feet, vital signs were stable. Patient was pitching forward and states that he felt unsteady since he woke up this morning. Maurice Small, RN, evaluated the patient and contacted patient's daughter who agreed to come pick him up from exercise. Patient states he took all his medications as usual this morning and ate half of a breakfast sandwich. Patient given a banana to eat and felt better after eating and resting. Patient's daughter arrived to pick him up, and patient felt normal at exit.

## 2014-03-17 ENCOUNTER — Encounter (HOSPITAL_COMMUNITY)
Admission: RE | Admit: 2014-03-17 | Discharge: 2014-03-17 | Disposition: A | Discharge status: Home / Self Care | Payer: Self-pay | Source: Ambulatory Visit | Attending: Interventional Cardiology | Admitting: Interventional Cardiology

## 2014-03-20 ENCOUNTER — Encounter (HOSPITAL_COMMUNITY)
Admission: RE | Admit: 2014-03-20 | Discharge: 2014-03-20 | Disposition: A | Discharge status: Home / Self Care | Payer: Self-pay | Source: Ambulatory Visit | Attending: Interventional Cardiology | Admitting: Interventional Cardiology

## 2014-03-22 ENCOUNTER — Encounter (HOSPITAL_COMMUNITY)
Admission: RE | Admit: 2014-03-22 | Discharge: 2014-03-22 | Disposition: A | Discharge status: Home / Self Care | Payer: Self-pay | Source: Ambulatory Visit | Attending: Interventional Cardiology | Admitting: Interventional Cardiology

## 2014-03-23 ENCOUNTER — Encounter: Payer: Self-pay | Admitting: *Deleted

## 2014-03-24 ENCOUNTER — Encounter (HOSPITAL_COMMUNITY)
Admission: RE | Admit: 2014-03-24 | Discharge: 2014-03-24 | Disposition: A | Discharge status: Home / Self Care | Payer: Self-pay | Source: Ambulatory Visit | Attending: Interventional Cardiology | Admitting: Interventional Cardiology

## 2014-03-27 ENCOUNTER — Encounter (HOSPITAL_COMMUNITY): Payer: Self-pay

## 2014-03-29 ENCOUNTER — Encounter (HOSPITAL_COMMUNITY): Payer: Self-pay

## 2014-03-30 ENCOUNTER — Encounter: Payer: Self-pay | Admitting: Nurse Practitioner

## 2014-03-30 ENCOUNTER — Ambulatory Visit (INDEPENDENT_AMBULATORY_CARE_PROVIDER_SITE_OTHER): Payer: Medicare Other | Admitting: Nurse Practitioner

## 2014-03-30 VITALS — BP 173/80 | HR 52 | Ht 68.5 in | Wt 191.0 lb

## 2014-03-30 DIAGNOSIS — G459 Transient cerebral ischemic attack, unspecified: Secondary | ICD-10-CM

## 2014-03-30 DIAGNOSIS — F0390 Unspecified dementia without behavioral disturbance: Secondary | ICD-10-CM | POA: Diagnosis not present

## 2014-03-30 DIAGNOSIS — I2510 Atherosclerotic heart disease of native coronary artery without angina pectoris: Secondary | ICD-10-CM

## 2014-03-30 DIAGNOSIS — Z8673 Personal history of transient ischemic attack (TIA), and cerebral infarction without residual deficits: Secondary | ICD-10-CM | POA: Diagnosis not present

## 2014-03-30 MED ORDER — DONEPEZIL HCL 10 MG PO TABS: 10.0000 mg | ORAL_TABLET | Freq: Every day | ORAL | Status: DC

## 2014-03-30 NOTE — Patient Instructions (Signed)
Memory score stable Continue Aricept at current dose will refill  Followup in 6 months with me Schedule sleep appointment with Dr. Brett Fairy

## 2014-03-30 NOTE — Progress Notes (Signed)
I have read the note, and I agree with the clinical assessment and plan.  WILLIS,CHARLES KEITH   

## 2014-03-30 NOTE — Progress Notes (Signed)
[facility]  PATIENT: Derek [name] DOB: 1927/05/23   REASON FOR VISIT: Followup for memory loss and history of TIA    HISTORY OF PRESENT ILLNESS:Derek [name], 80 year old male returns for followup. He was last seen 09/30/12.  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 He also has sleep apnea and follows up with Dr. Brett Fairy. He is with his wife  today and he denies further TIA episodes . He received a cardiac stent in November 2014. He is in  cardiac rehabilitation. He has no new neurologic complaints. He returns for reevaluation. He and wife  report memory is stable.   HISTORY: of a progressive memory disturbance. The patient was last seen in October of 2013, and he was in general doing well at that time on Aricept. The patient is still operating a motor vehicle, but he has limited his driving to very short distances. The patient was admitted to the hospital in March of 2014 with an episode of syncope. The patient was felt to have had a brief episode of cardiac arrest, requiring initiation of CPR. The patient underwent a cardiology evaluation, and the family indicates that he also had a cerebrovascular evaluation. The patient was placed on a CardioNet monitor for 30 days, and atrial fibrillation was noted to occur intermittently. The patient has since been on Eliquis as an anticoagulant medication. The patient has not had any further syncopal episodes. His cardiologist, Dr. Pernell Dupre has indicated that he should not be driving for 6 months. The patient returns for an evaluation. In June of 2014, the patient had fallen asleep outside on a hot day. The patient was noted to have a transient episode lasting about 10 minutes of garbled speech without associated facial asymmetry, numbness, or weakness of the  extremities. This fully resolved. EMS was called, but the patient never went to the hospital. The patient comes to this office for an evaluation.      REVIEW OF SYSTEMS: Full 14 system review of systems performed and notable only for those listed, all others are neg:  Constitutional: N/A  Cardiovascular: N/A  Ear/Nose/Throat: Hearing loss  Skin: N/A  Eyes: N/A  Respiratory: N/A  Gastroitestinal: N/A  Hematology/Lymphatic: N/A  Endocrine: N/A Musculoskeletal:N/A  Allergy/Immunology: N/A  Neurological: Memory loss Psychiatric: N/A Sleep : Obstructive sleep apnea  ALLERGIES: Allergies  Allergen Reactions  . Augmentin [Amoxicillin-Pot Clavulanate]     rash  . Penicillins   . Sulfur Rash    HOME MEDICATIONS: Outpatient Prescriptions Prior to Visit  Medication Sig Dispense Refill  . amLODipine (NORVASC) 10 MG tablet Take 10 mg by mouth daily.      Marland Kitchen apixaban (ELIQUIS) 5 MG TABS tablet Pt is to take 2.5mg  bid Daughter is aware to cut 5mg  tablet in half- twice a day      . aspirin 81 MG chewable tablet Chew 1 tablet (81 mg total) by mouth daily.      Marland Kitchen atorvastatin (LIPITOR) 40 MG tablet Take 1 tablet (40 mg total) by mouth daily at 6 PM.  90 tablet  3  . cholecalciferol (VITAMIN D) 1000 UNITS tablet Take 1,000 Units by mouth daily.      . Coenzyme Q10 (COQ10) 100 MG CAPS Take 1 capsule by mouth daily.       Marland Kitchen donepezil (ARICEPT)  10 MG tablet Take 1 tablet (10 mg total) by mouth at bedtime.  30 tablet  6  . losartan (COZAAR) 100 MG tablet Take 100 mg by mouth daily.      . LUTEIN PO Take by mouth.      . metoprolol tartrate (LOPRESSOR) 25 MG tablet Take 1 1/2 of a 25 mg tablet , twice a day.  90 tablet  11  . Misc Natural Products (PROSTATE) CAPS Take 1 capsule by mouth 2 (two) times daily.      . Multiple Vitamins-Minerals (CENTRUM SILVER PO) Take 1 tablet by mouth daily.       . nitroGLYCERIN (NITROSTAT) 0.4 MG SL tablet Place 1 tablet (0.4 mg total) under the tongue every 5  (five) minutes x 3 doses as needed for chest pain.  25 tablet  1  . omeprazole (PRILOSEC) 20 MG capsule Take 20 mg by mouth daily.       No facility-administered medications prior to visit.    PAST MEDICAL HISTORY: Past Medical History  Diagnosis Date  . CVA (cerebral vascular accident)   . Broken shoulder   . Cerebrovascular disease   . Sleep apnea   . Shingles     left chest  . Compression fracture   . Hypertension   . Pneumonia   . Atrial fibrillation   . Clavicle fracture   . Syncope   . History of pneumonia   . Complex sleep apnea syndrome     adapt SV titration EEP 6 min 6 and max 15 cm water.   Marland Kitchen PAF (paroxysmal atrial fibrillation) 07/12/2013  . Syncope and collapse 07/12/2013  . TIA (transient ischemic attack) 07/12/2013  . Long term (current) use of anticoagulants 07/12/2013    Eliquis    . Coronary atherosclerosis of native coronary artery 07/12/2013    Angina   . Acute myocardial infarction of other lateral wall, initial episode of care   . Sleep apnea with use of continuous positive airway pressure (CPAP) 11/04/2013    PAST SURGICAL HISTORY: Past Surgical History  Procedure Laterality Date  . Tonsillectomy    . Cataract extraction Bilateral   . Kyphoplasty    . Shoulder surgery Left     FAMILY HISTORY: Family History  Problem Relation Age of Onset  . Diabetes Mother   . Diabetes Father   . Cancer - Lung Brother   . Gait disorder Brother   . Cancer Daughter     SOCIAL HISTORY: History   Social History  . Marital Status: Widowed    Spouse Name: N/A    Number of Children: 2  . Years of Education: Engineer, maintenance (IT)   Occupational History  . retired    Social History Main Topics  . Smoking status: Never Smoker   . Smokeless tobacco: Never Used  . Alcohol Use: 0.6 oz/week    1 Glasses of wine per week     Comment: occasionally  . Drug Use: No  . Sexual Activity: Not on file   Other Topics Concern  . Not on file   Social History Narrative   Patient is  widowed Derek [name]), has 2 children   Patient is right handed   Education level is 4 year degree.   Caffeine consumption is 2-4 cups daily     PHYSICAL EXAM  Filed Vitals:   03/30/14 1504  BP: 173/80  Pulse: 52  Height: 5' 8.5" (1.74 m)  Weight: 191 lb (86.637 kg)   Body mass index is 28.62 kg/(m^2).  Generalized: Well developed, in no acute distress  Neurological examination  Mentation: Alert , MMSE 28/30 missing today's date and the year. AFT9 Follows all commands speech and language fluent  Cranial nerve II-XII: Pupils were equal round reactive to light extraocular movements were full, visual field were full on confrontational test. Facial sensation and strength were normal. hearing was intact to finger rubbing bilaterally. Uvula tongue midline. head turning and shoulder shrug were normal and symmetric.Tongue protrusion into cheek strength was normal.  Motor: normal bulk and tone, full strength in the BUE, BLE, fine finger movements normal, no pronator drift. No focal weakness  Coordination: finger-nose-finger, heel-to-shin bilaterally, no dysmetria  Reflexes: Brachioradialis 2/2, biceps 2/2, triceps 2/2, patellar 2/2, Achilles 2/2, plantar responses were flexor bilaterally.  Gait and Station: Rising up from seated position without assistance, normal stance, moderate stride, good arm swing, smooth turning, able to perform tiptoe, and heel walking without difficulty. Tandem gait is steady  DIAGNOSTIC DATA (LABS, IMAGING, TESTING) - I reviewed patient records, labs, notes, testing and imaging myself where available.  Lab Results  Component Value Date   WBC 7.2 10/19/2013   HGB 12.0* 10/19/2013   HCT 37.6* 10/19/2013   MCV 77.8* 10/19/2013   PLT 246.0 10/19/2013      Component Value Date/Time   NA 143 10/19/2013 1418   K 4.6 10/19/2013 1418   CL 108 10/19/2013 1418   CO2 27 10/19/2013 1418   GLUCOSE 118* 10/19/2013 1418   BUN 26* 10/19/2013 1418   CREATININE 1.5 10/19/2013 1418    CALCIUM 9.3 10/19/2013 1418   PROT 6.8 08/05/2013 2354   ALBUMIN 3.5 08/05/2013 2354   AST 41* 08/05/2013 2354   ALT 22 08/05/2013 2354   ALKPHOS 67 08/05/2013 2354   BILITOT 0.1* 08/05/2013 2354   GFRNONAA 47* 08/09/2013 0545   GFRAA 18* 08/09/2013 0545     Lab Results  Component Value Date   TSH 1.535 08/05/2013      ASSESSMENT AND PLAN  80 y.o. year old male  has a past medical history of CVA (cerebral vascular accident);  Cerebrovascular disease;   Hypertension;  Atrial fibrillation;  Syncope;  Complex sleep apnea syndrome; PAF (paroxysmal atrial fibrillation) (07/12/2013); Syncope and collapse (07/12/2013); TIA (transient ischemic attack) (07/12/2013);  Coronary atherosclerosis of native coronary artery (07/12/2013); Acute myocardial infarction of other lateral wall, initial episode of care; and Sleep apnea with use of continuous positive airway pressure (CPAP) (11/04/2013). here to followup for memory loss. His memory score is stable.  Continue Aricept at current dose will refill  Followup in 6 months with me Schedule sleep appointment with Dr. De Nurse, [facility], [facility], [facility] [address] [place], [place] [address] [phone]

## 2014-03-31 ENCOUNTER — Encounter (HOSPITAL_COMMUNITY)
Admission: RE | Admit: 2014-03-31 | Discharge: 2014-03-31 | Disposition: A | Discharge status: Home / Self Care | Payer: Self-pay | Source: Ambulatory Visit | Attending: Interventional Cardiology | Admitting: Interventional Cardiology

## 2014-04-03 ENCOUNTER — Encounter (HOSPITAL_COMMUNITY)
Admission: RE | Admit: 2014-04-03 | Discharge: 2014-04-03 | Disposition: A | Discharge status: Home / Self Care | Payer: Self-pay | Source: Ambulatory Visit | Attending: Interventional Cardiology | Admitting: Interventional Cardiology

## 2014-04-05 ENCOUNTER — Encounter (HOSPITAL_COMMUNITY): Payer: Self-pay

## 2014-04-07 ENCOUNTER — Encounter (HOSPITAL_COMMUNITY): Payer: Self-pay

## 2014-04-10 ENCOUNTER — Encounter (HOSPITAL_COMMUNITY)
Admission: RE | Admit: 2014-04-10 | Discharge: 2014-04-10 | Disposition: A | Discharge status: Home / Self Care | Payer: Self-pay | Source: Ambulatory Visit | Attending: Interventional Cardiology | Admitting: Interventional Cardiology

## 2014-04-10 DIAGNOSIS — I4891 Unspecified atrial fibrillation: Secondary | ICD-10-CM | POA: Insufficient documentation

## 2014-04-10 DIAGNOSIS — Z5189 Encounter for other specified aftercare: Secondary | ICD-10-CM | POA: Insufficient documentation

## 2014-04-10 DIAGNOSIS — I498 Other specified cardiac arrhythmias: Secondary | ICD-10-CM | POA: Insufficient documentation

## 2014-04-10 DIAGNOSIS — I2510 Atherosclerotic heart disease of native coronary artery without angina pectoris: Secondary | ICD-10-CM | POA: Insufficient documentation

## 2014-04-10 DIAGNOSIS — I219 Acute myocardial infarction, unspecified: Secondary | ICD-10-CM | POA: Insufficient documentation

## 2014-04-10 DIAGNOSIS — R079 Chest pain, unspecified: Secondary | ICD-10-CM | POA: Insufficient documentation

## 2014-04-12 ENCOUNTER — Encounter (HOSPITAL_COMMUNITY)
Admission: RE | Admit: 2014-04-12 | Discharge: 2014-04-12 | Disposition: A | Discharge status: Home / Self Care | Payer: Self-pay | Source: Ambulatory Visit | Attending: Interventional Cardiology | Admitting: Interventional Cardiology

## 2014-04-14 ENCOUNTER — Encounter (HOSPITAL_COMMUNITY)
Admission: RE | Admit: 2014-04-14 | Discharge: 2014-04-14 | Disposition: A | Discharge status: Home / Self Care | Payer: Self-pay | Source: Ambulatory Visit | Attending: Interventional Cardiology | Admitting: Interventional Cardiology

## 2014-04-17 ENCOUNTER — Encounter (HOSPITAL_COMMUNITY): Payer: Self-pay

## 2014-04-19 ENCOUNTER — Encounter (HOSPITAL_COMMUNITY): Payer: Self-pay

## 2014-04-21 ENCOUNTER — Encounter (HOSPITAL_COMMUNITY): Payer: Self-pay

## 2014-04-24 ENCOUNTER — Encounter (HOSPITAL_COMMUNITY)
Admission: RE | Admit: 2014-04-24 | Discharge: 2014-04-24 | Disposition: A | Discharge status: Home / Self Care | Payer: Self-pay | Source: Ambulatory Visit | Attending: Interventional Cardiology | Admitting: Interventional Cardiology

## 2014-04-26 ENCOUNTER — Encounter (HOSPITAL_COMMUNITY)
Admission: RE | Admit: 2014-04-26 | Discharge: 2014-04-26 | Disposition: A | Discharge status: Home / Self Care | Payer: Self-pay | Source: Ambulatory Visit | Attending: Interventional Cardiology | Admitting: Interventional Cardiology

## 2014-04-28 ENCOUNTER — Encounter (HOSPITAL_COMMUNITY): Payer: Self-pay

## 2014-05-01 ENCOUNTER — Encounter (HOSPITAL_COMMUNITY)
Admission: RE | Admit: 2014-05-01 | Discharge: 2014-05-01 | Disposition: A | Discharge status: Home / Self Care | Payer: Self-pay | Source: Ambulatory Visit | Attending: Interventional Cardiology | Admitting: Interventional Cardiology

## 2014-05-03 ENCOUNTER — Encounter (HOSPITAL_COMMUNITY)
Admission: RE | Admit: 2014-05-03 | Discharge: 2014-05-03 | Disposition: A | Discharge status: Home / Self Care | Payer: Self-pay | Source: Ambulatory Visit | Attending: Interventional Cardiology | Admitting: Interventional Cardiology

## 2014-05-03 NOTE — Progress Notes (Signed)
Patient's resting heart today at cardiac rehab initially was 114 bpm, patient has history of Afib. After resting, patient's HR was 91, patient denied any symptoms. Patient informed me that last Friday 04/28/14 he arrived at the main entrance of the hospital to come to cardiac rehab and was experiencing chest tightness. Patient states that he sat in the lobby for an hour until sx's resolved and returned home without coming to exercise. With patient's permission, patient's daughter was informed of symptoms that he experienced last Friday and patient's daughter will call his physician to set up an appt for patient to be seen by his PCP or cardiologist.

## 2014-05-05 ENCOUNTER — Ambulatory Visit: Payer: Medicare Other | Admitting: Neurology

## 2014-05-05 ENCOUNTER — Encounter (HOSPITAL_COMMUNITY): Admission: RE | Admit: 2014-05-05 | Payer: Self-pay | Source: Ambulatory Visit

## 2014-05-08 ENCOUNTER — Encounter (HOSPITAL_COMMUNITY): Payer: Self-pay

## 2014-05-08 DIAGNOSIS — H353190 Nonexudative age-related macular degeneration, unspecified eye, stage unspecified: Secondary | ICD-10-CM | POA: Diagnosis not present

## 2014-05-08 DIAGNOSIS — Z961 Presence of intraocular lens: Secondary | ICD-10-CM | POA: Diagnosis not present

## 2014-05-09 DIAGNOSIS — H40019 Open angle with borderline findings, low risk, unspecified eye: Secondary | ICD-10-CM | POA: Diagnosis not present

## 2014-05-09 DIAGNOSIS — H40059 Ocular hypertension, unspecified eye: Secondary | ICD-10-CM | POA: Diagnosis not present

## 2014-05-10 ENCOUNTER — Ambulatory Visit (INDEPENDENT_AMBULATORY_CARE_PROVIDER_SITE_OTHER): Payer: Medicare Other | Admitting: Cardiology

## 2014-05-10 ENCOUNTER — Encounter: Payer: Self-pay | Admitting: Cardiology

## 2014-05-10 ENCOUNTER — Encounter (HOSPITAL_COMMUNITY): Payer: Self-pay

## 2014-05-10 VITALS — BP 176/78 | HR 55 | Ht 68.5 in | Wt 194.0 lb

## 2014-05-10 DIAGNOSIS — I2510 Atherosclerotic heart disease of native coronary artery without angina pectoris: Secondary | ICD-10-CM

## 2014-05-10 DIAGNOSIS — I498 Other specified cardiac arrhythmias: Secondary | ICD-10-CM | POA: Insufficient documentation

## 2014-05-10 DIAGNOSIS — R079 Chest pain, unspecified: Secondary | ICD-10-CM | POA: Diagnosis not present

## 2014-05-10 DIAGNOSIS — I4891 Unspecified atrial fibrillation: Secondary | ICD-10-CM | POA: Insufficient documentation

## 2014-05-10 DIAGNOSIS — I219 Acute myocardial infarction, unspecified: Secondary | ICD-10-CM | POA: Insufficient documentation

## 2014-05-10 DIAGNOSIS — Z5189 Encounter for other specified aftercare: Secondary | ICD-10-CM | POA: Insufficient documentation

## 2014-05-10 MED ORDER — ISOSORBIDE MONONITRATE ER 30 MG PO TB24: 15.0000 mg | ORAL_TABLET | Freq: Every day | ORAL | Status: DC

## 2014-05-10 NOTE — Patient Instructions (Addendum)
Your physician has recommended you make the following change in your medication:  1.START IMDUR 30 MG TAKE 1/2 TAB DAILY   Call if symptoms of DIZZINESS is persist or worsen.    Your physician has requested that you have a lexiscan myoview. For further information please visit [email or web address]. Please follow instruction sheet, as given.  Your physician recommends that you schedule a follow-up appointment in: 3 weeks with Dr. Tamala Julian or one of our Physician Extender

## 2014-05-10 NOTE — Progress Notes (Signed)
05/10/2014 Derek [name]   [birth year]  [id number]  Primary Physicia Kandice Hams, MD Primary Cardiologist: Dr. Tamala Julian  HPI:  The patient is 80 year old male, followed by Dr. Tamala Julian, with a history of CAD, status post non-ST elevation MI in November 2014, resulting in PCI plus bare-metal stenting of his left circumflex. At that time he was noted to have a widely patent left main coronary artery as well as a widely patent left LAD. He also had a small nondominant right coronary artery which was widely patent. He was on dual antiplatelet therapy for one month.  He also has a history of paroxysmal atrial fibrillation. He is on rate control therapy with metoprolol and is also on Eliquis for stroke prophylaxis. At his last office visit, Dr. Tamala Julian increased his metoprolol to 37.5 mg twice a day, however due to dizziness the patient reduced his metoprolol back down to 25 mg twice a day. Since then, he has had improvement with less frequent episodes of dizziness.  He presents to clinic today for followup regarding recent episodes of chest pain, concerning for angina. He is accompanied by his daughter. Over the last 2 weeks he has had intermittent episodes of left-sided chest pressure, similar to his pre-MI angina however not as severe. Symptoms occur at rest and have been relieved with sublingual nitroglycerin. 4 days ago, he had a severe episode of left-sided chest discomfort associated with diaphoresis and near syncope. No significant dyspnea. However, this resolved after taking nitroglycerin x 3. He did not seek emergency evaluation at that time. He denies any recurrent anginal like symptoms since that event. He reports full medication compliance.   Today in clinic, his EKG demonstrates sinus bradycardia. Heart rate is 55 beats per minute. Compared to his prior EKG in March of this year, there are no significant changes. There are no ST/T-wave abnormalities. He is currently chest pain-free. His blood  pressure is elevated at 176/78 however his daughter reports that he did not take his a.m. medications earlier in the day and just took them prior to arriving in our office (taken less than 30 minutes ago).  Current Outpatient Prescriptions  Medication Sig Dispense Refill  . amLODipine (NORVASC) 10 MG tablet Take 10 mg by mouth daily.      Marland Kitchen apixaban (ELIQUIS) 5 MG TABS tablet Pt is to take 2.5mg  bid Daughter is aware to cut 5mg  tablet in half- twice a day      . aspirin 81 MG chewable tablet Chew 1 tablet (81 mg total) by mouth daily.      Marland Kitchen atorvastatin (LIPITOR) 40 MG tablet Take 1 tablet (40 mg total) by mouth daily at 6 PM.  90 tablet  3  . cholecalciferol (VITAMIN D) 1000 UNITS tablet Take 1,000 Units by mouth daily.      . Coenzyme Q10 (COQ10) 100 MG CAPS Take 1 capsule by mouth daily.       Marland Kitchen donepezil (ARICEPT) 10 MG tablet Take 1 tablet (10 mg total) by mouth at bedtime.  90 tablet  1  . losartan (COZAAR) 100 MG tablet Take 100 mg by mouth daily.      . metoprolol tartrate (LOPRESSOR) 25 MG tablet Take 1 1/2 of a 25 mg tablet , twice a day.  90 tablet  11  . Multiple Vitamins-Minerals (CENTRUM SILVER PO) Take 1 tablet by mouth daily.       . nitroGLYCERIN (NITROSTAT) 0.4 MG SL tablet Place 1 tablet (0.4 mg total) under the tongue  every 5 (five) minutes x 3 doses as needed for chest pain.  25 tablet  1  . omeprazole (PRILOSEC) 20 MG capsule Take 20 mg by mouth daily.       No current facility-administered medications for this visit.    Allergies  Allergen Reactions  . Augmentin [Amoxicillin-Pot Clavulanate]     rash  . Penicillins   . Sulfur Rash    History   Social History  . Marital Status: Widowed    Spouse Name: N/A    Number of Children: 2  . Years of Education: Engineer, maintenance (IT)   Occupational History  . retired    Social History Main Topics  . Smoking status: Never Smoker   . Smokeless tobacco: Never Used  . Alcohol Use: 0.6 oz/week    1 Glasses of wine per week      Comment: occasionally  . Drug Use: No  . Sexual Activity: Not on file   Other Topics Concern  . Not on file   Social History Narrative   Patient is widowed [name]), has 2 children   Patient is right handed   Education level is 4 year degree.   Caffeine consumption is 2-4 cups daily     Review of Systems: General: negative for chills, fever, night sweats or weight changes.  Cardiovascular: negative for chest pain, dyspnea on exertion, edema, orthopnea, palpitations, paroxysmal nocturnal dyspnea or shortness of breath Dermatological: negative for rash Respiratory: negative for cough or wheezing Urologic: negative for hematuria Abdominal: negative for nausea, vomiting, diarrhea, bright red blood per rectum, melena, or hematemesis Neurologic: negative for visual changes, syncope, or dizziness All other systems reviewed and are otherwise negative except as noted above.    Blood pressure 176/78, pulse 55, height 5' 8.5" (1.74 m), weight 194 lb (87.998 kg).  General appearance: alert, cooperative and no distress Neck: no carotid bruit and no JVD Lungs: clear to auscultation bilaterally Heart: regular rhtym, slow rate Extremities: no LEE Pulses: 2+ and symmetric Skin: warm and dry Neurologic: Grossly normal  EKG Sinus Bradycardia HR 55 bpm. No changes compared to prior EKG in March  ASSESSMENT AND PLAN:   1. Recurrent angina: Patient has had intermittent episodes of nitrate responsive left-sided chest pain similar to his prior anginal symptoms. He is already on a beta blocker and calcium channel blocker. Uppward titration of these medicines are prohibited due to bradycardia. Since he has had relief with sublingual nitroglycerin, we will place him on a long acting oral nitrate. We'll start with low dose Imdur, 15 mg daily. He has been instructed to monitor his blood pressure closely at home. Have also recommended that we evaluate for ischemia given history of bare-metal stenting to  his left circumflex in November 2014. If abnormal, would recommend a repeat left heart catheterization. Continue ASA as well as statin therapy.  2. Hypertension: Pressure is elevated in the 993 systolic however patient did not take his medicines until 30 minutes prior to arrival. Both he and his daughter report that he has been compliant on a regular basis, but just forgot to take his medicines earlier today. Continue amlodipine, metoprolol and Cozaar. As mentioned above we will also add low-dose Imdur for angina.   3. Paroxysmal atrial fibrillation: EKG today demonstrates sinus bradycardia. Heart rate 65 beats per minute. Continue rate control therapy with metoprolol as well as stroke prophylaxis with Eliquis.  4. Chronic oral anticoagulation: On Eliquis for A. Fib. He denies any abnormal bleeding. No falls.  PLAN  Add  Imdur 15 mg daily. Plan for a Lexiscan nuclear stress test rule out ischemia. Followup with Dr. Tamala Julian on extender in 2-3 weeks for reassessment and to review results of stress test.  Eryn Krejci, [facility] 05/10/2014 4:32 PM

## 2014-05-12 ENCOUNTER — Encounter (HOSPITAL_COMMUNITY)
Admission: RE | Admit: 2014-05-12 | Discharge: 2014-05-12 | Disposition: A | Discharge status: Home / Self Care | Payer: Self-pay | Source: Ambulatory Visit | Attending: Interventional Cardiology | Admitting: Interventional Cardiology

## 2014-05-16 DIAGNOSIS — H40059 Ocular hypertension, unspecified eye: Secondary | ICD-10-CM | POA: Diagnosis not present

## 2014-05-17 ENCOUNTER — Encounter (HOSPITAL_COMMUNITY): Payer: Self-pay

## 2014-05-19 ENCOUNTER — Encounter (HOSPITAL_COMMUNITY): Payer: Self-pay

## 2014-05-22 ENCOUNTER — Encounter (HOSPITAL_COMMUNITY)
Admission: RE | Admit: 2014-05-22 | Discharge: 2014-05-22 | Disposition: A | Discharge status: Home / Self Care | Payer: Self-pay | Source: Ambulatory Visit | Attending: Interventional Cardiology | Admitting: Interventional Cardiology

## 2014-05-23 ENCOUNTER — Ambulatory Visit (HOSPITAL_COMMUNITY): Payer: Medicare Other | Attending: Cardiology | Admitting: Radiology

## 2014-05-23 ENCOUNTER — Ambulatory Visit: Payer: Medicare Other | Admitting: Physician Assistant

## 2014-05-23 VITALS — BP 157/107 | Ht 68.5 in | Wt 188.0 lb

## 2014-05-23 DIAGNOSIS — R079 Chest pain, unspecified: Secondary | ICD-10-CM | POA: Insufficient documentation

## 2014-05-23 DIAGNOSIS — R55 Syncope and collapse: Secondary | ICD-10-CM | POA: Insufficient documentation

## 2014-05-23 DIAGNOSIS — I4891 Unspecified atrial fibrillation: Secondary | ICD-10-CM | POA: Diagnosis not present

## 2014-05-23 DIAGNOSIS — I2510 Atherosclerotic heart disease of native coronary artery without angina pectoris: Secondary | ICD-10-CM

## 2014-05-23 DIAGNOSIS — I10 Essential (primary) hypertension: Secondary | ICD-10-CM | POA: Insufficient documentation

## 2014-05-23 MED ORDER — TECHNETIUM TC 99M SESTAMIBI GENERIC - CARDIOLITE
11.0000 | Freq: Once | INTRAVENOUS | Status: AC | PRN
  Administered 2014-05-23: 11 via INTRAVENOUS

## 2014-05-23 MED ORDER — TECHNETIUM TC 99M SESTAMIBI GENERIC - CARDIOLITE
33.0000 | Freq: Once | INTRAVENOUS | Status: AC | PRN
  Administered 2014-05-23: 33 via INTRAVENOUS

## 2014-05-23 MED ORDER — REGADENOSON 0.4 MG/5ML IV SOLN
0.4000 mg | Freq: Once | INTRAVENOUS | Status: AC
  Administered 2014-05-23: 0.4 mg via INTRAVENOUS

## 2014-05-23 NOTE — Progress Notes (Signed)
[facility] [address] [place], [place] [address] [phone]    Cardiology Nuclear Med Study  Derek [name] is a 80 y.o. male     MRN : [id number]     DOB: 1927/07/26  Procedure Date: 05/23/2014  Nuclear Med Background Indication for Stress Test:  Evaluation for Ischemia and Stent Patency History:  '14 MPI: no results  ;History of Atrial Fib12  Cardiac Risk Factors: Hypertension  Symptoms:  Chest Pain with Exertion (last date of chest discomfort 4 days ago) and Near Syncope   Nuclear Pre-Procedure Caffeine/Decaff Intake:  None>12 hrs NPO After: 9:00pm   Lungs:  clear O2 Sat: 95% on room air. IV 0.9% NS with Angio Cath:  22g  IV Site: R Antecubital x 1, tolerated well IV Started by:  Irven Baltimore, RN  Chest Size (in):  44 Cup Size: n/a  Height: 5' 8.5" (1.74 m)  Weight:  188 lb (85.276 kg)  BMI:  Body mass index is 28.17 kg/(m^2). Tech Comments:  No medications today per patient. Irven Baltimore, RN.    Nuclear Med Study 1 or 2 day study: 1 day  Stress Test Type:  Carlton Adam  Reading MD: N/A  Order Authorizing Provider:  Daneen Schick, MD, and Lyda Jester, [facility]  Resting Radionuclide: Technetium 41m Sestamibi  Resting Radionuclide Dose: 11.0 mCi   Stress Radionuclide:  Technetium 15m Sestamibi  Stress Radionuclide Dose: 33.0 mCi           Stress Protocol Rest HR: 57 Stress HR: 78  Rest BP: 157/107 Stress BP: 151/101  Exercise Time (min): n/a METS: n/a   Predicted Max HR: 135 bpm % Max HR: 57.78 bpm Rate Pressure Product: 12246   Dose of Adenosine (mg):  n/a Dose of Lexiscan: 0.4 mg  Dose of Atropine (mg): n/a Dose of Dobutamine: n/a mcg/kg/min (at max HR)  Stress Test Technologist: Matilde Haymaker, RN  Nuclear Technologist:  Vedia Pereyra, CNMT     Rest Procedure:  Myocardial perfusion imaging was performed at rest 45 minutes following the intravenous administration of Technetium 24m Sestamibi. Rest ECG: NSR - Normal EKG   With PACs  Stress Procedure:  The patient received IV Lexiscan 0.4 mg over 15-seconds.  Technetium 84m Sestamibi injected at 30-seconds.  Quantitative spect images were obtained after a 45 minute delay. Stress ECG: No significant change from baseline ECG  QPS Raw Data Images:  Normal; no motion artifact; normal heart/lung ratio. Stress Images:  There is a medium sized area of moderate attenuation in the apical lateral segments.   Rest Images:  Normal homogeneous uptake in all areas of the myocardium. Subtraction (SDS):  The findings are consistent with apical lateral ischemia.  Transient Ischemic Dilatation (Normal <1.22):  1.25 Lung/Heart Ratio (Normal <0.45):  0.37  Quantitative Gated Spect Images QGS EDV:  NA QGS ESV:  NA  Impression Exercise Capacity:  Lexiscan with no exercise. BP Response:  Normal blood pressure response. Clinical Symptoms:  No significant symptoms noted. ECG Impression:  No significant ST segment change suggestive of ischemia. Comparison with Prior Nuclear Study: No images to compare  Overall Impression:  High risk stress nuclear study .   There is evidence of apical lateral ischemia. .  LV Ejection Fraction: Study not gated.  LV Wall Motion:  NA   Thayer Headings, Brooke Bonito., MD, [facility] 05/23/2014, 5:11 PM [address],  [place] Pager [phone]

## 2014-05-24 ENCOUNTER — Encounter (HOSPITAL_COMMUNITY): Admission: RE | Admit: 2014-05-24 | Payer: Self-pay | Source: Ambulatory Visit

## 2014-05-26 ENCOUNTER — Other Ambulatory Visit (INDEPENDENT_AMBULATORY_CARE_PROVIDER_SITE_OTHER): Payer: Medicare Other

## 2014-05-26 ENCOUNTER — Encounter (HOSPITAL_COMMUNITY): Payer: Self-pay | Admitting: Pharmacy Technician

## 2014-05-26 ENCOUNTER — Telehealth: Payer: Self-pay

## 2014-05-26 ENCOUNTER — Encounter (HOSPITAL_COMMUNITY)
Admission: RE | Admit: 2014-05-26 | Discharge: 2014-05-26 | Disposition: A | Discharge status: Home / Self Care | Payer: Self-pay | Source: Ambulatory Visit | Attending: Interventional Cardiology | Admitting: Interventional Cardiology

## 2014-05-26 DIAGNOSIS — I25119 Atherosclerotic heart disease of native coronary artery with unspecified angina pectoris: Secondary | ICD-10-CM

## 2014-05-26 DIAGNOSIS — I2510 Atherosclerotic heart disease of native coronary artery without angina pectoris: Secondary | ICD-10-CM

## 2014-05-26 DIAGNOSIS — R9439 Abnormal result of other cardiovascular function study: Secondary | ICD-10-CM

## 2014-05-26 DIAGNOSIS — I209 Angina pectoris, unspecified: Secondary | ICD-10-CM | POA: Diagnosis not present

## 2014-05-26 LAB — BASIC METABOLIC PANEL
BUN: 30 mg/dL — AB (ref 6–23)
CHLORIDE: 109 meq/L (ref 96–112)
CO2: 24 meq/L (ref 19–32)
Calcium: 9.1 mg/dL (ref 8.4–10.5)
Creatinine, Ser: 1.7 mg/dL — ABNORMAL HIGH (ref 0.4–1.5)
GFR: 40.63 mL/min — ABNORMAL LOW (ref >60.00)
GLUCOSE: 121 mg/dL — AB (ref 70–99)
POTASSIUM: 4.3 meq/L (ref 3.5–5.1)
Sodium: 140 mEq/L (ref 135–145)

## 2014-05-26 LAB — PROTIME-INR
INR: 1.2 ratio — ABNORMAL HIGH (ref 0.8–1.0)
Prothrombin Time: 13.2 s — ABNORMAL HIGH (ref 9.6–13.1)

## 2014-05-26 LAB — CBC WITH DIFFERENTIAL/PLATELET
BASOS ABS: 0 10*3/uL (ref 0.0–0.1)
Basophils Relative: 0.5 % (ref 0.0–3.0)
Eosinophils Absolute: 0.4 10*3/uL (ref 0.0–0.7)
Eosinophils Relative: 5.1 % — ABNORMAL HIGH (ref 0.0–5.0)
HEMATOCRIT: 38.6 % — AB (ref 39.0–52.0)
Hemoglobin: 12.6 g/dL — ABNORMAL LOW (ref 13.0–17.0)
LYMPHS ABS: 2 10*3/uL (ref 0.7–4.0)
Lymphocytes Relative: 29.8 % (ref 12.0–46.0)
MCHC: 32.5 g/dL (ref 30.0–36.0)
MCV: 82.1 fl (ref 78.0–100.0)
MONO ABS: 0.7 10*3/uL (ref 0.1–1.0)
Monocytes Relative: 9.7 % (ref 3.0–12.0)
Neutro Abs: 3.8 10*3/uL (ref 1.4–7.7)
Neutrophils Relative %: 54.9 % (ref 43.0–77.0)
PLATELETS: 214 10*3/uL (ref 150.0–400.0)
RBC: 4.7 Mil/uL (ref 4.22–5.81)
RDW: 16.7 % — AB (ref 11.5–15.5)
WBC: 6.8 10*3/uL (ref 4.0–10.5)

## 2014-05-26 MED ORDER — CLOPIDOGREL BISULFATE 75 MG PO TABS: 75.0000 mg | ORAL_TABLET | Freq: Every day | ORAL | Status: DC

## 2014-05-26 NOTE — Telephone Encounter (Signed)
Follow Up ° ° ° °Returning call from earlier. Please call. °

## 2014-05-26 NOTE — Telephone Encounter (Signed)
Pt and pt daughter aware of myoview results.and Dr.Smith recommendations. High risk nuclear study. Needs to stop Eliquis and start aspirin and Plavix(75 mg daily).Rx sent to pt pharmacy Set up coronary angio and possible PCI for next earliest time.Dr.Smith suspect stent has scar tissue  Pt will come ion to the office today for pre procedure labs.  Pt daughter given verbal preprocedure instructions. Pt daughter will pick up written instructions today.left at the frontdesk Pt cath sch for 10/21 @10 :30am with Dr.Smith.

## 2014-05-26 NOTE — Telephone Encounter (Signed)
Message copied by Lamar Laundry on Fri May 26, 2014  9:18 AM ------      Message from: Daneen Schick      Created: Wed May 24, 2014  5:39 PM       High risk nuclear study. Needs to stop Eliquis and start aspirin and Plavix(75 mg daily). Set up coronary angio and possible PCI for next earliest time with me. Suspect stent has scar tissue. ------

## 2014-05-26 NOTE — Progress Notes (Signed)
Patient arrived at exercise today and heart rate was noted to be 109 bpm on the automatic blood pressure cuff. Patient placed on Zoll monitor, and heart rate was 66 bpm with occasional PACs. Patient denies CP, denies SOB. Patient informed me that he has a follow-up medical appointment early next week to discuss results of the nuclear stress test that he had done on Tuesday 05/23/14. Pt was advised to hold exercise until after his follow-up appt. and did not exercise today. Faxed note and EKG to Dr. Tamala Julian for review. Patient remained asymptomatic and was advised to contact emergency services if he develops symptoms.

## 2014-05-26 NOTE — Telephone Encounter (Signed)
called to give pt mtoview results.[email or web address]

## 2014-05-29 ENCOUNTER — Encounter (HOSPITAL_COMMUNITY): Admission: RE | Admit: 2014-05-29 | Payer: Self-pay | Source: Ambulatory Visit

## 2014-05-29 ENCOUNTER — Encounter (HOSPITAL_COMMUNITY)
Admission: RE | Disposition: A | Discharge status: Home / Self Care | Payer: Self-pay | Source: Ambulatory Visit | Attending: Interventional Cardiology

## 2014-05-29 ENCOUNTER — Ambulatory Visit (HOSPITAL_COMMUNITY)
Admission: RE | Admit: 2014-05-29 | Discharge: 2014-05-29 | Disposition: A | Discharge status: Home / Self Care | Payer: Medicare Other | Source: Ambulatory Visit | Attending: Interventional Cardiology | Admitting: Interventional Cardiology

## 2014-05-29 DIAGNOSIS — I2510 Atherosclerotic heart disease of native coronary artery without angina pectoris: Secondary | ICD-10-CM | POA: Insufficient documentation

## 2014-05-29 DIAGNOSIS — Z9861 Coronary angioplasty status: Secondary | ICD-10-CM | POA: Diagnosis not present

## 2014-05-29 DIAGNOSIS — I209 Angina pectoris, unspecified: Secondary | ICD-10-CM | POA: Diagnosis not present

## 2014-05-29 DIAGNOSIS — I4891 Unspecified atrial fibrillation: Secondary | ICD-10-CM | POA: Insufficient documentation

## 2014-05-29 DIAGNOSIS — Z7901 Long term (current) use of anticoagulants: Secondary | ICD-10-CM | POA: Insufficient documentation

## 2014-05-29 DIAGNOSIS — R9439 Abnormal result of other cardiovascular function study: Secondary | ICD-10-CM

## 2014-05-29 DIAGNOSIS — I252 Old myocardial infarction: Secondary | ICD-10-CM | POA: Diagnosis not present

## 2014-05-29 DIAGNOSIS — I25118 Atherosclerotic heart disease of native coronary artery with other forms of angina pectoris: Secondary | ICD-10-CM

## 2014-05-29 DIAGNOSIS — Z7982 Long term (current) use of aspirin: Secondary | ICD-10-CM | POA: Diagnosis not present

## 2014-05-29 HISTORY — PX: LEFT HEART CATHETERIZATION WITH CORONARY ANGIOGRAM: SHX5451

## 2014-05-29 LAB — BASIC METABOLIC PANEL
Anion gap: 13 (ref 5–15)
BUN: 32 mg/dL — AB (ref 6–23)
CHLORIDE: 103 meq/L (ref 96–112)
CO2: 26 mEq/L (ref 19–32)
CREATININE: 1.42 mg/dL — AB (ref 0.50–1.35)
Calcium: 9.5 mg/dL (ref 8.4–10.5)
GFR calc non Af Amer: 43 mL/min — ABNORMAL LOW (ref >90)
GFR, EST AFRICAN AMERICAN: 50 mL/min — AB (ref >90)
Glucose, Bld: 121 mg/dL — ABNORMAL HIGH (ref 70–99)
Potassium: 4.1 mEq/L (ref 3.7–5.3)
Sodium: 142 mEq/L (ref 137–147)

## 2014-05-29 SURGERY — LEFT HEART CATHETERIZATION WITH CORONARY ANGIOGRAM
Anesthesia: LOCAL

## 2014-05-29 MED ORDER — MIDAZOLAM HCL 2 MG/2ML IJ SOLN
INTRAMUSCULAR | Status: AC
  Filled 2014-05-29: qty 2

## 2014-05-29 MED ORDER — HEPARIN (PORCINE) IN NACL 2-0.9 UNIT/ML-% IJ SOLN
INTRAMUSCULAR | Status: AC
  Filled 2014-05-29: qty 1000

## 2014-05-29 MED ORDER — ONDANSETRON HCL 4 MG/2ML IJ SOLN: 4.0000 mg | Freq: Four times a day (QID) | INTRAMUSCULAR | Status: DC | PRN

## 2014-05-29 MED ORDER — ASPIRIN 81 MG PO CHEW: 81.0000 mg | CHEWABLE_TABLET | ORAL | Status: DC

## 2014-05-29 MED ORDER — LIDOCAINE HCL (PF) 1 % IJ SOLN
INTRAMUSCULAR | Status: AC
  Filled 2014-05-29: qty 30

## 2014-05-29 MED ORDER — NITROGLYCERIN 1 MG/10 ML FOR IR/CATH LAB
INTRA_ARTERIAL | Status: AC
  Filled 2014-05-29: qty 10

## 2014-05-29 MED ORDER — SODIUM CHLORIDE 0.9 % IV SOLN: 250.0000 mL | INTRAVENOUS | Status: DC | PRN

## 2014-05-29 MED ORDER — SODIUM CHLORIDE 0.9 % IJ SOLN: 3.0000 mL | INTRAMUSCULAR | Status: DC | PRN

## 2014-05-29 MED ORDER — SODIUM CHLORIDE 0.9 % IJ SOLN: 3.0000 mL | Freq: Two times a day (BID) | INTRAMUSCULAR | Status: DC

## 2014-05-29 MED ORDER — APIXABAN 2.5 MG PO TABS: 2.5000 mg | ORAL_TABLET | Freq: Two times a day (BID) | ORAL | Status: DC

## 2014-05-29 MED ORDER — ACETAMINOPHEN 325 MG PO TABS: 650.0000 mg | ORAL_TABLET | ORAL | Status: DC | PRN

## 2014-05-29 MED ORDER — SODIUM CHLORIDE 0.9 % IV SOLN
INTRAVENOUS | Status: DC
  Administered 2014-05-29: 09:00:00 via INTRAVENOUS

## 2014-05-29 MED ORDER — ISOSORBIDE MONONITRATE ER 30 MG PO TB24: ORAL_TABLET | ORAL | Status: DC

## 2014-05-29 MED ORDER — VERAPAMIL HCL 2.5 MG/ML IV SOLN
INTRAVENOUS | Status: AC
  Filled 2014-05-29: qty 2

## 2014-05-29 MED ORDER — FENTANYL CITRATE 0.05 MG/ML IJ SOLN
INTRAMUSCULAR | Status: AC
  Filled 2014-05-29: qty 2

## 2014-05-29 NOTE — H&P (View-Only) (Signed)
05/10/2014 Derek [name]   05-08-1927  [id number]  Primary Physicia Kandice Hams, MD Primary Cardiologist: Dr. Tamala Julian  HPI:  The patient is 80 year old male, followed by Dr. Tamala Julian, with a history of CAD, status post non-ST elevation MI in November 2014, resulting in PCI plus bare-metal stenting of his left circumflex. At that time he was noted to have a widely patent left main coronary artery as well as a widely patent left LAD. He also had a small nondominant right coronary artery which was widely patent. He was on dual antiplatelet therapy for one month.  He also has a history of paroxysmal atrial fibrillation. He is on rate control therapy with metoprolol and is also on Eliquis for stroke prophylaxis. At his last office visit, Dr. Tamala Julian increased his metoprolol to 37.5 mg twice a day, however due to dizziness the patient reduced his metoprolol back down to 25 mg twice a day. Since then, he has had improvement with less frequent episodes of dizziness.  He presents to clinic today for followup regarding recent episodes of chest pain, concerning for angina. He is accompanied by his daughter. Over the last 2 weeks he has had intermittent episodes of left-sided chest pressure, similar to his pre-MI angina however not as severe. Symptoms occur at rest and have been relieved with sublingual nitroglycerin. 4 days ago, he had a severe episode of left-sided chest discomfort associated with diaphoresis and near syncope. No significant dyspnea. However, this resolved after taking nitroglycerin x 3. He did not seek emergency evaluation at that time. He denies any recurrent anginal like symptoms since that event. He reports full medication compliance.   Today in clinic, his EKG demonstrates sinus bradycardia. Heart rate is 55 beats per minute. Compared to his prior EKG in March of this year, there are no significant changes. There are no ST/T-wave abnormalities. He is currently chest pain-free. His blood  pressure is elevated at 176/78 however his daughter reports that he did not take his a.m. medications earlier in the day and just took them prior to arriving in our office (taken less than 30 minutes ago).  Current Outpatient Prescriptions  Medication Sig Dispense Refill  . amLODipine (NORVASC) 10 MG tablet Take 10 mg by mouth daily.      Marland Kitchen apixaban (ELIQUIS) 5 MG TABS tablet Pt is to take 2.5mg  bid Daughter is aware to cut 5mg  tablet in half- twice a day      . aspirin 81 MG chewable tablet Chew 1 tablet (81 mg total) by mouth daily.      Marland Kitchen atorvastatin (LIPITOR) 40 MG tablet Take 1 tablet (40 mg total) by mouth daily at 6 PM.  90 tablet  3  . cholecalciferol (VITAMIN D) 1000 UNITS tablet Take 1,000 Units by mouth daily.      . Coenzyme Q10 (COQ10) 100 MG CAPS Take 1 capsule by mouth daily.       Marland Kitchen donepezil (ARICEPT) 10 MG tablet Take 1 tablet (10 mg total) by mouth at bedtime.  90 tablet  1  . losartan (COZAAR) 100 MG tablet Take 100 mg by mouth daily.      . metoprolol tartrate (LOPRESSOR) 25 MG tablet Take 1 1/2 of a 25 mg tablet , twice a day.  90 tablet  11  . Multiple Vitamins-Minerals (CENTRUM SILVER PO) Take 1 tablet by mouth daily.       . nitroGLYCERIN (NITROSTAT) 0.4 MG SL tablet Place 1 tablet (0.4 mg total) under the tongue  every 5 (five) minutes x 3 doses as needed for chest pain.  25 tablet  1  . omeprazole (PRILOSEC) 20 MG capsule Take 20 mg by mouth daily.       No current facility-administered medications for this visit.    Allergies  Allergen Reactions  . Augmentin [Amoxicillin-Pot Clavulanate]     rash  . Penicillins   . Sulfur Rash    History   Social History  . Marital Status: Widowed    Spouse Name: N/A    Number of Children: 2  . Years of Education: Engineer, maintenance (IT)   Occupational History  . retired    Social History Main Topics  . Smoking status: Never Smoker   . Smokeless tobacco: Never Used  . Alcohol Use: 0.6 oz/week    1 Glasses of wine per week      Comment: occasionally  . Drug Use: No  . Sexual Activity: Not on file   Other Topics Concern  . Not on file   Social History Narrative   Patient is widowed [name]), has 2 children   Patient is right handed   Education level is 4 year degree.   Caffeine consumption is 2-4 cups daily     Review of Systems: General: negative for chills, fever, night sweats or weight changes.  Cardiovascular: negative for chest pain, dyspnea on exertion, edema, orthopnea, palpitations, paroxysmal nocturnal dyspnea or shortness of breath Dermatological: negative for rash Respiratory: negative for cough or wheezing Urologic: negative for hematuria Abdominal: negative for nausea, vomiting, diarrhea, bright red blood per rectum, melena, or hematemesis Neurologic: negative for visual changes, syncope, or dizziness All other systems reviewed and are otherwise negative except as noted above.    Blood pressure 176/78, pulse 55, height 5' 8.5" (1.74 m), weight 194 lb (87.998 kg).  General appearance: alert, cooperative and no distress Neck: no carotid bruit and no JVD Lungs: clear to auscultation bilaterally Heart: regular rhtym, slow rate Extremities: no LEE Pulses: 2+ and symmetric Skin: warm and dry Neurologic: Grossly normal  EKG Sinus Bradycardia HR 55 bpm. No changes compared to prior EKG in March  ASSESSMENT AND PLAN:   1. Recurrent angina: Patient has had intermittent episodes of nitrate responsive left-sided chest pain similar to his prior anginal symptoms. He is already on a beta blocker and calcium channel blocker. Uppward titration of these medicines are prohibited due to bradycardia. Since he has had relief with sublingual nitroglycerin, we will place him on a long acting oral nitrate. We'll start with low dose Imdur, 15 mg daily. He has been instructed to monitor his blood pressure closely at home. Have also recommended that we evaluate for ischemia given history of bare-metal stenting to  his left circumflex in November 2014. If abnormal, would recommend a repeat left heart catheterization. Continue ASA as well as statin therapy.  2. Hypertension: Pressure is elevated in the 409 systolic however patient did not take his medicines until 30 minutes prior to arrival. Both he and his daughter report that he has been compliant on a regular basis, but just forgot to take his medicines earlier today. Continue amlodipine, metoprolol and Cozaar. As mentioned above we will also add low-dose Imdur for angina.   3. Paroxysmal atrial fibrillation: EKG today demonstrates sinus bradycardia. Heart rate 65 beats per minute. Continue rate control therapy with metoprolol as well as stroke prophylaxis with Eliquis.  4. Chronic oral anticoagulation: On Eliquis for A. Fib. He denies any abnormal bleeding. No falls.  PLAN  Add  Imdur 15 mg daily. Plan for a Lexiscan nuclear stress test rule out ischemia. Followup with Dr. Tamala Julian on extender in 2-3 weeks for reassessment and to review results of stress test.  SIMMONS, [facility] 05/10/2014 4:32 PM

## 2014-05-29 NOTE — Discharge Instructions (Signed)
Radial Site Care °Refer to this sheet in the next few weeks. These instructions provide you with information on caring for yourself after your procedure. Your caregiver may also give you more specific instructions. Your treatment has been planned according to current medical practices, but problems sometimes occur. Call your caregiver if you have any problems or questions after your procedure. °HOME CARE INSTRUCTIONS °· You may shower the day after the procedure. Remove the bandage (dressing) and gently wash the site with plain soap and water. Gently pat the site dry. °· Do not apply powder or lotion to the site. °· Do not submerge the affected site in water for 3 to 5 days. °· Inspect the site at least twice daily. °· Do not flex or bend the affected arm for 24 hours. °· No lifting over 5 pounds (2.3 kg) for 5 days after your procedure. °· Do not drive home if you are discharged the same day of the procedure. Have someone else drive you. °· You may drive 24 hours after the procedure unless otherwise instructed by your caregiver. °· Do not operate machinery or power tools for 24 hours. °· A responsible adult should be with you for the first 24 hours after you arrive home. °What to expect: °· Any bruising will usually fade within 1 to 2 weeks. °· Blood that collects in the tissue (hematoma) may be painful to the touch. It should usually decrease in size and tenderness within 1 to 2 weeks. °SEEK IMMEDIATE MEDICAL CARE IF: °· You have unusual pain at the radial site. °· You have redness, warmth, swelling, or pain at the radial site. °· You have drainage (other than a small amount of blood on the dressing). °· You have chills. °· You have a fever or persistent symptoms for more than 72 hours. °· You have a fever and your symptoms suddenly get worse. °· Your arm becomes pale, cool, tingly, or numb. °· You have heavy bleeding from the site. Hold pressure on the site. °Document Released: 09/27/2010 Document Revised:  11/17/2011 Document Reviewed: 09/27/2010 °ExitCare® Patient Information ©2015 [employer]. This information is not intended to replace advice given to you by your health care provider. Make sure you discuss any questions you have with your health care provider. ° °

## 2014-05-29 NOTE — Interval H&P Note (Signed)
Cath Lab Visit (complete for each Cath Lab visit)  Clinical Evaluation Leading to the Procedure:   ACS: No.  Non-ACS:    Anginal Classification: CCS III  Anti-ischemic medical therapy: Maximal Therapy (2 or more classes of medications)  Non-Invasive Test Results: High-risk stress test findings: cardiac mortality >3%/year  Prior CABG: No previous CABG      History and Physical Interval Note:  05/29/2014 12:10 PM  Derek [name]  has presented today for surgery, with the diagnosis of abnormal nuc  The various methods of treatment have been discussed with the patient and family. After consideration of risks, benefits and other options for treatment, the patient has consented to  Procedure(s): LEFT HEART CATHETERIZATION WITH CORONARY ANGIOGRAM (N/A) as a surgical intervention .  The patient's history has been reviewed, patient examined, no change in status, stable for surgery.  I have reviewed the patient's chart and labs.  Questions were answered to the patient's satisfaction.     Sinclair Grooms

## 2014-05-29 NOTE — CV Procedure (Signed)
     Left Heart Catheterization with Coronary Angiography  Report  Derek [name]  80 y.o.  male 1927-01-16  Procedure Date: 05/29/2014 Referring Physician: HWB Blenda Bridegroom, M.D. Primary Cardiologist:: Same  INDICATIONS: High-risk myocardial perfusion study  PROCEDURE: 1. Left heart catheterization; 2. Coronary angiography; 3. Left ventriculography  CONSENT:  The risks, benefits, and details of the procedure were explained in detail to the patient. Risks including death, stroke, heart attack, kidney injury, allergy, limb ischemia, bleeding and radiation injury were discussed.  The patient verbalized understanding and wanted to proceed.  Informed written consent was obtained.  PROCEDURE TECHNIQUE:  After Xylocaine anesthesia a 5 French Slender sheath was placed in the right radial artery with an angiocath and the modified Seldinger technique.  Coronary angiography was done using a 5 F 5 [place] cath JR 4 and JL 3.5 cm diagnostic catheters.  Left ventriculography was done using the JR 4 catheter and hand injection.   Images were reviewed and the case was terminated. Diffuse circumflex marginal disease as well as diagonal disease. Not much change in anatomy compared to December 2014   CONTRAST:  Total of 75 cc.  COMPLICATIONS:  None   HEMODYNAMICS:  Aortic pressure 155/66 mmHg; LV pressure 156/12 mmHg; LVEDP 18 mm mercury  ANGIOGRAPHIC DATA:   The left main coronary artery is widely patent/normal.  The left anterior descending artery is patent with proximal irregularities obstructing up to 50%. The first diagonal is diffusely diseased with the smaller of 2 branches containing 90% stenosis the larger branch contains 80% stenosis. The proximal diagonal contains a 50-60% narrowing. The distal LAD contains 50% narrowing. LAD wraps around the left ventricular apex..  The left circumflex artery is dominant. 6 obtuse marginals and the PDA arise from the circumflex. There is a proximal to mid  vessel stent. The first 2 branches the jail by the previously placed stent. The stent is widely patent. The proximal portion of each of the first 2 obtuse marginals contained 50-70% narrowing. The third and fourth obtuse marginals are patent. They are very small in distribution. A much larger 5th obtuse marginal contains segmental 70-80% narrowing. The PDA contains high-grade mid vessel stenosis of at least 90%. The circumflex disease has not significantly changed compared to 2014.  The right coronary artery is nondominant and diffusely diseased.  LEFT VENTRICULOGRAM:  Left ventricular angiogram was done in the 30 RAO projection and revealed mild anteroapical hypokinesis. EF is 55%.   IMPRESSIONS:  1. Widely patent proximal LAD and circumflex. The circumflex stent is widely patent. 2. High-grade disease in the first diagonal, fifth obtuse marginal, and PDA (of circumflex origin) accounting for the high risk abnormality noted on perfusion scanning. 3. Diffusely diseased nondominant RCA 4. Preserved overall LV function with anteroapical hypokinesis  RECOMMENDATION:  Medical therapy with the patient resuming Eliquis discontinuing antiplatelet therapy. Nitroglycerin for episodes of chest pain.

## 2014-05-30 ENCOUNTER — Ambulatory Visit: Payer: Medicare Other | Admitting: Physician Assistant

## 2014-05-31 ENCOUNTER — Encounter (HOSPITAL_COMMUNITY): Payer: Self-pay

## 2014-05-31 NOTE — Telephone Encounter (Signed)
See attached sleep report

## 2014-06-02 ENCOUNTER — Encounter (HOSPITAL_COMMUNITY): Payer: Self-pay

## 2014-06-05 ENCOUNTER — Encounter (HOSPITAL_COMMUNITY): Payer: Self-pay

## 2014-06-07 ENCOUNTER — Encounter (HOSPITAL_COMMUNITY): Payer: Self-pay

## 2014-06-13 DIAGNOSIS — H40053 Ocular hypertension, bilateral: Secondary | ICD-10-CM | POA: Diagnosis not present

## 2014-06-14 ENCOUNTER — Encounter (HOSPITAL_COMMUNITY): Payer: Self-pay

## 2014-06-14 DIAGNOSIS — I499 Cardiac arrhythmia, unspecified: Secondary | ICD-10-CM | POA: Insufficient documentation

## 2014-06-14 DIAGNOSIS — I4891 Unspecified atrial fibrillation: Secondary | ICD-10-CM | POA: Insufficient documentation

## 2014-06-14 DIAGNOSIS — Z5189 Encounter for other specified aftercare: Secondary | ICD-10-CM | POA: Insufficient documentation

## 2014-06-14 DIAGNOSIS — I2510 Atherosclerotic heart disease of native coronary artery without angina pectoris: Secondary | ICD-10-CM | POA: Insufficient documentation

## 2014-06-14 DIAGNOSIS — I252 Old myocardial infarction: Secondary | ICD-10-CM | POA: Insufficient documentation

## 2014-06-14 DIAGNOSIS — R079 Chest pain, unspecified: Secondary | ICD-10-CM | POA: Insufficient documentation

## 2014-06-16 ENCOUNTER — Encounter (HOSPITAL_COMMUNITY)
Admission: RE | Admit: 2014-06-16 | Discharge: 2014-06-16 | Disposition: A | Discharge status: Home / Self Care | Payer: Self-pay | Source: Ambulatory Visit | Attending: Interventional Cardiology | Admitting: Interventional Cardiology

## 2014-06-18 ENCOUNTER — Other Ambulatory Visit: Payer: Self-pay | Admitting: Interventional Cardiology

## 2014-06-19 ENCOUNTER — Encounter (HOSPITAL_COMMUNITY)
Admission: RE | Admit: 2014-06-19 | Discharge: 2014-06-19 | Disposition: A | Discharge status: Home / Self Care | Payer: Self-pay | Source: Ambulatory Visit | Attending: Interventional Cardiology | Admitting: Interventional Cardiology

## 2014-06-21 ENCOUNTER — Encounter (HOSPITAL_COMMUNITY): Payer: Self-pay

## 2014-06-23 ENCOUNTER — Encounter (HOSPITAL_COMMUNITY): Payer: Self-pay

## 2014-06-26 ENCOUNTER — Encounter (HOSPITAL_COMMUNITY)
Admission: RE | Admit: 2014-06-26 | Discharge: 2014-06-26 | Disposition: A | Discharge status: Home / Self Care | Payer: Self-pay | Source: Ambulatory Visit | Attending: Interventional Cardiology | Admitting: Interventional Cardiology

## 2014-06-28 ENCOUNTER — Encounter (HOSPITAL_COMMUNITY)
Admission: RE | Admit: 2014-06-28 | Discharge: 2014-06-28 | Disposition: A | Discharge status: Home / Self Care | Payer: Self-pay | Source: Ambulatory Visit | Attending: Interventional Cardiology | Admitting: Interventional Cardiology

## 2014-06-30 ENCOUNTER — Encounter (HOSPITAL_COMMUNITY): Payer: Self-pay

## 2014-07-03 ENCOUNTER — Encounter (HOSPITAL_COMMUNITY)
Admission: RE | Admit: 2014-07-03 | Discharge: 2014-07-03 | Disposition: A | Discharge status: Home / Self Care | Payer: Self-pay | Source: Ambulatory Visit | Attending: Interventional Cardiology | Admitting: Interventional Cardiology

## 2014-07-05 ENCOUNTER — Ambulatory Visit (INDEPENDENT_AMBULATORY_CARE_PROVIDER_SITE_OTHER): Payer: Medicare Other | Admitting: Physician Assistant

## 2014-07-05 ENCOUNTER — Encounter (HOSPITAL_COMMUNITY): Payer: Self-pay

## 2014-07-05 ENCOUNTER — Encounter: Payer: Self-pay | Admitting: Physician Assistant

## 2014-07-05 VITALS — BP 148/72 | HR 56 | Ht 70.0 in | Wt 192.0 lb

## 2014-07-05 DIAGNOSIS — E785 Hyperlipidemia, unspecified: Secondary | ICD-10-CM | POA: Diagnosis not present

## 2014-07-05 DIAGNOSIS — G4730 Sleep apnea, unspecified: Secondary | ICD-10-CM

## 2014-07-05 DIAGNOSIS — I2510 Atherosclerotic heart disease of native coronary artery without angina pectoris: Secondary | ICD-10-CM | POA: Diagnosis not present

## 2014-07-05 DIAGNOSIS — G4733 Obstructive sleep apnea (adult) (pediatric): Secondary | ICD-10-CM

## 2014-07-05 DIAGNOSIS — I10 Essential (primary) hypertension: Secondary | ICD-10-CM

## 2014-07-05 DIAGNOSIS — R42 Dizziness and giddiness: Secondary | ICD-10-CM

## 2014-07-05 DIAGNOSIS — I480 Paroxysmal atrial fibrillation: Secondary | ICD-10-CM | POA: Diagnosis not present

## 2014-07-05 NOTE — Progress Notes (Signed)
Cardiology Office Note   Date:  07/05/2014   ID:  Derek [name], DOB Dec 01, 1926, MRN [id number]  PCP:  Kandice Hams, MD  Cardiologist:  Dr. Daneen Schick     History of Present Illness: Derek [name] is a 80 y.o. male with a history of CAD, PAF on Eliquis, sleep apnea, prior TIA/CVA, HTN, HL. He was admitted in November 2014 with a non-STEMI in the setting of atrial fibrillation with RVR. Cardiac catheterization demonstrated a ruptured plaque with associated moderate to severe stenosis in the circumflex which was treated with a bare metal stent.   He was recently seen in the office by Lyda Jester, PA-C for chest pain.  Symptoms were associated with diaphoresis and near syncope. He had good response to nitroglycerin. Decision was made to pursue stress testing. He was also placed on isosorbide. Myoview was abnormal with evidence of apical lateral ischemia. Cardiac catheterization was ranged. This demonstrated a widely patent proximal LAD and circumflex. The circumflex stent was widely patent. There was high-grade disease in the first diagonal, fifth obtuse marginal, and PDA (of circumflex origin) accounting for the high risk abnormality noted on perfusion scanning and a diffusely diseased nondominant RCA.  LVF was preserved with anteroapical hypokinesis.  Continued medical Rx was recommended.  He was restarted on Eliquis.    He returns for FU.  The patient denies chest pain, significant shortness of breath, syncope, orthopnea, PND or significant pedal edema. He attends cardiac rehab maintenance program 2-3 days a week.    Studies:  - Echo 11/14): EF 55-60%, no RWMA, grade 2 DD, mild aortic stenosis (mean 15 mmHg), mild LAE  - Nuclear (9/15):  High risk stress nuclear study . There is evidence of apical lateral ischemia.   - LHC (9/15):  pLAD 50%, D1 (smaller br 90%/larger br 80%, pDx 50-60%, dLAD 50%, prox-mid CFX stent ok, pOM1 and OM2 50-70% (jailed by stent), OM5 70-80%, mPDA  90% (CFX dsz unchanged from 2014), RCA diff dsz, EF 55%, mild ant-apical HK >> med Rx   Recent Labs/Images:  Recent Labs  08/05/13 2354  05/26/14 1357 05/29/14 0908  NA 140  < > 140 142  K 4.2  < > 4.3 4.1  BUN 17  < > 30* 32*  CREATININE 1.26  < > 1.7* 1.42*  ALT 22  --   --   --   HGB 12.5*  < > 12.6*  --   TSH 1.535  --   --   --   < > = values in this interval not displayed.     Wt Readings from Last 3 Encounters:  07/05/14 192 lb (87.091 kg)  05/29/14 185 lb (83.915 kg)  05/29/14 185 lb (83.915 kg)     Past Medical History  Diagnosis Date  . CVA (cerebral vascular accident)   . Broken shoulder   . Cerebrovascular disease     MRI/MRA in 2007 - 50% stenosis of supraclinoid ICA, left  . Shingles     left chest  . Compression fracture   . Hypertension   . PAF (paroxysmal atrial fibrillation)     hx of NSTEMI in setting of AF with RVR 07/2013;  Eliquis for AC  . Clavicle fracture   . Syncope   . History of pneumonia   . Complex sleep apnea syndrome     adapt SV titration EEP 6 min 6 and max 15 cm water.   Marland Kitchen TIA (transient ischemic attack) 07/12/2013  . Coronary atherosclerosis  of native coronary artery 07/12/2013    a. NSTEMI in setting of AF with RVR 11/14 >> BMS to CFX;  b. Nuclear (9/15):  High risk - apical lateral ischemia >> c. LHC (9/15):  pLAD 50%, D1 (smaller br 90%/larger br 80%, pDx 50-60%, dLAD 50%, prox-mid CFX stent ok, pOM1 and OM2 50-70% (jailed by stent), OM5 70-80%, mPDA 90% (CFX dsz unchanged from 2014), RCA diff dsz, EF 55%, mild ant-apical HK >>  Med Rx  . Hx of echocardiogram     a. Echo 11/14): EF 55-60%, no RWMA, grade 2 DD, mild aortic stenosis (mean 15 mmHg), mild LAE    Current Outpatient Prescriptions  Medication Sig Dispense Refill  . amLODipine (NORVASC) 10 MG tablet Take 10 mg by mouth daily.      Marland Kitchen apixaban (ELIQUIS) 2.5 MG TABS tablet Take 1 tablet (2.5 mg total) by mouth 2 (two) times daily.  60 tablet    . atorvastatin (LIPITOR)  40 MG tablet Take 1 tablet (40 mg total) by mouth daily at 6 PM.  90 tablet  3  . cholecalciferol (VITAMIN D) 1000 UNITS tablet Take 1,000 Units by mouth daily.      . Coenzyme Q10 (COQ10) 100 MG CAPS Take 100 mg by mouth daily.       Marland Kitchen donepezil (ARICEPT) 10 MG tablet Take 1 tablet (10 mg total) by mouth at bedtime.  90 tablet  1  . isosorbide mononitrate (IMDUR) 30 MG 24 hr tablet Take 30 mg by mouth daily. Change to one daily.      Marland Kitchen losartan (COZAAR) 100 MG tablet Take 100 mg by mouth daily.      . metoprolol tartrate (LOPRESSOR) 25 MG tablet Take 25 mg by mouth 2 (two) times daily.      . Multiple Vitamins-Minerals (CENTRUM SILVER PO) Take 1 tablet by mouth daily.       Marland Kitchen NITROSTAT 0.4 MG SL tablet DISSOLVE 1 TABLET UNDER TONGUE AS NEEDED FOR CHEST PAIN,MAY REPEAT IN5 MINUTES FOR 2 DOSES.  25 tablet  0  . omeprazole (PRILOSEC) 20 MG capsule Take 20 mg by mouth daily.       No current facility-administered medications for this visit.     Allergies:   Augmentin; Penicillins; and Sulfa antibiotics   Social History:  The patient  reports that he has never smoked. He has never used smokeless tobacco. He reports that he drinks about .6 ounces of alcohol per week. He reports that he does not use illicit drugs.   Family History:  The patient's family history includes Cancer in his daughter; Cancer - Lung in his brother; Diabetes in his father and mother; Gait disorder in his brother; Stroke in his sister. There is no history of Heart attack.   ROS:  Please see the history of present illness.   He notes AM dizziness described as a spinning sensation.  He has a hx of BPPV.  Denies any melena, hematochezia, hematuria.   All other systems reviewed and negative.    PHYSICAL EXAM: VS:  BP 148/72  Pulse 56  Ht 5\' 10"  (1.778 m)  Wt 192 lb (87.091 kg)  BMI 27.55 kg/m2 Well nourished, well developed, in no acute distress HEENT: normal Neck: no JVD Cardiac:  normal S1, S2; RRR; no murmur Lungs:   clear to auscultation bilaterally, no wheezing, rhonchi or rales Abd: soft, nontender, no hepatomegaly Ext: right wrist without hematoma or mass,  no edema Skin: warm and dry Neuro:  CNs  2-12 intact, no focal abnormalities noted  EKG:  Sinus brady, HR 56, PAC   No ST changes   ASSESSMENT AND PLAN:  1.  Coronary Artery Disease:  Recent cardiac cath with patent CFX stent and high grade disease in the D1, OM5, PDA.  Medical Rx was recommended.  He denies any further chest pain.  Continue amlodipine, nitrates, statin, beta blocker.  He is not on ASA as he is on Eliquis.  Continue maintenance program at [facility]. 2.  Paroxysmal atrial fibrillation:  Maintaining NSR.  He is tolerating Eliquis. 3.  Essential hypertension:  Fair control.  Continue current Rx. 4.  HLD (hyperlipidemia):  Managed by PCP.  Continue statin.  5.  Sleep apnea with use of continuous positive airway pressure (CPAP):  He is followed by Neurology (Dr. Brett Fairy).   6.  Dizziness:  He has symptoms that sound consistent with BPPV.  He has had this for years without significant change.  FU with PCP to consider referral to vestibular rehab.    Disposition:   FU with Dr. Daneen Schick in 6 mos.    Signed, Versie Starks, MHS 07/05/2014 12:55 PM    [address], [place], [place]  [id number] Phone: [phone]; Fax: [phone]

## 2014-07-05 NOTE — Patient Instructions (Signed)
Your physician wants you to follow-up in:  6 months with Dr. Tamala Julian. You will receive a reminder letter in the mail two months in advance. If you don't receive a letter, please call our office to schedule the follow-up appointment.

## 2014-07-07 ENCOUNTER — Encounter (HOSPITAL_COMMUNITY): Payer: Self-pay

## 2014-07-10 ENCOUNTER — Encounter (HOSPITAL_COMMUNITY): Payer: Self-pay

## 2014-07-10 DIAGNOSIS — I2510 Atherosclerotic heart disease of native coronary artery without angina pectoris: Secondary | ICD-10-CM | POA: Insufficient documentation

## 2014-07-10 DIAGNOSIS — Z5189 Encounter for other specified aftercare: Secondary | ICD-10-CM | POA: Insufficient documentation

## 2014-07-10 DIAGNOSIS — I252 Old myocardial infarction: Secondary | ICD-10-CM | POA: Insufficient documentation

## 2014-07-10 DIAGNOSIS — I4891 Unspecified atrial fibrillation: Secondary | ICD-10-CM | POA: Insufficient documentation

## 2014-07-10 DIAGNOSIS — I499 Cardiac arrhythmia, unspecified: Secondary | ICD-10-CM | POA: Insufficient documentation

## 2014-07-10 DIAGNOSIS — R079 Chest pain, unspecified: Secondary | ICD-10-CM | POA: Insufficient documentation

## 2014-07-12 ENCOUNTER — Encounter (HOSPITAL_COMMUNITY): Payer: Medicare Other

## 2014-07-14 ENCOUNTER — Encounter (HOSPITAL_COMMUNITY): Payer: Self-pay

## 2014-07-17 ENCOUNTER — Encounter (HOSPITAL_COMMUNITY): Payer: Self-pay

## 2014-07-19 ENCOUNTER — Encounter (HOSPITAL_COMMUNITY): Payer: Self-pay

## 2014-07-21 ENCOUNTER — Encounter (HOSPITAL_COMMUNITY): Payer: Self-pay

## 2014-07-24 ENCOUNTER — Encounter (HOSPITAL_COMMUNITY)
Admission: RE | Admit: 2014-07-24 | Discharge: 2014-07-24 | Disposition: A | Discharge status: Home / Self Care | Payer: Self-pay | Source: Ambulatory Visit | Attending: Interventional Cardiology | Admitting: Interventional Cardiology

## 2014-07-26 ENCOUNTER — Encounter (HOSPITAL_COMMUNITY): Payer: Self-pay

## 2014-07-26 ENCOUNTER — Encounter: Payer: Self-pay | Admitting: Neurology

## 2014-07-28 ENCOUNTER — Encounter (HOSPITAL_COMMUNITY): Payer: Self-pay

## 2014-07-29 ENCOUNTER — Other Ambulatory Visit: Payer: Self-pay | Admitting: Interventional Cardiology

## 2014-07-31 ENCOUNTER — Encounter (HOSPITAL_COMMUNITY)
Admission: RE | Admit: 2014-07-31 | Discharge: 2014-07-31 | Disposition: A | Discharge status: Home / Self Care | Payer: Self-pay | Source: Ambulatory Visit | Attending: Interventional Cardiology | Admitting: Interventional Cardiology

## 2014-08-01 ENCOUNTER — Encounter: Payer: Self-pay | Admitting: Neurology

## 2014-08-02 ENCOUNTER — Encounter (HOSPITAL_COMMUNITY): Payer: Self-pay

## 2014-08-07 ENCOUNTER — Encounter (HOSPITAL_COMMUNITY): Payer: Self-pay

## 2014-08-09 ENCOUNTER — Encounter (HOSPITAL_COMMUNITY)
Admission: RE | Admit: 2014-08-09 | Discharge: 2014-08-09 | Disposition: A | Discharge status: Home / Self Care | Payer: Self-pay | Source: Ambulatory Visit | Attending: Interventional Cardiology | Admitting: Interventional Cardiology

## 2014-08-09 DIAGNOSIS — I2510 Atherosclerotic heart disease of native coronary artery without angina pectoris: Secondary | ICD-10-CM | POA: Insufficient documentation

## 2014-08-09 DIAGNOSIS — I499 Cardiac arrhythmia, unspecified: Secondary | ICD-10-CM | POA: Insufficient documentation

## 2014-08-09 DIAGNOSIS — R079 Chest pain, unspecified: Secondary | ICD-10-CM | POA: Insufficient documentation

## 2014-08-09 DIAGNOSIS — Z5189 Encounter for other specified aftercare: Secondary | ICD-10-CM | POA: Insufficient documentation

## 2014-08-09 DIAGNOSIS — I4891 Unspecified atrial fibrillation: Secondary | ICD-10-CM | POA: Insufficient documentation

## 2014-08-09 DIAGNOSIS — I252 Old myocardial infarction: Secondary | ICD-10-CM | POA: Insufficient documentation

## 2014-08-11 ENCOUNTER — Encounter (HOSPITAL_COMMUNITY): Payer: Self-pay

## 2014-08-14 ENCOUNTER — Encounter (HOSPITAL_COMMUNITY): Payer: Self-pay

## 2014-08-16 ENCOUNTER — Encounter (HOSPITAL_COMMUNITY)
Admission: RE | Admit: 2014-08-16 | Discharge: 2014-08-16 | Disposition: A | Discharge status: Home / Self Care | Payer: Self-pay | Source: Ambulatory Visit | Attending: Interventional Cardiology | Admitting: Interventional Cardiology

## 2014-08-17 ENCOUNTER — Encounter (HOSPITAL_COMMUNITY): Payer: Self-pay | Admitting: Interventional Cardiology

## 2014-08-18 ENCOUNTER — Encounter (HOSPITAL_COMMUNITY): Payer: Self-pay

## 2014-08-21 ENCOUNTER — Encounter (HOSPITAL_COMMUNITY)
Admission: RE | Admit: 2014-08-21 | Discharge: 2014-08-21 | Disposition: A | Discharge status: Home / Self Care | Payer: Self-pay | Source: Ambulatory Visit | Attending: Interventional Cardiology | Admitting: Interventional Cardiology

## 2014-08-23 ENCOUNTER — Encounter (HOSPITAL_COMMUNITY): Payer: Self-pay

## 2014-08-25 ENCOUNTER — Encounter (HOSPITAL_COMMUNITY)
Admission: RE | Admit: 2014-08-25 | Discharge: 2014-08-25 | Disposition: A | Discharge status: Home / Self Care | Payer: Self-pay | Source: Ambulatory Visit | Attending: Interventional Cardiology | Admitting: Interventional Cardiology

## 2014-08-28 ENCOUNTER — Encounter (HOSPITAL_COMMUNITY): Payer: Self-pay

## 2014-08-29 ENCOUNTER — Telehealth: Payer: Self-pay | Admitting: Physician Assistant

## 2014-08-29 NOTE — Telephone Encounter (Signed)
Walk in pt form " handicapped form" dropped off will hold for lisa return

## 2014-08-30 ENCOUNTER — Encounter (HOSPITAL_COMMUNITY): Admission: RE | Admit: 2014-08-30 | Payer: Self-pay | Source: Ambulatory Visit

## 2014-09-04 ENCOUNTER — Encounter (HOSPITAL_COMMUNITY): Payer: Self-pay

## 2014-09-06 ENCOUNTER — Encounter (HOSPITAL_COMMUNITY): Payer: Self-pay

## 2014-09-06 ENCOUNTER — Telehealth: Payer: Self-pay | Admitting: Interventional Cardiology

## 2014-09-06 NOTE — Telephone Encounter (Signed)
Returned pt call adv him that Dr.Smith will return to the office on 1/4. I will call him when handicap form is completed and ready for pick up. He verbalized understanding.

## 2014-09-06 NOTE — Telephone Encounter (Signed)
New message      Checking on the status of the handicapp parking form left at our office about 1wk ago.  Pt thought Richardson Dopp was going to sign it.  Please call

## 2014-09-08 ENCOUNTER — Encounter (HOSPITAL_COMMUNITY): Payer: Self-pay

## 2014-09-11 ENCOUNTER — Encounter (HOSPITAL_COMMUNITY): Payer: Self-pay | Attending: Interventional Cardiology

## 2014-09-11 DIAGNOSIS — Z5189 Encounter for other specified aftercare: Secondary | ICD-10-CM | POA: Insufficient documentation

## 2014-09-11 DIAGNOSIS — I499 Cardiac arrhythmia, unspecified: Secondary | ICD-10-CM | POA: Insufficient documentation

## 2014-09-11 DIAGNOSIS — I252 Old myocardial infarction: Secondary | ICD-10-CM | POA: Insufficient documentation

## 2014-09-11 DIAGNOSIS — I4891 Unspecified atrial fibrillation: Secondary | ICD-10-CM | POA: Insufficient documentation

## 2014-09-11 DIAGNOSIS — I2510 Atherosclerotic heart disease of native coronary artery without angina pectoris: Secondary | ICD-10-CM | POA: Insufficient documentation

## 2014-09-11 DIAGNOSIS — R079 Chest pain, unspecified: Secondary | ICD-10-CM | POA: Insufficient documentation

## 2014-09-12 NOTE — Telephone Encounter (Signed)
Completed handicap form left at the front des for pick up

## 2014-09-12 NOTE — Telephone Encounter (Signed)
lmom handicap form left at front desk for pick up

## 2014-09-13 ENCOUNTER — Encounter (HOSPITAL_COMMUNITY): Payer: Self-pay

## 2014-09-15 ENCOUNTER — Encounter (HOSPITAL_COMMUNITY): Payer: Self-pay

## 2014-09-18 ENCOUNTER — Encounter (HOSPITAL_COMMUNITY): Payer: Self-pay

## 2014-09-20 ENCOUNTER — Encounter (HOSPITAL_COMMUNITY): Payer: Self-pay

## 2014-09-20 ENCOUNTER — Other Ambulatory Visit: Payer: Self-pay | Admitting: Interventional Cardiology

## 2014-09-22 ENCOUNTER — Encounter (HOSPITAL_COMMUNITY): Payer: Self-pay

## 2014-09-25 ENCOUNTER — Encounter (HOSPITAL_COMMUNITY): Payer: Self-pay

## 2014-09-27 ENCOUNTER — Encounter (HOSPITAL_COMMUNITY): Payer: Self-pay

## 2014-09-29 ENCOUNTER — Encounter (HOSPITAL_COMMUNITY): Payer: Self-pay

## 2014-10-02 ENCOUNTER — Ambulatory Visit: Payer: Medicare Other | Admitting: Neurology

## 2014-10-02 ENCOUNTER — Encounter (HOSPITAL_COMMUNITY): Payer: Self-pay

## 2014-10-02 ENCOUNTER — Telehealth (HOSPITAL_COMMUNITY): Payer: Self-pay | Admitting: *Deleted

## 2014-10-04 ENCOUNTER — Encounter (HOSPITAL_COMMUNITY): Payer: Self-pay

## 2014-10-06 ENCOUNTER — Encounter (HOSPITAL_COMMUNITY): Admission: RE | Admit: 2014-10-06 | Payer: Self-pay | Source: Ambulatory Visit

## 2014-10-09 ENCOUNTER — Encounter (HOSPITAL_COMMUNITY): Payer: Medicare Other

## 2014-10-09 DIAGNOSIS — I252 Old myocardial infarction: Secondary | ICD-10-CM | POA: Insufficient documentation

## 2014-10-09 DIAGNOSIS — R079 Chest pain, unspecified: Secondary | ICD-10-CM | POA: Insufficient documentation

## 2014-10-09 DIAGNOSIS — I4891 Unspecified atrial fibrillation: Secondary | ICD-10-CM | POA: Insufficient documentation

## 2014-10-09 DIAGNOSIS — I499 Cardiac arrhythmia, unspecified: Secondary | ICD-10-CM | POA: Insufficient documentation

## 2014-10-09 DIAGNOSIS — I2510 Atherosclerotic heart disease of native coronary artery without angina pectoris: Secondary | ICD-10-CM | POA: Insufficient documentation

## 2014-10-09 DIAGNOSIS — Z5189 Encounter for other specified aftercare: Secondary | ICD-10-CM | POA: Insufficient documentation

## 2014-10-11 ENCOUNTER — Encounter (HOSPITAL_COMMUNITY): Payer: Self-pay

## 2014-10-13 ENCOUNTER — Encounter (HOSPITAL_COMMUNITY): Payer: Self-pay

## 2014-10-16 ENCOUNTER — Encounter (HOSPITAL_COMMUNITY): Payer: Self-pay

## 2014-10-18 ENCOUNTER — Encounter (HOSPITAL_COMMUNITY): Payer: Self-pay

## 2014-10-20 ENCOUNTER — Encounter (HOSPITAL_COMMUNITY): Payer: Self-pay

## 2014-10-23 ENCOUNTER — Encounter (HOSPITAL_COMMUNITY): Payer: Self-pay

## 2014-10-25 ENCOUNTER — Encounter (HOSPITAL_COMMUNITY): Payer: Self-pay

## 2014-10-27 ENCOUNTER — Encounter (HOSPITAL_COMMUNITY): Payer: Self-pay

## 2014-10-30 ENCOUNTER — Encounter (HOSPITAL_COMMUNITY): Payer: Self-pay

## 2014-10-31 ENCOUNTER — Telehealth (HOSPITAL_COMMUNITY): Payer: Self-pay | Admitting: *Deleted

## 2014-11-01 ENCOUNTER — Encounter (HOSPITAL_COMMUNITY): Payer: Self-pay

## 2014-11-03 ENCOUNTER — Encounter (HOSPITAL_COMMUNITY): Payer: Self-pay

## 2014-11-06 ENCOUNTER — Encounter (HOSPITAL_COMMUNITY)
Admission: RE | Admit: 2014-11-06 | Discharge: 2014-11-06 | Disposition: A | Discharge status: Home / Self Care | Payer: Self-pay | Source: Ambulatory Visit | Attending: Interventional Cardiology | Admitting: Interventional Cardiology

## 2014-11-08 ENCOUNTER — Encounter (HOSPITAL_COMMUNITY)
Admission: RE | Admit: 2014-11-08 | Discharge: 2014-11-08 | Disposition: A | Discharge status: Home / Self Care | Payer: Self-pay | Source: Ambulatory Visit | Attending: Interventional Cardiology | Admitting: Interventional Cardiology

## 2014-11-08 DIAGNOSIS — I4891 Unspecified atrial fibrillation: Secondary | ICD-10-CM | POA: Insufficient documentation

## 2014-11-08 DIAGNOSIS — I2510 Atherosclerotic heart disease of native coronary artery without angina pectoris: Secondary | ICD-10-CM | POA: Insufficient documentation

## 2014-11-08 DIAGNOSIS — I499 Cardiac arrhythmia, unspecified: Secondary | ICD-10-CM | POA: Insufficient documentation

## 2014-11-08 DIAGNOSIS — R079 Chest pain, unspecified: Secondary | ICD-10-CM | POA: Insufficient documentation

## 2014-11-08 DIAGNOSIS — I252 Old myocardial infarction: Secondary | ICD-10-CM | POA: Insufficient documentation

## 2014-11-08 DIAGNOSIS — Z5189 Encounter for other specified aftercare: Secondary | ICD-10-CM | POA: Insufficient documentation

## 2014-11-10 ENCOUNTER — Encounter (HOSPITAL_COMMUNITY): Payer: Self-pay

## 2014-11-13 ENCOUNTER — Encounter (HOSPITAL_COMMUNITY): Payer: Self-pay

## 2014-11-15 ENCOUNTER — Encounter (HOSPITAL_COMMUNITY): Payer: Medicare Other

## 2014-11-17 ENCOUNTER — Encounter (HOSPITAL_COMMUNITY): Payer: Self-pay

## 2014-11-20 ENCOUNTER — Encounter (HOSPITAL_COMMUNITY): Payer: Self-pay

## 2014-11-22 ENCOUNTER — Encounter (HOSPITAL_COMMUNITY): Payer: Self-pay

## 2014-11-24 ENCOUNTER — Encounter (HOSPITAL_COMMUNITY)
Admission: RE | Admit: 2014-11-24 | Discharge: 2014-11-24 | Disposition: A | Discharge status: Home / Self Care | Payer: Self-pay | Source: Ambulatory Visit | Attending: Interventional Cardiology | Admitting: Interventional Cardiology

## 2014-11-26 ENCOUNTER — Other Ambulatory Visit: Payer: Self-pay | Admitting: Interventional Cardiology

## 2014-11-27 ENCOUNTER — Encounter (HOSPITAL_COMMUNITY): Payer: Self-pay

## 2014-11-27 NOTE — Telephone Encounter (Signed)
Pcp changed dose to 25mg  bid. Ok to refill with this sig or should it be deferred to pcp? Please advise. Thanks, MI

## 2014-11-29 ENCOUNTER — Encounter (HOSPITAL_COMMUNITY): Payer: Self-pay

## 2014-12-01 ENCOUNTER — Encounter (HOSPITAL_COMMUNITY)
Admission: RE | Admit: 2014-12-01 | Discharge: 2014-12-01 | Disposition: A | Discharge status: Home / Self Care | Payer: Self-pay | Source: Ambulatory Visit | Attending: Interventional Cardiology | Admitting: Interventional Cardiology

## 2014-12-04 ENCOUNTER — Encounter (HOSPITAL_COMMUNITY): Payer: Self-pay

## 2014-12-06 ENCOUNTER — Encounter (HOSPITAL_COMMUNITY)
Admission: RE | Admit: 2014-12-06 | Discharge: 2014-12-06 | Disposition: A | Discharge status: Home / Self Care | Payer: Self-pay | Source: Ambulatory Visit | Attending: Interventional Cardiology | Admitting: Interventional Cardiology

## 2014-12-08 ENCOUNTER — Encounter (HOSPITAL_COMMUNITY)
Admission: RE | Admit: 2014-12-08 | Discharge: 2014-12-08 | Disposition: A | Discharge status: Home / Self Care | Payer: Self-pay | Source: Ambulatory Visit | Attending: Interventional Cardiology | Admitting: Interventional Cardiology

## 2014-12-08 DIAGNOSIS — I499 Cardiac arrhythmia, unspecified: Secondary | ICD-10-CM | POA: Insufficient documentation

## 2014-12-08 DIAGNOSIS — I2510 Atherosclerotic heart disease of native coronary artery without angina pectoris: Secondary | ICD-10-CM | POA: Insufficient documentation

## 2014-12-08 DIAGNOSIS — Z5189 Encounter for other specified aftercare: Secondary | ICD-10-CM | POA: Insufficient documentation

## 2014-12-08 DIAGNOSIS — I252 Old myocardial infarction: Secondary | ICD-10-CM | POA: Insufficient documentation

## 2014-12-08 DIAGNOSIS — R079 Chest pain, unspecified: Secondary | ICD-10-CM | POA: Insufficient documentation

## 2014-12-08 DIAGNOSIS — I4891 Unspecified atrial fibrillation: Secondary | ICD-10-CM | POA: Insufficient documentation

## 2014-12-11 ENCOUNTER — Other Ambulatory Visit: Payer: Self-pay

## 2014-12-11 ENCOUNTER — Observation Stay (HOSPITAL_COMMUNITY)
Admission: EM | Admit: 2014-12-11 | Discharge: 2014-12-13 | Disposition: A | Discharge status: Home / Self Care | Payer: Medicare Other | Attending: Internal Medicine | Admitting: Internal Medicine

## 2014-12-11 ENCOUNTER — Encounter (HOSPITAL_COMMUNITY)
Admission: RE | Admit: 2014-12-11 | Discharge: 2014-12-11 | Disposition: A | Discharge status: Home / Self Care | Payer: Self-pay | Source: Ambulatory Visit | Attending: Interventional Cardiology | Admitting: Interventional Cardiology

## 2014-12-11 ENCOUNTER — Encounter (HOSPITAL_COMMUNITY): Payer: Self-pay | Admitting: *Deleted

## 2014-12-11 ENCOUNTER — Telehealth: Payer: Self-pay | Admitting: Interventional Cardiology

## 2014-12-11 ENCOUNTER — Telehealth (HOSPITAL_COMMUNITY): Payer: Self-pay | Admitting: Cardiac Rehabilitation

## 2014-12-11 ENCOUNTER — Other Ambulatory Visit (HOSPITAL_COMMUNITY): Payer: Self-pay

## 2014-12-11 DIAGNOSIS — R55 Syncope and collapse: Secondary | ICD-10-CM | POA: Diagnosis not present

## 2014-12-11 DIAGNOSIS — F0390 Unspecified dementia without behavioral disturbance: Secondary | ICD-10-CM | POA: Diagnosis not present

## 2014-12-11 DIAGNOSIS — R42 Dizziness and giddiness: Secondary | ICD-10-CM

## 2014-12-11 DIAGNOSIS — Z8673 Personal history of transient ischemic attack (TIA), and cerebral infarction without residual deficits: Secondary | ICD-10-CM | POA: Diagnosis not present

## 2014-12-11 DIAGNOSIS — I4891 Unspecified atrial fibrillation: Secondary | ICD-10-CM | POA: Diagnosis present

## 2014-12-11 DIAGNOSIS — N179 Acute kidney failure, unspecified: Secondary | ICD-10-CM | POA: Diagnosis present

## 2014-12-11 DIAGNOSIS — I252 Old myocardial infarction: Secondary | ICD-10-CM | POA: Insufficient documentation

## 2014-12-11 DIAGNOSIS — R32 Unspecified urinary incontinence: Secondary | ICD-10-CM | POA: Diagnosis not present

## 2014-12-11 DIAGNOSIS — Z7982 Long term (current) use of aspirin: Secondary | ICD-10-CM | POA: Insufficient documentation

## 2014-12-11 DIAGNOSIS — Z7901 Long term (current) use of anticoagulants: Secondary | ICD-10-CM | POA: Diagnosis not present

## 2014-12-11 DIAGNOSIS — I480 Paroxysmal atrial fibrillation: Secondary | ICD-10-CM | POA: Diagnosis not present

## 2014-12-11 DIAGNOSIS — Z833 Family history of diabetes mellitus: Secondary | ICD-10-CM | POA: Insufficient documentation

## 2014-12-11 DIAGNOSIS — I10 Essential (primary) hypertension: Secondary | ICD-10-CM | POA: Insufficient documentation

## 2014-12-11 DIAGNOSIS — Z955 Presence of coronary angioplasty implant and graft: Secondary | ICD-10-CM | POA: Insufficient documentation

## 2014-12-11 DIAGNOSIS — Z882 Allergy status to sulfonamides status: Secondary | ICD-10-CM | POA: Insufficient documentation

## 2014-12-11 DIAGNOSIS — I2510 Atherosclerotic heart disease of native coronary artery without angina pectoris: Secondary | ICD-10-CM | POA: Insufficient documentation

## 2014-12-11 DIAGNOSIS — Z79899 Other long term (current) drug therapy: Secondary | ICD-10-CM | POA: Insufficient documentation

## 2014-12-11 DIAGNOSIS — Z881 Allergy status to other antibiotic agents status: Secondary | ICD-10-CM | POA: Insufficient documentation

## 2014-12-11 DIAGNOSIS — Z880 Allergy status to penicillin: Secondary | ICD-10-CM | POA: Insufficient documentation

## 2014-12-11 HISTORY — DX: Unspecified dementia, unspecified severity, without behavioral disturbance, psychotic disturbance, mood disturbance, and anxiety: F03.90

## 2014-12-11 HISTORY — DX: Acute myocardial infarction, unspecified: I21.9

## 2014-12-11 LAB — URINALYSIS, ROUTINE W REFLEX MICROSCOPIC
Bilirubin Urine: NEGATIVE
GLUCOSE, UA: NEGATIVE mg/dL
Hgb urine dipstick: NEGATIVE
KETONES UR: NEGATIVE mg/dL
LEUKOCYTES UA: NEGATIVE
NITRITE: NEGATIVE
PROTEIN: 100 mg/dL — AB
Specific Gravity, Urine: 1.02 (ref 1.005–1.030)
Urobilinogen, UA: 0.2 mg/dL (ref 0.0–1.0)
pH: 5 (ref 5.0–8.0)

## 2014-12-11 LAB — CBC WITH DIFFERENTIAL/PLATELET
Basophils Absolute: 0.1 10*3/uL (ref 0.0–0.1)
Basophils Relative: 1 % (ref 0–1)
Eosinophils Absolute: 0.5 10*3/uL (ref 0.0–0.7)
Eosinophils Relative: 5 % (ref 0–5)
HEMATOCRIT: 45.9 % (ref 39.0–52.0)
HEMOGLOBIN: 15.6 g/dL (ref 13.0–17.0)
LYMPHS PCT: 31 % (ref 12–46)
Lymphs Abs: 2.7 10*3/uL (ref 0.7–4.0)
MCH: 29.2 pg (ref 26.0–34.0)
MCHC: 34 g/dL (ref 30.0–36.0)
MCV: 85.8 fL (ref 78.0–100.0)
MONO ABS: 0.8 10*3/uL (ref 0.1–1.0)
Monocytes Relative: 10 % (ref 3–12)
Neutro Abs: 4.7 10*3/uL (ref 1.7–7.7)
Neutrophils Relative %: 53 % (ref 43–77)
Platelets: 206 10*3/uL (ref 150–400)
RBC: 5.35 MIL/uL (ref 4.22–5.81)
RDW: 14.2 % (ref 11.5–15.5)
WBC: 8.9 10*3/uL (ref 4.0–10.5)

## 2014-12-11 LAB — URINE MICROSCOPIC-ADD ON

## 2014-12-11 LAB — I-STAT TROPONIN, ED: Troponin i, poc: 0 ng/mL (ref 0.00–0.08)

## 2014-12-11 LAB — TSH: TSH: 0.902 u[IU]/mL (ref 0.350–4.500)

## 2014-12-11 LAB — MAGNESIUM: Magnesium: 1.9 mg/dL (ref 1.5–2.5)

## 2014-12-11 LAB — BASIC METABOLIC PANEL
ANION GAP: 13 (ref 5–15)
BUN: 22 mg/dL (ref 6–23)
CALCIUM: 9.7 mg/dL (ref 8.4–10.5)
CO2: 21 mmol/L (ref 19–32)
Chloride: 106 mmol/L (ref 96–112)
Creatinine, Ser: 1.75 mg/dL — ABNORMAL HIGH (ref 0.50–1.35)
GFR calc Af Amer: 39 mL/min — ABNORMAL LOW (ref >90)
GFR, EST NON AFRICAN AMERICAN: 34 mL/min — AB (ref >90)
Glucose, Bld: 119 mg/dL — ABNORMAL HIGH (ref 70–99)
POTASSIUM: 4.3 mmol/L (ref 3.5–5.1)
SODIUM: 140 mmol/L (ref 135–145)

## 2014-12-11 LAB — CBG MONITORING, ED: Glucose-Capillary: 112 mg/dL — ABNORMAL HIGH (ref 70–99)

## 2014-12-11 LAB — PROTIME-INR
INR: 1.16 (ref 0.00–1.49)
INR: 1.17 (ref 0.00–1.49)
PROTHROMBIN TIME: 15 s (ref 11.6–15.2)
Prothrombin Time: 15.1 seconds (ref 11.6–15.2)

## 2014-12-11 LAB — TROPONIN I: Troponin I: <0.03 ng/mL (ref <0.031)

## 2014-12-11 MED ORDER — ONDANSETRON HCL 4 MG PO TABS: 4.0000 mg | ORAL_TABLET | Freq: Four times a day (QID) | ORAL | Status: DC | PRN

## 2014-12-11 MED ORDER — SODIUM CHLORIDE 0.9 % IJ SOLN: 3.0000 mL | Freq: Two times a day (BID) | INTRAMUSCULAR | Status: DC

## 2014-12-11 MED ORDER — NITROGLYCERIN 0.3 MG SL SUBL
0.3000 mg | SUBLINGUAL_TABLET | SUBLINGUAL | Status: DC | PRN
  Filled 2014-12-11: qty 100

## 2014-12-11 MED ORDER — HEPARIN SODIUM (PORCINE) 5000 UNIT/ML IJ SOLN: 5000.0000 [IU] | Freq: Three times a day (TID) | INTRAMUSCULAR | Status: DC

## 2014-12-11 MED ORDER — SODIUM CHLORIDE 0.9 % IV SOLN
INTRAVENOUS | Status: DC
  Administered 2014-12-11 – 2014-12-12 (×3): via INTRAVENOUS

## 2014-12-11 MED ORDER — ACETAMINOPHEN 650 MG RE SUPP: 650.0000 mg | Freq: Four times a day (QID) | RECTAL | Status: DC | PRN

## 2014-12-11 MED ORDER — DONEPEZIL HCL 10 MG PO TABS
10.0000 mg | ORAL_TABLET | Freq: Every day | ORAL | Status: DC
  Administered 2014-12-11 – 2014-12-12 (×2): 10 mg via ORAL
  Filled 2014-12-11 (×3): qty 1

## 2014-12-11 MED ORDER — VITAMIN D3 25 MCG (1000 UNIT) PO TABS
1000.0000 [IU] | ORAL_TABLET | Freq: Every day | ORAL | Status: DC
  Administered 2014-12-12 – 2014-12-13 (×2): 1000 [IU] via ORAL
  Filled 2014-12-11 (×2): qty 1

## 2014-12-11 MED ORDER — APIXABAN 2.5 MG PO TABS
2.5000 mg | ORAL_TABLET | Freq: Two times a day (BID) | ORAL | Status: DC
  Administered 2014-12-11 – 2014-12-13 (×4): 2.5 mg via ORAL
  Filled 2014-12-11 (×5): qty 1

## 2014-12-11 MED ORDER — NITROGLYCERIN 0.4 MG SL SUBL: 0.4000 mg | SUBLINGUAL_TABLET | SUBLINGUAL | Status: DC | PRN

## 2014-12-11 MED ORDER — MORPHINE SULFATE 2 MG/ML IJ SOLN: 1.0000 mg | INTRAMUSCULAR | Status: DC | PRN

## 2014-12-11 MED ORDER — ACETAMINOPHEN 325 MG PO TABS: 650.0000 mg | ORAL_TABLET | Freq: Four times a day (QID) | ORAL | Status: DC | PRN

## 2014-12-11 MED ORDER — METOPROLOL TARTRATE 12.5 MG HALF TABLET
12.5000 mg | ORAL_TABLET | Freq: Two times a day (BID) | ORAL | Status: DC
  Administered 2014-12-11 – 2014-12-12 (×3): 12.5 mg via ORAL
  Filled 2014-12-11 (×3): qty 1

## 2014-12-11 MED ORDER — ASPIRIN EC 81 MG PO TBEC
81.0000 mg | DELAYED_RELEASE_TABLET | Freq: Every day | ORAL | Status: DC
  Administered 2014-12-12 – 2014-12-13 (×2): 81 mg via ORAL
  Filled 2014-12-11 (×2): qty 1

## 2014-12-11 MED ORDER — HYDROCODONE-ACETAMINOPHEN 5-325 MG PO TABS: 1.0000 | ORAL_TABLET | ORAL | Status: DC | PRN

## 2014-12-11 MED ORDER — PANTOPRAZOLE SODIUM 40 MG PO TBEC
40.0000 mg | DELAYED_RELEASE_TABLET | Freq: Every day | ORAL | Status: DC
  Administered 2014-12-12 – 2014-12-13 (×2): 40 mg via ORAL
  Filled 2014-12-11 (×2): qty 1

## 2014-12-11 MED ORDER — ONDANSETRON HCL 4 MG/2ML IJ SOLN: 4.0000 mg | Freq: Four times a day (QID) | INTRAMUSCULAR | Status: DC | PRN

## 2014-12-11 NOTE — Progress Notes (Deleted)
ANTICOAGULATION CONSULT NOTE - Initial Consult  Pharmacy Consult for Eliquis Indication: atrial fibrillation  Allergies  Allergen Reactions  . Augmentin [Amoxicillin-Pot Clavulanate] Rash  . Penicillins Rash  . Sulfa Antibiotics Rash    Patient Measurements: Weight: 189 lb 13.1 oz (86.101 kg)  Vital Signs: Temp: 97.5 F (36.4 C) (04/04 1128) Temp Source: Oral (04/04 1128) BP: 122/73 mmHg (04/04 1500) Pulse Rate: 63 (04/04 1500)  Labs:  Recent Labs  12/11/14 1132  HGB 15.6  HCT 45.9  PLT 206  LABPROT 15.0  INR 1.16  CREATININE 1.75*    Estimated Creatinine Clearance: 31.9 mL/min (by C-G formula based on Cr of 1.75).   Medical History: Past Medical History  Diagnosis Date  . CVA (cerebral vascular accident)   . Broken shoulder   . Cerebrovascular disease     MRI/MRA in 2007 - 50% stenosis of supraclinoid ICA, left  . Shingles     left chest  . Compression fracture   . Hypertension   . PAF (paroxysmal atrial fibrillation)     hx of NSTEMI in setting of AF with RVR 07/2013;  Eliquis for AC  . Clavicle fracture   . Syncope   . History of pneumonia   . Complex sleep apnea syndrome     adapt SV titration EEP 6 min 6 and max 15 cm water.   Marland Kitchen TIA (transient ischemic attack) 07/12/2013  . Coronary atherosclerosis of native coronary artery 07/12/2013    a. NSTEMI in setting of AF with RVR 11/14 >> BMS to CFX;  b. Nuclear (9/15):  High risk - apical lateral ischemia >> c. LHC (9/15):  pLAD 50%, D1 (smaller br 90%/larger br 80%, pDx 50-60%, dLAD 50%, prox-mid CFX stent ok, pOM1 and OM2 50-70% (jailed by stent), OM5 70-80%, mPDA 90% (CFX dsz unchanged from 2014), RCA diff dsz, EF 55%, mild ant-apical HK >>  Med Rx  . Hx of echocardiogram     a. Echo 11/14): EF 55-60%, no RWMA, grade 2 DD, mild aortic stenosis (mean 15 mmHg), mild LAE  . MI (myocardial infarction) 07/2013    Medications:   (Not in a hospital admission)  Assessment: 50 yoM with a history of A fib on  Eliquis PTA presents to the ED after code blue in cardiac rehab. Pharmacy consulted to dose Eliquis. H/H stable, plts 206, INR 1.16, SCr 1.75   Goal of Therapy:  Monitor platelets by anticoagulation protocol: Yes   Plan:  Eliquis 2.5 mg po BID Monitor for signs of bleeding  Whitney Muse, PharmD Candidate  12/11/2014,3:31 PM

## 2014-12-11 NOTE — Progress Notes (Signed)
(  late entry for 11:10am)  Post exercise at cardiac rehab pt BP-82/67. Pt pale and sluggish, slow response and reaction.  Pt diaphoretic with grogginess.  Pt denies pain, dyspnea or other symptoms. Pt oriented.  Pt seated in chair with feet elevated.  Repeat BP-57/37.  Pulse weak, thready, irregular.   Pt briefly lost consciousness, incontinent of urine, transferred to stretcher, with spontaneous arousal.  zoll applied. Code team responded.  O2-4L nasal cannula applied.  Pt rhythm-atrial fibrillation.  Pt transferred to ED via stretcher.  Pt daughter made aware.  [facility] card master made aware.

## 2014-12-11 NOTE — H&P (Signed)
[facility] History and Physical  [name] IPJ:[id number] DOB: 1927-04-15 DOA: 12/11/2014  Referring physician: EDP PCP: Kandice Hams, MD   Chief Complaint: Syncope and collapse  HPI: Derek [name] is a 81 y.o. male with past medical history of CVA, paroxysmal atrial fibrillation, CAD with history of stent 11/14, patient on both aspirin and low-dose Eliquis. Patient was in cardiac rehabilitation session today and after he finished he developed hypotension, syncope and collapse, CODE BLUE was called but patient regained consciousness spontaneously, per reports he was incontinent of urine. Brought to the hospital for further evaluation. In ED patient was given IV fluids, he is conversant, blood pressure normal and he thinks he is back to his baseline. Patient will be observed in the hospital for further evaluation. There is no mentioned how fast with his heart rate, patient is not on any AV nodal slowing medication.  Review of Systems:  Constitutional: negative for anorexia, fevers and sweats Eyes: negative for irritation, redness and visual disturbance Ears, nose, mouth, throat, and face: negative for earaches, epistaxis, nasal congestion and sore throat Respiratory: negative for cough, dyspnea on exertion, sputum and wheezing Cardiovascular: Syncope, per history of present illness Gastrointestinal: negative for abdominal pain, constipation, diarrhea, melena, nausea and vomiting Genitourinary:negative for dysuria, frequency and hematuria Hematologic/lymphatic: negative for bleeding, easy bruising and lymphadenopathy Musculoskeletal:negative for arthralgias, muscle weakness and stiff joints Neurological: negative for coordination problems, gait problems, headaches and weakness Endocrine: negative for diabetic symptoms including polydipsia, polyuria and weight loss Allergic/Immunologic: negative for anaphylaxis, hay fever and urticaria  Past Medical History  Diagnosis Date   . CVA (cerebral vascular accident)   . Broken shoulder   . Cerebrovascular disease     MRI/MRA in 2007 - 50% stenosis of supraclinoid ICA, left  . Shingles     left chest  . Compression fracture   . Hypertension   . PAF (paroxysmal atrial fibrillation)     hx of NSTEMI in setting of AF with RVR 07/2013;  Eliquis for AC  . Clavicle fracture   . Syncope   . History of pneumonia   . Complex sleep apnea syndrome     adapt SV titration EEP 6 min 6 and max 15 cm water.   Marland Kitchen TIA (transient ischemic attack) 07/12/2013  . Coronary atherosclerosis of native coronary artery 07/12/2013    a. NSTEMI in setting of AF with RVR 11/14 >> BMS to CFX;  b. Nuclear (9/15):  High risk - apical lateral ischemia >> c. LHC (9/15):  pLAD 50%, D1 (smaller br 90%/larger br 80%, pDx 50-60%, dLAD 50%, prox-mid CFX stent ok, pOM1 and OM2 50-70% (jailed by stent), OM5 70-80%, mPDA 90% (CFX dsz unchanged from 2014), RCA diff dsz, EF 55%, mild ant-apical HK >>  Med Rx  . Hx of echocardiogram     a. Echo 11/14): EF 55-60%, no RWMA, grade 2 DD, mild aortic stenosis (mean 15 mmHg), mild LAE  . MI (myocardial infarction) 07/2013   Past Surgical History  Procedure Laterality Date  . Tonsillectomy    . Cataract extraction Bilateral   . Kyphoplasty    . Shoulder surgery Left   . Left heart catheterization with coronary angiogram N/A 08/08/2013    Procedure: LEFT HEART CATHETERIZATION WITH CORONARY ANGIOGRAM;  Surgeon: Jettie Booze, MD;  Location: [facility] CATH LAB;  Service: Cardiovascular;  Laterality: N/A;  . Percutaneous stent intervention  08/08/2013    Procedure: PERCUTANEOUS STENT INTERVENTION;  Surgeon: Jettie Booze, MD;  Location: [facility]  CATH LAB;  Service: Cardiovascular;;  . Left heart catheterization with coronary angiogram N/A 05/29/2014    Procedure: LEFT HEART CATHETERIZATION WITH CORONARY ANGIOGRAM;  Surgeon: Sinclair Grooms, MD;  Location: [facility] CATH LAB;  Service: Cardiovascular;  Laterality: N/A;   Social  History:   reports that he has never smoked. He has never used smokeless tobacco. He reports that he drinks about 0.6 oz of alcohol per week. He reports that he does not use illicit drugs.  Allergies  Allergen Reactions  . Augmentin [Amoxicillin-Pot Clavulanate] Rash  . Penicillins Rash  . Sulfa Antibiotics Rash    Family History  Problem Relation Age of Onset  . Diabetes Mother   . Diabetes Father   . Cancer - Lung Brother   . Gait disorder Brother   . Cancer Daughter   . Heart attack Neg Hx   . Stroke Sister      Prior to Admission medications   Medication Sig Start Date End Date Taking? Authorizing Provider  amLODipine (NORVASC) 10 MG tablet Take 10 mg by mouth daily.   Yes Historical Provider, MD  aspirin EC 81 MG tablet Take 81 mg by mouth daily.   Yes Historical Provider, MD  cholecalciferol (VITAMIN D) 1000 UNITS tablet Take 1,000 Units by mouth daily.   Yes Historical Provider, MD  Coenzyme Q10 (COQ10) 100 MG CAPS Take 100 mg by mouth daily.    Yes Historical Provider, MD  donepezil (ARICEPT) 10 MG tablet Take 1 tablet (10 mg total) by mouth at bedtime. 03/30/14  Yes Otilio Jefferson, NP  ELIQUIS 2.5 MG TABS tablet TAKE 1 TABLET TWICE DAILY. 07/31/14  Yes Belva Crome, MD  losartan (COZAAR) 100 MG tablet Take 100 mg by mouth daily.   Yes Historical Provider, MD  Multiple Vitamins-Minerals (CENTRUM SILVER PO) Take 1 tablet by mouth daily.    Yes Historical Provider, MD  NITROSTAT 0.4 MG SL tablet DISSOLVE 1 TABLET UNDER TONGUE AS NEEDED FOR CHEST PAIN,MAY REPEAT IN5 MINUTES FOR 2 DOSES. 06/19/14  Yes Belva Crome, MD  omeprazole (PRILOSEC) 20 MG capsule Take 20 mg by mouth daily.   Yes Historical Provider, MD  atorvastatin (LIPITOR) 40 MG tablet TAKE 1 TABLET ONCE DAILY AT 6 PM. Patient not taking: Reported on 12/11/2014 09/21/14   Belva Crome, MD  metoprolol tartrate (LOPRESSOR) 25 MG tablet Take 1 tablet (25 mg total) by mouth 2 (two) times daily. Patient not taking:  Reported on 12/11/2014 11/27/14   Belva Crome, MD   Physical Exam: Filed Vitals:   12/11/14 1230  BP: 119/56  Pulse: 33  Temp:   Resp: 13   Constitutional: Oriented to person, place, and time. Well-developed and well-nourished. Cooperative.  Head: Normocephalic and atraumatic.  Nose: Nose normal.  Mouth/Throat: Uvula is midline, oropharynx is clear and moist and mucous membranes are normal.  Eyes: Conjunctivae and EOM are normal. Pupils are equal, round, and reactive to light.  Neck: Trachea normal and normal range of motion. Neck supple.  Cardiovascular: Normal rate, regular rhythm, S1 normal, S2 normal, normal heart sounds and intact distal pulses.   Pulmonary/Chest: Effort normal and breath sounds normal.  Abdominal: Soft. Bowel sounds are normal. There is no hepatosplenomegaly. There is no tenderness.  Musculoskeletal: Normal range of motion.  Neurological: Alert and oriented to person, place, and time. Has normal strength. No cranial nerve deficit or sensory deficit.  Skin: Skin is warm, dry and intact.  Psychiatric: Has a normal mood and  affect. Speech is normal and behavior is normal.   Labs on Admission:  Basic Metabolic Panel:  Recent Labs Lab 12/11/14 1132  NA 140  K 4.3  CL 106  CO2 21  GLUCOSE 119*  BUN 22  CREATININE 1.75*  CALCIUM 9.7  MG 1.9   Liver Function Tests: No results for input(s): AST, ALT, ALKPHOS, BILITOT, PROT, ALBUMIN in the last 168 hours. No results for input(s): LIPASE, AMYLASE in the last 168 hours. No results for input(s): AMMONIA in the last 168 hours. CBC:  Recent Labs Lab 12/11/14 1132  WBC 8.9  NEUTROABS 4.7  HGB 15.6  HCT 45.9  MCV 85.8  PLT 206   Cardiac Enzymes: No results for input(s): CKTOTAL, CKMB, CKMBINDEX, TROPONINI in the last 168 hours.  BNP (last 3 results) No results for input(s): BNP in the last 8760 hours.  ProBNP (last 3 results) No results for input(s): PROBNP in the last 8760 hours.  CBG:  Recent  Labs Lab 12/11/14 1202  GLUCAP 112*    Radiological Exams on Admission: No results found.  EKG: Independently reviewed. Atrial fibrillation rate in the 90s  Assessment/Plan Principal Problem:   Syncope Active Problems:   Dementia   Dizziness   Long term current use of anticoagulant therapy   Atrial fibrillation   AKI (acute kidney injury)    Syncope and collapse Patient admitted to the hospital, observation, telemetry. CT of the head was done showed no acute findings. Hold blood pressure medications including amlodipine and losartan, patient presented with low blood pressure. Hydrate with IV fluids. Rule out ACS, cycle 3 sets of cardiac enzymes.  Acute kidney injury Patient appears to have chronic kidney disease even no diagnosis and has medical records. Creatinine baseline around 1.5 from September 2015. Patient presented with creatinine of 1.75, hydrate with IV fluids, check BMP in a.m., hold losartan.  Paroxysmal atrial fibrillation Currently patient is on atrial fibrillation, rate is below 100, asymptomatic when I saw him. Unclear of the syncope secondary to burst of rapid ventricular response or not. Patient is not on antiarrhythmic medicine, add beta blocker to help with CAD and A. fib.  Dementia/history of a stroke Stable, no changes. Patient is on aspirin and Eliquis, continued.  CAD As history of non-STEMI in 11/14 treated with BMS. Recent LHC showed pLAD 50%, D1 (smaller br 90%/larger br 80%, pDx 50-60%, dLAD 50%, prox-mid CFX stent ok, pOM1 and OM2 50-70% (jailed by stent), OM5 70-80%, mPDA 90% (CFX dsz unchanged from 2014), RCA diff dsz, EF 55%, mild ant-apical HK >> and recommended medical management.  Code Status: Full code Family Communication: Plan discussed with the patient Disposition Plan: Telemetry, observation  Time spent: 70 minutes  Shelbey Spindler A, MD [facility] Pager [phone]

## 2014-12-11 NOTE — Telephone Encounter (Signed)
New message      Pt is in AFIB today.  Want a nurse to know.  He was able to exercise today

## 2014-12-11 NOTE — Progress Notes (Signed)
Received report from [facility] in ED

## 2014-12-11 NOTE — ED Notes (Addendum)
Pt brought from cardiac rehab for syncopal episode lasting several seconds.  Pt in rehab for MI bck in 2014.  Hx of intermittent afib.  Today pt was in afib pre-exercise.  Pt walked the track for 20 min and upon finishing staff noticed pt was lethargic, pale and bp was 82/47.  Pt became dusky, unresponsive for several seconds, and bp dropped to 57/37.  Pt was incontinent of urine.  Pt told RN that he took his meds late last night (aroudn 11 pm and then took them this am, not sure when).  Hx of dementia, so unsure of times.

## 2014-12-11 NOTE — Telephone Encounter (Signed)
pc to Dr. Thompson Caul office to report pt in atrial fibrillation today at cardiac rehab. Pt asymptomatic. Pt reports he took eliquis late last night, around 11pm,  awoke today at 3am and has been awake since. Pt denies chest pain, dyspnea, fatigue, or palpitations.  Pt denies missed medication doses. Pt also reports he has upcoming Dr. Tamala Julian appt in a few weeks however no appt scheduled in computer.  LM for Dr Darliss Ridgel' nurse to call to report presence of atrial fibrillation today and to schedule appt.  Pt is due for 6 month f/u.

## 2014-12-11 NOTE — Progress Notes (Signed)
Chaplain responded to referral from [place] after patient was brought to ED after code blue in cardiac rehab. Per nurse patient did not arrest but had a brief pediod of syncope and now in Trauma C.  Pt daughter [name], in route.    12/11/14 1100  Clinical Encounter Type  Visited With Patient  Visit Type Code  Referral From Chaplain  Spiritual Encounters  Spiritual Needs Emotional  Stress Factors  Patient Stress Factors None identified  Jaclynn Major, [place] pager [phone]

## 2014-12-11 NOTE — Telephone Encounter (Signed)
Pt sent to ED from cardiac rehab for syncopal episode and ED was notified of being in Afib at Cardiac Rehab.  Will forward to Dr. Tamala Julian to make him aware.

## 2014-12-11 NOTE — Progress Notes (Signed)
Pt on 3L [place] with spo2 99%. RN aware. RT will continue to monitor.

## 2014-12-11 NOTE — ED Provider Notes (Signed)
CSN: [id number]     Arrival date & time 12/11/14  1125 History   First MD Initiated Contact with Patient 12/11/14 1132     Chief Complaint  Patient presents with  . Loss of Consciousness  . Hypotension     (Consider location/radiation/quality/duration/timing/severity/associated sxs/prior Treatment) HPI  This is an 81 year old male with past medical history coronary artery disease, mild aortic stenosis, who comes in from cardiac rehabilitation after suffering a syncopal episode. The patient was a cardiac rehabilitation prior to exercising he was noted to be in A. fib he exercised after exercise he began feeling generally weak, walked over looked kind of gray to the nurses.  He sat down and then had a syncopal episode, where he became unresponsive for a few seconds without tonic-clonic movements. He then woke up and he was brought to the emergency room. The patient reports no chest pain, feelings of tachycardia, shortness breath prior to or after the syncopal episode.  Past Medical History  Diagnosis Date  . CVA (cerebral vascular accident)   . Broken shoulder   . Cerebrovascular disease     MRI/MRA in 2007 - 50% stenosis of supraclinoid ICA, left  . Shingles     left chest  . Compression fracture   . Hypertension   . PAF (paroxysmal atrial fibrillation)     hx of NSTEMI in setting of AF with RVR 07/2013;  Eliquis for AC  . Clavicle fracture   . Syncope   . History of pneumonia   . Complex sleep apnea syndrome     adapt SV titration EEP 6 min 6 and max 15 cm water.   Marland Kitchen TIA (transient ischemic attack) 07/12/2013  . Coronary atherosclerosis of native coronary artery 07/12/2013    a. NSTEMI in setting of AF with RVR 11/14 >> BMS to CFX;  b. Nuclear (9/15):  High risk - apical lateral ischemia >> c. LHC (9/15):  pLAD 50%, D1 (smaller br 90%/larger br 80%, pDx 50-60%, dLAD 50%, prox-mid CFX stent ok, pOM1 and OM2 50-70% (jailed by stent), OM5 70-80%, mPDA 90% (CFX dsz unchanged from 2014),  RCA diff dsz, EF 55%, mild ant-apical HK >>  Med Rx  . Hx of echocardiogram     a. Echo 11/14): EF 55-60%, no RWMA, grade 2 DD, mild aortic stenosis (mean 15 mmHg), mild LAE   Past Surgical History  Procedure Laterality Date  . Tonsillectomy    . Cataract extraction Bilateral   . Kyphoplasty    . Shoulder surgery Left   . Left heart catheterization with coronary angiogram N/A 08/08/2013    Procedure: LEFT HEART CATHETERIZATION WITH CORONARY ANGIOGRAM;  Surgeon: Jettie Booze, MD;  Location: [facility] CATH LAB;  Service: Cardiovascular;  Laterality: N/A;  . Percutaneous stent intervention  08/08/2013    Procedure: PERCUTANEOUS STENT INTERVENTION;  Surgeon: Jettie Booze, MD;  Location: [facility] CATH LAB;  Service: Cardiovascular;;  . Left heart catheterization with coronary angiogram N/A 05/29/2014    Procedure: LEFT HEART CATHETERIZATION WITH CORONARY ANGIOGRAM;  Surgeon: Sinclair Grooms, MD;  Location: [facility] CATH LAB;  Service: Cardiovascular;  Laterality: N/A;   Family History  Problem Relation Age of Onset  . Diabetes Mother   . Diabetes Father   . Cancer - Lung Brother   . Gait disorder Brother   . Cancer Daughter   . Heart attack Neg Hx   . Stroke Sister    History  Substance Use Topics  . Smoking status: Never Smoker   .  Smokeless tobacco: Never Used  . Alcohol Use: 0.6 oz/week    1 Glasses of wine per week     Comment: occasionally    Review of Systems  Constitutional: Negative for fever and chills.  Eyes: Negative for redness.  Respiratory: Negative for cough and shortness of breath.   Cardiovascular: Negative for chest pain.  Gastrointestinal: Negative for nausea, vomiting, abdominal pain and diarrhea.  Genitourinary: Negative for dysuria.  Skin: Negative for rash.  Neurological: Positive for syncope. Negative for headaches.  All other systems reviewed and are negative.     Allergies  Augmentin; Penicillins; and Sulfa antibiotics  Home Medications   Prior to  Admission medications   Medication Sig Start Date End Date Taking? Authorizing Provider  amLODipine (NORVASC) 10 MG tablet Take 10 mg by mouth daily.    Historical Provider, MD  atorvastatin (LIPITOR) 40 MG tablet TAKE 1 TABLET ONCE DAILY AT 6 PM. 09/21/14   Belva Crome, MD  cholecalciferol (VITAMIN D) 1000 UNITS tablet Take 1,000 Units by mouth daily.    Historical Provider, MD  Coenzyme Q10 (COQ10) 100 MG CAPS Take 100 mg by mouth daily.     Historical Provider, MD  donepezil (ARICEPT) 10 MG tablet Take 1 tablet (10 mg total) by mouth at bedtime. 03/30/14   Otilio Jefferson, NP  ELIQUIS 2.5 MG TABS tablet TAKE 1 TABLET TWICE DAILY. 07/31/14   Belva Crome, MD  isosorbide mononitrate (IMDUR) 30 MG 24 hr tablet Take 30 mg by mouth daily. Change to one daily. 05/29/14   Belva Crome, MD  losartan (COZAAR) 100 MG tablet Take 100 mg by mouth daily.    Historical Provider, MD  metoprolol tartrate (LOPRESSOR) 25 MG tablet Take 1 tablet (25 mg total) by mouth 2 (two) times daily. 11/27/14   Belva Crome, MD  Multiple Vitamins-Minerals (CENTRUM SILVER PO) Take 1 tablet by mouth daily.     Historical Provider, MD  NITROSTAT 0.4 MG SL tablet DISSOLVE 1 TABLET UNDER TONGUE AS NEEDED FOR CHEST PAIN,MAY REPEAT IN5 MINUTES FOR 2 DOSES. 06/19/14   Belva Crome, MD  omeprazole (PRILOSEC) 20 MG capsule Take 20 mg by mouth daily.    Historical Provider, MD   BP 126/65 mmHg  Pulse 56  Temp(Src) 97.5 F (36.4 C) (Oral)  Resp 13  Wt 189 lb 13.1 oz (86.101 kg)  SpO2 100% Physical Exam  Constitutional: He is oriented to person, place, and time. No distress.  HENT:  Head: Normocephalic and atraumatic.  Eyes: EOM are normal. Pupils are equal, round, and reactive to light.  Neck: Normal range of motion. Neck supple.  Cardiovascular: Normal rate.   Pulmonary/Chest: Effort normal. No respiratory distress.  Abdominal: Soft. There is no tenderness.  Musculoskeletal: Normal range of motion.  Neurological: He is  alert and oriented to person, place, and time. He has normal strength. He is not disoriented. No cranial nerve deficit or sensory deficit. Coordination normal.  Skin: No rash noted. He is not diaphoretic.  Psychiatric: He has a normal mood and affect.    ED Course  Procedures (including critical care time) Labs Review Labs Reviewed  CBC WITH DIFFERENTIAL/PLATELET  BASIC METABOLIC PANEL  MAGNESIUM  PROTIME-INR  I-STAT [place], ED    Imaging Review No results found.   EKG Interpretation None      MDM   Final diagnoses:  None      This is an 81 year old male with past medical history coronary artery disease, mild  aortic stenosis, who comes in from cardiac rehabilitation after suffering a syncopal episode.  Back to baseline now.  Patient in afib on ekg.  He denies any sx at this time.  Will obtain basic labs, trop\  Labs WNL.  Trop neg.  Will admit to tele for syncope obs as this gentleman is at high risk for cardiogenic cause of his syncope.  He is currently back to baseline with normal vitals.    Jarome Matin, MD 12/11/14 [place], MD 12/11/14 [phone]

## 2014-12-11 NOTE — Progress Notes (Signed)
Chaplain responded to code blue in cardiac rehab. Pt then transported to Tra C in ED. Pt daughter [name], in route. Chaplain coordinated care with ED chaplain [place]. Page chaplain as needed. [phone].   12/11/14 1100  Clinical Encounter Type  Visited With Health care provider  Visit Type Code  Stress Factors  Patient Stress Factors None identified  Suesan Mohrmann, Barbette Hair, Chaplain 12/11/2014 11:34 AM

## 2014-12-11 NOTE — Telephone Encounter (Signed)
LMTCB

## 2014-12-12 DIAGNOSIS — I480 Paroxysmal atrial fibrillation: Secondary | ICD-10-CM | POA: Diagnosis not present

## 2014-12-12 DIAGNOSIS — N179 Acute kidney failure, unspecified: Secondary | ICD-10-CM | POA: Diagnosis not present

## 2014-12-12 DIAGNOSIS — R55 Syncope and collapse: Secondary | ICD-10-CM

## 2014-12-12 DIAGNOSIS — Z7901 Long term (current) use of anticoagulants: Secondary | ICD-10-CM

## 2014-12-12 DIAGNOSIS — F0390 Unspecified dementia without behavioral disturbance: Secondary | ICD-10-CM

## 2014-12-12 LAB — TROPONIN I: Troponin I: <0.03 ng/mL (ref <0.031)

## 2014-12-12 LAB — BASIC METABOLIC PANEL
ANION GAP: 4 — AB (ref 5–15)
BUN: 24 mg/dL — ABNORMAL HIGH (ref 6–23)
CHLORIDE: 107 mmol/L (ref 96–112)
CO2: 25 mmol/L (ref 19–32)
Calcium: 8.9 mg/dL (ref 8.4–10.5)
Creatinine, Ser: 1.57 mg/dL — ABNORMAL HIGH (ref 0.50–1.35)
GFR calc Af Amer: 45 mL/min — ABNORMAL LOW (ref >90)
GFR calc non Af Amer: 39 mL/min — ABNORMAL LOW (ref >90)
GLUCOSE: 105 mg/dL — AB (ref 70–99)
POTASSIUM: 3.8 mmol/L (ref 3.5–5.1)
SODIUM: 136 mmol/L (ref 135–145)

## 2014-12-12 LAB — CBC
HCT: 37.9 % — ABNORMAL LOW (ref 39.0–52.0)
Hemoglobin: 12.8 g/dL — ABNORMAL LOW (ref 13.0–17.0)
MCH: 28.5 pg (ref 26.0–34.0)
MCHC: 33.8 g/dL (ref 30.0–36.0)
MCV: 84.4 fL (ref 78.0–100.0)
PLATELETS: 147 10*3/uL — AB (ref 150–400)
RBC: 4.49 MIL/uL (ref 4.22–5.81)
RDW: 14.3 % (ref 11.5–15.5)
WBC: 7.5 10*3/uL (ref 4.0–10.5)

## 2014-12-12 MED ORDER — METOPROLOL TARTRATE 25 MG PO TABS
25.0000 mg | ORAL_TABLET | Freq: Two times a day (BID) | ORAL | Status: DC
  Administered 2014-12-13: 25 mg via ORAL
  Filled 2014-12-12 (×2): qty 1

## 2014-12-12 MED ORDER — POLYVINYL ALCOHOL 1.4 % OP SOLN
1.0000 [drp] | Freq: Two times a day (BID) | OPHTHALMIC | Status: DC | PRN
  Filled 2014-12-12 (×2): qty 15

## 2014-12-12 NOTE — Progress Notes (Signed)
PROGRESS NOTE  Derek [name] YIR:[id number] DOB: August 27, 1927 DOA: 12/11/2014 PCP: Kandice Hams, MD  HPI/Recap of past 24 hours:  Feeling better, but poor historian due to poor memory. Not oriented to time but to person/place.  Assessment/Plan: Principal Problem:   Syncope Active Problems:   Dementia   Dizziness   Long term current use of anticoagulant therapy   Atrial fibrillation   AKI (acute kidney injury)  Syncope and collapse observation, telemetry. CT of the head was done showed no acute findings. Hold blood pressure medications including amlodipine and losartan, patient presented with low blood pressure. Hydrate with IV fluids,cardiac enzymes negative x3. Echo pending. Tele showed sinus rhythm with few pac's. bp trending up on 4/5, restarted home dose lopressor on 4/5.(received lower dose initially)  Acute kidney injury Patient appears to have chronic kidney disease even no diagnosis and has medical records. Creatinine baseline around 1.5 from September 2015. Patient presented with creatinine of 1.75, hydrate with IV fluids,cr 1.57on 4/5, hold losartan. ua concentrated, few bacteria, urine culture pending, no indication for abx currently.  Paroxysmal atrial fibrillation Initial on presentation in Afib,  Unclear of the syncope secondary to burst of rapid ventricular response or not. Inpatient tele showed sinus rhythm with few pac's, on beta blocker. Consider outpatient cardiac monitor.  Dementia/history of a stroke Stable, no changes. Patient is on aspirin and Eliquis, continued. Patient reported still drives, drives himself to cardiac rehab three time a week, not sure if reliable, consider care manager consult.  Code Status: full  Family Communication: patient  Disposition Plan: home, likely 4/6   Consultants:  none  Procedures:  none  Antibiotics:  none   Objective: BP 179/79 mmHg  Pulse 85  Temp(Src) 97.9 F (36.6 C) (Oral)  Resp 18  Ht  5\' 7"  (1.702 m)  Wt 83.553 kg (184 lb 3.2 oz)  BMI 28.84 kg/m2  SpO2 95%  Intake/Output Summary (Last 24 hours) at 12/12/14 1357 Last data filed at 12/12/14 1049  Gross per 24 hour  Intake 1236.1 ml  Output    600 ml  Net  636.1 ml   Filed Weights   12/11/14 1133 12/11/14 1654  Weight: 86.101 kg (189 lb 13.1 oz) 83.553 kg (184 lb 3.2 oz)    Exam:   General:  NAD  Cardiovascular: RRR, + murmur (reported has murmur since he was a child)  Respiratory: CTABL  Abdomen: Soft/ND/NT, positive BS  Musculoskeletal: No Edema  Neuro: poor memory, no focal deficit.  Data Reviewed: Basic Metabolic Panel:  Recent Labs Lab 12/11/14 1132 12/12/14 0533  NA 140 136  K 4.3 3.8  CL 106 107  CO2 21 25  GLUCOSE 119* 105*  BUN 22 24*  CREATININE 1.75* 1.57*  CALCIUM 9.7 8.9  MG 1.9  --    Liver Function Tests: No results for input(s): AST, ALT, ALKPHOS, BILITOT, PROT, ALBUMIN in the last 168 hours. No results for input(s): LIPASE, AMYLASE in the last 168 hours. No results for input(s): AMMONIA in the last 168 hours. CBC:  Recent Labs Lab 12/11/14 1132 12/12/14 0533  WBC 8.9 7.5  NEUTROABS 4.7  --   HGB 15.6 12.8*  HCT 45.9 37.9*  MCV 85.8 84.4  PLT 206 147*   Cardiac Enzymes:    Recent Labs Lab 12/11/14 1837 12/11/14 2311 12/12/14 0533  TROPONINI <0.03 <0.03 <0.03   BNP (last 3 results) No results for input(s): BNP in the last 8760 hours.  ProBNP (last 3 results) No results for  input(s): PROBNP in the last 8760 hours.  CBG:  Recent Labs Lab 12/11/14 1202  GLUCAP 112*    No results found for this or any previous visit (from the past 240 hour(s)).   Studies: No results found.  Scheduled Meds: . apixaban  2.5 mg Oral BID  . aspirin EC  81 mg Oral Daily  . cholecalciferol  1,000 Units Oral Daily  . donepezil  10 mg Oral QHS  . metoprolol tartrate  12.5 mg Oral BID  . pantoprazole  40 mg Oral Daily  . sodium chloride  3 mL Intravenous Q12H     Continuous Infusions: . sodium chloride 75 mL/hr at 12/12/14 0519     Time spent: >18mins  Sherman Lipuma MD, PhD  [facility] Pager [phone]. If 7PM-7AM, please contact night-coverage at [email or web address], password [facility] 12/12/2014, 1:57 PM

## 2014-12-12 NOTE — Progress Notes (Signed)
UR completed 

## 2014-12-13 ENCOUNTER — Encounter (HOSPITAL_COMMUNITY): Admission: RE | Admit: 2014-12-13 | Payer: BLUE CROSS/BLUE SHIELD | Source: Ambulatory Visit

## 2014-12-13 DIAGNOSIS — F0390 Unspecified dementia without behavioral disturbance: Secondary | ICD-10-CM | POA: Diagnosis not present

## 2014-12-13 DIAGNOSIS — N179 Acute kidney failure, unspecified: Secondary | ICD-10-CM | POA: Diagnosis not present

## 2014-12-13 DIAGNOSIS — R55 Syncope and collapse: Secondary | ICD-10-CM | POA: Diagnosis not present

## 2014-12-13 LAB — HEMOGLOBIN A1C
Hgb A1c MFr Bld: 6.3 % — ABNORMAL HIGH (ref 4.8–5.6)
MEAN PLASMA GLUCOSE: 134 mg/dL

## 2014-12-13 LAB — BASIC METABOLIC PANEL
ANION GAP: 9 (ref 5–15)
BUN: 16 mg/dL (ref 6–23)
CHLORIDE: 103 mmol/L (ref 96–112)
CO2: 27 mmol/L (ref 19–32)
Calcium: 9.5 mg/dL (ref 8.4–10.5)
Creatinine, Ser: 1.24 mg/dL (ref 0.50–1.35)
GFR calc non Af Amer: 51 mL/min — ABNORMAL LOW (ref >90)
GFR, EST AFRICAN AMERICAN: 59 mL/min — AB (ref >90)
Glucose, Bld: 107 mg/dL — ABNORMAL HIGH (ref 70–99)
Potassium: 3.7 mmol/L (ref 3.5–5.1)
Sodium: 139 mmol/L (ref 135–145)

## 2014-12-13 LAB — URINE CULTURE
CULTURE: NO GROWTH
Colony Count: NO GROWTH

## 2014-12-13 LAB — HEPATIC FUNCTION PANEL
ALBUMIN: 3.5 g/dL (ref 3.5–5.2)
ALK PHOS: 71 U/L (ref 39–117)
ALT: 24 U/L (ref 0–53)
AST: 26 U/L (ref 0–37)
BILIRUBIN TOTAL: 0.6 mg/dL (ref 0.3–1.2)
Bilirubin, Direct: 0.1 mg/dL (ref 0.0–0.5)
Indirect Bilirubin: 0.5 mg/dL (ref 0.3–0.9)
TOTAL PROTEIN: 6.4 g/dL (ref 6.0–8.3)

## 2014-12-13 LAB — CBC
HEMATOCRIT: 42.7 % (ref 39.0–52.0)
HEMOGLOBIN: 14.2 g/dL (ref 13.0–17.0)
MCH: 28.5 pg (ref 26.0–34.0)
MCHC: 33.3 g/dL (ref 30.0–36.0)
MCV: 85.7 fL (ref 78.0–100.0)
Platelets: 169 10*3/uL (ref 150–400)
RBC: 4.98 MIL/uL (ref 4.22–5.81)
RDW: 14.3 % (ref 11.5–15.5)
WBC: 7.5 10*3/uL (ref 4.0–10.5)

## 2014-12-13 NOTE — Significant Event (Signed)
1915-patient discharged to home. Reviewed AVS with patient and his son. Both verbalized understanding of content. All personal belongings taken home with patient. Patient taken to transportation by [place], [place]. Lamica Mccart, RN>

## 2014-12-13 NOTE — Discharge Summary (Signed)
Physician Discharge Summary  Derek [name] VWU:[id number] DOB: [birth year] DOA: 12/11/2014  PCP: Kandice Hams, MD  Admit date: 12/11/2014 Discharge date: 12/13/2014  Time spent: 95minutes  Recommendations for Outpatient Follow-up:  1. Dr.Smith or Cards PA in 2 weeks 2. Event Monitor, he will get a call from [facility] to pick this up 3. Bmet in 1 week 4. Losartan stopped, may need it resumed at low dose if BP trends up  Discharge Diagnoses:  Principal Problem:   Syncope Active Problems:   Dementia   Dizziness   Long term current use of anticoagulant therapy   Atrial fibrillation   AKI (acute kidney injury)   Discharge Condition: stable  Diet recommendation: heart healthy  Filed Weights   12/11/14 1133 12/11/14 1654  Weight: 86.101 kg (189 lb 13.1 oz) 83.553 kg (184 lb 3.2 oz)    History of present illness:   Derek [name] is a 81 y.o. male with past medical history of CVA, paroxysmal atrial fibrillation, CAD with history of stent 11/14, patient on both aspirin and low-dose Eliquis. Patient was in cardiac rehabilitation session  and after he finished he developed hypotension, syncope and collapse, CODE BLUE was called but patient regained consciousness spontaneously, per reports he was incontinent of urine. Brought to the hospital for further evaluation.  Hospital Course:  Syncope and collapse -etiology unclear, cardiac enzymes negative, orthostatics negative -CT of the head was done showed no acute findings. -ECHO with normal EF and wall motion -Initially BP meds were held due to soft BPs on admission, then amlodipine resumed -Tele showed sinus rhythm with few pac's. -medically stable, called [facility] for event Monitor, they will call him to set this up -advised not to drive or operate machinery  Acute kidney injury Patient appears to have chronic kidney disease even no diagnosis and has medical records. Creatinine baseline around 1.5 from September  2015. -improved with hydration, losartan held, could be resumed at lower dose  Paroxysmal atrial fibrillation Initial on presentation in Afib,  Unclear of the syncope secondary to burst of rapid ventricular response or not. Inpatient tele showed sinus rhythm with few pac's, continue beta blocker, eliquis,  -Outpatient cardiac monitor arranged  Dementia/history of a stroke Stable, no changes. Patient is on aspirin and Eliquis, continued.    Procedures:  ECHO  Discharge Exam: Filed Vitals:   12/13/14 0536  BP: 161/84  Pulse: 63  Temp: 97.5 F (36.4 C)  Resp: 18    General: AAOx3 Cardiovascular: S1S2/RRR Respiratory: CTAB  Discharge Instructions    Current Discharge Medication List    CONTINUE these medications which have NOT CHANGED   Details  amLODipine (NORVASC) 10 MG tablet Take 10 mg by mouth daily.    aspirin EC 81 MG tablet Take 81 mg by mouth daily.    cholecalciferol (VITAMIN D) 1000 UNITS tablet Take 1,000 Units by mouth daily.    Coenzyme Q10 (COQ10) 100 MG CAPS Take 100 mg by mouth daily.     donepezil (ARICEPT) 10 MG tablet Take 1 tablet (10 mg total) by mouth at bedtime. Qty: 90 tablet, Refills: 1    ELIQUIS 2.5 MG TABS tablet TAKE 1 TABLET TWICE DAILY. Qty: 180 tablet, Refills: 1    Multiple Vitamins-Minerals (CENTRUM SILVER PO) Take 1 tablet by mouth daily.     NITROSTAT 0.4 MG SL tablet DISSOLVE 1 TABLET UNDER TONGUE AS NEEDED FOR CHEST PAIN,MAY REPEAT IN5 MINUTES FOR 2 DOSES. Qty: 25 tablet, Refills: 0    omeprazole (  PRILOSEC) 20 MG capsule Take 20 mg by mouth daily.    atorvastatin (LIPITOR) 40 MG tablet TAKE 1 TABLET ONCE DAILY AT 6 PM. Qty: 90 tablet, Refills: 1    metoprolol tartrate (LOPRESSOR) 25 MG tablet Take 1 tablet (25 mg total) by mouth 2 (two) times daily. Qty: 60 tablet, Refills: 0      STOP taking these medications     losartan (COZAAR) 100 MG tablet        Allergies  Allergen Reactions  . Augmentin  [Amoxicillin-Pot Clavulanate] Rash  . Penicillins Rash  . Sulfa Antibiotics Rash      The results of significant diagnostics from this hospitalization (including imaging, microbiology, ancillary and laboratory) are listed below for reference.    Significant Diagnostic Studies: No results found.  Microbiology: No results found for this or any previous visit (from the past 240 hour(s)).   Labs: Basic Metabolic Panel:  Recent Labs Lab 12/11/14 1132 12/12/14 0533 12/13/14 0545  NA 140 136 139  K 4.3 3.8 3.7  CL 106 107 103  CO2 21 25 27   GLUCOSE 119* 105* 107*  BUN 22 24* 16  CREATININE 1.75* 1.57* 1.24  CALCIUM 9.7 8.9 9.5  MG 1.9  --   --    Liver Function Tests:  Recent Labs Lab 12/13/14 0545  AST 26  ALT 24  ALKPHOS 71  BILITOT 0.6  PROT 6.4  ALBUMIN 3.5   No results for input(s): LIPASE, AMYLASE in the last 168 hours. No results for input(s): AMMONIA in the last 168 hours. CBC:  Recent Labs Lab 12/11/14 1132 12/12/14 0533 12/13/14 0545  WBC 8.9 7.5 7.5  NEUTROABS 4.7  --   --   HGB 15.6 12.8* 14.2  HCT 45.9 37.9* 42.7  MCV 85.8 84.4 85.7  PLT 206 147* 169   Cardiac Enzymes:  Recent Labs Lab 12/11/14 1837 12/11/14 2311 12/12/14 0533  TROPONINI <0.03 <0.03 <0.03   BNP: BNP (last 3 results) No results for input(s): BNP in the last 8760 hours.  ProBNP (last 3 results) No results for input(s): PROBNP in the last 8760 hours.  CBG:  Recent Labs Lab 12/11/14 1202  GLUCAP 112*       Signed:  Ilia Dimaano  [facility] 12/13/2014, 3:07 PM

## 2014-12-13 NOTE — Progress Notes (Signed)
  Echocardiogram 2D Echocardiogram has been performed.  Derek [name] 12/13/2014, 12:17 PM

## 2014-12-13 NOTE — Progress Notes (Signed)
UR completed 

## 2014-12-13 NOTE — Significant Event (Signed)
Patient ambulated in unit, approximately 350 feet. Stable during ambulation. Returned to room and settled in chair.

## 2014-12-14 ENCOUNTER — Telehealth: Payer: Self-pay | Admitting: Interventional Cardiology

## 2014-12-14 NOTE — Telephone Encounter (Signed)
7 day TOC pt--appt [name] 12-21-14 on day Derek [name] is here

## 2014-12-14 NOTE — Telephone Encounter (Signed)
Discontinue aspirin and use only Apixiban

## 2014-12-14 NOTE — Telephone Encounter (Signed)
Notified patient's POA that Dr. Tamala Julian advises to stop the ASA. Patient's POA verbalized understanding and agreement. He has not had any ASA today.

## 2014-12-14 NOTE — Telephone Encounter (Signed)
TCM call:  Patient POA contacted regarding discharge from hospital on December 13, 2014.  Patient understands to follow up with Angelena Form, PA, on 4/14 @ 0930.  Patient understands discharge instructions to see provider before starting back to Cardiac Rehab. Reviewed each medication with patient POA to determine right medication, right dose, right frequency. Patient POA verified that Losartan was discontinued. Verbalized understanding that ASA was still on medication list. Nurse will double check with Dr. Tamala Julian if patient is to be on both ASA and Eliquis. Patient POA states patient is also taking isosorbide 30 mg daily - nurse will inform Dr. Tamala Julian. Patient POA also notified of upcoming event monitor recommendation and to expect a call to schedule that procedure/fitting. Patient POA would like Lipid Panel added to BMET when it is next drawn. Will route to Dr. Tamala Julian.

## 2014-12-14 NOTE — Telephone Encounter (Signed)
Follow up:    POA  Called and has questions about med. LOSARTAN and pt's lab work at the [facility].  Please give her a call back.

## 2014-12-15 ENCOUNTER — Encounter (HOSPITAL_COMMUNITY): Payer: Self-pay

## 2014-12-15 NOTE — Addendum Note (Signed)
Addended by: Lamar Laundry on: 12/15/2014 07:38 AM   Modules accepted: Orders, Medications

## 2014-12-18 ENCOUNTER — Encounter (HOSPITAL_COMMUNITY): Payer: Self-pay

## 2014-12-19 NOTE — Progress Notes (Signed)
Cardiology Office Note   Date:  12/21/2014   ID:  Derek [name], DOB August 01, 1927, MRN [id number]  PCP:  Kandice Hams, MD  Cardiologist:  Dr. Fransisca Kaufmann hospital follow up for syncope.     History of Present Illness: Derek [name] is a 81 y.o. male with a history of HTN, HLD, prior TIA/CVA, paroxysmal atrial fibrillation on Eliquis, and CAD s/p BMS to LCx (07/2013) who presents today for post hospital follow up after a recent admission for syncope.   He is followed by Dr. Tamala Julian and underwent BMS to LCx in 2014. He was seen in the office in 05/2014 for chest pain.  Myoview was abnormal with evidence of apical lateral ischemia. Subsequent cardiac catheterization demonstrated a widely patent proximal LAD and circumflex. The circumflex stent was widely patent. There was high-grade disease in the first diagonal, fifth obtuse marginal, and PDA (of circumflex origin) accounting for the high risk abnormality noted on perfusion scanning and a diffusely diseased nondominant RCA.LVF was preserved with anteroapical hypokinesis. Continued medical Rx was recommended. He was restarted on Eliquis and no ASA.  He was recently admitted to [facility] from 12/11/14-12/13/14 for an episode of syncope. Patient was in cardiac rehabilitation session and after he finished he developed hypotension, syncope and collapse, CODE BLUE was called but patient regained consciousness spontaneously, per reports he was incontinent of urine. Brought to the hospital for further evaluation.  During his admission the etiology of his syncope remained unclear. He was initally noted to be in afib with RVR but this spontaneously converted into NSR in the ED. His cardiac enzymes remained negative, orthostatics negative, CT of the head was done showed no acute findings, ECHO with normal EF and wall motion. Initially BP meds were held due to soft BPs on admission. He was also noted to have CKD (Creat baseline around 1.5 from September 2015)  and AKI during hosptial stay. This improved with hydration and losartan held. He was discharged on 12/13/14 and outpatient cardiac monitor arranged.  Today he presents for follow up. He is feeling quite well. He has had no more dizziness or syncope. He denies CP, SOB, LE edema, PND, orthopnea or blood in his stool or urine.    Past Medical History  Diagnosis Date  . CVA (cerebral vascular accident)   . Broken shoulder   . Cerebrovascular disease     MRI/MRA in 2007 - 50% stenosis of supraclinoid ICA, left  . Shingles     left chest  . Compression fracture   . Hypertension   . PAF (paroxysmal atrial fibrillation)     hx of NSTEMI in setting of AF with RVR 07/2013;  Eliquis for AC  . Clavicle fracture   . Syncope   . History of pneumonia   . Complex sleep apnea syndrome     adapt SV titration EEP 6 min 6 and max 15 cm water.   Marland Kitchen TIA (transient ischemic attack) 07/12/2013  . Coronary atherosclerosis of native coronary artery 07/12/2013    a. NSTEMI in setting of AF with RVR 11/14 >> BMS to CFX;  b. Nuclear (9/15):  High risk - apical lateral ischemia >> c. LHC (9/15):  pLAD 50%, D1 (smaller br 90%/larger br 80%, pDx 50-60%, dLAD 50%, prox-mid CFX stent ok, pOM1 and OM2 50-70% (jailed by stent), OM5 70-80%, mPDA 90% (CFX dsz unchanged from 2014), RCA diff dsz, EF 55%, mild ant-apical HK >>  Med Rx  . Hx of echocardiogram  a. Echo 11/14): EF 55-60%, no RWMA, grade 2 DD, mild aortic stenosis (mean 15 mmHg), mild LAE  . MI (myocardial infarction) 07/2013  . Dementia     Past Surgical History  Procedure Laterality Date  . Tonsillectomy    . Cataract extraction Bilateral   . Kyphoplasty    . Shoulder surgery Left   . Left heart catheterization with coronary angiogram N/A 08/08/2013    Procedure: LEFT HEART CATHETERIZATION WITH CORONARY ANGIOGRAM;  Surgeon: Jettie Booze, MD;  Location: [facility] CATH LAB;  Service: Cardiovascular;  Laterality: N/A;  . Percutaneous stent intervention   08/08/2013    Procedure: PERCUTANEOUS STENT INTERVENTION;  Surgeon: Jettie Booze, MD;  Location: [facility] CATH LAB;  Service: Cardiovascular;;  . Left heart catheterization with coronary angiogram N/A 05/29/2014    Procedure: LEFT HEART CATHETERIZATION WITH CORONARY ANGIOGRAM;  Surgeon: Sinclair Grooms, MD;  Location: [facility] CATH LAB;  Service: Cardiovascular;  Laterality: N/A;     Current Outpatient Prescriptions  Medication Sig Dispense Refill  . amLODipine (NORVASC) 10 MG tablet Take 10 mg by mouth daily.    Marland Kitchen apixaban (ELIQUIS) 2.5 MG TABS tablet Take 2.5 mg by mouth 2 (two) times daily.    Marland Kitchen atorvastatin (LIPITOR) 40 MG tablet Take 40 mg by mouth at bedtime.    . cholecalciferol (VITAMIN D) 1000 UNITS tablet Take 1,000 Units by mouth daily.    . Coenzyme Q10 (COQ10) 100 MG CAPS Take 100 mg by mouth daily.     Marland Kitchen donepezil (ARICEPT) 10 MG tablet Take 1 tablet (10 mg total) by mouth at bedtime. 90 tablet 1  . isosorbide mononitrate (IMDUR) 30 MG 24 hr tablet Take 30 mg by mouth daily.    . Multiple Vitamins-Minerals (CENTRUM SILVER PO) Take 1 tablet by mouth daily.     . Multiple Vitamins-Minerals (PRESERVISION AREDS 2) CAPS Take 2 capsules by mouth 2 (two) times daily.    Marland Kitchen NITROSTAT 0.4 MG SL tablet DISSOLVE 1 TABLET UNDER TONGUE AS NEEDED FOR CHEST PAIN,MAY REPEAT IN5 MINUTES FOR 2 DOSES. 25 tablet 0  . Olopatadine HCl 0.2 % SOLN Place 1 drop into both eyes daily.    Marland Kitchen omeprazole (PRILOSEC) 20 MG capsule Take 20 mg by mouth daily.     No current facility-administered medications for this visit.    Allergies:   Augmentin; Penicillins; and Sulfa antibiotics    Social History:  The patient  reports that he has never smoked. He has never used smokeless tobacco. He reports that he drinks about 0.6 oz of alcohol per week. He reports that he does not use illicit drugs.   Family History:  The patient's family history includes Cancer in his daughter; Cancer - Lung in his brother; Diabetes in  his father and mother; Gait disorder in his brother; Stroke in his sister. There is no history of Heart attack.    ROS:  Please see the history of present illness.   Otherwise, review of systems are positive for none.   All other systems are reviewed and negative.    PHYSICAL EXAM: VS:  BP 142/72 mmHg  Pulse 56  Wt 190 lb (86.183 kg)  SpO2 97% , BMI Body mass index is 29.75 kg/(m^2). GEN: Well nourished, well developed, in no acute distress HEENT: normal Neck: no JVD, carotid bruits, or masses Cardiac: RRR; no murmurs, rubs, or gallops,no edema  Respiratory:  clear to auscultation bilaterally, normal work of breathing GI: soft, nontender, nondistended, + BS MS: no  deformity or atrophy Skin: warm and dry, no rash Neuro:  Strength and sensation are intact Psych: euthymic mood, full affect   EKG:  EKG is ordered today. The ekg ordered today demonstrates sinus brady HR 56 bpm. With marked sinus arrhythmia.    Recent Labs: 12/11/2014: Magnesium 1.9; TSH 0.902 12/13/2014: ALT 24; BUN 16; Creatinine 1.24; Hemoglobin 14.2; Platelets 169; Potassium 3.7; Sodium 139    Lipid Panel     Wt Readings from Last 3 Encounters:  12/21/14 190 lb (86.183 kg)  12/11/14 184 lb 3.2 oz (83.553 kg)  07/05/14 192 lb (87.091 kg)      Other studies Reviewed: Additional studies/ records that were reviewed today include: 2D ECHO, NST, LHC Review of the above records demonstrates:  - Echo (11/14): EF 55-60%, no RWMA, grade 2 DD, mild aortic stenosis (mean 15 mmHg), mild LAE - Nuclear (9/15): High risk stress nuclear study . There is evidence of apical lateral ischemia.  - LHC (9/15): pLAD 50%, D1 (smaller br 90%/larger br 80%, pDx 50-60%, dLAD 50%, prox-mid CFX stent ok, pOM1 and OM2 50-70% (jailed by stent), OM5 70-80%, mPDA 90% (CFX dsz unchanged from 2014), RCA diff dsz, EF 55%, mild ant-apical HK >> med Rx - Echo (12/13/14): EF 60-65%, mod LVH, mild focal hypertrophy of septum, no RWMA, g1DD     ASSESSMENT AND PLAN:  Derek [name] is a 81 y.o. male with a history of HTN, HLD, prior TIA/CVA, paroxysmal atrial fibrillation on Eliquis, and CAD s/p BMS to LCx (07/2013) who presents today for post hospital follow up after a recent admission for syncope.   Syncope: w/u in hospital unrevealing. Possibly caused by short run of afib with RVR that spontaneously converted into NSR in the ED. -- No further dizziness or syncope -- Cardiac event monitor will be arranged today.  CAD: no chest pain or objective signs of ischemia -- Cardiac cath 05/2014 with patent CFX stent and high grade disease in the D1, OM5, PDA. Medical Rx was recommended.  -- Continue amlodipine, nitrates, statin, beta blocker. He is not on ASA as he is on Eliquis.  -- Continue maintenance program at Cardiac Rehab. He is cleared today for return   Paroxysmal atrial fibrillation: Noted to have a short run of afib with RVR that spontaneously converted into NSR when admitted to the hospital. This may be the cause of his syncope.  -- Currently maintaining NSR.Continue Eliquis and lopressor 25mg  BID -- Will place event monitor as above.   Essential hypertension: Well controlled today 142/72. Continue amlodipine 10mg  and lopressor 25mg  BID. Losaratan 100mg  held at discharge due to AKI and some hypotension. Will recheck BMET today. Continue to hold for now.   XLK:GMWNUUVO statin. Will order lipid panel at patients request.   OSA on CPAP: He is followed by Neurology (Dr. Brett Fairy).   Current medicines are reviewed at length with the patient today.  The patient does not have concerns regarding medicines.  The following changes have been made:  no change  Labs/ tests ordered today include:   Orders Placed This Encounter  Procedures  . Lipid Profile  . Basic Metabolic Panel (BMET)  . EKG 12-Lead  . Cardiac event monitor     Disposition:   FU with Dr Tamala Julian in 4-6 weeks.    Renea Ee  12/21/2014 10:34 AM    [facility] [place], [place], [place]  [address] Phone: [phone]; Fax: [phone]

## 2014-12-20 ENCOUNTER — Encounter (HOSPITAL_COMMUNITY): Payer: Self-pay

## 2014-12-21 ENCOUNTER — Encounter: Payer: Self-pay | Admitting: Physician Assistant

## 2014-12-21 ENCOUNTER — Ambulatory Visit (INDEPENDENT_AMBULATORY_CARE_PROVIDER_SITE_OTHER): Payer: Medicare Other | Admitting: Physician Assistant

## 2014-12-21 VITALS — BP 142/72 | HR 56 | Wt 190.0 lb

## 2014-12-21 DIAGNOSIS — I10 Essential (primary) hypertension: Secondary | ICD-10-CM | POA: Diagnosis not present

## 2014-12-21 DIAGNOSIS — R55 Syncope and collapse: Secondary | ICD-10-CM

## 2014-12-21 DIAGNOSIS — I4891 Unspecified atrial fibrillation: Secondary | ICD-10-CM

## 2014-12-21 DIAGNOSIS — E785 Hyperlipidemia, unspecified: Secondary | ICD-10-CM | POA: Diagnosis not present

## 2014-12-21 LAB — BASIC METABOLIC PANEL
BUN: 23 mg/dL (ref 6–23)
CHLORIDE: 103 meq/L (ref 96–112)
CO2: 29 mEq/L (ref 19–32)
Calcium: 9.9 mg/dL (ref 8.4–10.5)
Creatinine, Ser: 1.5 mg/dL (ref 0.40–1.50)
GFR: 47.2 mL/min — AB (ref >60.00)
Glucose, Bld: 106 mg/dL — ABNORMAL HIGH (ref 70–99)
Potassium: 4.2 mEq/L (ref 3.5–5.1)
SODIUM: 138 meq/L (ref 135–145)

## 2014-12-21 NOTE — Patient Instructions (Signed)
Medication Instructions:  None  Labwork: BMET today Your physician recommends that you return for lab work when you have your event monitor placed (Lipids)   Testing/Procedures: Your physician has recommended that you wear an event monitor. Event monitors are medical devices that record the heart's electrical activity. Doctors most often [place] these monitors to diagnose arrhythmias. Arrhythmias are problems with the speed or rhythm of the heartbeat. The monitor is a small, portable device. You can wear one while you do your normal daily activities. This is usually used to diagnose what is causing palpitations/syncope (passing out).   Follow-Up: Your physician recommends that you schedule a follow-up appointment in: 4-6 weeks with Dr. Tamala Julian or his first available.    Any Other Special Instructions Will Be Listed Below (If Applicable). You are cleared to return to Cardiac Rehab!

## 2014-12-22 ENCOUNTER — Encounter (HOSPITAL_COMMUNITY): Payer: Self-pay

## 2014-12-25 ENCOUNTER — Encounter (HOSPITAL_COMMUNITY): Payer: Self-pay

## 2014-12-27 ENCOUNTER — Encounter (INDEPENDENT_AMBULATORY_CARE_PROVIDER_SITE_OTHER): Payer: Medicare Other

## 2014-12-27 ENCOUNTER — Other Ambulatory Visit (INDEPENDENT_AMBULATORY_CARE_PROVIDER_SITE_OTHER): Payer: Medicare Other | Admitting: *Deleted

## 2014-12-27 ENCOUNTER — Encounter: Payer: Self-pay | Admitting: *Deleted

## 2014-12-27 ENCOUNTER — Encounter (HOSPITAL_COMMUNITY): Payer: Self-pay

## 2014-12-27 DIAGNOSIS — E785 Hyperlipidemia, unspecified: Secondary | ICD-10-CM

## 2014-12-27 DIAGNOSIS — R55 Syncope and collapse: Secondary | ICD-10-CM | POA: Diagnosis not present

## 2014-12-27 LAB — LIPID PANEL
CHOLESTEROL: 100 mg/dL (ref 0–200)
HDL: 29.5 mg/dL — ABNORMAL LOW (ref >39.00)
LDL Cholesterol: 57 mg/dL (ref 0–99)
NonHDL: 70.5
Total CHOL/HDL Ratio: 3
Triglycerides: 67 mg/dL (ref 0.0–149.0)
VLDL: 13.4 mg/dL (ref 0.0–40.0)

## 2014-12-27 NOTE — Addendum Note (Signed)
Addended by: Eulis Foster on: 12/27/2014 09:15 AM   Modules accepted: Orders

## 2014-12-27 NOTE — Progress Notes (Signed)
Patient ID: Derek [name], male   DOB: 05/19/1927, 81 y.o.   MRN: [id number] [employer] 30 Day Monitor Applied

## 2014-12-29 ENCOUNTER — Encounter (HOSPITAL_COMMUNITY): Payer: Self-pay

## 2015-01-01 ENCOUNTER — Encounter (HOSPITAL_COMMUNITY): Payer: Self-pay

## 2015-01-03 ENCOUNTER — Encounter (HOSPITAL_COMMUNITY): Payer: Self-pay

## 2015-01-03 ENCOUNTER — Other Ambulatory Visit: Payer: Self-pay | Admitting: Interventional Cardiology

## 2015-01-05 ENCOUNTER — Encounter (HOSPITAL_COMMUNITY): Payer: Self-pay

## 2015-01-08 ENCOUNTER — Encounter (HOSPITAL_COMMUNITY): Payer: Self-pay

## 2015-01-10 ENCOUNTER — Encounter (HOSPITAL_COMMUNITY): Payer: Self-pay

## 2015-01-12 ENCOUNTER — Encounter (HOSPITAL_COMMUNITY): Payer: Self-pay

## 2015-01-15 ENCOUNTER — Encounter (HOSPITAL_COMMUNITY): Payer: Self-pay

## 2015-01-17 ENCOUNTER — Encounter (HOSPITAL_COMMUNITY): Payer: Self-pay

## 2015-01-19 ENCOUNTER — Encounter (HOSPITAL_COMMUNITY): Payer: Self-pay

## 2015-01-22 ENCOUNTER — Encounter (HOSPITAL_COMMUNITY): Payer: Self-pay

## 2015-01-24 ENCOUNTER — Encounter (HOSPITAL_COMMUNITY): Payer: Self-pay

## 2015-01-26 ENCOUNTER — Encounter (HOSPITAL_COMMUNITY): Payer: Self-pay

## 2015-01-29 ENCOUNTER — Encounter (HOSPITAL_COMMUNITY): Payer: Self-pay

## 2015-01-31 ENCOUNTER — Encounter (HOSPITAL_COMMUNITY): Payer: Self-pay

## 2015-02-02 ENCOUNTER — Encounter (HOSPITAL_COMMUNITY): Payer: Self-pay

## 2015-02-07 ENCOUNTER — Encounter (HOSPITAL_COMMUNITY): Payer: Self-pay

## 2015-02-09 ENCOUNTER — Encounter (HOSPITAL_COMMUNITY): Payer: Self-pay

## 2015-02-12 ENCOUNTER — Encounter (HOSPITAL_COMMUNITY): Payer: Self-pay

## 2015-02-12 ENCOUNTER — Telehealth: Payer: Self-pay

## 2015-02-12 NOTE — Telephone Encounter (Signed)
Called to give pt cardiac monitor results. [email or web address]

## 2015-02-13 ENCOUNTER — Other Ambulatory Visit: Payer: Self-pay | Admitting: Nurse Practitioner

## 2015-02-13 NOTE — Telephone Encounter (Signed)
Spoke with daughter, DPR on file. Informed daughter of monitor results. Daughter verbalized understanding. Daughter also states that Cardiac Rehab was put on hold until monitor results came back. Daughter states that she will speak with her dad and see if he plans to continue with the cardiac rehab and will contact the office if he does so that we can send over ok to continue rehab.

## 2015-02-13 NOTE — Telephone Encounter (Signed)
F/u   Pt's daughter returning a call from nurse.

## 2015-02-14 ENCOUNTER — Encounter (HOSPITAL_COMMUNITY): Payer: Self-pay

## 2015-02-16 ENCOUNTER — Encounter (HOSPITAL_COMMUNITY): Payer: Self-pay

## 2015-02-19 ENCOUNTER — Encounter (HOSPITAL_COMMUNITY): Payer: Self-pay

## 2015-02-21 ENCOUNTER — Encounter (HOSPITAL_COMMUNITY): Payer: Self-pay

## 2015-02-23 ENCOUNTER — Encounter (HOSPITAL_COMMUNITY): Payer: Self-pay

## 2015-02-26 ENCOUNTER — Encounter (HOSPITAL_COMMUNITY): Payer: Self-pay

## 2015-02-28 ENCOUNTER — Encounter (HOSPITAL_COMMUNITY): Payer: Self-pay

## 2015-03-02 ENCOUNTER — Encounter (HOSPITAL_COMMUNITY): Payer: Self-pay

## 2015-03-04 NOTE — Progress Notes (Signed)
Cardiology Office Note   Date:  03/04/2015   ID:  Derek [name], DOB [birth year], MRN [id number]  PCP:  Kandice Hams, MD  Cardiologist:  Sinclair Grooms, MD   Chief Complaint  Patient presents with  . Coronary Artery Disease      History of Present Illness: Derek [name] is a 81 y.o. male who presents for  coronary artery disease, paroxysmal atrial fibrillation, embolic CVA, , and chronic anticoagulation therapy.    The patient is doing well. He did stomal and fall recently while walking. He has not had palpitations. No transient neurological complaints. He has had no bleeding or head trauma.   Past Medical History  Diagnosis Date  . CVA (cerebral vascular accident)   . Broken shoulder   . Cerebrovascular disease     MRI/MRA in 2007 - 50% stenosis of supraclinoid ICA, left  . Shingles     left chest  . Compression fracture   . Hypertension   . PAF (paroxysmal atrial fibrillation)     hx of NSTEMI in setting of AF with RVR 07/2013;  Eliquis for AC  . Clavicle fracture   . Syncope   . History of pneumonia   . Complex sleep apnea syndrome     adapt SV titration EEP 6 min 6 and max 15 cm water.   Marland Kitchen TIA (transient ischemic attack) 07/12/2013  . Coronary atherosclerosis of native coronary artery 07/12/2013    a. NSTEMI in setting of AF with RVR 11/14 >> BMS to CFX;  b. Nuclear (9/15):  High risk - apical lateral ischemia >> c. LHC (9/15):  pLAD 50%, D1 (smaller br 90%/larger br 80%, pDx 50-60%, dLAD 50%, prox-mid CFX stent ok, pOM1 and OM2 50-70% (jailed by stent), OM5 70-80%, mPDA 90% (CFX dsz unchanged from 2014), RCA diff dsz, EF 55%, mild ant-apical HK >>  Med Rx  . Hx of echocardiogram     a. Echo 11/14): EF 55-60%, no RWMA, grade 2 DD, mild aortic stenosis (mean 15 mmHg), mild LAE  . MI (myocardial infarction) 07/2013  . Dementia     Past Surgical History  Procedure Laterality Date  . Tonsillectomy    . Cataract extraction Bilateral   . Kyphoplasty    .  Shoulder surgery Left   . Left heart catheterization with coronary angiogram N/A 08/08/2013    Procedure: LEFT HEART CATHETERIZATION WITH CORONARY ANGIOGRAM;  Surgeon: Jettie Booze, MD;  Location: [facility] CATH LAB;  Service: Cardiovascular;  Laterality: N/A;  . Percutaneous stent intervention  08/08/2013    Procedure: PERCUTANEOUS STENT INTERVENTION;  Surgeon: Jettie Booze, MD;  Location: [facility] CATH LAB;  Service: Cardiovascular;;  . Left heart catheterization with coronary angiogram N/A 05/29/2014    Procedure: LEFT HEART CATHETERIZATION WITH CORONARY ANGIOGRAM;  Surgeon: Sinclair Grooms, MD;  Location: [facility] CATH LAB;  Service: Cardiovascular;  Laterality: N/A;     Current Outpatient Prescriptions  Medication Sig Dispense Refill  . amLODipine (NORVASC) 10 MG tablet Take 10 mg by mouth daily.    Marland Kitchen atorvastatin (LIPITOR) 40 MG tablet Take 40 mg by mouth at bedtime.    . cholecalciferol (VITAMIN D) 1000 UNITS tablet Take 1,000 Units by mouth daily.    . Coenzyme Q10 (COQ10) 100 MG CAPS Take 100 mg by mouth daily.     Marland Kitchen donepezil (ARICEPT) 10 MG tablet TAKE ONE TABLET AT BEDTIME. 30 tablet 0  . ELIQUIS 2.5 MG TABS tablet TAKE 1 TABLET TWICE DAILY.  180 tablet 1  . isosorbide mononitrate (IMDUR) 30 MG 24 hr tablet TAKE 1 TABLET ONCE DAILY. 90 tablet 1  . metoprolol tartrate (LOPRESSOR) 25 MG tablet TAKE 1 TABLET TWICE DAILY. 180 tablet 1  . Multiple Vitamins-Minerals (CENTRUM SILVER PO) Take 1 tablet by mouth daily.     . Multiple Vitamins-Minerals (PRESERVISION AREDS 2) CAPS Take 2 capsules by mouth 2 (two) times daily.    Marland Kitchen NITROSTAT 0.4 MG SL tablet DISSOLVE 1 TABLET UNDER TONGUE AS NEEDED FOR CHEST PAIN,MAY REPEAT IN5 MINUTES FOR 2 DOSES. 25 tablet 0  . Olopatadine HCl 0.2 % SOLN Place 1 drop into both eyes daily.    Marland Kitchen omeprazole (PRILOSEC) 20 MG capsule Take 20 mg by mouth daily.     No current facility-administered medications for this visit.    Allergies:   Augmentin; Penicillins;  and Sulfa antibiotics    Social History:  The patient  reports that he has never smoked. He has never used smokeless tobacco. He reports that he drinks about 0.6 oz of alcohol per week. He reports that he does not use illicit drugs.   Family History:  The patient's family history includes Cancer in his daughter; Cancer - Lung in his brother; Diabetes in his father and mother; Gait disorder in his brother; Stroke in his sister. There is no history of Heart attack.    ROS:  Please see the history of present illness.   Otherwise, review of systems are positive for  Dizziness and hearing loss.   All other systems are reviewed and negative.    PHYSICAL EXAM: VS:  There were no vitals taken for this visit. , BMI There is no weight on file to calculate BMI. GEN: Well nourished, well developed, in no acute distress HEENT: normal Neck: no JVD, carotid bruits, or masses Cardiac: RRR;  2/6 systolic murmur of the apex and left lower sternal border. No  rubs, or gallops,no edema  Respiratory:  clear to auscultation bilaterally, normal work of breathing GI: soft, nontender, nondistended, + BS MS: no deformity or atrophy Skin: warm and dry, no rash Neuro:  Strength and sensation are intact Psych: euthymic mood, full affect   EKG:  EKG is not ordered today.    Recent Labs: 12/11/2014: Magnesium 1.9; TSH 0.902 12/13/2014: ALT 24; Hemoglobin 14.2; Platelets 169 12/21/2014: BUN 23; Creatinine, Ser 1.50; Potassium 4.2; Sodium 138    Lipid Panel    Component Value Date/Time   CHOL 100 12/27/2014 0916   TRIG 67.0 12/27/2014 0916   HDL 29.50* 12/27/2014 0916   CHOLHDL 3 12/27/2014 0916   VLDL 13.4 12/27/2014 0916   LDLCALC 57 12/27/2014 0916      Wt Readings from Last 3 Encounters:  12/21/14 86.183 kg (190 lb)  12/11/14 83.553 kg (184 lb 3.2 oz)  07/05/14 87.091 kg (192 lb)      Other studies Reviewed: Additional studies/ records that were reviewed today include: .   ASSESSMENT AND  PLAN:  1. Atherosclerosis of native coronary artery of native heart without angina pectoris  No angina  2. Atrial fibrillation, unspecified  Intermittent episodes based upon monitoring with controlled rate  3. Essential hypertension  Controlled  4. Long term current use of anticoagulant therapy  No bleeding complications  5. Syncope and collapse  No recurrence  6. Transient cerebral ischemia, unspecified transient cerebral ischemia type  No new episodes    Current medicines are reviewed at length with the patient today.  The patient does not have  concerns regarding medicines.  The following changes have been made:  no change  Labs/ tests ordered today include:  No orders of the defined types were placed in this encounter.     Disposition:   FU with HS in 1 year  Signed, Sinclair Grooms, MD  03/04/2015 4:15 PM    [address], [place], [place]  [address] Phone: [phone]; Fax: [phone]

## 2015-03-05 ENCOUNTER — Encounter (HOSPITAL_COMMUNITY): Payer: Self-pay

## 2015-03-05 ENCOUNTER — Ambulatory Visit (INDEPENDENT_AMBULATORY_CARE_PROVIDER_SITE_OTHER): Payer: Medicare Other | Admitting: Interventional Cardiology

## 2015-03-05 ENCOUNTER — Other Ambulatory Visit: Payer: Self-pay | Admitting: *Deleted

## 2015-03-05 ENCOUNTER — Encounter: Payer: Self-pay | Admitting: Interventional Cardiology

## 2015-03-05 VITALS — BP 126/72 | HR 58 | Ht 67.0 in | Wt 187.0 lb

## 2015-03-05 DIAGNOSIS — I10 Essential (primary) hypertension: Secondary | ICD-10-CM

## 2015-03-05 DIAGNOSIS — R55 Syncope and collapse: Secondary | ICD-10-CM

## 2015-03-05 DIAGNOSIS — I2510 Atherosclerotic heart disease of native coronary artery without angina pectoris: Secondary | ICD-10-CM

## 2015-03-05 DIAGNOSIS — I4891 Unspecified atrial fibrillation: Secondary | ICD-10-CM | POA: Diagnosis not present

## 2015-03-05 DIAGNOSIS — G459 Transient cerebral ischemic attack, unspecified: Secondary | ICD-10-CM

## 2015-03-05 DIAGNOSIS — Z7901 Long term (current) use of anticoagulants: Secondary | ICD-10-CM

## 2015-03-05 NOTE — Patient Instructions (Signed)
Medication Instructions:  Your physician recommends that you continue on your current medications as directed. Please refer to the Current Medication list given to you today.   Labwork: None   Testing/Procedures: None   Follow-Up: Your physician wants you to follow-up in: 9 months with Dr.Smith You will receive a reminder letter in the mail two months in advance. If you don't receive a letter, please call our office to schedule the follow-up appointment.   Any Other Special Instructions Will Be Listed Below (If Applicable). We will refer you to [facility] Cardiac re[facility] program)

## 2015-03-07 ENCOUNTER — Encounter (HOSPITAL_COMMUNITY): Payer: Self-pay

## 2015-04-02 ENCOUNTER — Encounter (HOSPITAL_COMMUNITY): Payer: Self-pay | Attending: Interventional Cardiology

## 2015-04-04 ENCOUNTER — Encounter (HOSPITAL_COMMUNITY): Payer: Self-pay

## 2015-04-06 ENCOUNTER — Encounter (HOSPITAL_COMMUNITY): Payer: Self-pay

## 2015-04-09 ENCOUNTER — Encounter (HOSPITAL_COMMUNITY): Payer: Self-pay | Attending: Interventional Cardiology

## 2015-04-11 ENCOUNTER — Encounter (HOSPITAL_COMMUNITY): Payer: Self-pay

## 2015-04-13 ENCOUNTER — Encounter (HOSPITAL_COMMUNITY): Payer: Self-pay

## 2015-04-16 ENCOUNTER — Encounter (HOSPITAL_COMMUNITY): Payer: Self-pay

## 2015-04-16 DIAGNOSIS — G4730 Sleep apnea, unspecified: Secondary | ICD-10-CM | POA: Diagnosis not present

## 2015-04-16 DIAGNOSIS — R55 Syncope and collapse: Secondary | ICD-10-CM | POA: Diagnosis not present

## 2015-04-16 DIAGNOSIS — I10 Essential (primary) hypertension: Secondary | ICD-10-CM | POA: Diagnosis not present

## 2015-04-16 DIAGNOSIS — E785 Hyperlipidemia, unspecified: Secondary | ICD-10-CM | POA: Diagnosis not present

## 2015-04-16 DIAGNOSIS — Z0001 Encounter for general adult medical examination with abnormal findings: Secondary | ICD-10-CM | POA: Diagnosis not present

## 2015-04-16 DIAGNOSIS — I480 Paroxysmal atrial fibrillation: Secondary | ICD-10-CM | POA: Diagnosis not present

## 2015-04-16 DIAGNOSIS — I639 Cerebral infarction, unspecified: Secondary | ICD-10-CM | POA: Diagnosis not present

## 2015-04-16 DIAGNOSIS — F0390 Unspecified dementia without behavioral disturbance: Secondary | ICD-10-CM | POA: Diagnosis not present

## 2015-04-16 DIAGNOSIS — Z23 Encounter for immunization: Secondary | ICD-10-CM | POA: Diagnosis not present

## 2015-04-16 DIAGNOSIS — N183 Chronic kidney disease, stage 3 (moderate): Secondary | ICD-10-CM | POA: Diagnosis not present

## 2015-04-16 DIAGNOSIS — Z1389 Encounter for screening for other disorder: Secondary | ICD-10-CM | POA: Diagnosis not present

## 2015-04-18 ENCOUNTER — Encounter (HOSPITAL_COMMUNITY): Payer: Self-pay

## 2015-04-20 ENCOUNTER — Encounter (HOSPITAL_COMMUNITY): Payer: Self-pay

## 2015-04-23 ENCOUNTER — Encounter (HOSPITAL_COMMUNITY): Payer: Self-pay

## 2015-04-25 ENCOUNTER — Encounter (HOSPITAL_COMMUNITY): Payer: Self-pay

## 2015-04-26 ENCOUNTER — Other Ambulatory Visit: Payer: Self-pay | Admitting: Neurology

## 2015-04-27 ENCOUNTER — Encounter (HOSPITAL_COMMUNITY): Payer: Self-pay

## 2015-04-30 ENCOUNTER — Encounter (HOSPITAL_COMMUNITY): Payer: Self-pay

## 2015-05-02 ENCOUNTER — Encounter (HOSPITAL_COMMUNITY): Payer: Self-pay

## 2015-05-04 ENCOUNTER — Encounter (HOSPITAL_COMMUNITY): Payer: Self-pay

## 2015-05-07 ENCOUNTER — Encounter (HOSPITAL_COMMUNITY): Payer: Self-pay

## 2015-05-09 ENCOUNTER — Encounter (HOSPITAL_COMMUNITY): Payer: Self-pay

## 2015-05-11 ENCOUNTER — Encounter (HOSPITAL_COMMUNITY): Payer: Self-pay

## 2015-05-16 ENCOUNTER — Encounter (HOSPITAL_COMMUNITY): Payer: Self-pay

## 2015-05-18 ENCOUNTER — Encounter (HOSPITAL_COMMUNITY): Payer: Self-pay

## 2015-05-21 ENCOUNTER — Encounter (HOSPITAL_COMMUNITY): Payer: Self-pay

## 2015-05-23 ENCOUNTER — Encounter (HOSPITAL_COMMUNITY): Payer: Self-pay

## 2015-05-25 ENCOUNTER — Encounter (HOSPITAL_COMMUNITY): Payer: Self-pay

## 2015-05-28 ENCOUNTER — Encounter (HOSPITAL_COMMUNITY): Payer: Self-pay

## 2015-05-30 ENCOUNTER — Encounter (HOSPITAL_COMMUNITY): Payer: Self-pay

## 2015-06-01 ENCOUNTER — Encounter (HOSPITAL_COMMUNITY): Payer: Self-pay

## 2015-06-04 ENCOUNTER — Encounter (HOSPITAL_COMMUNITY): Payer: Self-pay

## 2015-06-06 ENCOUNTER — Encounter (HOSPITAL_COMMUNITY): Payer: Self-pay

## 2015-06-08 ENCOUNTER — Encounter (HOSPITAL_COMMUNITY): Payer: Self-pay

## 2015-06-11 ENCOUNTER — Encounter (HOSPITAL_COMMUNITY): Payer: Self-pay

## 2015-06-13 ENCOUNTER — Encounter (HOSPITAL_COMMUNITY): Payer: Self-pay

## 2015-06-15 ENCOUNTER — Encounter (HOSPITAL_COMMUNITY): Payer: Self-pay

## 2015-06-18 ENCOUNTER — Encounter (HOSPITAL_COMMUNITY): Payer: Self-pay

## 2015-06-20 ENCOUNTER — Encounter (HOSPITAL_COMMUNITY): Payer: Self-pay

## 2015-06-22 ENCOUNTER — Encounter (HOSPITAL_COMMUNITY): Payer: Self-pay

## 2015-06-25 ENCOUNTER — Encounter (HOSPITAL_COMMUNITY): Payer: Self-pay

## 2015-06-27 ENCOUNTER — Encounter (HOSPITAL_COMMUNITY): Payer: Self-pay

## 2015-06-27 ENCOUNTER — Other Ambulatory Visit: Payer: Self-pay | Admitting: Interventional Cardiology

## 2015-06-29 ENCOUNTER — Encounter (HOSPITAL_COMMUNITY): Payer: Self-pay

## 2015-07-02 ENCOUNTER — Encounter (HOSPITAL_COMMUNITY): Payer: Self-pay

## 2015-07-04 ENCOUNTER — Encounter (HOSPITAL_COMMUNITY): Payer: Self-pay

## 2015-07-06 ENCOUNTER — Encounter (HOSPITAL_COMMUNITY): Payer: Self-pay

## 2015-07-09 ENCOUNTER — Encounter (HOSPITAL_COMMUNITY): Payer: Self-pay

## 2015-07-11 ENCOUNTER — Encounter (HOSPITAL_COMMUNITY): Payer: Self-pay

## 2015-07-13 ENCOUNTER — Encounter (HOSPITAL_COMMUNITY): Payer: Self-pay

## 2015-07-16 ENCOUNTER — Encounter (HOSPITAL_COMMUNITY): Payer: Self-pay

## 2015-07-18 ENCOUNTER — Encounter (HOSPITAL_COMMUNITY): Payer: Self-pay

## 2015-07-20 ENCOUNTER — Encounter (HOSPITAL_COMMUNITY): Payer: Self-pay

## 2015-07-23 ENCOUNTER — Encounter (HOSPITAL_COMMUNITY): Payer: Self-pay

## 2015-07-25 ENCOUNTER — Encounter (HOSPITAL_COMMUNITY): Payer: Self-pay

## 2015-07-27 ENCOUNTER — Encounter (HOSPITAL_COMMUNITY): Payer: Self-pay

## 2015-07-28 ENCOUNTER — Other Ambulatory Visit: Payer: Self-pay | Admitting: Interventional Cardiology

## 2015-07-30 ENCOUNTER — Encounter (HOSPITAL_COMMUNITY): Payer: Self-pay

## 2015-08-01 ENCOUNTER — Encounter (HOSPITAL_COMMUNITY): Payer: Self-pay

## 2015-08-06 ENCOUNTER — Encounter (HOSPITAL_COMMUNITY): Payer: Self-pay

## 2015-08-08 ENCOUNTER — Encounter (HOSPITAL_COMMUNITY): Payer: Self-pay

## 2015-08-08 ENCOUNTER — Telehealth: Payer: Self-pay | Admitting: Neurology

## 2015-08-08 NOTE — Telephone Encounter (Signed)
Alternate phone number to reach Cyril Mourning today is [phone].

## 2015-08-08 NOTE — Telephone Encounter (Signed)
I spoke to Hudson, grand-daughter to pt.   Feels like memory worsening.  Has been off aricept for several months.  Made appt 08-09-15 at 0900, be here 0845 for MMSE.  Cyril Mourning will come with them.

## 2015-08-08 NOTE — Telephone Encounter (Signed)
Granddaughter Derek [name] called to schedule appointment with Dr. Jannifer Franklin, states patient has been out of Aricept for a few months and has noticed changes, while saying the Thanksgiving blessing he ended blessing with "ok, thanks bye" like a phone call, really struggling to find his words, patient called daughter Derek [name] and said "I'm having a medical emergency, I'm glad I found your number", granddaughter advised to sit down and take nitroglycerin and patient said ok, I'm going to take it, she asked if he was feeling bad and he told her "no, I feel fine" that is when Derek [name] decided to call our office. I advised Dr. Jannifer Franklin is booked out until end of January, Kristen would like to get patient in soon.

## 2015-08-09 ENCOUNTER — Encounter: Payer: Self-pay | Admitting: Nurse Practitioner

## 2015-08-09 ENCOUNTER — Ambulatory Visit (INDEPENDENT_AMBULATORY_CARE_PROVIDER_SITE_OTHER): Payer: Medicare Other | Admitting: Nurse Practitioner

## 2015-08-09 ENCOUNTER — Other Ambulatory Visit: Payer: Self-pay | Admitting: Interventional Cardiology

## 2015-08-09 VITALS — BP 166/80 | HR 64 | Ht 68.25 in | Wt 196.0 lb

## 2015-08-09 DIAGNOSIS — I2510 Atherosclerotic heart disease of native coronary artery without angina pectoris: Secondary | ICD-10-CM | POA: Diagnosis not present

## 2015-08-09 DIAGNOSIS — Z8673 Personal history of transient ischemic attack (TIA), and cerebral infarction without residual deficits: Secondary | ICD-10-CM

## 2015-08-09 DIAGNOSIS — G459 Transient cerebral ischemic attack, unspecified: Secondary | ICD-10-CM

## 2015-08-09 DIAGNOSIS — F0390 Unspecified dementia without behavioral disturbance: Secondary | ICD-10-CM | POA: Diagnosis not present

## 2015-08-09 MED ORDER — DONEPEZIL HCL 10 MG PO TABS: ORAL_TABLET | ORAL | Status: DC

## 2015-08-09 NOTE — Patient Instructions (Signed)
Restart Aricept 5 mg daily for one month then increase to 2 tabs daily Use CPAP every night F/U in 6 months

## 2015-08-09 NOTE — Progress Notes (Addendum)
[facility]  PATIENT: Derek [name] DOB: [birth year]   REASON FOR VISIT: Follow-up for dementia, transient cerebral ischemia, history of CVA HISTORY FROM: Patient and granddaughter    HISTORY OF PRESENT ILLNESS:Mr.Littrel, 81 year old male returns for followup. He was last seen 03/30/2014. 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 He also has sleep apnea and follows up with Dr. Brett Fairy. He says today he has not used his CPAP in some time. He does not like his current mask. He is with his granddaughter  today and he denies further TIA episodes . He received a cardiac stent in November 2014. He was in cardiac rehabilitation. His memory has worsened over the last several months he has not been taking his Aricept. He lives with his son who oversees his medications however this must have been in oversight. He no longer drives.  Denies any falls in the last year. Independent with feeding dressing and bathing. He returns for reevaluation.    HISTORY: of a progressive memory disturbance. The patient was last seen in October of 2013, and he was in general doing well at that time on Aricept. The patient is still operating a motor vehicle, but he has limited his driving to very short distances. The patient was admitted to the hospital in March of 2014 with an episode of syncope. The patient was felt to have had a brief episode of cardiac arrest, requiring initiation of CPR. The patient underwent a cardiology evaluation, and the family indicates that he also had a cerebrovascular evaluation. The patient was placed on a CardioNet monitor for 30 days, and atrial fibrillation was noted to occur intermittently. The patient has since been on Eliquis as an anticoagulant medication. The patient has not had any further  syncopal episodes. His cardiologist, Dr. Pernell Dupre has indicated that he should not be driving for 6 months. The patient returns for an evaluation. In June of 2014, the patient had fallen asleep outside on a hot day. The patient was noted to have a transient episode lasting about 10 minutes of garbled speech without associated facial asymmetry, numbness, or weakness of the extremities. This fully resolved. EMS was called, but the patient never went to the hospital. The patient comes to this office for an evaluation.     REVIEW OF SYSTEMS: Full 14 system review of systems performed and notable only for those listed, all others are neg:  Constitutional: neg  Cardiovascular: neg Ear/Nose/Throat: neg  Skin: neg Eyes: neg Respiratory: neg Gastroitestinal: neg  Hematology/Lymphatic: neg  Endocrine: neg Musculoskeletal:neg Allergy/Immunology: neg Neurological: Memory loss Psychiatric: neg Sleep : Daytime sleepiness, not currently not using CPAP   ALLERGIES: Allergies  Allergen Reactions  . Augmentin [Amoxicillin-Pot Clavulanate] Rash  . Penicillins Rash  . Sulfa Antibiotics Rash    HOME MEDICATIONS: Outpatient Prescriptions Prior to Visit  Medication Sig Dispense Refill  . amLODipine (NORVASC) 10 MG tablet Take 10 mg by mouth daily.    Marland Kitchen atorvastatin (LIPITOR) 40 MG tablet Take 1 tablet (40 mg total) by mouth daily at 6 PM. 90 tablet 2  . cholecalciferol (VITAMIN D) 1000 UNITS tablet Take 1,000 Units by mouth daily.    . Coenzyme Q10 (COQ10) 100 MG CAPS Take 100 mg by mouth daily.     Marland Kitchen ELIQUIS 2.5 MG TABS tablet TAKE  1 TABLET TWICE DAILY. 180 tablet 1  . isosorbide mononitrate (IMDUR) 30 MG 24 hr tablet TAKE 1 TABLET ONCE DAILY. 90 tablet 0  . losartan (COZAAR) 100 MG tablet Take 100 mg by mouth daily.  2  . metoprolol tartrate (LOPRESSOR) 25 MG tablet TAKE 1 TABLET TWICE DAILY. 180 tablet 0  . Multiple Vitamins-Minerals (CENTRUM SILVER PO) Take 1 tablet by mouth daily.     .  Multiple Vitamins-Minerals (PRESERVISION AREDS 2) CAPS Take 2 capsules by mouth 2 (two) times daily.    Marland Kitchen NITROSTAT 0.4 MG SL tablet DISSOLVE 1 TABLET UNDER TONGUE AS NEEDED FOR CHEST PAIN,MAY REPEAT IN5 MINUTES FOR 2 DOSES. 25 tablet 0  . Olopatadine HCl 0.2 % SOLN Place 1 drop into both eyes daily.    Marland Kitchen omeprazole (PRILOSEC) 20 MG capsule Take 20 mg by mouth daily.    Marland Kitchen donepezil (ARICEPT) 10 MG tablet TAKE ONE TABLET AT BEDTIME. (Patient not taking: Reported on 08/09/2015) 15 tablet 0   No facility-administered medications prior to visit.    PAST MEDICAL HISTORY: Past Medical History  Diagnosis Date  . CVA (cerebral vascular accident) ([place])   . Broken shoulder   . Cerebrovascular disease     MRI/MRA in 2007 - 50% stenosis of supraclinoid ICA, left  . Shingles     left chest  . Compression fracture   . Hypertension   . PAF (paroxysmal atrial fibrillation) ([facility])     hx of NSTEMI in setting of AF with RVR 07/2013;  Eliquis for AC  . Clavicle fracture   . Syncope   . History of pneumonia   . Complex sleep apnea syndrome     adapt SV titration EEP 6 min 6 and max 15 cm water.   Marland Kitchen TIA (transient ischemic attack) 07/12/2013  . Coronary atherosclerosis of native coronary artery 07/12/2013    a. NSTEMI in setting of AF with RVR 11/14 >> BMS to CFX;  b. Nuclear (9/15):  High risk - apical lateral ischemia >> c. LHC (9/15):  pLAD 50%, D1 (smaller br 90%/larger br 80%, pDx 50-60%, dLAD 50%, prox-mid CFX stent ok, pOM1 and OM2 50-70% (jailed by stent), OM5 70-80%, mPDA 90% (CFX dsz unchanged from 2014), RCA diff dsz, EF 55%, mild ant-apical HK >>  Med Rx  . Hx of echocardiogram     a. Echo 11/14): EF 55-60%, no RWMA, grade 2 DD, mild aortic stenosis (mean 15 mmHg), mild LAE  . MI (myocardial infarction) ([place]) 07/2013  . Dementia     PAST SURGICAL HISTORY: Past Surgical History  Procedure Laterality Date  . Tonsillectomy    . Cataract extraction Bilateral   . Kyphoplasty    . Shoulder  surgery Left   . Left heart catheterization with coronary angiogram N/A 08/08/2013    Procedure: LEFT HEART CATHETERIZATION WITH CORONARY ANGIOGRAM;  Surgeon: Jettie Booze, MD;  Location: [facility] CATH LAB;  Service: Cardiovascular;  Laterality: N/A;  . Percutaneous stent intervention  08/08/2013    Procedure: PERCUTANEOUS STENT INTERVENTION;  Surgeon: Jettie Booze, MD;  Location: [facility] CATH LAB;  Service: Cardiovascular;;  . Left heart catheterization with coronary angiogram N/A 05/29/2014    Procedure: LEFT HEART CATHETERIZATION WITH CORONARY ANGIOGRAM;  Surgeon: Sinclair Grooms, MD;  Location: [facility] CATH LAB;  Service: Cardiovascular;  Laterality: N/A;    FAMILY HISTORY: Family History  Problem Relation Age of Onset  . Diabetes Mother   . Diabetes Father   . Cancer - Lung Brother   .  Gait disorder Brother   . Cancer Daughter   . Heart attack Neg Hx   . Stroke Sister     SOCIAL HISTORY: Social History   Social History  . Marital Status: Widowed    Spouse Name: N/A  . Number of Children: 2  . Years of Education: CPA   Occupational History  . retired    Social History Main Topics  . Smoking status: Never Smoker   . Smokeless tobacco: Never Used  . Alcohol Use: 0.6 oz/week    1 Glasses of wine per week     Comment: occasionally  . Drug Use: No  . Sexual Activity: Not on file   Other Topics Concern  . Not on file   Social History Narrative   Patient is widowed [name]), has 2 children   Patient is right handed   Education level is 4 year degree.   Caffeine consumption is 2-4 cups daily     PHYSICAL EXAM  Filed Vitals:   08/09/15 0852  BP: 166/80  Pulse: 64  Height: 5' 8.25" (1.734 m)  Weight: 196 lb (88.905 kg)   Body mass index is 29.57 kg/(m^2). Generalized: Well developed, in no acute distress , disheveled appearance Neurological examination  Mentation: Alert , MMSE 22/30, missing items in orientation and 2 of  3 recall. AFT 5. Unable to draw  clock.  Last 28/30. Follows all commands speech and language fluent  Cranial nerve II-XII: Pupils were equal round reactive to light extraocular movements were full, visual field were full on confrontational test. Facial sensation and strength were normal. hearing was intact to finger rubbing bilaterally. Uvula tongue midline. head turning and shoulder shrug were normal and symmetric.Tongue protrusion into cheek strength was normal.  Motor: normal bulk and tone, full strength in the BUE, BLE, fine finger movements normal, no pronator drift. No focal weakness  Coordination: finger-nose-finger, heel-to-shin bilaterally, no dysmetria  Reflexes: Brachioradialis 2/2, biceps 2/2, triceps 2/2, patellar 2/2, Achilles 2/2, plantar responses were flexor bilaterally.  Gait and Station: Rising up from seated position without assistance, normal stance, moderate stride, good arm swing, smooth turning, able to perform tiptoe, and heel walking without difficulty. Romberg negative DIAGNOSTIC DATA (LABS, IMAGING, TESTING) - I reviewed patient records, labs, notes, testing and imaging myself where available.  Lab Results  Component Value Date   WBC 7.5 12/13/2014   HGB 14.2 12/13/2014   HCT 42.7 12/13/2014   MCV 85.7 12/13/2014   PLT 169 12/13/2014      Component Value Date/Time   NA 138 12/21/2014 1047   K 4.2 12/21/2014 1047   CL 103 12/21/2014 1047   CO2 29 12/21/2014 1047   GLUCOSE 106* 12/21/2014 1047   BUN 23 12/21/2014 1047   CREATININE 1.50 12/21/2014 1047   CALCIUM 9.9 12/21/2014 1047   PROT 6.4 12/13/2014 0545   ALBUMIN 3.5 12/13/2014 0545   AST 26 12/13/2014 0545   ALT 24 12/13/2014 0545   ALKPHOS 71 12/13/2014 0545   BILITOT 0.6 12/13/2014 0545   GFRNONAA 51* 12/13/2014 0545   GFRAA 59* 12/13/2014 0545   Lab Results  Component Value Date   CHOL 100 12/27/2014   HDL 29.50* 12/27/2014   LDLCALC 57 12/27/2014   TRIG 67.0 12/27/2014   CHOLHDL 3 12/27/2014   Lab Results    Component Value Date   HGBA1C 6.3* 12/11/2014    Lab Results  Component Value Date   TSH 0.902 12/11/2014      ASSESSMENT AND PLAN  81 y.o. year old male  has a past medical history of CVA (cerebral vascular accident) ([place]);  Cerebrovascular disease;  Hypertension; PAF (paroxysmal atrial fibrillation) ([place]);  Complex sleep apnea syndrome; TIA (transient ischemic attack) (07/12/2013); Coronary atherosclerosis of native coronary artery (07/12/2013);MI (myocardial infarction) ([place]) (07/2013); and Dementia. here to follow up. The patient is a current patient of Dr. Jannifer Franklin  who is out of the office today . This note is sent to the work in doctor.     Restart Aricept 10mg  1/2 tab daily for one month then increase to 1 tab daily, discussed with the patient and granddaughter importance of staying on the medication and not stopping it.  Use CPAP every night, make F/U appt with Dr. Brett Fairy for equipment change. I explained in particular the risks and ramifications of untreated moderate to severe OSA, especially with respect to cardiovascular disease to the patient and the grandaughter including congestive heart failure, difficult to treat hypertension, cardiac arrhythmias, or stroke. Even type 2 diabetes has, in part, been linked to untreated OSA. Symptoms of untreated OSA include daytime sleepiness, memory problems, mood irritability and mood disorder such as depression and anxiety, lack of energy, as well as recurrent headaches, especially morning headaches. We talked about trying to maintain a healthy lifestyle in general, as well as the importance of weight control. I encouraged the patient to eat healthy, and keep well hydrated, to keep a scheduled bedtime and wake time routine, to not skip any meals and eat healthy snacks in between meals F/U in 6 monthsVst time 25 min Dennie Bible, [facility], [facility], [facility] [address] [place], [place] [address] [phone]  I reviewed the above note and documentation by the Nurse Practitioner and agree with the history, physical exam, assessment and plan as outlined above. I was immediately available for face-to-face consultation. Star Age, MD, PhD [facility])

## 2015-08-10 ENCOUNTER — Encounter (HOSPITAL_COMMUNITY): Payer: Self-pay

## 2015-08-13 ENCOUNTER — Encounter (HOSPITAL_COMMUNITY): Payer: Self-pay

## 2015-08-15 ENCOUNTER — Encounter (HOSPITAL_COMMUNITY): Payer: Self-pay

## 2015-08-17 ENCOUNTER — Encounter (HOSPITAL_COMMUNITY): Payer: Self-pay

## 2015-08-20 ENCOUNTER — Encounter (HOSPITAL_COMMUNITY): Payer: Self-pay

## 2015-08-22 ENCOUNTER — Encounter (HOSPITAL_COMMUNITY): Payer: Self-pay

## 2015-08-24 ENCOUNTER — Encounter (HOSPITAL_COMMUNITY): Payer: Self-pay

## 2015-08-27 ENCOUNTER — Encounter (HOSPITAL_COMMUNITY): Payer: Self-pay

## 2015-08-29 ENCOUNTER — Ambulatory Visit (INDEPENDENT_AMBULATORY_CARE_PROVIDER_SITE_OTHER): Payer: Medicare Other | Admitting: Neurology

## 2015-08-29 ENCOUNTER — Encounter: Payer: Self-pay | Admitting: Neurology

## 2015-08-29 ENCOUNTER — Encounter (HOSPITAL_COMMUNITY): Payer: Self-pay

## 2015-08-29 VITALS — BP 132/78 | HR 66 | Resp 20 | Ht 68.0 in | Wt 193.0 lb

## 2015-08-29 DIAGNOSIS — G4731 Primary central sleep apnea: Secondary | ICD-10-CM | POA: Diagnosis not present

## 2015-08-29 DIAGNOSIS — F0150 Vascular dementia without behavioral disturbance: Secondary | ICD-10-CM

## 2015-08-29 DIAGNOSIS — I2510 Atherosclerotic heart disease of native coronary artery without angina pectoris: Secondary | ICD-10-CM

## 2015-08-29 NOTE — Progress Notes (Signed)
[facility]  Provider:  Dr Klani [name] Referring Provider: Seward Carol, MD  Primary Care Physician:  Derek Hams, MD   Derek [name] patient with complex apnea. :  HPI:  Derek [name] [name] a 81 y.o. male here as a referral from Derek. Delfina [name] for Derek [name] [name] a meanwhile 81 year old right-handed Caucasian male who presents today in the presence of his daughter.  He [name] seen at [facility]  by Derek [name] .  The patient had undergone a CPAP titration on 03-30-13. He was placed on an adapt SV at 5 cm water pressure  and with an expiratory pressure with a minimum pressure of 6 cm and a maximum pressure of 15 cm water  He also was fitted with a nasal mouth or chin strap to prevent all air old floor at the time.  The patient understood the compliance requirements very well also that it [name] probably not possible to eliminate all apnea by using ASB on the background of his complex and mixed apnea type.  A download was reviewed here today,  which [name] dated 06/09/2013 and had been provided by advanced on care it shows a residual AHI of 4.8 and the patient's maximum pressure support was able to be reduced to 10 cm water after review with. The patient had 83% compliance.  He report his machine [name] gargling and the pressure feels reduced.  He will need the machine to be checked out at [facility], he has noted that the machine's water reservoir seems not to decrease either. He  assured me that he had not manipulated any settings. This [name] a development over the last 2-3 weeks.  He still had high air leaks. He used the machine nightly for 5 hours and 57 minutes.   History of sleep apnea evaluation , complex sleep apnea.: The patient underwent an adapt  SV titration on 08/15/2005 this after a CPAP and BiPAP titration had failed ( 07-15-05). The patient had continued oxygen desaturations and central apneas , while  the obstructive apneas were resolved at baseline I was concerned  his apnea type was more  complex and  not straightforward obstructive apnea.  The patient reached an AHI of 0.9 during the adductus the titration he was bradycardic several times at night as it was tablet of 34. This EEP was 6 cm,  and [name] pressure support 5 cm minimum and 10 cm at maximum. At the time he was followed by Derek [name] .  His AHI was 52.3 and the preceding baseline study 27 of these apneas were obstructive and 270  central apneas . 02  nadir was 71% I was able today to review a download from the CPAP machine dated 02/11/2013 and forwarded to me from advanced on care and the foster.The machine's  memory function may not be as good anymore:  his median daily usage [name] indicated at 3 hours and 58 minutes, and  the mask leaks. There [name] a high peak inspiratory pressure of 18 cm water which I doubt a gentleman of his build would need. The download does not contain A H. I data.  During last he [name] visit with his primary I male he addressed, the patient was already placed on Aricept through his primary care physician at the time Derek [name]. His Mini-Mental status short 28/30 points the patient was able to name 13 animals in 30 seconds he had an entirely nonfocal neurologic exam. He had multiple strokes he reported, but no lasting impairment physically. Last TIA  was march 28 th 2014.  He was worked up  at [employer] and diagnosed  with atrial  fib.   The patient tries to go to bed at 9:00 he often falls asleep watching TV much earlier on his living room couch. Takes  multiple , unplanned  naps during the day, he rises between 5 and 6 AM, he estimates his hours of sleep at night about 6 hours, and he has multiple bathroom breaks, 4-5 times.   The patient denies any skull or facial bone surgeries nasal septum correction [name] no retrognathia and nor dental device or retainer for the treatment of snoring or apnea.  Snoring [name] loud, even with the machine.  There [name] no family history of an inherited sleep disorder. His daughter snores, his 20  year old son [name] alcoholic.   [id number],  Derek [name], an 81 year old right-handed Caucasian male patient, [name] here today for a yearly revisit , addressing the CPAP compliance. He did not bring the CPAP machine and I do not have a Fish farm manager available. He has not been able to use the machine. He feels he sleeps better with out the machine than with CPAP. He has not been able to find a comfortable interface. It has been irritating to him to get entangled, but he has to go to the bathroom he has to adjust a lot of straps, and the due to his advancing cognitive dysfunction this has become a big problem I discussed today that I would offer him another interface if he wants to try but I would be very much okay with discontinuing CPAP with my medical approval. He has not used the machine in almost a year.   Review of Systems: Out of a complete 14 system review, the patient complains of only the following symptoms, and all other reviewed systems are negative. Epworth 12 points, FSS 28, GDS 2 .   Social History   Social History  . Marital Status: Widowed    Spouse Name: N/A  . Number of Children: 2  . Years of Education: CPA   Occupational History  . retired    Social History Main Topics  . Smoking status: Never Smoker   . Smokeless tobacco: Never Used  . Alcohol Use: 0.6 oz/week    1 Glasses of wine per week     Comment: occasionally  . Drug Use: No  . Sexual Activity: Not on file   Other Topics Concern  . Not on file   Social History Narrative   Patient [name] widowed Derek [name]), has 2 children   Patient [name] right handed   Education level [name] 4 year degree.   Caffeine consumption [name] 2-4 cups daily    Family History  Problem Relation Age of Onset  . Diabetes Mother   . Diabetes Father   . Cancer - Lung Brother   . Gait disorder Brother   . Cancer Daughter   . Heart attack Neg Hx   . Stroke Sister     Past Medical History  Diagnosis Date  . CVA (cerebral vascular  accident) ([place])   . Broken shoulder   . Cerebrovascular disease     MRI/MRA in 2007 - 50% stenosis of supraclinoid ICA, left  . Shingles     left chest  . Compression fracture   . Hypertension   . PAF (paroxysmal atrial fibrillation) ([facility])     hx of NSTEMI in setting of AF with RVR 07/2013;  Eliquis for AC  . Clavicle  fracture   . Syncope   . History of pneumonia   . Complex sleep apnea syndrome     adapt SV titration EEP 6 min 6 and max 15 cm water.   Marland Kitchen TIA (transient ischemic attack) 07/12/2013  . Coronary atherosclerosis of native coronary artery 07/12/2013    a. NSTEMI in setting of AF with RVR 11/14 >> BMS to CFX;  b. Nuclear (9/15):  High risk - apical lateral ischemia >> c. LHC (9/15):  pLAD 50%, D1 (smaller br 90%/larger br 80%, pDx 50-60%, dLAD 50%, prox-mid CFX stent ok, pOM1 and OM2 50-70% (jailed by stent), OM5 70-80%, mPDA 90% (CFX dsz unchanged from 2014), RCA diff dsz, EF 55%, mild ant-apical HK >>  Med Rx  . Hx of echocardiogram     a. Echo 11/14): EF 55-60%, no RWMA, grade 2 DD, mild aortic stenosis (mean 15 mmHg), mild LAE  . MI (myocardial infarction) ([place]) 07/2013  . Dementia     Past Surgical History  Procedure Laterality Date  . Tonsillectomy    . Cataract extraction Bilateral   . Kyphoplasty    . Shoulder surgery Left   . Left heart catheterization with coronary angiogram N/A 08/08/2013    Procedure: LEFT HEART CATHETERIZATION WITH CORONARY ANGIOGRAM;  Surgeon: Jettie Booze, MD;  Location: [facility] CATH LAB;  Service: Cardiovascular;  Laterality: N/A;  . Percutaneous stent intervention  08/08/2013    Procedure: PERCUTANEOUS STENT INTERVENTION;  Surgeon: Jettie Booze, MD;  Location: [facility] CATH LAB;  Service: Cardiovascular;;  . Left heart catheterization with coronary angiogram N/A 05/29/2014    Procedure: LEFT HEART CATHETERIZATION WITH CORONARY ANGIOGRAM;  Surgeon: Sinclair Grooms, MD;  Location: [facility] CATH LAB;  Service: Cardiovascular;  Laterality: N/A;     Current Outpatient Prescriptions  Medication Sig Dispense Refill  . amLODipine (NORVASC) 10 MG tablet Take 10 mg by mouth daily.    Marland Kitchen apixaban (ELIQUIS) 2.5 MG TABS tablet Take 1 tablet (2.5 mg total) by mouth 2 (two) times daily. 180 tablet 1  . atorvastatin (LIPITOR) 40 MG tablet Take 1 tablet (40 mg total) by mouth daily at 6 PM. 90 tablet 2  . cholecalciferol (VITAMIN D) 1000 UNITS tablet Take 1,000 Units by mouth daily.    . Coenzyme Q10 (COQ10) 100 MG CAPS Take 100 mg by mouth daily.     Marland Kitchen donepezil (ARICEPT) 10 MG tablet 1/2  Tab for 30 days then 1 tab daily (pt has been off medication for some time) 45 tablet 6  . isosorbide mononitrate (IMDUR) 30 MG 24 hr tablet TAKE 1 TABLET ONCE DAILY. 90 tablet 0  . losartan (COZAAR) 100 MG tablet Take 100 mg by mouth daily.  2  . metoprolol tartrate (LOPRESSOR) 25 MG tablet TAKE 1 TABLET TWICE DAILY. 180 tablet 0  . Multiple Vitamins-Minerals (CENTRUM SILVER PO) Take 1 tablet by mouth daily.     . Multiple Vitamins-Minerals (PRESERVISION AREDS 2) CAPS Take 2 capsules by mouth 2 (two) times daily.    Marland Kitchen NITROSTAT 0.4 MG SL tablet DISSOLVE 1 TABLET UNDER TONGUE AS NEEDED FOR CHEST PAIN,MAY REPEAT IN5 MINUTES FOR 2 DOSES. 25 tablet 0  . Olopatadine HCl 0.2 % SOLN Place 1 drop into both eyes daily.    Marland Kitchen omeprazole (PRILOSEC) 20 MG capsule Take 20 mg by mouth daily.     No current facility-administered medications for this visit.    Allergies as of 08/29/2015 - Review Complete 08/29/2015  Allergen Reaction Noted  .  Augmentin [amoxicillin-pot clavulanate] Rash 03/03/2013  . Penicillins Rash 03/14/2012  . Sulfa antibiotics Rash 05/23/2014    Vitals: BP 132/78 mmHg  Pulse 66  Resp 20  Ht 5\' 8"  (1.727 m)  Wt 193 lb (87.544 kg)  BMI 29.35 kg/m2 Last Weight:  Wt Readings from Last 1 Encounters:  08/29/15 193 lb (87.544 kg)   Last Height:   Ht Readings from Last 1 Encounters:  08/29/15 5\' 8"  (1.727 m)     Physical exam:  General:  The patient [name] awake, alert and appears not in acute distress. The patient [name] well groomed. Head: Normocephalic, atraumatic. Neck [name] supple. Mallampati  3 ,  neck circumference: 15 inches , no septal deviation, no retrognathia.  Cardiovascular:  Regular rate and rhythm , with right carotid bruit, and without distended neck veins. Respiratory: Lungs are clear to auscultation. Skin:  Without evidence of edema, or rash Trunk:  normal posture.  Neurologic exam : The patient [name] awake and alert, oriented to place and time.   Memory subjective  described as impaired , but there [name] a normal attention span & concentration ability.  Speech [name] fluent without dysarthria, but  dysphonia .  Mood and affect are depressed.   Cranial nerves: Pupils are equal and briskly reactive to light. Funduscopic exam withevidence of pallor , not edema.  Extraocular movements  in vertical and horizontal planes intact and without nystagmus. Visual fields by finger perimetry are intact. Hearing to finger rub intact.  Facial sensation intact to fine touch. Facial motor strength [name] symmetric and tongue and uvula move midline.  Motor exam:   Normal tone and normal muscle bulk and symmetric normal strength in all extremities. Assessment:  After physical and neurologic examination, review of laboratory studies, sleep studies, neuro testing and pre-existing records,reviewed on the problem list.  Derek [name] will d/c CPAP use with my approval. He has not been able to use the CPAP over the last 12 month, he feels entangled, the function [name] difficult to adjust , the interface [name] poorly tolerated.  He has meanwhile suffered multiple CVAs, related to being diagnosed with atrial fibrillation and he had episodic congestive heart failure. Derek [name] has advancing vascular dementia.  He also has hypertension.  Plan:   More than 50% of our face-to-face time a dedicated to the coordination of care, discussing the necessity of care and  alternatives of the current therapies. I feel strongly that the benefit has been negligible and that Derek [name] by getting more sleep onset of  "fighting with the CPAP " he does not  Use. His his snoring has not been a concern to the patient he lives now with his son and as long as there [name] no other observation of worsening gasping for air be don't need to treat snoring as a condition. Cognitively declining 81 year old male , was not given aricept for several weeks, suffered faster decline after d/c.   unable to use CPAP and unable to tolerlate the machine and interface.   I will write an order to discontinue the CPAP. Derek [name] CPAP [name] probably paid for by now so he may not have to return any instrument he can do as he likes either keep it at home in case that other night needs arises or donated if he wants to. I think that he has given [place] plenty of evidence that feels he sleeps better and actually it has  been difficult for him to be compliant from  the very start.  Cc Derek [name] / Cecille Rubin NP , cognitive follow up.    Cassandra Harbold, MD

## 2015-08-31 ENCOUNTER — Encounter (HOSPITAL_COMMUNITY): Payer: Self-pay

## 2015-09-05 ENCOUNTER — Encounter (HOSPITAL_COMMUNITY): Payer: Self-pay

## 2015-09-07 ENCOUNTER — Encounter (HOSPITAL_COMMUNITY): Payer: Self-pay

## 2015-09-12 ENCOUNTER — Encounter (HOSPITAL_COMMUNITY): Payer: Self-pay

## 2015-09-14 ENCOUNTER — Encounter (HOSPITAL_COMMUNITY): Payer: Self-pay

## 2015-09-17 ENCOUNTER — Encounter (HOSPITAL_COMMUNITY): Payer: Self-pay

## 2015-09-19 ENCOUNTER — Encounter (HOSPITAL_COMMUNITY): Payer: Self-pay

## 2015-09-21 ENCOUNTER — Encounter (HOSPITAL_COMMUNITY): Payer: Self-pay

## 2015-09-24 ENCOUNTER — Encounter (HOSPITAL_COMMUNITY): Payer: Self-pay

## 2015-09-26 ENCOUNTER — Encounter (HOSPITAL_COMMUNITY): Payer: Self-pay

## 2015-09-28 ENCOUNTER — Encounter (HOSPITAL_COMMUNITY): Payer: Self-pay

## 2015-10-01 ENCOUNTER — Encounter (HOSPITAL_COMMUNITY): Payer: Self-pay

## 2015-10-02 DIAGNOSIS — H40053 Ocular hypertension, bilateral: Secondary | ICD-10-CM | POA: Diagnosis not present

## 2015-10-02 DIAGNOSIS — H353132 Nonexudative age-related macular degeneration, bilateral, intermediate dry stage: Secondary | ICD-10-CM | POA: Diagnosis not present

## 2015-10-02 DIAGNOSIS — Z961 Presence of intraocular lens: Secondary | ICD-10-CM | POA: Diagnosis not present

## 2015-10-03 ENCOUNTER — Encounter (HOSPITAL_COMMUNITY): Payer: Self-pay

## 2015-10-05 ENCOUNTER — Encounter (HOSPITAL_COMMUNITY): Payer: Self-pay

## 2015-10-08 ENCOUNTER — Encounter (HOSPITAL_COMMUNITY): Payer: Self-pay

## 2015-10-23 ENCOUNTER — Telehealth: Payer: Self-pay | Admitting: Nurse Practitioner

## 2015-10-23 NOTE — Telephone Encounter (Signed)
He has only been back on the Aricept full dose since January. He can make a follow up in March, but need to give the medicine time to work.since it was stopped then restarted. Marland Kitchen

## 2015-10-23 NOTE — Telephone Encounter (Signed)
Would you consider adding another medication, last seen 08/2015, or make appt to evaluate?

## 2015-10-23 NOTE — Telephone Encounter (Signed)
Pt's granddaughter Derek [name] called sts pt has increased donepezil (ARICEPT) 10 MG tablet up to 10mg  but he is progressively getting more confused. She said he called her mother the end of January and asked if she was coming to pick him up. She replied you don't have an appt and he said my reminder appt card said I have appt for the dentist for July 1995. She is inquiring if adding namaneda or another drug would be appropriate now.

## 2015-10-24 NOTE — Telephone Encounter (Signed)
Derek [name], called back.   Ok to see in March.   I made appt with CM for RV in march.  If needs to be changed will contact her again.

## 2015-10-24 NOTE — Telephone Encounter (Signed)
LMVM for Derek [name], grand-daughter, that since pt been off medication and just restarted aricept again that would hold off on another medication at this time and revisit in March. Please call back.

## 2015-10-24 NOTE — Telephone Encounter (Signed)
noted 

## 2015-10-25 ENCOUNTER — Encounter (INDEPENDENT_AMBULATORY_CARE_PROVIDER_SITE_OTHER): Payer: Self-pay

## 2015-11-01 ENCOUNTER — Other Ambulatory Visit: Payer: Self-pay | Admitting: Interventional Cardiology

## 2015-11-13 ENCOUNTER — Encounter: Payer: Self-pay | Admitting: Nurse Practitioner

## 2015-11-19 ENCOUNTER — Telehealth: Payer: Self-pay | Admitting: Neurology

## 2015-11-19 NOTE — Telephone Encounter (Signed)
Pt's granddaughter Cyril Mourning called said they will keep the appt in June with Dr Jannifer Franklin. She is inquiring if adding additional medication would benefit pt to help slow the progression down. Please call

## 2015-11-20 ENCOUNTER — Ambulatory Visit: Payer: Self-pay | Admitting: Nurse Practitioner

## 2015-11-20 NOTE — Telephone Encounter (Signed)
:  LFt vm for patients granddaughter Cyril Mourning about her fathers memory and the progression. Rn requested a call back to give her Carolyn(NP) advice.

## 2015-11-20 NOTE — Telephone Encounter (Signed)
When the patient was seen in December he had stopped Aricept and MMSE dropped from 28/30 to 22/30. The medication was restarted but unfortunately he probably will not regain what was lost. He will need followup to see where he is before starting another med.

## 2015-11-30 NOTE — Progress Notes (Signed)
Cardiology Office Note   Date:  12/06/2015   ID:  Derek [name], DOB Sep 03, 1927, MRN [id number]  PCP:  Kandice Hams, MD  Cardiologist:  Dr. Tamala Julian  9 month follow up    History of Present Illness: Derek [name] is a 82 y.o. male with a history of CAD s/p BMS to CFx (2014), PAF on Eliquis, embolic CVA, OSA non complaint with CPAP, mild AS, dementia and previous syncope who presents to clinic for regular cardiology follow up.   He was last seen by Dr. Tamala Julian in 02/2015 and felt to be doing well from a cardiac standpoint.  Today he presents to clinic for follow up. No chest pain or SOB. No dizziness, pre-syncope or syncope. No LE edema, orthopnea or PND. No blood in his stool or urine. Everyday he wakes up "feeling fabulous."  Past Medical History  Diagnosis Date  . CVA (cerebral vascular accident) ([place])   . Broken shoulder   . Cerebrovascular disease     MRI/MRA in 2007 - 50% stenosis of supraclinoid ICA, left  . Shingles     left chest  . Compression fracture   . Hypertension   . PAF (paroxysmal atrial fibrillation) ([facility])     hx of NSTEMI in setting of AF with RVR 07/2013;  Eliquis for AC  . Clavicle fracture   . Syncope   . History of pneumonia   . Complex sleep apnea syndrome     adapt SV titration EEP 6 min 6 and max 15 cm water.   Marland Kitchen TIA (transient ischemic attack) 07/12/2013  . Coronary atherosclerosis of native coronary artery 07/12/2013    a. NSTEMI in setting of AF with RVR 11/14 >> BMS to CFX;  b. Nuclear (9/15):  High risk - apical lateral ischemia >> c. LHC (9/15):  pLAD 50%, D1 (smaller br 90%/larger br 80%, pDx 50-60%, dLAD 50%, prox-mid CFX stent ok, pOM1 and OM2 50-70% (jailed by stent), OM5 70-80%, mPDA 90% (CFX dsz unchanged from 2014), RCA diff dsz, EF 55%, mild ant-apical HK >>  Med Rx  . Hx of echocardiogram     a. Echo 11/14): EF 55-60%, no RWMA, grade 2 DD, mild aortic stenosis (mean 15 mmHg), mild LAE  . MI (myocardial infarction) ([place]) 07/2013    . Dementia     Past Surgical History  Procedure Laterality Date  . Tonsillectomy    . Cataract extraction Bilateral   . Kyphoplasty    . Shoulder surgery Left   . Left heart catheterization with coronary angiogram N/A 08/08/2013    Procedure: LEFT HEART CATHETERIZATION WITH CORONARY ANGIOGRAM;  Surgeon: Jettie Booze, MD;  Location: [facility] C[facility] LAB;  Service: Cardiovascular;  Laterality: N/A;  . Percutaneous stent intervention  08/08/2013    Procedure: PERCUTANEOUS STENT INTERVENTION;  Surgeon: Jettie Booze, MD;  Location: [facility] CATH LAB;  Service: Cardiovascular;;  . Left heart catheterization with coronary angiogram N/A 05/29/2014    Procedure: LEFT HEART CATHETERIZATION WITH CORONARY ANGIOGRAM;  Surgeon: Sinclair Grooms, MD;  Location: [facility] CATH LAB;  Service: Cardiovascular;  Laterality: N/A;     Current Outpatient Prescriptions  Medication Sig Dispense Refill  . amLODipine (NORVASC) 10 MG tablet Take 10 mg by mouth daily.    Marland Kitchen apixaban (ELIQUIS) 2.5 MG TABS tablet Take 1 tablet (2.5 mg total) by mouth 2 (two) times daily. 180 tablet 1  . atorvastatin (LIPITOR) 40 MG tablet Take 1 tablet (40 mg total) by mouth daily at 6  PM. 90 tablet 2  . cholecalciferol (VITAMIN D) 1000 UNITS tablet Take 1,000 Units by mouth daily.    . Coenzyme Q10 (COQ10) 100 MG CAPS Take 100 mg by mouth daily.     Marland Kitchen donepezil (ARICEPT) 10 MG tablet 1/2  Tab for 30 days then 1 tab daily (pt has been off medication for some time) 45 tablet 6  . isosorbide mononitrate (IMDUR) 30 MG 24 hr tablet Take 1 tablet (30 mg total) by mouth daily. 90 tablet 0  . losartan (COZAAR) 100 MG tablet Take 100 mg by mouth daily.  2  . metoprolol tartrate (LOPRESSOR) 25 MG tablet Take 1 tablet (25 mg total) by mouth 2 (two) times daily. 180 tablet 0  . Multiple Vitamins-Minerals (CENTRUM SILVER PO) Take 1 tablet by mouth daily.     . Multiple Vitamins-Minerals (PRESERVISION AREDS 2) CAPS Take 2 capsules by mouth 2 (two) times  daily.    Marland Kitchen NITROSTAT 0.4 MG SL tablet DISSOLVE 1 TABLET UNDER TONGUE AS NEEDED FOR CHEST PAIN,MAY REPEAT IN5 MINUTES FOR 2 DOSES. 25 tablet 0  . Olopatadine HCl 0.2 % SOLN Place 1 drop into both eyes daily.    Marland Kitchen omeprazole (PRILOSEC) 20 MG capsule Take 20 mg by mouth daily.     No current facility-administered medications for this visit.    Allergies:   Augmentin; Penicillins; and Sulfa antibiotics    Social History:  The patient  reports that he has never smoked. He has never used smokeless tobacco. He reports that he drinks about 0.6 oz of alcohol per week. He reports that he does not use illicit drugs.   Family History:  The patient's family history includes Cancer in his daughter; Cancer - Lung in his brother; Diabetes in his father and mother; Gait disorder in his brother; Stroke in his sister. There is no history of Heart attack.    ROS:  Please see the history of present illness.   Otherwise, review of systems are positive for NONE.   All other systems are reviewed and negative.    PHYSICAL EXAM: VS:  BP 120/62 mmHg  Pulse 56  Ht 5\' 8"  (1.727 m)  Wt 193 lb 12.8 oz (87.907 kg)  BMI 29.47 kg/m2  SpO2 97% , BMI Body mass index is 29.47 kg/(m^2). GEN: Well nourished, well developed, in no acute distress HEENT: normal Neck: no JVD, carotid bruits, or masses Cardiac: RRR; 3/6 + SEM @ RUSB , rubs, or gallops,no edema  Respiratory:  clear to auscultation bilaterally, normal work of breathing GI: soft, nontender, nondistended, + BS MS: no deformity or atrophy Skin: warm and dry, no rash Neuro:  Strength and sensation are intact Psych: euthymic mood, full affect   EKG:  EKG is ordered today. The ekg ordered today demonstrates sinus bradycardia HR 55.    Recent Labs: 12/11/2014: Magnesium 1.9; TSH 0.902 12/13/2014: ALT 24; Hemoglobin 14.2; Platelets 169 12/21/2014: BUN 23; Creatinine, Ser 1.50; Potassium 4.2; Sodium 138    Lipid Panel    Component Value Date/Time   CHOL 100  12/27/2014 0916   TRIG 67.0 12/27/2014 0916   HDL 29.50* 12/27/2014 0916   CHOLHDL 3 12/27/2014 0916   VLDL 13.4 12/27/2014 0916   LDLCALC 57 12/27/2014 0916      Wt Readings from Last 3 Encounters:  12/06/15 193 lb 12.8 oz (87.907 kg)  08/29/15 193 lb (87.544 kg)  08/09/15 196 lb (88.905 kg)      Other studies Reviewed: Additional studies/ records that  were reviewed today include: 2D ECHO, myovew Review of the above records demonstrates:   2D ECHO: 12/13/2014 LV EF: 60- 65% Study Conclusion: - Left ventricle: The cavity size was normal. Wall thickness was increased in a pattern of moderate LVH. There was mild focal basal hypertrophy of the septum. Systolic function was normal. The estimated ejection fraction was in the range of 60% to 65%. Wall motion was normal; there were no regional wall motion abnormalities. Doppler parameters are consistent with abnormal left ventricular relaxation (grade 1 diastolic dysfunction). - Aortic valve: There was trivial regurgitation. Valve area (VTI): 2.97 cm^2. Valve area (Vmax): 2.57 cm^2. Valve area (Vmean): 2.54 cm^2. - Mitral valve: Calcified annulus. Mildly thickened leaflets .   Myoview 05/29/14 IMPRESSIONS: 1. Widely patent proximal LAD and circumflex. The circumflex stent is widely patent. 2. High-grade disease in the first diagonal, fifth obtuse marginal, and PDA (of circumflex origin) accounting for the high risk abnormality noted on perfusion scanning. 3. Diffusely diseased nondominant RCA 4. Preserved overall LV function with anteroapical hypokinesis  RECOMMENDATION: Medical therapy with the patient resuming Eliquis discontinuing antiplatelet therapy. Nitroglycerin for episodes of chest pain.           ASSESSMENT AND PLAN:  Derek [name] is a 82 y.o. male with a history of CAD s/p BMS to CFx (2014), PAF on Eliquis, embolic CVA, OSA non complaint with CPAP, mild AS, dementia and previous  syncope who presents to clinic for regular cardiology follow up.   CAD: stable. No ASA due to NOAC use. Continue BB and statin and long acting nitrates  PAF: stable. He remains in sinus bradycardia today. CHADSVASC score at least 6 (HTN, AGE, CVA, CAD). Continue Eliquis 2.5 mg BID.   Mild AS: last echo 12/2014 showed sclerosis without stenosis. He does have a 3/6 SEM. Will recheck 2D ECHO   HTN: Well controlled today 120/62. Continue current regimen    OSA: He is followed by Neurology (Dr. Brett Fairy).The CPAP was actually making his sleep worse, so she told him not even to bother wearing it.   Current medicines are reviewed at length with the patient today.  The patient does not have concerns regarding medicines.  The following changes have been made:  no change  Labs/ tests ordered today include:   Orders Placed This Encounter  Procedures  . EKG 12-Lead  . Echocardiogram     Disposition:   FU with Dr. Tamala Julian in 1 year or sooner if he has any problems  Signed, Crista Luria  12/06/2015 10:52 AM    [facility] [place], [place], [place]  [address] Phone: [phone]; Fax: [phone]

## 2015-12-06 ENCOUNTER — Ambulatory Visit (INDEPENDENT_AMBULATORY_CARE_PROVIDER_SITE_OTHER): Payer: Medicare Other | Admitting: Physician Assistant

## 2015-12-06 ENCOUNTER — Encounter: Payer: Self-pay | Admitting: Physician Assistant

## 2015-12-06 VITALS — BP 120/62 | HR 56 | Ht 68.0 in | Wt 193.8 lb

## 2015-12-06 DIAGNOSIS — I2510 Atherosclerotic heart disease of native coronary artery without angina pectoris: Secondary | ICD-10-CM | POA: Diagnosis not present

## 2015-12-06 DIAGNOSIS — I350 Nonrheumatic aortic (valve) stenosis: Secondary | ICD-10-CM

## 2015-12-06 DIAGNOSIS — I4891 Unspecified atrial fibrillation: Secondary | ICD-10-CM

## 2015-12-06 DIAGNOSIS — I10 Essential (primary) hypertension: Secondary | ICD-10-CM

## 2015-12-06 NOTE — Patient Instructions (Addendum)
Medication Instructions:  Your physician recommends that you continue on your current medications as directed. Please refer to the Current Medication list given to you today.   Labwork: None ordered  Testing/Procedures: Your physician has requested that you have an echocardiogram. Echocardiography is a painless test that uses sound waves to create images of your heart. It provides your doctor with information about the size and shape of your heart and how well your heart's chambers and valves are working. This procedure takes approximately one hour. There are no restrictions for this procedure.   Follow-Up: Your physician wants you to follow-up in: [place] Gaspar Bidding will receive a reminder letter in the mail two months in advance. If you don't receive a letter, please call our office to schedule the follow-up appointment.   Any Other Special Instructions Will Be Listed Below (If Applicable).     If you need a refill on your cardiac medications before your next appointment, please call your pharmacy.

## 2015-12-07 ENCOUNTER — Telehealth: Payer: Self-pay | Admitting: Interventional Cardiology

## 2015-12-07 DIAGNOSIS — R55 Syncope and collapse: Secondary | ICD-10-CM

## 2015-12-07 NOTE — Telephone Encounter (Signed)
I called and spoke with [name] (patient's daughter) and she had me speak with her brother who was with the patient when he had his near syncopal spell earlier today. Per the patient's son, he reports that the patient had finished eating and he had his head down. The patient's son could see that he was shaking, so he laid him on the floor. The patient was sweating and his eyes were "rolling back in his head." He was drooling a little bit. Per the patient's son, after about 10 minutes he was fine. He states that the patient did not pass out completely but was very close. He also reports that this is not the first time this has happened- last episode was ~ July when he was at the beach and had just eaten ( he was in a restaurant at the time), and prior to that was about 5-6 months before, again after eating.  I advised the patient son that I am unsure if he is having some shunting of blood to his gut after eating that could be dropping his BP. I advised that I will forward to Dr. Tamala Julian for any further recommendations, but also advised they call his neurologist for any recommendations. He is scheduled for a follow up echo on 12/20/15.

## 2015-12-07 NOTE — Telephone Encounter (Signed)
New message     Grand-daughter calling C/O patient passed out at [employer] today about  15 min .  Patient by APP on yesterday.

## 2015-12-12 NOTE — Telephone Encounter (Signed)
48 hour Holter monitor

## 2015-12-13 NOTE — Telephone Encounter (Signed)
Called to give pt daughter [name] Dr.Smith's response below. [email or web address]

## 2015-12-20 ENCOUNTER — Ambulatory Visit (HOSPITAL_COMMUNITY): Payer: Medicare Other | Attending: Cardiovascular Disease

## 2015-12-20 ENCOUNTER — Other Ambulatory Visit: Payer: Self-pay

## 2015-12-20 DIAGNOSIS — I340 Nonrheumatic mitral (valve) insufficiency: Secondary | ICD-10-CM | POA: Diagnosis not present

## 2015-12-20 DIAGNOSIS — E785 Hyperlipidemia, unspecified: Secondary | ICD-10-CM | POA: Diagnosis not present

## 2015-12-20 DIAGNOSIS — I119 Hypertensive heart disease without heart failure: Secondary | ICD-10-CM | POA: Insufficient documentation

## 2015-12-20 DIAGNOSIS — I071 Rheumatic tricuspid insufficiency: Secondary | ICD-10-CM | POA: Insufficient documentation

## 2015-12-20 DIAGNOSIS — G4733 Obstructive sleep apnea (adult) (pediatric): Secondary | ICD-10-CM | POA: Diagnosis not present

## 2015-12-20 DIAGNOSIS — I2510 Atherosclerotic heart disease of native coronary artery without angina pectoris: Secondary | ICD-10-CM | POA: Diagnosis not present

## 2015-12-20 DIAGNOSIS — I350 Nonrheumatic aortic (valve) stenosis: Secondary | ICD-10-CM | POA: Insufficient documentation

## 2015-12-20 NOTE — Telephone Encounter (Signed)
Pt came in today for his scheduled echo. Pt daughter [name] made aware of Dr.Smith's recommendation for the pt to wear a 48hr holter monitor. Pt agreeable with plan. They will stop at check out to have holter appt scheduled

## 2015-12-26 ENCOUNTER — Encounter: Payer: Self-pay | Admitting: Podiatry

## 2015-12-26 ENCOUNTER — Ambulatory Visit (INDEPENDENT_AMBULATORY_CARE_PROVIDER_SITE_OTHER): Payer: Medicare Other | Admitting: Podiatry

## 2015-12-26 ENCOUNTER — Other Ambulatory Visit: Payer: Self-pay | Admitting: Interventional Cardiology

## 2015-12-26 VITALS — BP 150/81 | HR 52 | Resp 14

## 2015-12-26 DIAGNOSIS — M79676 Pain in unspecified toe(s): Secondary | ICD-10-CM | POA: Diagnosis not present

## 2015-12-26 DIAGNOSIS — B351 Tinea unguium: Secondary | ICD-10-CM

## 2015-12-26 NOTE — Progress Notes (Signed)
Patient ID: JOSH SHURTLEFF, male   DOB: [birth year], 82 y.o.   MRN: [id number] Complaint:  Visit Type: Patient returns to my office for continued preventative foot care services. Complaint: Patient states" my nails have grown long and thick and become painful to walk and wear shoes" . The patient presents for preventative foot care services. No changes to ROS  Podiatric Exam: Vascular: dorsalis pedis and posterior tibial pulses are not  palpable bilateral. Capillary return is immediate. Temperature gradient is WNL. Skin turgor WNL  Sensorium: Normal Semmes Weinstein monofilament test. Normal tactile sensation bilaterally. Nail Exam: Pt has thick disfigured discolored nails with subungual debris noted bilateral entire nail hallux through fifth toenails Ulcer Exam: There is no evidence of ulcer or pre-ulcerative changes or infection. Orthopedic Exam: Muscle tone and strength are WNL. No limitations in general ROM. No crepitus or effusions noted. Foot type and digits show no abnormalities. Bony prominences are unremarkable. Skin: No Porokeratosis. No infection or ulcers  Diagnosis:  Onychomycosis, , Pain in right toe, pain in left toes  Treatment & Plan Procedures and Treatment: Consent by patient was obtained for treatment procedures. The patient understood the discussion of treatment and procedures well. All questions were answered thoroughly reviewed. Debridement of mycotic and hypertrophic toenails, 1 through 5 bilateral and clearing of subungual debris. No ulceration, no infection noted. Prescribed Miracle 3. Return Visit-Office Procedure: Patient instructed to return to the office for a follow up visit 10 weeks. for continued evaluation and treatment.    Gardiner Barefoot DPM

## 2015-12-26 NOTE — Progress Notes (Deleted)
   Subjective:    Patient ID: Derek [name], male    DOB: 10-Sep-1926, 82 y.o.   MRN: [id number]  HPI    Review of Systems  All other systems reviewed and are negative.      Objective:   Physical Exam        Assessment & Plan:

## 2015-12-27 ENCOUNTER — Ambulatory Visit (INDEPENDENT_AMBULATORY_CARE_PROVIDER_SITE_OTHER): Payer: Medicare Other

## 2015-12-27 DIAGNOSIS — R55 Syncope and collapse: Secondary | ICD-10-CM | POA: Diagnosis not present

## 2016-02-05 ENCOUNTER — Other Ambulatory Visit: Payer: Self-pay | Admitting: Interventional Cardiology

## 2016-02-12 ENCOUNTER — Encounter: Payer: Self-pay | Admitting: Neurology

## 2016-02-12 ENCOUNTER — Ambulatory Visit (INDEPENDENT_AMBULATORY_CARE_PROVIDER_SITE_OTHER): Payer: Medicare Other | Admitting: Neurology

## 2016-02-12 VITALS — BP 122/70 | HR 56 | Ht 68.0 in | Wt 192.5 lb

## 2016-02-12 DIAGNOSIS — I2510 Atherosclerotic heart disease of native coronary artery without angina pectoris: Secondary | ICD-10-CM | POA: Diagnosis not present

## 2016-02-12 DIAGNOSIS — F0390 Unspecified dementia without behavioral disturbance: Secondary | ICD-10-CM

## 2016-02-12 MED ORDER — MEMANTINE HCL 28 X 5 MG & 21 X 10 MG PO TABS: ORAL_TABLET | ORAL | Status: DC

## 2016-02-12 MED ORDER — DONEPEZIL HCL 10 MG PO TABS: 10.0000 mg | ORAL_TABLET | Freq: Every day | ORAL | Status: DC

## 2016-02-12 NOTE — Progress Notes (Signed)
Reason for visit: Memory disorder  Derek [name] is an 82 y.o. male  History of present illness:  Derek [name] is an 82 year old right-handed white male with a history of a progressive memory disturbance. The patient went off of Aricept, and had a sudden decline in his memory and mentation, he has not regained his level of cognitive functioning prior to coming off of Aricept. The patient is back on 10 mg at night, he is tolerating this medication well. The patient is not operating a motor vehicle at this time. He remains physically active. He has an irregular sleep pattern, he may stay up all night watching TV 1 night, and then sleep 12 hours the next. He is eating and drinking fairly well. Otherwise, he feels well. He returns to this office for an evaluation.  Past Medical History  Diagnosis Date  . CVA (cerebral vascular accident) ([place])   . Broken shoulder   . Cerebrovascular disease     MRI/MRA in 2007 - 50% stenosis of supraclinoid ICA, left  . Shingles     left chest  . Compression fracture   . Hypertension   . PAF (paroxysmal atrial fibrillation) ([facility])     hx of NSTEMI in setting of AF with RVR 07/2013;  Eliquis for AC  . Clavicle fracture   . Syncope   . History of pneumonia   . Complex sleep apnea syndrome     adapt SV titration EEP 6 min 6 and max 15 cm water.   Marland Kitchen TIA (transient ischemic attack) 07/12/2013  . Coronary atherosclerosis of native coronary artery 07/12/2013    a. NSTEMI in setting of AF with RVR 11/14 >> BMS to CFX;  b. Nuclear (9/15):  High risk - apical lateral ischemia >> c. LHC (9/15):  pLAD 50%, D1 (smaller br 90%/larger br 80%, pDx 50-60%, dLAD 50%, prox-mid CFX stent ok, pOM1 and OM2 50-70% (jailed by stent), OM5 70-80%, mPDA 90% (CFX dsz unchanged from 2014), RCA diff dsz, EF 55%, mild ant-apical HK >>  Med Rx  . Hx of echocardiogram     a. Echo 11/14): EF 55-60%, no RWMA, grade 2 DD, mild aortic stenosis (mean 15 mmHg), mild LAE  . MI (myocardial  infarction) ([place]) 07/2013  . Dementia     Past Surgical History  Procedure Laterality Date  . Tonsillectomy    . Cataract extraction Bilateral   . Kyphoplasty    . Shoulder surgery Left   . Left heart catheterization with coronary angiogram N/A 08/08/2013    Procedure: LEFT HEART CATHETERIZATION WITH CORONARY ANGIOGRAM;  Surgeon: Jettie Booze, MD;  Location: [facility] CATH LAB;  Service: Cardiovascular;  Laterality: N/A;  . Percutaneous stent intervention  08/08/2013    Procedure: PERCUTANEOUS STENT INTERVENTION;  Surgeon: Jettie Booze, MD;  Location[facility] C[facility] LAB;  Service: Cardiovascular;;  . Left heart catheterization with coronary angiogram N/A 05/29/2014    Procedure: LEFT HEART CATHETERIZATION WITH CORONARY ANGIOGRAM;  Surgeon: Sinclair Grooms, MD;  Location: [facility] CATH LAB;  Service: Cardiovascular;  Laterality: N/A;    Family History  Problem Relation Age of Onset  . Diabetes Mother   . Diabetes Father   . Cancer - Lung Brother   . Gait disorder Brother   . Cancer Daughter   . Heart attack Neg Hx   . Stroke Sister     Social history:  reports that he has never smoked. He has never used smokeless tobacco. He reports that he drinks  about 0.6 oz of alcohol per week. He reports that he does not use illicit drugs.    Allergies  Allergen Reactions  . Augmentin [Amoxicillin-Pot Clavulanate] Rash  . Penicillins Rash  . Sulfa Antibiotics Rash    Medications:  Prior to Admission medications   Medication Sig Start Date End Date Taking? Authorizing Provider  amLODipine (NORVASC) 10 MG tablet Take 10 mg by mouth daily.   Yes Historical Provider, MD  apixaban (ELIQUIS) 2.5 MG TABS tablet Take 1 tablet (2.5 mg total) by mouth 2 (two) times daily. 12/26/15  Yes Belva Crome, MD  atorvastatin (LIPITOR) 40 MG tablet Take 1 tablet (40 mg total) by mouth daily at 6 PM. 06/28/15  Yes Belva Crome, MD  cholecalciferol (VITAMIN D) 1000 UNITS tablet Take 1,000 Units by mouth daily.    Yes Historical Provider, MD  Coenzyme Q10 (COQ10) 100 MG CAPS Take 100 mg by mouth daily.    Yes Historical Provider, MD  donepezil (ARICEPT) 10 MG tablet 1/2  Tab for 30 days then 1 tab daily (pt has been off medication for some time) Patient taking differently: Take 10 mg by mouth daily.  08/09/15  Yes Dennie Bible, NP  isosorbide mononitrate (IMDUR) 30 MG 24 hr tablet TAKE 1 TABLET ONCE DAILY. 02/05/16  Yes Belva Crome, MD  losartan (COZAAR) 100 MG tablet Take 100 mg by mouth daily. 12/09/14  Yes Historical Provider, MD  metoprolol tartrate (LOPRESSOR) 25 MG tablet TAKE 1 TABLET TWICE DAILY. 02/05/16  Yes Belva Crome, MD  Multiple Vitamins-Minerals (CENTRUM SILVER PO) Take 1 tablet by mouth daily.    Yes Historical Provider, MD  Multiple Vitamins-Minerals (PRESERVISION AREDS 2) CAPS Take 1 capsule by mouth 2 (two) times daily.    Yes Historical Provider, MD  NITROSTAT 0.4 MG SL tablet DISSOLVE 1 TABLET UNDER TONGUE AS NEEDED FOR CHEST PAIN,MAY REPEAT IN5 MINUTES FOR 2 DOSES. 06/19/14  Yes Belva Crome, MD  Olopatadine HCl 0.2 % SOLN Place 1 drop into both eyes daily.   Yes Historical Provider, MD  omeprazole (PRILOSEC) 20 MG capsule Take 20 mg by mouth daily.   Yes Historical Provider, MD    ROS:  Out of a complete 14 system review of symptoms, the patient complains only of the following symptoms, and all other reviewed systems are negative.  Hearing loss Cold intolerance Memory loss History of syncope Occasional urinary incontinence Daytime sleepiness, snoring  Blood pressure 122/70, pulse 56, height 5\' 8"  (1.727 m), weight 192 lb 8 oz (87.317 kg).  Physical Exam  General: The patient is alert and cooperative at the time of the examination.  Skin: No significant peripheral edema is noted.   Neurologic Exam  Mental status: The patient is alert and oriented x 2 at the time of the examination (not oriented to date). The Mini-Mental Status Examination done today shows a  total score of 23/30.   Cranial nerves: Facial symmetry is present. Speech is normal, no aphasia or dysarthria is noted. Extraocular movements are full. Visual fields are full.  Motor: The patient has good strength in all 4 extremities.  Sensory examination: Soft touch sensation is symmetric on the face, arms, and legs.  Coordination: The patient has good finger-nose-finger and heel-to-shin bilaterally.  Gait and station: The patient has a normal gait. Tandem gait is unsteady. Romberg is negative. No drift is seen.  Reflexes: Deep tendon reflexes are symmetric.   Assessment/Plan:  1. Memory disturbance  The patient will continue  the Aricept for now, a prescription was called in. We will add Namenda to the current regimen, a Dosepak was called in, the family is to contact our office after one month if he is tolerating the medication, and I will call in a 90 day supply of this medication as a maintenance dose. Otherwise, he will follow-up in 7-8 months, sooner if needed.  Jill Alexanders MD 02/12/2016 7:22 PM  [facility] [address] [place], [place] [address]  Phone [phone] Fax [phone]

## 2016-03-12 ENCOUNTER — Ambulatory Visit: Payer: Medicare Other | Admitting: Podiatry

## 2016-03-31 ENCOUNTER — Other Ambulatory Visit: Payer: Self-pay | Admitting: *Deleted

## 2016-03-31 MED ORDER — MEMANTINE HCL 10 MG PO TABS: 10.0000 mg | ORAL_TABLET | Freq: Two times a day (BID) | ORAL | 11 refills | Status: DC

## 2016-04-22 DIAGNOSIS — N183 Chronic kidney disease, stage 3 (moderate): Secondary | ICD-10-CM | POA: Diagnosis not present

## 2016-04-22 DIAGNOSIS — F0390 Unspecified dementia without behavioral disturbance: Secondary | ICD-10-CM | POA: Diagnosis not present

## 2016-04-22 DIAGNOSIS — I639 Cerebral infarction, unspecified: Secondary | ICD-10-CM | POA: Diagnosis not present

## 2016-04-22 DIAGNOSIS — Z0000 Encounter for general adult medical examination without abnormal findings: Secondary | ICD-10-CM | POA: Diagnosis not present

## 2016-04-22 DIAGNOSIS — I350 Nonrheumatic aortic (valve) stenosis: Secondary | ICD-10-CM | POA: Diagnosis not present

## 2016-04-22 DIAGNOSIS — I10 Essential (primary) hypertension: Secondary | ICD-10-CM | POA: Diagnosis not present

## 2016-04-22 DIAGNOSIS — I480 Paroxysmal atrial fibrillation: Secondary | ICD-10-CM | POA: Diagnosis not present

## 2016-04-22 DIAGNOSIS — G4730 Sleep apnea, unspecified: Secondary | ICD-10-CM | POA: Diagnosis not present

## 2016-04-22 DIAGNOSIS — Z1389 Encounter for screening for other disorder: Secondary | ICD-10-CM | POA: Diagnosis not present

## 2016-04-23 ENCOUNTER — Encounter: Payer: Self-pay | Admitting: Podiatry

## 2016-04-23 ENCOUNTER — Ambulatory Visit (INDEPENDENT_AMBULATORY_CARE_PROVIDER_SITE_OTHER): Payer: Medicare Other | Admitting: Podiatry

## 2016-04-23 DIAGNOSIS — B351 Tinea unguium: Secondary | ICD-10-CM

## 2016-04-23 DIAGNOSIS — M79676 Pain in unspecified toe(s): Secondary | ICD-10-CM

## 2016-04-23 NOTE — Progress Notes (Signed)
Patient ID: Derek [name], male   DOB: [birth year], 82 y.o.   MRN: [id number] Complaint:  Visit Type: Patient returns to my office for continued preventative foot care services. Complaint: Patient states" my nails have grown long and thick and become painful to walk and wear shoes" . The patient presents for preventative foot care services. No changes to ROS  Podiatric Exam: Vascular: dorsalis pedis and posterior tibial pulses are not  palpable bilateral. Capillary return is immediate. Temperature gradient is WNL. Skin turgor WNL  Sensorium: Normal Semmes Weinstein monofilament test. Normal tactile sensation bilaterally. Nail Exam: Pt has thick disfigured discolored nails with subungual debris noted bilateral entire nail hallux through fifth toenails Ulcer Exam: There is no evidence of ulcer or pre-ulcerative changes or infection. Orthopedic Exam: Muscle tone and strength are WNL. No limitations in general ROM. No crepitus or effusions noted. Foot type and digits show no abnormalities. Bony prominences are unremarkable. Skin: No Porokeratosis. No infection or ulcers  Diagnosis:  Onychomycosis, , Pain in right toe, pain in left toes  Treatment & Plan Procedures and Treatment: Consent by patient was obtained for treatment procedures. The patient understood the discussion of treatment and procedures well. All questions were answered thoroughly reviewed. Debridement of mycotic and hypertrophic toenails, 1 through 5 bilateral and clearing of subungual debris. No ulceration, no infection noted. Prescribed Miracle 3. Return Visit-Office Procedure: Patient instructed to return to the office for a follow up visit 10 weeks. for continued evaluation and treatment.    Gardiner Barefoot DPM

## 2016-04-30 ENCOUNTER — Other Ambulatory Visit: Payer: Self-pay | Admitting: Interventional Cardiology

## 2016-05-01 NOTE — Telephone Encounter (Signed)
Please advise on request as patient does not have a recent lipid panel in epic.

## 2016-06-16 DIAGNOSIS — L821 Other seborrheic keratosis: Secondary | ICD-10-CM | POA: Diagnosis not present

## 2016-06-16 DIAGNOSIS — L858 Other specified epidermal thickening: Secondary | ICD-10-CM | POA: Diagnosis not present

## 2016-06-23 DIAGNOSIS — Z23 Encounter for immunization: Secondary | ICD-10-CM | POA: Diagnosis not present

## 2016-07-17 ENCOUNTER — Encounter: Payer: Self-pay | Admitting: Podiatry

## 2016-07-17 ENCOUNTER — Ambulatory Visit (INDEPENDENT_AMBULATORY_CARE_PROVIDER_SITE_OTHER): Payer: Medicare Other | Admitting: Podiatry

## 2016-07-17 VITALS — Ht 68.0 in | Wt 192.0 lb

## 2016-07-17 DIAGNOSIS — D2371 Other benign neoplasm of skin of right lower limb, including hip: Secondary | ICD-10-CM | POA: Diagnosis not present

## 2016-07-17 DIAGNOSIS — M79676 Pain in unspecified toe(s): Secondary | ICD-10-CM

## 2016-07-17 DIAGNOSIS — B351 Tinea unguium: Secondary | ICD-10-CM | POA: Diagnosis not present

## 2016-07-17 DIAGNOSIS — L989 Disorder of the skin and subcutaneous tissue, unspecified: Secondary | ICD-10-CM

## 2016-07-17 NOTE — Progress Notes (Signed)
Patient ID: Derek [name], male   DOB: 1927/06/15, 82 y.o.   MRN: [id number] Complaint:  Visit Type: Patient returns to my office for continued preventative foot care services. Complaint: Patient states" my nails have grown long and thick and become painful to walk and wear shoes" . The patient presents for preventative foot care services. No changes to ROS  Podiatric Exam: Vascular: dorsalis pedis and posterior tibial pulses are not  palpable bilateral. Capillary return is immediate. Temperature gradient is WNL. Skin turgor WNL  Sensorium: Normal Semmes Weinstein monofilament test. Normal tactile sensation bilaterally. Nail Exam: Pt has thick disfigured discolored nails with subungual debris noted bilateral entire nail hallux through fifth toenails Ulcer Exam: There is no evidence of ulcer or pre-ulcerative changes or infection. Orthopedic Exam: Muscle tone and strength are WNL. No limitations in general ROM. No crepitus or effusions noted. Foot type and digits show no abnormalities. Bony prominences are unremarkable. Skin: No Porokeratosis. No infection or ulcers.  Blackened irregular lesion on dorsum of left foot.  Diagnosis:  Onychomycosis, , Pain in right toe, pain in left toes.  Skin lesion left foot  Treatment & Plan Procedures and Treatment: Consent by patient was obtained for treatment procedures. The patient understood the discussion of treatment and procedures well. All questions were answered thoroughly reviewed. Debridement of mycotic and hypertrophic toenails, 1 through 5 bilateral and clearing of subungual debris. No ulceration, no infection noted.  Biopsy skin left foot dorsum.  This patient was anesthetized with mixture of 2% lidocaine plain and 2 % lidocaine with epi. at the site of the skin lesion.  The surgical site was then washed with betadine and alcohol.  Using a punch the lesion was excised and passed off as specimen.    The site was bandaged with neosporin, sterile 2x2 and  kling.  Home instructions given.   Return Visit-Office Procedure: Patient instructed to return to the office for a follow up visit 10 weeks. for continued evaluation and treatment. Patient will be called once the results from the biopsy received from the lab.    Gardiner Barefoot DPM

## 2016-07-18 ENCOUNTER — Other Ambulatory Visit: Payer: Self-pay | Admitting: Neurology

## 2016-07-24 ENCOUNTER — Other Ambulatory Visit: Payer: Self-pay | Admitting: Interventional Cardiology

## 2016-07-24 ENCOUNTER — Encounter: Payer: Self-pay | Admitting: Podiatry

## 2016-09-16 ENCOUNTER — Ambulatory Visit (INDEPENDENT_AMBULATORY_CARE_PROVIDER_SITE_OTHER): Payer: Medicare Other | Admitting: Nurse Practitioner

## 2016-09-16 ENCOUNTER — Encounter: Payer: Self-pay | Admitting: Nurse Practitioner

## 2016-09-16 VITALS — BP 123/77 | HR 57 | Ht 68.5 in | Wt 195.5 lb

## 2016-09-16 DIAGNOSIS — F0280 Dementia in other diseases classified elsewhere without behavioral disturbance: Secondary | ICD-10-CM | POA: Diagnosis not present

## 2016-09-16 DIAGNOSIS — G309 Alzheimer's disease, unspecified: Secondary | ICD-10-CM

## 2016-09-16 NOTE — Progress Notes (Signed)
The blood work results are unremarkable. Please call the patient.  

## 2016-09-16 NOTE — Patient Instructions (Signed)
Continue Aricept at current dose Follow up in 6 months 

## 2016-09-16 NOTE — Progress Notes (Signed)
[facility]  PATIENT: Derek [name] DOB: 02-Oct-1926   REASON FOR VISIT: Follow-up for dementia HISTORY FROM: Patient and daughter [name]    HISTORY OF PRESENT ILLNESS:UPDATE 01/09/2018CM Derek [name], 83 year old male returns for follow-up. He has a history of progressive memory loss. When last seen by Dr. Jannifer Franklin  he was placed on Namenda, however the patient started acting "funny" according to the brother and daughter and stopped the medication. He is back to baseline. He remains on Aricept 10 mg daily without side effects. He does not operate a motor vehicle. Continues with an irregular sleep pattern. No wandering behavior. Eating and drinking fairly well. He has no new neurologic complaints   02/12/16 KWMr. [name] is an 83 year old right-handed white male with a history of a progressive memory disturbance. The patient went off of Aricept, and had a sudden decline in his memory and mentation, he has not regained his level of cognitive functioning prior to coming off of Aricept. The patient is back on 10 mg at night, he is tolerating this medication well. The patient is not operating a motor vehicle at this time. He remains physically active. He has an irregular sleep pattern, he may stay up all night watching TV 1 night, and then sleep 12 hours the next. He is eating and drinking fairly well. Otherwise, he feels well   REVIEW OF SYSTEMS: Full 14 system review of systems performed and notable only for those listed, all others are neg:  Constitutional: Fatigue  Cardiovascular: neg Ear/Nose/Throat: neg  Skin: neg Eyes: neg Respiratory: neg Gastroitestinal: neg  Hematology/Lymphatic: neg  Endocrine: neg Musculoskeletal: Walking difficulty Allergy/Immunology: neg Neurological: Memory loss Psychiatric: Decreased concentration Sleep : neg   ALLERGIES: Allergies  Allergen Reactions  . Augmentin [Amoxicillin-Pot Clavulanate] Rash  . Penicillins Rash  . Sulfa  Antibiotics Rash    HOME MEDICATIONS: Outpatient Medications Prior to Visit  Medication Sig Dispense Refill  . amLODipine (NORVASC) 10 MG tablet Take 10 mg by mouth daily.    Marland Kitchen apixaban (ELIQUIS) 2.5 MG TABS tablet Take 1 tablet (2.5 mg total) by mouth 2 (two) times daily. 180 tablet 3  . atorvastatin (LIPITOR) 40 MG tablet TAKE 1 TABLET ONCE DAILY AT 6 PM. 90 tablet 0  . cholecalciferol (VITAMIN D) 1000 UNITS tablet Take 1,000 Units by mouth daily.    . Coenzyme Q10 (COQ10) 100 MG CAPS Take 100 mg by mouth daily.     Marland Kitchen donepezil (ARICEPT) 10 MG tablet Take 1 tablet (10 mg total) by mouth at bedtime. 90 tablet 3  . isosorbide mononitrate (IMDUR) 30 MG 24 hr tablet TAKE 1 TABLET ONCE DAILY. 90 tablet 0  . losartan (COZAAR) 100 MG tablet Take 100 mg by mouth daily.  2  . metoprolol tartrate (LOPRESSOR) 25 MG tablet TAKE 1 TABLET TWICE DAILY. 180 tablet 2  . Multiple Vitamins-Minerals (CENTRUM SILVER PO) Take 1 tablet by mouth daily.     . Multiple Vitamins-Minerals (PRESERVISION AREDS 2) CAPS Take 1 capsule by mouth 2 (two) times daily.     Marland Kitchen NITROSTAT 0.4 MG SL tablet DISSOLVE 1 TABLET UNDER TONGUE AS NEEDED FOR CHEST PAIN,MAY REPEAT IN5 MINUTES FOR 2 DOSES. 25 tablet 0  . Olopatadine HCl 0.2 % SOLN Place 1 drop into both eyes daily.    Marland Kitchen omeprazole (PRILOSEC) 20 MG capsule Take 20 mg by mouth daily.    . memantine (NAMENDA) 10 MG tablet Take 1 tablet (10 mg total) by mouth 2 (two) times daily. (Patient  not taking: Reported on 09/16/2016) 60 tablet 11   No facility-administered medications prior to visit.     PAST MEDICAL HISTORY: Past Medical History:  Diagnosis Date  . Broken shoulder   . Cerebrovascular disease    MRI/MRA in 2007 - 50% stenosis of supraclinoid ICA, left  . Clavicle fracture   . Complex sleep apnea syndrome    adapt SV titration EEP 6 min 6 and max 15 cm water.   . Compression fracture   . Coronary atherosclerosis of native coronary artery 07/12/2013   a. NSTEMI in  setting of AF with RVR 11/14 >> BMS to CFX;  b. Nuclear (9/15):  High risk - apical lateral ischemia >> c. LHC (9/15):  pLAD 50%, D1 (smaller br 90%/larger br 80%, pDx 50-60%, dLAD 50%, prox-mid CFX stent ok, pOM1 and OM2 50-70% (jailed by stent), OM5 70-80%, mPDA 90% (CFX dsz unchanged from 2014), RCA diff dsz, EF 55%, mild ant-apical HK >>  Med Rx  . CVA (cerebral vascular accident) ([place])   . Dementia   . History of pneumonia   . Hx of echocardiogram    a. Echo 11/14): EF 55-60%, no RWMA, grade 2 DD, mild aortic stenosis (mean 15 mmHg), mild LAE  . Hypertension   . MI (myocardial infarction) 07/2013  . PAF (paroxysmal atrial fibrillation) (HCC)    hx of NSTEMI in setting of AF with RVR 07/2013;  Eliquis for AC  . Shingles    left chest  . Syncope   . TIA (transient ischemic attack) 07/12/2013    PAST SURGICAL HISTORY: Past Surgical History:  Procedure Laterality Date  . CATARACT EXTRACTION Bilateral   . KYPHOPLASTY    . LEFT HEART CATHETERIZATION WITH CORONARY ANGIOGRAM N/A 08/08/2013   Procedure: LEFT HEART CATHETERIZATION WITH CORONARY ANGIOGRAM;  Surgeon: Jettie Booze, MD;  Location[facility] CATH LAB;  Service: Cardiovascular;  Laterality: N/A;  . LEFT HEART CATHETERIZATION WITH CORONARY ANGIOGRAM N/A 05/29/2014   Procedure: LEFT HEART CATHETERIZATION WITH CORONARY ANGIOGRAM;  Surgeon: Sinclair Grooms, MD;  Location: [facility] CATH LAB;  Service: Cardiovascular;  Laterality: N/A;  . PERCUTANEOUS STENT INTERVENTION  08/08/2013   Procedure: PERCUTANEOUS STENT INTERVENTION;  Surgeon: Jettie Booze, MD;  Location: [facility] CATH LAB;  Service: Cardiovascular;;  . SHOULDER SURGERY Left   . TONSILLECTOMY      FAMILY HISTORY: Family History  Problem Relation Age of Onset  . Diabetes Mother   . Diabetes Father   . Cancer - Lung Brother   . Gait disorder Brother   . Cancer Daughter   . Stroke Sister   . Heart attack Neg Hx     SOCIAL HISTORY: Social History   Social History  .  Marital status: Widowed    Spouse name: N/A  . Number of children: 2  . Years of education: CPA   Occupational History  . retired    Social History Main Topics  . Smoking status: Never Smoker  . Smokeless tobacco: Never Used  . Alcohol use 0.6 oz/week    1 Glasses of wine per week     Comment: occasionally  . Drug use: No  . Sexual activity: Not on file   Other Topics Concern  . Not on file   Social History Narrative   Patient is widowed [name]), has 2 children   Patient is right handed   Education level is 4 year degree.   Caffeine consumption is 2-4 cups daily     PHYSICAL EXAM  Vitals:   09/16/16 1045  BP: 123/77  Pulse: (!) 57  Weight: 195 lb 8 oz (88.7 kg)  Height: 5' 8.5" (1.74 m)   Body mass index is 29.29 kg/m.  Generalized: Well developed, in no acute distress  Head: normocephalic and atraumatic,. Oropharynx benign  Musculoskeletal: No deformity   Neurological examination   Mentation: Alert , not oriented to date or year MMSE 24 out of 30 missing 3 of 3 recall. AFT 5. Clock 3/4.  Follows all commands speech and language fluent.   Cranial nerve II-XII: Pupils were equal round reactive to light extraocular movements were full, visual field were full on confrontational test. Facial sensation and strength were normal. hearing was intact to finger rubbing bilaterally. Uvula tongue midline. head turning and shoulder shrug were normal and symmetric.Tongue protrusion into cheek strength was normal. Motor: normal bulk and tone, full strength in the BUE, BLE, fine finger movements normal, no pronator drift. No focal weakness Sensory: normal and symmetric to light touch, pinprick, and  Vibration, in the upper and lower extremities Coordination: finger-nose-finger, heel-to-shin bilaterally, no dysmetria Reflexes: Brachioradialis 2/2, biceps 2/2, triceps 2/2, patellar 2/2, Achilles 2/2, plantar responses were flexor bilaterally. Gait and Station: Rising up from  seated position without assistance, normal stance,  moderate stride, good arm swing, smooth turning, able to perform tiptoe, and heel walking without difficulty. Tandem gait is unsteady. Ambulates with a single-point cane  DIAGNOSTIC DATA (LABS, IMAGING, TESTING) - ASSESSMENT AND PLAN  83 y.o. year old male  has a past medical history of  Cerebrovascular disease;  Coronary atherosclerosis of native coronary artery (07/12/2013); CVA (cerebral vascular accident) ([place]); Dementia; here to follow-up for his memory loss.  Continue Aricept at current dose Follow up in 6 months Dennie Bible, [facility], [facility], APRN  [facility] [address] [place], [place] [address] [phone]

## 2016-10-09 ENCOUNTER — Ambulatory Visit: Payer: Medicare Other | Admitting: Podiatry

## 2016-10-21 ENCOUNTER — Other Ambulatory Visit: Payer: Self-pay | Admitting: Interventional Cardiology

## 2016-11-20 ENCOUNTER — Encounter: Payer: Self-pay | Admitting: Nurse Practitioner

## 2016-11-21 ENCOUNTER — Other Ambulatory Visit: Payer: Self-pay | Admitting: Interventional Cardiology

## 2016-12-04 ENCOUNTER — Other Ambulatory Visit: Payer: Self-pay | Admitting: Interventional Cardiology

## 2016-12-08 NOTE — Telephone Encounter (Signed)
Belva Crome, MD  Erskine Emery, [facility]        Increase to 5 mg BID but we need to check BMET twice yearly.   Spoke with Marcos Eke, Emergency contact. She is aware of dose increase and will call to make appt with Dr. Tamala Julian for patient. She states she appreciates help and will call with any further questions.

## 2016-12-19 ENCOUNTER — Other Ambulatory Visit: Payer: Self-pay | Admitting: Interventional Cardiology

## 2017-01-06 ENCOUNTER — Telehealth: Payer: Self-pay | Admitting: Interventional Cardiology

## 2017-01-06 NOTE — Telephone Encounter (Signed)
New message        *STAT* If patient is at the pharmacy, call can be transferred to refill team.   1. Which medications need to be refilled? (please list name of each medication and dose if known)  metoprolol  2. Which pharmacy/location (including street and city if local pharmacy) is medication to be sent to? [place]  3. Do they need a 30 day or 90 day supply? [place]

## 2017-01-06 NOTE — Telephone Encounter (Signed)
Called patient unable to reach. Final attempt was made for patient to receive refills. Needs appointment for more.

## 2017-01-14 ENCOUNTER — Ambulatory Visit (INDEPENDENT_AMBULATORY_CARE_PROVIDER_SITE_OTHER): Payer: Medicare Other | Admitting: Podiatry

## 2017-01-14 DIAGNOSIS — M79676 Pain in unspecified toe(s): Secondary | ICD-10-CM | POA: Diagnosis not present

## 2017-01-14 DIAGNOSIS — B351 Tinea unguium: Secondary | ICD-10-CM

## 2017-01-14 NOTE — Progress Notes (Signed)
Patient ID: Derek [name], male   DOB: 1927-01-23, 83 y.o.   MRN: [id number]   Subjective: This patient presents with his daughter present treatment room. Is complaining of uncomfortable toenails walking wearing shoes and request toenail debridement. The last visit for this similar service was 07/17/2016.   Objective: Patient is responding directly to questioning DP and PT pulses 1/4 bilaterally Bilateral peripheral pitting edema bilaterally Neurological grossly intact Noted skin lesions bilaterally Atrophic skin with absent hair growth bilaterally The toenails extremely elongated, hypertrophic, deformed, discolored and tender to palpation 6-10 Attach pathology report dated 07/21/2016 skin biopsy left foot Stucco keratoses with epidermal lytic hyperkeratosis (benign needs no follow-up)  Assessment: Neglected symptomatic onychomycoses 6-10  Plan: Debridement toenails 6-10 mechanically and electronically with slight bleeding distal left hallux treated with topical antibiotic ointment and Band-Aid. Removed Band-Aid 1-3 days and continue to apply topical antibiotic ointment until a scab forms  Reappoint 3 months

## 2017-01-14 NOTE — Patient Instructions (Signed)
Removes a Band-Aid left big toe 1-3 days apply topical antibiotic ointment and Band-Aid daily until a scab forms

## 2017-01-16 ENCOUNTER — Other Ambulatory Visit: Payer: Self-pay | Admitting: Interventional Cardiology

## 2017-01-30 ENCOUNTER — Encounter: Payer: Self-pay | Admitting: Interventional Cardiology

## 2017-01-30 ENCOUNTER — Ambulatory Visit (INDEPENDENT_AMBULATORY_CARE_PROVIDER_SITE_OTHER): Payer: Medicare Other | Admitting: Interventional Cardiology

## 2017-01-30 VITALS — BP 152/86 | HR 51 | Ht 68.5 in | Wt 192.0 lb

## 2017-01-30 DIAGNOSIS — I10 Essential (primary) hypertension: Secondary | ICD-10-CM

## 2017-01-30 DIAGNOSIS — I2510 Atherosclerotic heart disease of native coronary artery without angina pectoris: Secondary | ICD-10-CM | POA: Diagnosis not present

## 2017-01-30 DIAGNOSIS — I4891 Unspecified atrial fibrillation: Secondary | ICD-10-CM

## 2017-01-30 DIAGNOSIS — G4731 Primary central sleep apnea: Secondary | ICD-10-CM

## 2017-01-30 NOTE — Progress Notes (Signed)
Cardiology Office Note    Date:  01/30/2017   ID:  Derek [name], DOB 08-15-1927, MRN [id number]  PCP:  Derek Carol, MD  Cardiologist: Derek Grooms, MD   Chief Complaint  Patient presents with  . Coronary Artery Disease  . Atrial Fibrillation    History of Present Illness:  Derek [name] is a 83 y.o. male with a history of CAD s/p BMS to CFx (2014), PAF on Eliquis, embolic CVA, OSA non complaint with CPAP, mild AS, CKD stage II-III dementia and previous syncope who presents to clinic for regular cardiology follow up.   Progressive dementia. No frank episodes of syncope. No recurrent CVA. Some controversy over the correct dose of Eliquis. They state we called with instruction to increase the medication to 5 mg twice daily. There is no record of that in the patient's chart. He's had no bleeding complications, however the son has continued given him 2.5 mg twice a day because the higher dose was associated with confusion. The confusion is not cleared since going back to the 2.5 mg twice a day.  Patient denies chest pain, orthopnea, and PND. He had a near fainting episode while on a trip to [place] and was seen at [facility] where an EKG was performed which is no different than his baseline tracings and today's tracing.  Past Medical History:  Diagnosis Date  . Broken shoulder   . Cerebrovascular disease    MRI/MRA in 2007 - 50% stenosis of supraclinoid ICA, left  . Clavicle fracture   . Complex sleep apnea syndrome    adapt SV titration EEP 6 min 6 and max 15 cm water.   . Compression fracture   . Coronary atherosclerosis of native coronary artery 07/12/2013   a. NSTEMI in setting of AF with RVR 11/14 >> BMS to CFX;  b. Nuclear (9/15):  High risk - apical lateral ischemia >> c. LHC (9/15):  pLAD 50%, D1 (smaller br 90%/larger br 80%, pDx 50-60%, dLAD 50%, prox-mid CFX stent ok, pOM1 and OM2 50-70% (jailed by stent), OM5 70-80%, mPDA 90% (CFX dsz  unchanged from 2014), RCA diff dsz, EF 55%, mild ant-apical HK >>  Med Rx  . CVA (cerebral vascular accident) ([place])   . Dementia   . History of pneumonia   . Hx of echocardiogram    a. Echo 11/14): EF 55-60%, no RWMA, grade 2 DD, mild aortic stenosis (mean 15 mmHg), mild LAE  . Hypertension   . MI (myocardial infarction) 07/2013  . PAF (paroxysmal atrial fibrillation) (HCC)    hx of NSTEMI in setting of AF with RVR 07/2013;  Eliquis for AC  . Shingles    left chest  . Syncope   . TIA (transient ischemic attack) 07/12/2013    Past Surgical History:  Procedure Laterality Date  . CATARACT EXTRACTION Bilateral   . KYPHOPLASTY    . LEFT HEART CATHETERIZATION WITH CORONARY ANGIOGRAM N/A 08/08/2013   Procedure: LEFT HEART CATHETERIZATION WITH CORONARY ANGIOGRAM;  Surgeon: Derek Booze, MD;  Location: [facility] CATH LAB;  Service: Cardiovascular;  Laterality: N/A;  . LEFT HEART CATHETERIZATION WITH CORONARY ANGIOGRAM N/A 05/29/2014   Procedure: LEFT HEART CATHETERIZATION WITH CORONARY ANGIOGRAM;  Surgeon: Derek Grooms, MD;  Location: [facility] CATH LAB;  Service: Cardiovascular;  Laterality: N/A;  . PERCUTANEOUS STENT INTERVENTION  08/08/2013   Procedure: PERCUTANEOUS STENT INTERVENTION;  Surgeon: Derek Booze, MD;  Location: [facility] CATH LAB;  Service: Cardiovascular;;  .  SHOULDER SURGERY Left   . TONSILLECTOMY      Current Medications: Outpatient Medications Prior to Visit  Medication Sig Dispense Refill  . amLODipine (NORVASC) 10 MG tablet Take 10 mg by mouth daily.    Marland Kitchen atorvastatin (LIPITOR) 40 MG tablet TAKE 1 TABLET ONCE DAILY AT 6 PM. 90 tablet 0  . cholecalciferol (VITAMIN D) 1000 UNITS tablet Take 1,000 Units by mouth daily.    . Coenzyme Q10 (COQ10) 100 MG CAPS Take 100 mg by mouth daily.     Marland Kitchen donepezil (ARICEPT) 10 MG tablet Take 1 tablet (10 mg total) by mouth at bedtime. 90 tablet 3  . isosorbide mononitrate (IMDUR) 30 MG 24 hr tablet TAKE 1 TABLET ONCE DAILY. 30 tablet 0  .  metoprolol tartrate (LOPRESSOR) 25 MG tablet TAKE 1 TABLET TWICE DAILY. 60 tablet 0  . Multiple Vitamins-Minerals (CENTRUM SILVER PO) Take 1 tablet by mouth daily.     . Multiple Vitamins-Minerals (PRESERVISION AREDS 2) CAPS Take 1 capsule by mouth 2 (two) times daily.     Marland Kitchen NITROSTAT 0.4 MG SL tablet DISSOLVE 1 TABLET UNDER TONGUE AS NEEDED FOR CHEST PAIN,MAY REPEAT IN5 MINUTES FOR 2 DOSES. 25 tablet 0  . Olopatadine HCl 0.2 % SOLN Place 1 drop into both eyes daily.    Marland Kitchen omeprazole (PRILOSEC) 20 MG capsule Take 20 mg by mouth daily.    Marland Kitchen apixaban (ELIQUIS) 5 MG TABS tablet Take 1 tablet (5 mg total) by mouth 2 (two) times daily. (Patient not taking: Reported on 01/30/2017) 180 tablet 0  . losartan (COZAAR) 100 MG tablet Take 100 mg by mouth daily.  2  . memantine (NAMENDA) 10 MG tablet Take 1 tablet (10 mg total) by mouth 2 (two) times daily. (Patient not taking: Reported on 01/14/2017) 60 tablet 11   No facility-administered medications prior to visit.      Allergies:   Augmentin [amoxicillin-pot clavulanate]; Penicillins; and Sulfa antibiotics   Social History   Social History  . Marital status: Widowed    Spouse name: N/A  . Number of children: 2  . Years of education: CPA   Occupational History  . retired    Social History Main Topics  . Smoking status: Never Smoker  . Smokeless tobacco: Never Used  . Alcohol use 0.6 oz/week    1 Glasses of wine per week     Comment: occasionally  . Drug use: No  . Sexual activity: Not on file   Other Topics Concern  . Not on file   Social History Narrative   Patient is widowed Derek [name]), has 2 children   Patient is right handed   Education level is 4 year degree.   Caffeine consumption is 2-4 cups daily     Family History:  The patient's family history includes Cancer in his daughter; Cancer - Lung in his brother; Diabetes in his father and mother; Gait disorder in his brother; Stroke in his sister.   ROS:   Please see the history  of present illness.    Depression, sedentary lifestyle, decreased balance, difficulty with passing out, and shortness of breath on activity.  All other systems reviewed and are negative.   PHYSICAL EXAM:   VS:  BP (!) 152/86 (BP Location: Left Arm)   Pulse (!) 51   Ht 5' 8.5" (1.74 m)   Wt 192 lb (87.1 kg)   BMI 28.77 kg/m    GEN: Well nourished, well developed, in no acute distress  HEENT: normal  Neck: no JVD, carotid bruits, or masses Cardiac: RRR; 2/6 right upper sternal border systolic murmur. No rubs, or gallops,no edema . Respiratory:  clear to auscultation bilaterally, normal work of breathing GI: soft, nontender, nondistended, + BS MS: no deformity or atrophy  Skin: warm and dry, no rash Neuro:  Alert and Oriented x 3, Strength and sensation are intact Psych: euthymic mood, full affect  Wt Readings from Last 3 Encounters:  01/30/17 192 lb (87.1 kg)  09/16/16 195 lb 8 oz (88.7 kg)  07/17/16 192 lb (87.1 kg)      Studies/Labs Reviewed:   EKG:  EKG  Sinus bradycardia at 51 bpm with normal PR interval. Nonspecific T-wave flattening.  Recent Labs: No results found for requested labs within last 8760 hours.   Lipid Panel    Component Value Date/Time   CHOL 100 12/27/2014 0916   TRIG 67.0 12/27/2014 0916   HDL 29.50 (L) 12/27/2014 0916   CHOLHDL 3 12/27/2014 0916   VLDL 13.4 12/27/2014 0916   LDLCALC 57 12/27/2014 0916    Additional studies/ records that were reviewed today include:  EKG showed [facility] 01/02/2017 reveals no change from the EKG described above.    ASSESSMENT:    1. Atrial fibrillation, unspecified type ([place])   2. Atherosclerosis of native coronary artery of native heart without angina pectoris   3. Essential hypertension   4. Complex sleep apnea syndrome      PLAN:  In order of problems listed above:  1. Currently maintaining normal sinus rhythm/sinus bradycardia.CHADS VASC 4. There is some controversy  concerning the correct dose of Eliquis. Currently taken 2.5 mg twice a day as we have previously prescribed. A more recent recommendation is been 5 mg twice daily. We will obtain a basic metabolic panel today and make a determination concerning the dose. For the time being continue 2.5 mg twice daily. 2. Denies chest discomfort or ischemic 3. Blood pressure systolic is mildly elevated but given his overall condition, with known sugar vascular disease, I don't believe further blood pressure lowering is reasonable.  We will plan to perform a basic metabolic panel today. We will adjust the anticoagulation based upon kidney function. Clinical follow-up with me in one year as needed    Medication Adjustments/Labs and Tests Ordered: Current medicines are reviewed at length with the patient today.  Concerns regarding medicines are outlined above.  Medication changes, Labs and Tests ordered today are listed in the Patient Instructions below. There are no Patient Instructions on file for this visit.   Signed, Derek Grooms, MD  01/30/2017 2:27 PM    [facility] [address], [place], [place]  [address] Phone: [phone]; Fax: [phone]

## 2017-01-30 NOTE — Patient Instructions (Signed)
Medication Instructions:  None  Labwork: BMET today  Testing/Procedures: None  Follow-Up: Your physician wants you to follow-up in: 1 year with Dr. Tamala Julian. You will receive a reminder letter in the mail two months in advance. If you don't receive a letter, please call our office to schedule the follow-up appointment.    Any Other Special Instructions Will Be Listed Below (If Applicable).     If you need a refill on your cardiac medications before your next appointment, please call your pharmacy.

## 2017-01-31 LAB — BASIC METABOLIC PANEL
BUN/Creatinine Ratio: 15 (ref 10–24)
BUN: 22 mg/dL (ref 8–27)
CALCIUM: 9.9 mg/dL (ref 8.6–10.2)
CO2: 24 mmol/L (ref 18–29)
CREATININE: 1.49 mg/dL — AB (ref 0.76–1.27)
Chloride: 103 mmol/L (ref 96–106)
GFR calc Af Amer: 48 mL/min/{1.73_m2} — ABNORMAL LOW (ref >59)
GFR calc non Af Amer: 42 mL/min/{1.73_m2} — ABNORMAL LOW (ref >59)
GLUCOSE: 79 mg/dL (ref 65–99)
Potassium: 5.2 mmol/L (ref 3.5–5.2)
SODIUM: 141 mmol/L (ref 134–144)

## 2017-02-03 ENCOUNTER — Telehealth: Payer: Self-pay | Admitting: *Deleted

## 2017-02-03 MED ORDER — APIXABAN 5 MG PO TABS: 5.0000 mg | ORAL_TABLET | Freq: Two times a day (BID) | ORAL | 3 refills | Status: DC

## 2017-02-03 NOTE — Telephone Encounter (Signed)
Spoke with daughter, [name], [place] on file.  Made her aware of lab results and recommendations per Dr. Tamala Julian.  Daughter states pt can be unsteady on his feet occasionally.  Advised if he becomes more unsteady or has a fall to let us know and we may have to back off on the dose of Eliquis.  Daughter verbalized understanding and was in agreement with this plan.

## 2017-02-03 NOTE — Telephone Encounter (Signed)
-----   Message from Belva Crome, MD sent at 01/31/2017  3:31 PM EDT ----- Let the patient know kidney function is only mildly impaired, and the correct dose is 5 mg of Eliquis twice daily. If he is a fall risk, 2.5 mg twice daily may be reasonable however, stroke protection will be less than reported with the optimal dose. A copy will be sent to Seward Carol, MD

## 2017-02-06 ENCOUNTER — Telehealth: Payer: Self-pay | Admitting: Interventional Cardiology

## 2017-02-06 NOTE — Telephone Encounter (Signed)
Sure if they can extract without excessive bleeding after holding for only one dose that would be great.

## 2017-02-06 NOTE — Telephone Encounter (Signed)
New Message     1. What dental office are you calling from? Dr Virgel Paling   2. What is your office phone and fax number?  Fax [phone]   3. What type of procedure is the patient having performed?  Extraction -1 tooth fractured off to bone , surgical , no sedation   4. What date is procedure scheduled?  Not scheduled   5. What is your question (ex. Antibiotics prior to procedure, holding medication-we need to know how long dentist wants pt to hold med)? Yes he needs premed , also needs to stop Eliquis

## 2017-02-06 NOTE — Telephone Encounter (Signed)
Will fax updated clearance - since pt is only having 1 dental extraction and given previous stroke history, would prefer minimizing time off of anticoagulation therapy. Would recommend only holding 1 dose of Eliquis prior to dental work, and a maximum of holding 2 doses prior. Resume anticoagulation as soon as possible after procedure.   Updated clearance faxed to dentist office.

## 2017-02-06 NOTE — Telephone Encounter (Signed)
Dr Tamala Julian, would you be ok with pt only holding 1-2 doses of Eliquis given previous stroke? I can confirm with dental office to make sure they are comfortable with this.

## 2017-02-06 NOTE — Telephone Encounter (Signed)
Spoke with Dr. Madilyn Fireman and she inquired about pt holding Eliquis for dental procedure listed below.  Advised I would send message to Dr. Tamala Julian and [facility] for review and advisement.  Verified fax and phone number listed with call and these are correct.  Dr. Madilyn Fireman appreciative for assistance.

## 2017-02-06 NOTE — Telephone Encounter (Signed)
Fax sent to requesting office.  

## 2017-02-06 NOTE — Telephone Encounter (Signed)
Okay to hold Eliquis x 48 hours before extraction.

## 2017-02-09 ENCOUNTER — Other Ambulatory Visit: Payer: Self-pay | Admitting: Interventional Cardiology

## 2017-02-11 DIAGNOSIS — H10413 Chronic giant papillary conjunctivitis, bilateral: Secondary | ICD-10-CM | POA: Diagnosis not present

## 2017-02-11 DIAGNOSIS — H40053 Ocular hypertension, bilateral: Secondary | ICD-10-CM | POA: Diagnosis not present

## 2017-02-11 DIAGNOSIS — H353132 Nonexudative age-related macular degeneration, bilateral, intermediate dry stage: Secondary | ICD-10-CM | POA: Diagnosis not present

## 2017-02-11 DIAGNOSIS — Z961 Presence of intraocular lens: Secondary | ICD-10-CM | POA: Diagnosis not present

## 2017-04-14 ENCOUNTER — Ambulatory Visit: Payer: Medicare Other | Admitting: Nurse Practitioner

## 2017-04-15 ENCOUNTER — Ambulatory Visit (INDEPENDENT_AMBULATORY_CARE_PROVIDER_SITE_OTHER): Payer: Medicare Other | Admitting: Podiatry

## 2017-04-15 NOTE — Progress Notes (Signed)
Erroneous encounter no show  

## 2017-05-08 ENCOUNTER — Other Ambulatory Visit: Payer: Self-pay | Admitting: Neurology

## 2017-05-25 ENCOUNTER — Ambulatory Visit (INDEPENDENT_AMBULATORY_CARE_PROVIDER_SITE_OTHER): Payer: Medicare Other | Admitting: Podiatry

## 2017-05-25 ENCOUNTER — Encounter: Payer: Self-pay | Admitting: Podiatry

## 2017-05-25 DIAGNOSIS — B351 Tinea unguium: Secondary | ICD-10-CM | POA: Diagnosis not present

## 2017-05-25 DIAGNOSIS — M79676 Pain in unspecified toe(s): Secondary | ICD-10-CM

## 2017-05-25 NOTE — Patient Instructions (Signed)
Removed Band-Aid on the third left toe 1-3 days and apply topical antibiotic ointment and Band-Aid daily until a scab forms

## 2017-05-25 NOTE — Progress Notes (Signed)
Patient ID: Derek [name], male   DOB: 1927-06-20, 83 y.o.   MRN: [id number]    Subjective: This patient presents with his daughter present treatment room. Is complaining of uncomfortable toenails walking wearing shoes and request toenail debridement. The last visit for this similar service was 07/17/2016.   Objective: Patient is responding directly to questioning DP and PT pulses 1/4 bilaterally Bilateral peripheral pitting edema bilaterally Neurological grossly intact Noted skin lesions bilaterally Atrophic skin with absent hair growth bilaterally The toenails extremely elongated, hypertrophic, deformed, discolored and tender to palpation 6-10 Attach pathology report dated 07/21/2016 skin biopsy left foot Stucco keratoses with epidermal lytic hyperkeratosis (benign needs no follow-up)  Assessment: Neglected symptomatic onychomycoses 6-10  Plan: Debridement toenails 6-10 mechanically and electronically with slight bleeding distal third left toe, treated with topical antibiotic ointment and Band-Aid. Removed Band-Aid 1-3 days and continue to apply topical antibiotic ointment until a scab forms  Reappoint 3 months

## 2017-05-27 DIAGNOSIS — Z961 Presence of intraocular lens: Secondary | ICD-10-CM | POA: Diagnosis not present

## 2017-05-27 DIAGNOSIS — H10413 Chronic giant papillary conjunctivitis, bilateral: Secondary | ICD-10-CM | POA: Diagnosis not present

## 2017-05-27 DIAGNOSIS — H40053 Ocular hypertension, bilateral: Secondary | ICD-10-CM | POA: Diagnosis not present

## 2017-05-27 DIAGNOSIS — H34231 Retinal artery branch occlusion, right eye: Secondary | ICD-10-CM | POA: Diagnosis not present

## 2017-05-27 DIAGNOSIS — H353131 Nonexudative age-related macular degeneration, bilateral, early dry stage: Secondary | ICD-10-CM | POA: Diagnosis not present

## 2017-05-28 ENCOUNTER — Other Ambulatory Visit: Payer: Self-pay | Admitting: Neurology

## 2017-06-24 ENCOUNTER — Other Ambulatory Visit: Payer: Self-pay | Admitting: Neurology

## 2017-06-24 DIAGNOSIS — Z23 Encounter for immunization: Secondary | ICD-10-CM | POA: Diagnosis not present

## 2017-07-02 DIAGNOSIS — F0390 Unspecified dementia without behavioral disturbance: Secondary | ICD-10-CM | POA: Diagnosis not present

## 2017-07-02 DIAGNOSIS — I350 Nonrheumatic aortic (valve) stenosis: Secondary | ICD-10-CM | POA: Diagnosis not present

## 2017-07-02 DIAGNOSIS — Z1389 Encounter for screening for other disorder: Secondary | ICD-10-CM | POA: Diagnosis not present

## 2017-07-02 DIAGNOSIS — I639 Cerebral infarction, unspecified: Secondary | ICD-10-CM | POA: Diagnosis not present

## 2017-07-02 DIAGNOSIS — G4730 Sleep apnea, unspecified: Secondary | ICD-10-CM | POA: Diagnosis not present

## 2017-07-02 DIAGNOSIS — I480 Paroxysmal atrial fibrillation: Secondary | ICD-10-CM | POA: Diagnosis not present

## 2017-07-02 DIAGNOSIS — Z0000 Encounter for general adult medical examination without abnormal findings: Secondary | ICD-10-CM | POA: Diagnosis not present

## 2017-07-02 DIAGNOSIS — N400 Enlarged prostate without lower urinary tract symptoms: Secondary | ICD-10-CM | POA: Diagnosis not present

## 2017-07-02 DIAGNOSIS — I10 Essential (primary) hypertension: Secondary | ICD-10-CM | POA: Diagnosis not present

## 2017-07-02 DIAGNOSIS — N183 Chronic kidney disease, stage 3 (moderate): Secondary | ICD-10-CM | POA: Diagnosis not present

## 2017-07-23 ENCOUNTER — Other Ambulatory Visit: Payer: Self-pay | Admitting: Nurse Practitioner

## 2017-08-01 ENCOUNTER — Other Ambulatory Visit: Payer: Self-pay | Admitting: Nurse Practitioner

## 2017-08-03 ENCOUNTER — Encounter: Payer: Self-pay | Admitting: Nurse Practitioner

## 2017-08-03 ENCOUNTER — Ambulatory Visit (INDEPENDENT_AMBULATORY_CARE_PROVIDER_SITE_OTHER): Payer: Medicare Other | Admitting: Nurse Practitioner

## 2017-08-03 VITALS — BP 155/76 | HR 58 | Wt 197.0 lb

## 2017-08-03 DIAGNOSIS — F0280 Dementia in other diseases classified elsewhere without behavioral disturbance: Secondary | ICD-10-CM

## 2017-08-03 DIAGNOSIS — G309 Alzheimer's disease, unspecified: Secondary | ICD-10-CM

## 2017-08-03 DIAGNOSIS — I2510 Atherosclerotic heart disease of native coronary artery without angina pectoris: Secondary | ICD-10-CM | POA: Diagnosis not present

## 2017-08-03 MED ORDER — DONEPEZIL HCL 10 MG PO TABS: 10.0000 mg | ORAL_TABLET | Freq: Every day | ORAL | 6 refills | Status: DC

## 2017-08-03 NOTE — Progress Notes (Signed)
I have read the note, and I agree with the clinical assessment and plan.  Elzada Pytel K Clarissa Laird   

## 2017-08-03 NOTE — Patient Instructions (Signed)
Continue Aricept at current dose Stay well-hydrated to prevent dehydration Follow up in 8 months, next with Dr. Jannifer Franklin

## 2017-08-03 NOTE — Progress Notes (Signed)
[facility]  PATIENT: Derek [name] DOB: Sep 23, 1926   REASON FOR VISIT: Follow-up for dementia HISTORY FROM: Patient and [name] 11/26/2018CM Derek [name], 83 year old male returns for follow-up with his son.  He has a history of progressive memory disorder.  He has had side effects to Namenda in the past.  He is currently on Aricept 10 mg daily without side effects.  His [name] lives with him.  He feels his memory is stable.  He does not operate a motor vehicle.  He is fairly independent with activities of daily living.  Appetite is good he occasionally gets up at night but not often.  No wandering behavior.  No falls He returns for reevaluation   UPDATE 01/09/2018CM Derek [name], 83 year old male returns for follow-up. He has a history of progressive memory loss. When last seen by Dr. Jannifer [name]  he was placed on Namenda, however the patient started acting "funny" according to the brother and daughter and stopped the medication. He is back to baseline. He remains on Aricept 10 mg daily without side effects. He does not operate a motor vehicle. Continues with an irregular sleep pattern. No wandering behavior. Eating and drinking fairly well. He has no new neurologic complaints   02/12/16 KWMr. [name] is an 83 year old right-handed white male with a history of a progressive memory disturbance. The patient went off of Aricept, and had a sudden decline in his memory and mentation, he has not regained his level of cognitive functioning prior to coming off of Aricept. The patient is back on 10 mg at night, he is tolerating this medication well. The patient is not operating a motor vehicle at this time. He remains physically active. He has an irregular sleep pattern, he may stay up all night watching TV 1 night, and then sleep 12 hours the next. He is eating and drinking fairly well. Otherwise, he feels well   REVIEW OF SYSTEMS: Full 14 system  review of systems performed and notable only for those listed, all others are neg:  Constitutional: Fatigue  Cardiovascular: neg Ear/Nose/Throat: Hearing loss Skin: neg Eyes: neg Respiratory: neg Gastroitestinal: neg  Hematology/Lymphatic: neg  Endocrine: Intolerance to cold Musculoskeletal: Walking difficulty Allergy/Immunology: neg Neurological: Memory loss Psychiatric: Decreased concentration Sleep : Obstructive sleep apnea does not use CPAP   ALLERGIES: Allergies  Allergen Reactions  . Augmentin [Amoxicillin-Pot Clavulanate] Rash  . Penicillins Rash  . Sulfa Antibiotics Rash    HOME MEDICATIONS: Outpatient Medications Prior to Visit  Medication Sig Dispense Refill  . amLODipine (NORVASC) 10 MG tablet Take 10 mg by mouth daily.    Marland Kitchen apixaban (ELIQUIS) 5 MG TABS tablet Take 1 tablet (5 mg total) by mouth 2 (two) times daily. 180 tablet 3  . atorvastatin (LIPITOR) 40 MG tablet TAKE 1 TABLET ONCE DAILY AT 6 PM. 90 tablet 0  . cholecalciferol (VITAMIN D) 1000 UNITS tablet Take 1,000 Units by mouth daily.    . Coenzyme Q10 (COQ10) 100 MG CAPS Take 100 mg by mouth daily.     Marland Kitchen donepezil (ARICEPT) 10 MG tablet TAKE ONE TABLET AT BEDTIME. 30 tablet 0  . isosorbide mononitrate (IMDUR) 30 MG 24 hr tablet Take 1 tablet (30 mg total) by mouth daily. 30 tablet 11  . metoprolol tartrate (LOPRESSOR) 25 MG tablet Take 1 tablet (25 mg total) by mouth 2 (two) times daily. 60 tablet 11  . Multiple Vitamins-Minerals (CENTRUM SILVER PO) Take 1 tablet by mouth daily.     Marland Kitchen  Multiple Vitamins-Minerals (PRESERVISION AREDS 2) CAPS Take 1 capsule by mouth 2 (two) times daily.     Marland Kitchen NITROSTAT 0.4 MG SL tablet DISSOLVE 1 TABLET UNDER TONGUE AS NEEDED FOR CHEST PAIN,MAY REPEAT IN5 MINUTES FOR 2 DOSES. 25 tablet 0  . Olopatadine HCl 0.2 % SOLN Place 1 drop into both eyes daily.    Marland Kitchen omeprazole (PRILOSEC) 20 MG capsule Take 20 mg by mouth daily.     No facility-administered medications prior to visit.       PAST MEDICAL HISTORY: Past Medical History:  Diagnosis Date  . Broken shoulder   . Cerebrovascular disease    MRI/MRA in 2007 - 50% stenosis of supraclinoid ICA, left  . Clavicle fracture   . Complex sleep apnea syndrome    adapt SV titration EEP 6 min 6 and max 15 cm water.   . Compression fracture   . Coronary atherosclerosis of native coronary artery 07/12/2013   a. NSTEMI in setting of AF with RVR 11/14 >> BMS to CFX;  b. Nuclear (9/15):  High risk - apical lateral ischemia >> c. LHC (9/15):  pLAD 50%, D1 (smaller br 90%/larger br 80%, pDx 50-60%, dLAD 50%, prox-mid CFX stent ok, pOM1 and OM2 50-70% (jailed by stent), OM5 70-80%, mPDA 90% (CFX dsz unchanged from 2014), RCA diff dsz, EF 55%, mild ant-apical HK >>  Med Rx  . CVA (cerebral vascular accident) ([place])   . Dementia   . History of pneumonia   . Hx of echocardiogram    a. Echo 11/14): EF 55-60%, no RWMA, grade 2 DD, mild aortic stenosis (mean 15 mmHg), mild LAE  . Hypertension   . MI (myocardial infarction) ([place]) 07/2013  . PAF (paroxysmal atrial fibrillation) ([facility])    hx of NSTEMI in setting of AF with RVR 07/2013;  Eliquis for AC  . Shingles    left chest  . Syncope   . TIA (transient ischemic attack) 07/12/2013    PAST SURGICAL HISTORY: Past Surgical History:  Procedure Laterality Date  . CATARACT EXTRACTION Bilateral   . KYPHOPLASTY    . LEFT HEART CATHETERIZATION WITH CORONARY ANGIOGRAM N/A 08/08/2013   Procedure: LEFT HEART CATHETERIZATION WITH CORONARY ANGIOGRAM;  Surgeon: Jettie Booze, MD;  Location: [facility] CATH LAB;  Service: Cardiovascular;  Laterality: N/A;  . LEFT HEART CATHETERIZATION WITH CORONARY ANGIOGRAM N/A 05/29/2014   Procedure: LEFT HEART CATHETERIZATION WITH CORONARY ANGIOGRAM;  Surgeon: Sinclair Grooms, MD;  Location: [facility] CATH LAB;  Service: Cardiovascular;  Laterality: N/A;  . PERCUTANEOUS STENT INTERVENTION  08/08/2013   Procedure: PERCUTANEOUS STENT INTERVENTION;  Surgeon: Jettie Booze, MD;  Location: [facility] CATH LAB;  Service: Cardiovascular;;  . SHOULDER SURGERY Left   . TONSILLECTOMY      FAMILY HISTORY: Family History  Problem Relation Age of Onset  . Diabetes Mother   . Diabetes Father   . Cancer - Lung Brother   . Gait disorder Brother   . Cancer Daughter   . Stroke Sister   . Heart attack Neg Hx     SOCIAL HISTORY: Social History   Socioeconomic History  . Marital status: Widowed    Spouse name: Not on file  . Number of children: 2  . Years of education: Engineer, maintenance (IT)  . Highest education level: Not on file  Social Needs  . Financial resource strain: Not on file  . Food insecurity - worry: Not on file  . Food insecurity - inability: Not on file  . Transportation needs -  medical: Not on file  . Transportation needs - non-medical: Not on file  Occupational History  . Occupation: retired  Tobacco Use  . Smoking status: Never Smoker  . Smokeless tobacco: Never Used  Substance and Sexual Activity  . Alcohol use: Yes    Alcohol/week: 0.6 oz    Types: 1 Glasses of wine per week    Comment: occasionally  . Drug use: No  . Sexual activity: Not on file  Other Topics Concern  . Not on file  Social History Narrative   Patient is widowed [name]), has 2 children   Patient is right handed   Education level is 4 year degree.   Caffeine consumption is 2-4 cups daily     PHYSICAL EXAM  Vitals:   08/03/17 1349  BP: (!) 155/76  Pulse: (!) 58  Weight: 197 lb (89.4 kg)   Body mass index is 29.52 kg/m.  Generalized: Well developed, in no acute distress  Head: normocephalic and atraumatic,. Oropharynx benign  Musculoskeletal: No deformity   Neurological examination   Mentation: Alert , not oriented to date or year MMSE 20 out of 30 missing 3 of 3 recall. AFT 8,. Clock 2/4.  Follows all commands speech and language fluent.   Cranial nerve II-XII: Pupils were equal round reactive to light extraocular movements were full, visual field were full on  confrontational test. Facial sensation and strength were normal. hearing was intact to finger rubbing bilaterally. Uvula tongue midline. head turning and shoulder shrug were normal and symmetric.Tongue protrusion into cheek strength was normal. Motor: normal bulk and tone, full strength in the BUE, BLE, fine finger movements normal, no pronator drift. No focal weakness Sensory: normal and symmetric to light touch, in the upper and lower extremities  Coordination: finger-nose-finger, heel-to-shin bilaterally, no dysmetria Reflexes: Symmetric upper and lower, plantar responses were flexor bilaterally. Gait and Station: Rising up from seated position without assistance, normal stance,  moderate stride, good arm swing, smooth turning, able to perform tiptoe, and heel walking without difficulty. Tandem gait is unsteady.  No assistive device  DIAGNOSTIC DATA (LABS, IMAGING, TESTING) - ASSESSMENT AND PLAN  83 y.o. year old male  has a past medical history of  Cerebrovascular disease;  Coronary atherosclerosis of native coronary artery (07/12/2013); CVA (cerebral vascular accident) ([place]); Dementia; here to follow-up for his memory loss.  Continue Aricept at current dose will refill Stay well-hydrated to prevent dehydration Maintain structured environment, avoid multitasking Regular exercise, participate in cognitively stimulating activities Follow up in 6 months, next with Dr. Jerline Pain, [facility], [facility], [facility] [address] [place], [place] [id number] [phone]

## 2017-08-24 ENCOUNTER — Ambulatory Visit: Payer: Medicare Other | Admitting: Podiatry

## 2017-09-16 ENCOUNTER — Ambulatory Visit (INDEPENDENT_AMBULATORY_CARE_PROVIDER_SITE_OTHER): Payer: Medicare Other | Admitting: Podiatry

## 2017-09-16 ENCOUNTER — Encounter: Payer: Self-pay | Admitting: Podiatry

## 2017-09-16 DIAGNOSIS — M79676 Pain in unspecified toe(s): Secondary | ICD-10-CM | POA: Diagnosis not present

## 2017-09-16 DIAGNOSIS — B351 Tinea unguium: Secondary | ICD-10-CM | POA: Diagnosis not present

## 2017-09-16 DIAGNOSIS — D689 Coagulation defect, unspecified: Secondary | ICD-10-CM

## 2017-09-16 NOTE — Progress Notes (Signed)
Patient ID: Derek [name], male   DOB: 1926/12/12, 84 y.o.   MRN: [id number] Complaint:  Visit Type: Patient returns to my office for continued preventative foot care services. Complaint: Patient states" my nails have grown long and thick and become painful to walk and wear shoes" . The patient presents for preventative foot care services. No changes to ROS  Podiatric Exam: Vascular: dorsalis pedis and posterior tibial pulses are not  palpable bilateral. Capillary return is immediate. Temperature gradient is WNL. Skin turgor WNL  Sensorium: Normal Semmes Weinstein monofilament test. Normal tactile sensation bilaterally. Nail Exam: Pt has thick disfigured discolored nails with subungual debris noted bilateral entire nail hallux through fifth toenails Ulcer Exam: There is no evidence of ulcer or pre-ulcerative changes or infection. Orthopedic Exam: Muscle tone and strength are WNL. No limitations in general ROM. No crepitus or effusions noted. Foot type and digits show no abnormalities. Bony prominences are unremarkable. Skin: No Porokeratosis. No infection or ulcers.    Diagnosis:  Onychomycosis, , Pain in right toe, pain in left toes.    Treatment & Plan Procedures and Treatment: Consent by patient was obtained for treatment procedures. The patient understood the discussion of treatment and procedures well. All questions were answered thoroughly reviewed. Debridement of mycotic and hypertrophic toenails, 1 through 5 bilateral and clearing of subungual debris. No ulceration, no infection noted.     Return Visit-Office Procedure: Patient instructed to return to the office for a follow up visit 10 weeks. for continued evaluation and treatment.     Gardiner Barefoot DPM

## 2017-11-24 DIAGNOSIS — R55 Syncope and collapse: Secondary | ICD-10-CM | POA: Diagnosis not present

## 2017-12-15 ENCOUNTER — Ambulatory Visit (INDEPENDENT_AMBULATORY_CARE_PROVIDER_SITE_OTHER): Payer: Medicare Other | Admitting: Podiatry

## 2017-12-15 ENCOUNTER — Encounter: Payer: Self-pay | Admitting: Podiatry

## 2017-12-15 DIAGNOSIS — M79676 Pain in unspecified toe(s): Secondary | ICD-10-CM

## 2017-12-15 DIAGNOSIS — B351 Tinea unguium: Secondary | ICD-10-CM

## 2017-12-15 DIAGNOSIS — D689 Coagulation defect, unspecified: Secondary | ICD-10-CM

## 2017-12-15 NOTE — Progress Notes (Signed)
Patient ID: Derek [name], male   DOB: October 09, 1926, 84 y.o.   MRN: [id number] Complaint:  Visit Type: Patient returns to my office for continued preventative foot care services. Complaint: Patient states" my nails have grown long and thick and become painful to walk and wear shoes" . The patient presents for preventative foot care services. No changes to ROS  Podiatric Exam: Vascular: dorsalis pedis and posterior tibial pulses are not  palpable bilateral. Capillary return is immediate. Temperature gradient is WNL. Skin turgor WNL  Sensorium: Normal Semmes Weinstein monofilament test. Normal tactile sensation bilaterally. Nail Exam: Pt has thick disfigured discolored nails with subungual debris noted bilateral entire nail hallux through fifth toenails Ulcer Exam: There is no evidence of ulcer or pre-ulcerative changes or infection. Orthopedic Exam: Muscle tone and strength are WNL. No limitations in general ROM. No crepitus or effusions noted. Foot type and digits show no abnormalities. Bony prominences are unremarkable. Skin: No Porokeratosis. No infection or ulcers.    Diagnosis:  Onychomycosis, , Pain in right toe, pain in left toes.    Treatment & Plan Procedures and Treatment: Consent by patient was obtained for treatment procedures. The patient understood the discussion of treatment and procedures well. All questions were answered thoroughly reviewed. Debridement of mycotic and hypertrophic toenails, 1 through 5 bilateral and clearing of subungual debris. No ulceration, no infection noted.     Return Visit-Office Procedure: Patient instructed to return to the office for a follow up visit 10 weeks. for continued evaluation and treatment.     Gardiner Barefoot DPM

## 2017-12-18 ENCOUNTER — Telehealth: Payer: Self-pay | Admitting: Interventional Cardiology

## 2017-12-18 NOTE — Telephone Encounter (Addendum)
Left message to call back and advised on message if CP is current should proceed to ER.

## 2017-12-18 NOTE — Telephone Encounter (Signed)
Agree with instruction 

## 2017-12-18 NOTE — Telephone Encounter (Signed)
New message   Pt c/o of Chest Pain: STAT if CP now or developed within 24 hours  1. Are you having CP right now? YES  2. Are you experiencing any other symptoms (ex. SOB, nausea, vomiting, sweating)? NO   3. How long have you been experiencing CP? ?  4. Is your CP continuous or coming and going? COMING AND GOING  5. Have you taken Nitroglycerin? NO ?    Marland Kitchen

## 2017-12-18 NOTE — Telephone Encounter (Signed)
Pt's son called back and reported that last night pt was very weak and had no energy.  Today pt has been having some chest discomfort off and on.  Pt and son do not feel like it is bad enough to warrant coming to the hospital.  Could hear pt in background say that he is currently having slight chest discomfort in his sternal area but it is mild.  Pt had a syncopal episode a couple of weeks ago after eating.  Son states that this is something that has happened over the years and cardiology is aware.  Son asked that I call daughter and schedule appt for next week.  Told son that if sx's change or worsen pt should go to ER.  Son in agreement.  Spoke with daughter and scheduled pt to see Ermalinda Barrios, PA-C on 4/16.  Asked daughter if pt had tried Nitro and she said no.  She will have pt try one to see if any relief.  Advised daughter as well to bring pt to ER if any changes in sx.  Daughter appreciative for call.

## 2017-12-22 ENCOUNTER — Ambulatory Visit (INDEPENDENT_AMBULATORY_CARE_PROVIDER_SITE_OTHER): Payer: Medicare Other | Admitting: Physician Assistant

## 2017-12-22 ENCOUNTER — Encounter: Payer: Self-pay | Admitting: Physician Assistant

## 2017-12-22 ENCOUNTER — Telehealth: Payer: Self-pay

## 2017-12-22 ENCOUNTER — Ambulatory Visit
Admission: RE | Admit: 2017-12-22 | Discharge: 2017-12-22 | Disposition: A | Discharge status: Home / Self Care | Payer: Medicare Other | Source: Ambulatory Visit | Attending: Physician Assistant | Admitting: Physician Assistant

## 2017-12-22 VITALS — BP 142/86 | HR 56 | Ht 68.0 in | Wt 193.0 lb

## 2017-12-22 DIAGNOSIS — R55 Syncope and collapse: Secondary | ICD-10-CM

## 2017-12-22 DIAGNOSIS — I480 Paroxysmal atrial fibrillation: Secondary | ICD-10-CM

## 2017-12-22 DIAGNOSIS — Z8673 Personal history of transient ischemic attack (TIA), and cerebral infarction without residual deficits: Secondary | ICD-10-CM

## 2017-12-22 DIAGNOSIS — I10 Essential (primary) hypertension: Secondary | ICD-10-CM

## 2017-12-22 DIAGNOSIS — I25118 Atherosclerotic heart disease of native coronary artery with other forms of angina pectoris: Secondary | ICD-10-CM

## 2017-12-22 DIAGNOSIS — E782 Mixed hyperlipidemia: Secondary | ICD-10-CM | POA: Diagnosis not present

## 2017-12-22 DIAGNOSIS — R059 Cough, unspecified: Secondary | ICD-10-CM

## 2017-12-22 DIAGNOSIS — I350 Nonrheumatic aortic (valve) stenosis: Secondary | ICD-10-CM | POA: Insufficient documentation

## 2017-12-22 DIAGNOSIS — R05 Cough: Secondary | ICD-10-CM

## 2017-12-22 MED ORDER — DOXYCYCLINE HYCLATE 100 MG PO TABS: 100.0000 mg | ORAL_TABLET | Freq: Two times a day (BID) | ORAL | 0 refills | Status: AC

## 2017-12-22 MED ORDER — NITROGLYCERIN 0.4 MG SL SUBL: 0.4000 mg | SUBLINGUAL_TABLET | SUBLINGUAL | 2 refills | Status: DC | PRN

## 2017-12-22 NOTE — Telephone Encounter (Signed)
-----   Message from Imogene Burn, PA-C sent at 12/22/2017  3:00 PM EDT ----- Please let patient know he has pneumonia.  Begin doxycycline 100 mg twice daily with food for 7 days.  Please make sure he follows up with Dr. polite for the pneumonia with a follow-up chest x-ray and any further treatment needed.  Labs not back yet.  If he is not feeling better, has high fever, chills he needs to go to the emergency room.

## 2017-12-22 NOTE — Progress Notes (Signed)
Cardiology Office Note    Date:  12/22/2017   ID:  Derek [name], DOB 12/18/1926, MRN [id number]  PCP:  Seward Carol, MD  Cardiologist: Sinclair Grooms, MD  Chief Complaint  Patient presents with  . Cough    History of Present Illness:  Derek [name] is a 84 y.o. male with a history of CAD s/p BMS to CFx (2014), PAF on Eliquis, embolic CVA, OSA non complaint with CPAP, mild AS, CKD stage II-III dementia and previous syncope   Patient last saw Dr. Tamala Julian 01/2017 at which time he had progressive dementia.  There was confusion over his Eliquis dose and family was given to him 2.5 twice daily.  He also had a near fainting episode on a trip and was seen at [facility] where EKG was unchanged.  Was in normal sinus rhythm.  Dr. Tamala Julian recommend he be on Eliquis 5 mg twice daily.  If he is at fall risk 2.5 mg twice daily may be reasonable.  High risk NST 2015.last cath 2015 widely patent proximal LAD and circumflex.  Circumflex stent patent, high grade disease in the first diagonal, fifth OM and PDA accounting for the high risk abnormality on NST.  Diffuse disease in nondominant RCA, preserved overall LV function.  Medical therapy recommended   Patient added on to my schedule because of complaints of recurrent chest pain. The other night he had no energy. Complains of aching chest pain-all day for 3 days-no significant tightness and no change with exertion.  Since on the couch most of the day.  Also coughing a lot of phlegm up-white in color and feels wheezy.  3 weeks ago had 1 of his syncopal episodes where he got sweaty and dizzy and passed out.  Since that similar turner his prior episodes.  When the paramedics came they did an EKG and his heart rate was fine and he did not want to go to the hospital.   Past Medical History:  Diagnosis Date  . Broken shoulder   . Cerebrovascular disease    MRI/MRA in 2007 - 50% stenosis of supraclinoid ICA, left  . Clavicle fracture   . Complex  sleep apnea syndrome    adapt SV titration EEP 6 min 6 and max 15 cm water.   . Compression fracture   . Coronary atherosclerosis of native coronary artery 07/12/2013   a. NSTEMI in setting of AF with RVR 11/14 >> BMS to CFX;  b. Nuclear (9/15):  High risk - apical lateral ischemia >> c. LHC (9/15):  pLAD 50%, D1 (smaller br 90%/larger br 80%, pDx 50-60%, dLAD 50%, prox-mid CFX stent ok, pOM1 and OM2 50-70% (jailed by stent), OM5 70-80%, mPDA 90% (CFX dsz unchanged from 2014), RCA diff dsz, EF 55%, mild ant-apical HK >>  Med Rx  . CVA (cerebral vascular accident) ([place])   . Dementia   . History of pneumonia   . Hx of echocardiogram    a. Echo 11/14): EF 55-60%, no RWMA, grade 2 DD, mild aortic stenosis (mean 15 mmHg), mild LAE  . Hypertension   . MI (myocardial infarction) ([place]) 07/2013  . PAF (paroxysmal atrial fibrillation) ([facility])    hx of NSTEMI in setting of AF with RVR 07/2013;  Eliquis for AC  . Shingles    left chest  . Syncope   . TIA (transient ischemic attack) 07/12/2013    Past Surgical History:  Procedure Laterality Date  . CATARACT EXTRACTION Bilateral   . KYPHOPLASTY    .  LEFT HEART CATHETERIZATION WITH CORONARY ANGIOGRAM N/A 08/08/2013   Procedure: LEFT HEART CATHETERIZATION WITH CORONARY ANGIOGRAM;  Surgeon: Jettie Booze, MD;  Location: [facility] CATH LAB;  Service: Cardiovascular;  Laterality: N/A;  . LEFT HEART CATHETERIZATION WITH CORONARY ANGIOGRAM N/A 05/29/2014   Procedure: LEFT HEART CATHETERIZATION WITH CORONARY ANGIOGRAM;  Surgeon: Sinclair Grooms, MD;  Location: [facility] CATH LAB;  Service: Cardiovascular;  Laterality: N/A;  . PERCUTANEOUS STENT INTERVENTION  08/08/2013   Procedure: PERCUTANEOUS STENT INTERVENTION;  Surgeon: Jettie Booze, MD;  Location: [facility] CATH LAB;  Service: Cardiovascular;;  . SHOULDER SURGERY Left   . TONSILLECTOMY      Current Medications: Current Meds  Medication Sig  . amLODipine (NORVASC) 10 MG tablet Take 10 mg by mouth daily.  Marland Kitchen  apixaban (ELIQUIS) 5 MG TABS tablet Take 1 tablet (5 mg total) by mouth 2 (two) times daily.  Marland Kitchen atorvastatin (LIPITOR) 40 MG tablet TAKE 1 TABLET ONCE DAILY AT 6 PM.  . cholecalciferol (VITAMIN D) 1000 UNITS tablet Take 1,000 Units by mouth daily.  Marland Kitchen donepezil (ARICEPT) 10 MG tablet Take 1 tablet (10 mg total) by mouth at bedtime.  . isosorbide mononitrate (IMDUR) 30 MG 24 hr tablet Take 1 tablet (30 mg total) by mouth daily.  . metoprolol tartrate (LOPRESSOR) 25 MG tablet Take 1 tablet (25 mg total) by mouth 2 (two) times daily.  . Multiple Vitamins-Minerals (CENTRUM SILVER PO) Take 1 tablet by mouth daily.   . Multiple Vitamins-Minerals (PRESERVISION AREDS 2) CAPS Take 1 capsule by mouth 2 (two) times daily.   . nitroGLYCERIN (NITROSTAT) 0.4 MG SL tablet Place 1 tablet (0.4 mg total) under the tongue every 5 (five) minutes as needed for chest pain.  Marland Kitchen Olopatadine HCl 0.2 % SOLN Place 1 drop into both eyes daily.  Marland Kitchen omeprazole (PRILOSEC) 20 MG capsule Take 20 mg by mouth daily.  . [DISCONTINUED] NITROSTAT 0.4 MG SL tablet DISSOLVE 1 TABLET UNDER TONGUE AS NEEDED FOR CHEST PAIN,MAY REPEAT IN5 MINUTES FOR 2 DOSES.     Allergies:   Augmentin [amoxicillin-pot clavulanate]; Penicillins; and Sulfa antibiotics   Social History   Socioeconomic History  . Marital status: Widowed    Spouse name: Not on file  . Number of children: 2  . Years of education: Engineer, maintenance (IT)  . Highest education level: Not on file  Occupational History  . Occupation: retired  Scientific laboratory technician  . Financial resource strain: Not on file  . Food insecurity:    Worry: Not on file    Inability: Not on file  . Transportation needs:    Medical: Not on file    Non-medical: Not on file  Tobacco Use  . Smoking status: Never Smoker  . Smokeless tobacco: Never Used  Substance and Sexual Activity  . Alcohol use: Yes    Alcohol/week: 0.6 oz    Types: 1 Glasses of wine per week    Comment: occasionally  . Drug use: No  . Sexual  activity: Not on file  Lifestyle  . Physical activity:    Days per week: Not on file    Minutes per session: Not on file  . Stress: Not on file  Relationships  . Social connections:    Talks on phone: Not on file    Gets together: Not on file    Attends religious service: Not on file    Active member of club or organization: Not on file    Attends meetings of clubs or organizations: Not on  file    Relationship status: Not on file  Other Topics Concern  . Not on file  Social History Narrative   Patient is widowed [name]), has 2 children   Patient is right handed   Education level is 4 year degree.   Caffeine consumption is 2-4 cups daily     Family History:  The patient's family history includes Cancer in his daughter; Cancer - Lung in his brother; Diabetes in his father and mother; Gait disorder in his brother; Stroke in his sister.   ROS:   Please see the history of present illness.    Review of Systems  Constitution: Negative.  HENT: Negative.   Cardiovascular: Positive for chest pain, dyspnea on exertion and syncope.  Respiratory: Positive for cough, shortness of breath, snoring and wheezing.   Endocrine: Negative.   Hematologic/Lymphatic: Negative.   Musculoskeletal: Negative.   Gastrointestinal: Negative.   Genitourinary: Negative.   Neurological: Positive for dizziness.   All other systems reviewed and are negative.   PHYSICAL EXAM:   VS:  BP (!) 142/86   Pulse (!) 56   Ht 5\' 8"  (1.727 m)   Wt 193 lb (87.5 kg)   SpO2 97%   BMI 29.35 kg/m   Physical Exam  GEN: Well nourished, well developed, in no acute distress  Neck: no JVD, carotid bruits, or masses Cardiac:RRR; 2-7/7 harsh systolic murmur at the left sternal border Respiratory: Decreased breath sounds with mild wheezing at the left lung base otherwise clear GI: soft, nontender, nondistended, + BS Ext: without cyanosis, clubbing, or edema, Good distal pulses bilaterally Neuro:  Alert and Oriented x  3, Psych: euthymic mood, full affect  Wt Readings from Last 3 Encounters:  12/22/17 193 lb (87.5 kg)  08/03/17 197 lb (89.4 kg)  01/30/17 192 lb (87.1 kg)      Studies/Labs Reviewed:   EKG:  EKG is  ordered today.  The ekg ordered today demonstrates sinus bradycardia at 56 bpm with PACs  Recent Labs: 01/30/2017: BUN 22; Creatinine, Ser 1.49; Potassium 5.2; Sodium 141   Lipid Panel    Component Value Date/Time   CHOL 100 12/27/2014 0916   TRIG 67.0 12/27/2014 0916   HDL 29.50 (L) 12/27/2014 0916   CHOLHDL 3 12/27/2014 0916   VLDL 13.4 12/27/2014 0916   LDLCALC 57 12/27/2014 0916    Additional studies/ records that were reviewed today include:  2D echo 2017Study Conclusions   - Left ventricle: The cavity size was normal. There was mild   concentric hypertrophy. Systolic function was normal. The   estimated ejection fraction was in the range of 55% to 60%. Wall   motion was normal; there were no regional wall motion   abnormalities. Doppler parameters are consistent with abnormal   left ventricular relaxation (grade 1 diastolic dysfunction).   Doppler parameters are consistent with high ventricular filling   pressure. - Aortic valve: Moderately calcified annulus. Moderately thickened,   moderately calcified leaflets. There was mild to moderate   stenosis. Peak velocity (S): 281 cm/s. Mean gradient (S): 18 mm   Hg. Valve area (VTI): 1.26 cm^2. - Mitral valve: Calcified annulus. Normal thickness leaflets .   There was mild regurgitation. - Left atrium: The atrium was mildly dilated. - Right ventricle: Systolic function was mildly reduced. - Tricuspid valve: There was mild regurgitation. - Pulmonary arteries: PA peak pressure: 32 mm Hg (S).   Cardiac catheterization 2015    ASSESSMENT:    1. Cough   2. Atherosclerosis of  native coronary artery of native heart with other form of angina pectoris ([place])   3. Paroxysmal atrial fibrillation ([place])   4. Syncope,  unspecified syncope type   5. History of CVA (cerebrovascular accident)   6. Essential hypertension   7. Mixed hyperlipidemia   8. Aortic valve stenosis, etiology of cardiac valve disease unspecified      PLAN:  In order of problems listed above:  Cough, Upper respiratory infection with symptoms of wheezing, cough, sinus congestion probably related to allergic rhinitis.  Will check labs and chest x-ray to make sure he does not have a pneumonia.  Otherwise use Claritin, Flonase and Mucinex.  CAD status post BMS to the circumflex 2014, last cath 2015 widely patent proximal LAD and circumflex.  Circumflex stent patent, high grade disease in the first diagonal, fifth OM and PDA accounting for the high risk abnormality on NST.  Diffuse disease in nondominant RCA, preserved overall LV function.  Medical therapy recommended.  Patient's chest pain is atypical and more related to his upper respiratory symptoms and cough.  Will hold off on further testing at this time unless recurrent symptoms.  Follow-up with Dr. Tamala Julian or myself in 3 weeks.  PAF on Eliquis 5 mg twice daily.  In normal sinus rhythm today.    Syncope recurrent episode 3 weeks ago similar to prior episodes.  Family is wondering if his heart rate is too slow.  It is worrisome so will place a 48-hour monitor to rule out any significant bradycardia or other arrhythmias  History of embolic CVA on Eliquis  Essential hypertension controlled  Mixed hyperlipidemia on Lipitor last LDL 50 910/2018  Aortic stenosis moderate in 2017.  Will check 2D echo to see if this has progressed with his increased fatigue and shortness of breath  Medication Adjustments/Labs and Tests Ordered: Current medicines are reviewed at length with the patient today.  Concerns regarding medicines are outlined above.  Medication changes, Labs and Tests ordered today are listed in the Patient Instructions below. Patient Instructions  Medication Instructions:  You  should take Claritin, Mucinex and Flonase for your cold symptoms. Take as directed.  Labwork: TODAY: TSH, CBC, CMET  Testing/Procedures: Ermalinda Barrios recommends you have a CHEST XRAY. To complete this, please proceed to [place] at [facility]. No appointment necessary. Directions [address], [place], [place] [address]  Your physician has recommended that you wear a holter monitor. Holter monitors are medical devices that record the heart's electrical activity. Doctors most often use these monitors to diagnose arrhythmias. Arrhythmias are problems with the speed or rhythm of the heartbeat. The monitor is a small, portable device. You can wear one while you do your normal daily activities. This is usually used to diagnose what is causing palpitations/syncope (passing out).   Your provider has requested that you have an echocardiogram. Echocardiography is a painless test that uses sound waves to create images of your heart. It provides your doctor with information about the size and shape of your heart and how well your heart's chambers and valves are working. This procedure takes approximately one hour. There are no restrictions for this procedure.  Follow-Up: You have an appointment scheduled with Ermalinda Barrios on Tuesday, May 7 at 11:00AM. Please arrive at 10:45AM.  Any Other Special Instructions Will Be Listed Below (If Applicable).     If you need a refill on your cardiac medications before your next appointment, please call your pharmacy.      Signed,  Ermalinda Barrios, PA-C  12/22/2017 11:17 AM    [facility] [address], [place], [place]  [address] Phone: [phone]; Fax: [phone]

## 2017-12-22 NOTE — Patient Instructions (Addendum)
Medication Instructions:  You should take Claritin, Mucinex and Flonase for your cold symptoms. Take as directed.  Labwork: TODAY: TSH, CBC, CMET  Testing/Procedures: Ermalinda Barrios recommends you have a CHEST XRAY. To complete this, please proceed to [place] at [facility]. No appointment necessary. Directions [address], [place], [place] [address]  Your physician has recommended that you wear a holter monitor. Holter monitors are medical devices that record the heart's electrical activity. Doctors most often use these monitors to diagnose arrhythmias. Arrhythmias are problems with the speed or rhythm of the heartbeat. The monitor is a small, portable device. You can wear one while you do your normal daily activities. This is usually used to diagnose what is causing palpitations/syncope (passing out).   Your provider has requested that you have an echocardiogram. Echocardiography is a painless test that uses sound waves to create images of your heart. It provides your doctor with information about the size and shape of your heart and how well your heart's chambers and valves are working. This procedure takes approximately one hour. There are no restrictions for this procedure.  Follow-Up: You have an appointment scheduled with Ermalinda Barrios on Tuesday, May 7 at 11:00AM. Please arrive at 10:45AM.  Any Other Special Instructions Will Be Listed Below (If Applicable).     If you need a refill on your cardiac medications before your next appointment, please call your pharmacy.

## 2017-12-22 NOTE — Telephone Encounter (Signed)
Informed patient's daughter (who was at [facility] today) of results and verbal understanding expressed.   Instructed her to have patient START DOXYCYCLINE 100 mg BID with food for 7 days.  She understands to follow-up with Dr. Delfina Redwood for the PNA and to proceed to ED if not feeling better or has high fever or chills. She was grateful for call.  Forwarded to Dr. Delfina Redwood.

## 2017-12-23 ENCOUNTER — Ambulatory Visit: Payer: Medicare Other | Admitting: Cardiology

## 2017-12-23 ENCOUNTER — Ambulatory Visit (INDEPENDENT_AMBULATORY_CARE_PROVIDER_SITE_OTHER): Payer: Medicare Other

## 2017-12-23 DIAGNOSIS — R55 Syncope and collapse: Secondary | ICD-10-CM

## 2017-12-23 LAB — CBC WITH DIFFERENTIAL/PLATELET
BASOS: 1 %
Basophils Absolute: 0.1 10*3/uL (ref 0.0–0.2)
EOS (ABSOLUTE): 0.5 10*3/uL — ABNORMAL HIGH (ref 0.0–0.4)
EOS: 6 %
HEMOGLOBIN: 15.3 g/dL (ref 13.0–17.7)
Hematocrit: 43.2 % (ref 37.5–51.0)
IMMATURE GRANS (ABS): 0 10*3/uL (ref 0.0–0.1)
IMMATURE GRANULOCYTES: 0 %
LYMPHS: 27 %
Lymphocytes Absolute: 2.2 10*3/uL (ref 0.7–3.1)
MCH: 30.8 pg (ref 26.6–33.0)
MCHC: 35.4 g/dL (ref 31.5–35.7)
MCV: 87 fL (ref 79–97)
Monocytes Absolute: 0.6 10*3/uL (ref 0.1–0.9)
Monocytes: 8 %
NEUTROS ABS: 4.7 10*3/uL (ref 1.4–7.0)
NEUTROS PCT: 58 %
PLATELETS: 230 10*3/uL (ref 150–379)
RBC: 4.96 x10E6/uL (ref 4.14–5.80)
RDW: 13.4 % (ref 12.3–15.4)
WBC: 8.1 10*3/uL (ref 3.4–10.8)

## 2017-12-23 LAB — TSH: TSH: 1.3 u[IU]/mL (ref 0.450–4.500)

## 2017-12-23 LAB — COMPREHENSIVE METABOLIC PANEL
A/G RATIO: 1.8 (ref 1.2–2.2)
ALT: 18 IU/L (ref 0–44)
AST: 19 IU/L (ref 0–40)
Albumin: 4.6 g/dL (ref 3.5–4.7)
Alkaline Phosphatase: 106 IU/L (ref 39–117)
BUN/Creatinine Ratio: 14 (ref 10–24)
BUN: 18 mg/dL (ref 8–27)
Bilirubin Total: 0.6 mg/dL (ref 0.0–1.2)
CALCIUM: 9.8 mg/dL (ref 8.6–10.2)
CO2: 25 mmol/L (ref 20–29)
CREATININE: 1.31 mg/dL — AB (ref 0.76–1.27)
Chloride: 100 mmol/L (ref 96–106)
GFR, EST AFRICAN AMERICAN: 56 mL/min/{1.73_m2} — AB (ref >59)
GFR, EST NON AFRICAN AMERICAN: 48 mL/min/{1.73_m2} — AB (ref >59)
GLOBULIN, TOTAL: 2.5 g/dL (ref 1.5–4.5)
Glucose: 112 mg/dL — ABNORMAL HIGH (ref 65–99)
POTASSIUM: 4.6 mmol/L (ref 3.5–5.2)
SODIUM: 141 mmol/L (ref 134–144)
TOTAL PROTEIN: 7.1 g/dL (ref 6.0–8.5)

## 2017-12-25 ENCOUNTER — Other Ambulatory Visit: Payer: Self-pay

## 2017-12-25 ENCOUNTER — Ambulatory Visit (HOSPITAL_COMMUNITY): Payer: Medicare Other | Attending: Cardiovascular Disease

## 2017-12-25 DIAGNOSIS — Z9119 Patient's noncompliance with other medical treatment and regimen: Secondary | ICD-10-CM | POA: Insufficient documentation

## 2017-12-25 DIAGNOSIS — R55 Syncope and collapse: Secondary | ICD-10-CM | POA: Diagnosis not present

## 2017-12-25 DIAGNOSIS — I350 Nonrheumatic aortic (valve) stenosis: Secondary | ICD-10-CM | POA: Diagnosis not present

## 2017-12-25 DIAGNOSIS — F0390 Unspecified dementia without behavioral disturbance: Secondary | ICD-10-CM | POA: Diagnosis not present

## 2017-12-25 DIAGNOSIS — I4891 Unspecified atrial fibrillation: Secondary | ICD-10-CM | POA: Insufficient documentation

## 2017-12-25 DIAGNOSIS — G4733 Obstructive sleep apnea (adult) (pediatric): Secondary | ICD-10-CM | POA: Insufficient documentation

## 2017-12-25 DIAGNOSIS — I2510 Atherosclerotic heart disease of native coronary artery without angina pectoris: Secondary | ICD-10-CM | POA: Insufficient documentation

## 2018-01-12 ENCOUNTER — Ambulatory Visit: Payer: Medicare Other | Admitting: Physician Assistant

## 2018-01-12 DIAGNOSIS — H40053 Ocular hypertension, bilateral: Secondary | ICD-10-CM | POA: Diagnosis not present

## 2018-01-14 ENCOUNTER — Encounter: Payer: Self-pay | Admitting: Physician Assistant

## 2018-01-14 ENCOUNTER — Ambulatory Visit (INDEPENDENT_AMBULATORY_CARE_PROVIDER_SITE_OTHER): Payer: Medicare Other | Admitting: Physician Assistant

## 2018-01-14 VITALS — BP 120/72 | HR 54 | Ht 69.0 in | Wt 191.8 lb

## 2018-01-14 DIAGNOSIS — R55 Syncope and collapse: Secondary | ICD-10-CM

## 2018-01-14 DIAGNOSIS — I2510 Atherosclerotic heart disease of native coronary artery without angina pectoris: Secondary | ICD-10-CM

## 2018-01-14 DIAGNOSIS — I350 Nonrheumatic aortic (valve) stenosis: Secondary | ICD-10-CM

## 2018-01-14 DIAGNOSIS — R05 Cough: Secondary | ICD-10-CM

## 2018-01-14 DIAGNOSIS — J189 Pneumonia, unspecified organism: Secondary | ICD-10-CM | POA: Insufficient documentation

## 2018-01-14 DIAGNOSIS — I10 Essential (primary) hypertension: Secondary | ICD-10-CM

## 2018-01-14 DIAGNOSIS — I480 Paroxysmal atrial fibrillation: Secondary | ICD-10-CM

## 2018-01-14 DIAGNOSIS — R059 Cough, unspecified: Secondary | ICD-10-CM | POA: Insufficient documentation

## 2018-01-14 NOTE — Progress Notes (Signed)
Cardiology Office Note    Date:  01/14/2018   ID:  Derek [name], DOB 03-27-1927, MRN [id number]  PCP:  Seward Carol, MD  Cardiologist: Sinclair Grooms, MD  Chief Complaint  Patient presents with  . Follow-up    History of Present Illness:  Derek [name] is a 84 y.o. male with a history of CAD s/p BMS to CFx (2014), PAF on Eliquis, embolic CVA, OSA non complaint with CPAP, mild AS, CKD stage II-III dementia and previous syncope .  Patient has had progressive dementia and was having some confusion over Eliquis dose.High risk NST 2015.last cath 2015 widely patent proximal LAD and circumflex.  Circumflex stent patent, high grade disease in the first diagonal, fifth OM and PDA accounting for the high risk abnormality on NST.  Diffuse disease in nondominant RCA, preserved overall LV function.  Medical therapy recommended.  I saw the patient 12/22/2017 for chest pain.  In fact his main complaint was cough wheezing and sinus congestion.  Chest pain was atypical and I felt was related to his upper respiratory symptoms.  Chest x-ray confirmed right infrahilar opacity likely pneumonia.  I treated him with doxycycline.  He also had a syncopal episode where he got sweaty dizzy and passed out 3 weeks prior.  Was similar to his prior episodes.  Holter monitor showed normal sinus rhythm with PACs but no excessively slow rates A. fib or other arrhythmias to cause syncope.  Labs were stable last day including TSH.  2D echo normal LV function mild to moderate aortic stenosis no significant change from echo 2 years ago.  Patient comes in today accompanied by his son.  He feels 100% better.  No further chest pain.  Still has mild residual cough but it is clear and has greatly improved.  No further syncope dizziness or palpitations.    Past Medical History:  Diagnosis Date  . Broken shoulder   . Cerebrovascular disease    MRI/MRA in 2007 - 50% stenosis of supraclinoid ICA, left  . Clavicle fracture    . Complex sleep apnea syndrome    adapt SV titration EEP 6 min 6 and max 15 cm water.   . Compression fracture   . Coronary atherosclerosis of native coronary artery 07/12/2013   a. NSTEMI in setting of AF with RVR 11/14 >> BMS to CFX;  b. Nuclear (9/15):  High risk - apical lateral ischemia >> c. LHC (9/15):  pLAD 50%, D1 (smaller br 90%/larger br 80%, pDx 50-60%, dLAD 50%, prox-mid CFX stent ok, pOM1 and OM2 50-70% (jailed by stent), OM5 70-80%, mPDA 90% (CFX dsz unchanged from 2014), RCA diff dsz, EF 55%, mild ant-apical HK >>  Med Rx  . CVA (cerebral vascular accident) ([place])   . Dementia   . History of pneumonia   . Hx of echocardiogram    a. Echo 11/14): EF 55-60%, no RWMA, grade 2 DD, mild aortic stenosis (mean 15 mmHg), mild LAE  . Hypertension   . MI (myocardial infarction) ([place]) 07/2013  . PAF (paroxysmal atrial fibrillation) (HCC)    hx of NSTEMI in setting of AF with RVR 07/2013;  Eliquis for AC  . Shingles    left chest  . Syncope   . TIA (transient ischemic attack) 07/12/2013    Past Surgical History:  Procedure Laterality Date  . CATARACT EXTRACTION Bilateral   . KYPHOPLASTY    . LEFT HEART CATHETERIZATION WITH CORONARY ANGIOGRAM N/A 08/08/2013   Procedure: LEFT HEART CATHETERIZATION  WITH CORONARY ANGIOGRAM;  Surgeon: Jettie Booze, MD;  Location: [facility] CATH LAB;  Service: Cardiovascular;  Laterality: N/A;  . LEFT HEART CATHETERIZATION WITH CORONARY ANGIOGRAM N/A 05/29/2014   Procedure: LEFT HEART CATHETERIZATION WITH CORONARY ANGIOGRAM;  Surgeon: Sinclair Grooms, MD;  Location: [facility] CATH LAB;  Service: Cardiovascular;  Laterality: N/A;  . PERCUTANEOUS STENT INTERVENTION  08/08/2013   Procedure: PERCUTANEOUS STENT INTERVENTION;  Surgeon: Jettie Booze, MD;  Location: [facility] CATH LAB;  Service: Cardiovascular;;  . SHOULDER SURGERY Left   . TONSILLECTOMY      Current Medications: Current Meds  Medication Sig  . amLODipine (NORVASC) 10 MG tablet Take 10 mg by mouth  daily.  Marland Kitchen apixaban (ELIQUIS) 5 MG TABS tablet Take 1 tablet (5 mg total) by mouth 2 (two) times daily.  Marland Kitchen atorvastatin (LIPITOR) 40 MG tablet TAKE 1 TABLET ONCE DAILY AT 6 PM.  . cholecalciferol (VITAMIN D) 1000 UNITS tablet Take 1,000 Units by mouth daily.  Marland Kitchen donepezil (ARICEPT) 10 MG tablet Take 1 tablet (10 mg total) by mouth at bedtime.  . isosorbide mononitrate (IMDUR) 30 MG 24 hr tablet Take 1 tablet (30 mg total) by mouth daily.  . metoprolol tartrate (LOPRESSOR) 25 MG tablet Take 1 tablet (25 mg total) by mouth 2 (two) times daily.  . Multiple Vitamins-Minerals (CENTRUM SILVER PO) Take 1 tablet by mouth daily.   . Multiple Vitamins-Minerals (PRESERVISION AREDS 2) CAPS Take 1 capsule by mouth 2 (two) times daily.   . nitroGLYCERIN (NITROSTAT) 0.4 MG SL tablet Place 1 tablet (0.4 mg total) under the tongue every 5 (five) minutes as needed for chest pain.  Marland Kitchen omeprazole (PRILOSEC) 20 MG capsule Take 20 mg by mouth daily.     Allergies:   Augmentin [amoxicillin-pot clavulanate]; Penicillins; and Sulfa antibiotics   Social History   Socioeconomic History  . Marital status: Widowed    Spouse name: Not on file  . Number of children: 2  . Years of education: Engineer, maintenance (IT)  . Highest education level: Not on file  Occupational History  . Occupation: retired  Scientific laboratory technician  . Financial resource strain: Not on file  . Food insecurity:    Worry: Not on file    Inability: Not on file  . Transportation needs:    Medical: Not on file    Non-medical: Not on file  Tobacco Use  . Smoking status: Never Smoker  . Smokeless tobacco: Never Used  Substance and Sexual Activity  . Alcohol use: Yes    Alcohol/week: 0.6 oz    Types: 1 Glasses of wine per week    Comment: occasionally  . Drug use: No  . Sexual activity: Not on file  Lifestyle  . Physical activity:    Days per week: Not on file    Minutes per session: Not on file  . Stress: Not on file  Relationships  . Social connections:    Talks  on phone: Not on file    Gets together: Not on file    Attends religious service: Not on file    Active member of club or organization: Not on file    Attends meetings of clubs or organizations: Not on file    Relationship status: Not on file  Other Topics Concern  . Not on file  Social History Narrative   Patient is widowed [name]), has 2 children   Patient is right handed   Education level is 4 year degree.   Caffeine consumption is 2-4 cups  daily     Family History:  The patient's family history includes Cancer in his daughter; Cancer - Lung in his brother; Diabetes in his father and mother; Gait disorder in his brother; Stroke in his sister.   ROS:   Please see the history of present illness.    Review of Systems  Constitution: Negative.  HENT: Negative.   Cardiovascular: Negative.   Respiratory: Positive for cough.   Endocrine: Negative.   Hematologic/Lymphatic: Negative.   Musculoskeletal: Negative.   Gastrointestinal: Negative.   Genitourinary: Negative.   Neurological: Positive for loss of balance.   All other systems reviewed and are negative.   PHYSICAL EXAM:   VS:  BP 120/72   Pulse (!) 54   Ht 5\' 9"  (1.753 m)   Wt 191 lb 12.8 oz (87 kg)   SpO2 96%   BMI 28.32 kg/m   Physical Exam  GEN: Well nourished, well developed, in no acute distress  Neck: no JVD, carotid bruits, or masses Cardiac:RRR; 2/6 harsh systolic murmur at the right sternal border Respiratory:  clear to auscultation bilaterally, normal work of breathing no further wheezing GI: soft, nontender, nondistended, + BS Ext: without cyanosis, clubbing, or edema, Good distal pulses bilaterally Neuro:  Alert and Oriented x 3 Psych: euthymic mood, full affect  Wt Readings from Last 3 Encounters:  01/14/18 191 lb 12.8 oz (87 kg)  12/22/17 193 lb (87.5 kg)  08/03/17 197 lb (89.4 kg)      Studies/Labs Reviewed:   EKG:  EKG is not ordered today.   Recent Labs: 12/22/2017: ALT 18; BUN 18;  Creatinine, Ser 1.31; Hemoglobin 15.3; Platelets 230; Potassium 4.6; Sodium 141; TSH 1.300   Lipid Panel    Component Value Date/Time   CHOL 100 12/27/2014 0916   TRIG 67.0 12/27/2014 0916   HDL 29.50 (L) 12/27/2014 0916   CHOLHDL 3 12/27/2014 0916   VLDL 13.4 12/27/2014 0916   LDLCALC 57 12/27/2014 0916    Additional studies/ records that were reviewed today include:   Holter monitor 4/22/2019Normal sinus rhythm  PACs accounting for up to 6% of cardiac activity.  Rare PVCs  No atrial fibrillation or ventricular tachycardia noted  No excessive bradycardia.   Normal sinus rhythm PACs Rare PVCs No atrial fib, excessive bradycardia, or significant ventricular ectopy.   HEART RATE EPISODES Minimum HR: 45 BPM at 12:22:21 PM Maximum HR: 111 BPM at 7:52:44 AM Average HR: 61 BPM    Chest x-ray 4/22/2019IMPRESSION: Right infrahilar opacity, likely pneumonia.   These results will be called to the ordering clinician or representative by the Radiologist Assistant, and communication documented in the PACS or zVision Dashboard.    Echo 4/19/19Study Conclusions   - Left ventricle: The cavity size was normal. Wall thickness was   increased in a pattern of mild LVH. Systolic function was normal.   The estimated ejection fraction was in the range of 55% to 60%.   Left ventricular diastolic function parameters were normal. - Aortic valve: Gradient similar to 12/2015 when mean gradient 18   mmHg and peak 28 mmHg. There was mild to moderate stenosis. Valve   area (VTI): 1.29 cm^2. Valve area (Vmax): 1.23 cm^2. Valve area   (Vmean): 1.21 cm^2. - Mitral valve: Calcified annulus. Moderately thickened, moderately   calcified leaflets . Valve area by pressure half-time: 2.24 cm^2. - Left atrium: The atrium was mildly dilated. - Atrial septum: No defect or patent foramen ovale was identified.     ASSESSMENT:  1. Cough   2. Syncope, unspecified syncope type   3. Atherosclerosis  of native coronary artery of native heart without angina pectoris   4. Paroxysmal atrial fibrillation (HCC)   5. Essential hypertension   6. Aortic valve stenosis, etiology of cardiac valve disease unspecified      PLAN:  In order of problems listed above:  Cough secondary to pneumonia treated with doxycycline greatly improved.  Still has mild residual cough but improving daily, no fever, will follow up with Dr.Polite.  Syncope long history of and recent Holter monitor did not show any A. fib or significant arrhythmia.  2D echo normal LV EF with mild to moderate aortic stenosis it has not progressed since 2017.  CAD status post BMS to the circumflex 2014, last cath widely patent proximal LAD and circumflex.  Patent circumflex stent, high-grade disease in the first diagonal, fifth OM and PDA accounting for high risk abnormality on NST.  Diffuse disease in the nondominant RCA and preserved LV function.  Medical therapy recommended.  No further chest pain felt to be atypical related to pneumonia.  PAF on Eliquis 5 mg twice daily no A. fib on recent Holter.  History of embolic CVA on Eliquis  Essential hypertension blood pressure well controlled.  Mixed hyperlipidemia on Lipitor  Aortic stenosis mild to moderate on recent echo.  Unchanged since 2017.  Medication Adjustments/Labs and Tests Ordered: Current medicines are reviewed at length with the patient today.  Concerns regarding medicines are outlined above.  Medication changes, Labs and Tests ordered today are listed in the Patient Instructions below. Patient Instructions  Medication Instructions:  Your physician recommends that you continue on your current medications as directed. Please refer to the Current Medication list given to you today.  If you need a refill on your cardiac medications, please contact your pharmacy first.  Labwork: None ordered   Testing/Procedures: None ordered   Follow-Up: Your physician wants you to  follow-up in: 6 months with Dr. Tamala Julian. You will receive a reminder letter in the mail two months in advance. If you don't receive a letter, please call our office to schedule the follow-up appointment.  Any Other Special Instructions Will Be Listed Below (If Applicable).   Thank you for choosing [facility]    [phone]  If you need a refill on your cardiac medications before your next appointment, please call your pharmacy.      Sumner Boast, PA-C  01/14/2018 10:52 AM    [facility] [address], [place], [place]  [address] Phone: [phone]; Fax: [phone]

## 2018-01-14 NOTE — Patient Instructions (Signed)
Medication Instructions:  Your physician recommends that you continue on your current medications as directed. Please refer to the Current Medication list given to you today.  If you need a refill on your cardiac medications, please contact your pharmacy first.  Labwork: None ordered   Testing/Procedures: None ordered   Follow-Up: Your physician wants you to follow-up in: 6 months with Dr. Tamala Julian. You will receive a reminder letter in the mail two months in advance. If you don't receive a letter, please call our office to schedule the follow-up appointment.  Any Other Special Instructions Will Be Listed Below (If Applicable).   Thank you for choosing [facility]    [phone]  If you need a refill on your cardiac medications before your next appointment, please call your pharmacy.

## 2018-01-20 ENCOUNTER — Other Ambulatory Visit: Payer: Self-pay | Admitting: Interventional Cardiology

## 2018-01-20 ENCOUNTER — Ambulatory Visit: Payer: Medicare Other | Admitting: Cardiology

## 2018-02-16 ENCOUNTER — Encounter: Payer: Self-pay | Admitting: Podiatry

## 2018-02-16 ENCOUNTER — Ambulatory Visit (INDEPENDENT_AMBULATORY_CARE_PROVIDER_SITE_OTHER): Payer: Medicare Other | Admitting: Podiatry

## 2018-02-16 DIAGNOSIS — D689 Coagulation defect, unspecified: Secondary | ICD-10-CM | POA: Diagnosis not present

## 2018-02-16 DIAGNOSIS — M79676 Pain in unspecified toe(s): Secondary | ICD-10-CM

## 2018-02-16 DIAGNOSIS — B351 Tinea unguium: Secondary | ICD-10-CM

## 2018-02-16 NOTE — Progress Notes (Signed)
Patient ID: Derek [name], male   DOB: May 02, 1927, 84 y.o.   MRN: [id number] Complaint:  Visit Type: Patient returns to my office for continued preventative foot care services. Complaint: Patient states" my nails have grown long and thick and become painful to walk and wear shoes" . The patient presents for preventative foot care services. No changes to ROS  Podiatric Exam: Vascular: dorsalis pedis and posterior tibial pulses are not  palpable bilateral. Capillary return is immediate. Temperature gradient is WNL. Skin turgor WNL  Sensorium: Normal Semmes Weinstein monofilament test. Normal tactile sensation bilaterally. Nail Exam: Pt has thick disfigured discolored nails with subungual debris noted bilateral entire nail hallux through fifth toenails Ulcer Exam: There is no evidence of ulcer or pre-ulcerative changes or infection. Orthopedic Exam: Muscle tone and strength are WNL. No limitations in general ROM. No crepitus or effusions noted. Foot type and digits show no abnormalities. Bony prominences are unremarkable. Skin: No Porokeratosis. No infection or ulcers.    Diagnosis:  Onychomycosis, , Pain in right toe, pain in left toes.    Treatment & Plan Procedures and Treatment: Consent by patient was obtained for treatment procedures. The patient understood the discussion of treatment and procedures well. All questions were answered thoroughly reviewed. Debridement of mycotic and hypertrophic toenails, 1 through 5 bilateral and clearing of subungual debris. No ulceration, no infection noted.  ABN signed for 2019.   Return Visit-Office Procedure: Patient instructed to return to the office for a follow up visit 10 weeks. for continued evaluation and treatment.     Gardiner Barefoot DPM

## 2018-02-18 ENCOUNTER — Other Ambulatory Visit: Payer: Self-pay | Admitting: Interventional Cardiology

## 2018-02-18 ENCOUNTER — Other Ambulatory Visit: Payer: Self-pay | Admitting: Nurse Practitioner

## 2018-02-18 NOTE — Telephone Encounter (Signed)
Eliquis 5mg  refill request received; pt is 84 yrs old, wt-87kg, Crea-1.31 on 12/22/17, last seen by Estella Husk on 01/14/18; will send in refill to requested pharmacy.

## 2018-02-19 ENCOUNTER — Emergency Department (HOSPITAL_COMMUNITY): Payer: Medicare Other

## 2018-02-19 ENCOUNTER — Inpatient Hospital Stay (HOSPITAL_COMMUNITY)
Admission: EM | Admit: 2018-02-19 | Discharge: 2018-02-21 | DRG: 101 | Disposition: A | Discharge status: Home Health | Payer: Medicare Other | Attending: Family Medicine | Admitting: Family Medicine

## 2018-02-19 ENCOUNTER — Other Ambulatory Visit: Payer: Self-pay

## 2018-02-19 ENCOUNTER — Encounter (HOSPITAL_COMMUNITY): Payer: Self-pay | Admitting: *Deleted

## 2018-02-19 DIAGNOSIS — R509 Fever, unspecified: Secondary | ICD-10-CM | POA: Diagnosis not present

## 2018-02-19 DIAGNOSIS — R0902 Hypoxemia: Secondary | ICD-10-CM | POA: Diagnosis not present

## 2018-02-19 DIAGNOSIS — G8384 Todd's paralysis (postepileptic): Secondary | ICD-10-CM | POA: Diagnosis present

## 2018-02-19 DIAGNOSIS — Z66 Do not resuscitate: Secondary | ICD-10-CM | POA: Diagnosis present

## 2018-02-19 DIAGNOSIS — G4733 Obstructive sleep apnea (adult) (pediatric): Secondary | ICD-10-CM | POA: Diagnosis present

## 2018-02-19 DIAGNOSIS — I480 Paroxysmal atrial fibrillation: Secondary | ICD-10-CM | POA: Diagnosis present

## 2018-02-19 DIAGNOSIS — F0390 Unspecified dementia without behavioral disturbance: Secondary | ICD-10-CM | POA: Diagnosis present

## 2018-02-19 DIAGNOSIS — Z9842 Cataract extraction status, left eye: Secondary | ICD-10-CM | POA: Diagnosis not present

## 2018-02-19 DIAGNOSIS — G309 Alzheimer's disease, unspecified: Secondary | ICD-10-CM | POA: Diagnosis not present

## 2018-02-19 DIAGNOSIS — I252 Old myocardial infarction: Secondary | ICD-10-CM

## 2018-02-19 DIAGNOSIS — R32 Unspecified urinary incontinence: Secondary | ICD-10-CM | POA: Diagnosis present

## 2018-02-19 DIAGNOSIS — I63233 Cerebral infarction due to unspecified occlusion or stenosis of bilateral carotid arteries: Secondary | ICD-10-CM | POA: Diagnosis not present

## 2018-02-19 DIAGNOSIS — R4701 Aphasia: Secondary | ICD-10-CM | POA: Diagnosis present

## 2018-02-19 DIAGNOSIS — R569 Unspecified convulsions: Secondary | ICD-10-CM | POA: Diagnosis not present

## 2018-02-19 DIAGNOSIS — A419 Sepsis, unspecified organism: Secondary | ICD-10-CM

## 2018-02-19 DIAGNOSIS — Z9119 Patient's noncompliance with other medical treatment and regimen: Secondary | ICD-10-CM | POA: Diagnosis not present

## 2018-02-19 DIAGNOSIS — R29710 NIHSS score 10: Secondary | ICD-10-CM | POA: Diagnosis present

## 2018-02-19 DIAGNOSIS — Z9841 Cataract extraction status, right eye: Secondary | ICD-10-CM

## 2018-02-19 DIAGNOSIS — I639 Cerebral infarction, unspecified: Secondary | ICD-10-CM | POA: Diagnosis not present

## 2018-02-19 DIAGNOSIS — R911 Solitary pulmonary nodule: Secondary | ICD-10-CM

## 2018-02-19 DIAGNOSIS — Z8673 Personal history of transient ischemic attack (TIA), and cerebral infarction without residual deficits: Secondary | ICD-10-CM

## 2018-02-19 DIAGNOSIS — Z79899 Other long term (current) drug therapy: Secondary | ICD-10-CM | POA: Diagnosis not present

## 2018-02-19 DIAGNOSIS — F0280 Dementia in other diseases classified elsewhere without behavioral disturbance: Secondary | ICD-10-CM | POA: Diagnosis not present

## 2018-02-19 DIAGNOSIS — Z880 Allergy status to penicillin: Secondary | ICD-10-CM | POA: Diagnosis not present

## 2018-02-19 DIAGNOSIS — S22010A Wedge compression fracture of first thoracic vertebra, initial encounter for closed fracture: Secondary | ICD-10-CM | POA: Diagnosis present

## 2018-02-19 DIAGNOSIS — R402441 Other coma, without documented Glasgow coma scale score, or with partial score reported, in the field [EMT or ambulance]: Secondary | ICD-10-CM | POA: Diagnosis not present

## 2018-02-19 DIAGNOSIS — N183 Chronic kidney disease, stage 3 (moderate): Secondary | ICD-10-CM | POA: Diagnosis not present

## 2018-02-19 DIAGNOSIS — I4891 Unspecified atrial fibrillation: Secondary | ICD-10-CM | POA: Diagnosis present

## 2018-02-19 DIAGNOSIS — G40409 Other generalized epilepsy and epileptic syndromes, not intractable, without status epilepticus: Secondary | ICD-10-CM | POA: Diagnosis not present

## 2018-02-19 DIAGNOSIS — Z882 Allergy status to sulfonamides status: Secondary | ICD-10-CM | POA: Diagnosis not present

## 2018-02-19 DIAGNOSIS — N1831 Chronic kidney disease, stage 3a: Secondary | ICD-10-CM

## 2018-02-19 DIAGNOSIS — IMO0001 Reserved for inherently not codable concepts without codable children: Secondary | ICD-10-CM

## 2018-02-19 DIAGNOSIS — R4182 Altered mental status, unspecified: Secondary | ICD-10-CM | POA: Diagnosis not present

## 2018-02-19 DIAGNOSIS — R4020 Unspecified coma: Secondary | ICD-10-CM | POA: Diagnosis not present

## 2018-02-19 DIAGNOSIS — G40909 Epilepsy, unspecified, not intractable, without status epilepticus: Secondary | ICD-10-CM | POA: Diagnosis not present

## 2018-02-19 DIAGNOSIS — I10 Essential (primary) hypertension: Secondary | ICD-10-CM | POA: Diagnosis not present

## 2018-02-19 DIAGNOSIS — Z7901 Long term (current) use of anticoagulants: Secondary | ICD-10-CM | POA: Diagnosis not present

## 2018-02-19 DIAGNOSIS — Z955 Presence of coronary angioplasty implant and graft: Secondary | ICD-10-CM

## 2018-02-19 DIAGNOSIS — R531 Weakness: Secondary | ICD-10-CM | POA: Diagnosis not present

## 2018-02-19 HISTORY — DX: Solitary pulmonary nodule: R91.1

## 2018-02-19 HISTORY — DX: Wedge compression fracture of first thoracic vertebra, initial encounter for closed fracture: S22.010A

## 2018-02-19 LAB — ETHANOL

## 2018-02-19 LAB — DIFFERENTIAL
Abs Immature Granulocytes: 0.1 10*3/uL (ref 0.0–0.1)
BASOS ABS: 0.1 10*3/uL (ref 0.0–0.1)
BASOS PCT: 0 %
EOS ABS: 0.1 10*3/uL (ref 0.0–0.7)
EOS PCT: 1 %
Immature Granulocytes: 1 %
Lymphocytes Relative: 5 %
Lymphs Abs: 0.9 10*3/uL (ref 0.7–4.0)
MONO ABS: 1.4 10*3/uL — AB (ref 0.1–1.0)
MONOS PCT: 7 %
NEUTROS PCT: 86 %
Neutro Abs: 17.9 10*3/uL — ABNORMAL HIGH (ref 1.7–7.7)

## 2018-02-19 LAB — CBC
HCT: 45.1 % (ref 39.0–52.0)
HEMOGLOBIN: 15.2 g/dL (ref 13.0–17.0)
MCH: 29.2 pg (ref 26.0–34.0)
MCHC: 33.7 g/dL (ref 30.0–36.0)
MCV: 86.7 fL (ref 78.0–100.0)
PLATELETS: 200 10*3/uL (ref 150–400)
RBC: 5.2 MIL/uL (ref 4.22–5.81)
RDW: 12.6 % (ref 11.5–15.5)
WBC: 20.5 10*3/uL — ABNORMAL HIGH (ref 4.0–10.5)

## 2018-02-19 LAB — VITAMIN B12: Vitamin B-12: 659 pg/mL (ref 180–914)

## 2018-02-19 LAB — RAPID URINE DRUG SCREEN, HOSP PERFORMED
Amphetamines: NOT DETECTED
BENZODIAZEPINES: NOT DETECTED
COCAINE: NOT DETECTED
OPIATES: NOT DETECTED
Tetrahydrocannabinol: NOT DETECTED

## 2018-02-19 LAB — I-STAT CHEM 8, ED
BUN: 18 mg/dL (ref 6–20)
CALCIUM ION: 1.11 mmol/L — AB (ref 1.15–1.40)
CHLORIDE: 100 mmol/L — AB (ref 101–111)
Creatinine, Ser: 1.2 mg/dL (ref 0.61–1.24)
Glucose, Bld: 129 mg/dL — ABNORMAL HIGH (ref 65–99)
HCT: 46 % (ref 39.0–52.0)
Hemoglobin: 15.6 g/dL (ref 13.0–17.0)
POTASSIUM: 4.1 mmol/L (ref 3.5–5.1)
SODIUM: 136 mmol/L (ref 135–145)
TCO2: 22 mmol/L (ref 22–32)

## 2018-02-19 LAB — URINALYSIS, ROUTINE W REFLEX MICROSCOPIC
Bacteria, UA: NONE SEEN
Bilirubin Urine: NEGATIVE
Glucose, UA: NEGATIVE mg/dL
HGB URINE DIPSTICK: NEGATIVE
Ketones, ur: NEGATIVE mg/dL
Leukocytes, UA: NEGATIVE
Nitrite: NEGATIVE
Protein, ur: 30 mg/dL — AB
Specific Gravity, Urine: 1.044 — ABNORMAL HIGH (ref 1.005–1.030)
pH: 5 (ref 5.0–8.0)

## 2018-02-19 LAB — COMPREHENSIVE METABOLIC PANEL
ALT: 22 U/L (ref 17–63)
ANION GAP: 10 (ref 5–15)
AST: 32 U/L (ref 15–41)
Albumin: 3.9 g/dL (ref 3.5–5.0)
Alkaline Phosphatase: 87 U/L (ref 38–126)
BUN: 16 mg/dL (ref 6–20)
CALCIUM: 9.6 mg/dL (ref 8.9–10.3)
CHLORIDE: 102 mmol/L (ref 101–111)
CO2: 25 mmol/L (ref 22–32)
Creatinine, Ser: 1.43 mg/dL — ABNORMAL HIGH (ref 0.61–1.24)
GFR, EST AFRICAN AMERICAN: 49 mL/min — AB (ref >60)
GFR, EST NON AFRICAN AMERICAN: 42 mL/min — AB (ref >60)
Glucose, Bld: 136 mg/dL — ABNORMAL HIGH (ref 65–99)
Potassium: 4.6 mmol/L (ref 3.5–5.1)
SODIUM: 137 mmol/L (ref 135–145)
Total Bilirubin: 1.1 mg/dL (ref 0.3–1.2)
Total Protein: 7.2 g/dL (ref 6.5–8.1)

## 2018-02-19 LAB — I-STAT CG4 LACTIC ACID, ED: LACTIC ACID, VENOUS: 2.21 mmol/L — AB (ref 0.5–1.9)

## 2018-02-19 LAB — APTT: APTT: 44 s — AB (ref 24–36)

## 2018-02-19 LAB — PROTIME-INR
INR: 1.25
PROTHROMBIN TIME: 15.6 s — AB (ref 11.4–15.2)

## 2018-02-19 LAB — CBG MONITORING, ED: GLUCOSE-CAPILLARY: 137 mg/dL — AB (ref 65–99)

## 2018-02-19 LAB — I-STAT TROPONIN, ED: Troponin i, poc: 0.01 ng/mL (ref 0.00–0.08)

## 2018-02-19 LAB — TSH: TSH: 0.565 u[IU]/mL (ref 0.350–4.500)

## 2018-02-19 MED ORDER — VANCOMYCIN HCL 10 G IV SOLR
2000.0000 mg | Freq: Once | INTRAVENOUS | Status: AC
  Administered 2018-02-20: 2000 mg via INTRAVENOUS
  Filled 2018-02-19: qty 2000

## 2018-02-19 MED ORDER — STROKE: EARLY STAGES OF RECOVERY BOOK
Freq: Once | Status: AC
  Administered 2018-02-20
  Filled 2018-02-19: qty 1

## 2018-02-19 MED ORDER — LEVETIRACETAM IN NACL 500 MG/100ML IV SOLN
500.0000 mg | Freq: Two times a day (BID) | INTRAVENOUS | Status: DC
  Administered 2018-02-20 (×3): 500 mg via INTRAVENOUS
  Filled 2018-02-19 (×4): qty 100

## 2018-02-19 MED ORDER — VANCOMYCIN HCL IN DEXTROSE 1-5 GM/200ML-% IV SOLN: 1000.0000 mg | Freq: Once | INTRAVENOUS | Status: DC

## 2018-02-19 MED ORDER — IOPAMIDOL (ISOVUE-370) INJECTION 76%
100.0000 mL | Freq: Once | INTRAVENOUS | Status: AC | PRN
  Administered 2018-02-19: 100 mL via INTRAVENOUS

## 2018-02-19 MED ORDER — ACETAMINOPHEN 325 MG PO TABS: 650.0000 mg | ORAL_TABLET | ORAL | Status: DC | PRN

## 2018-02-19 MED ORDER — SODIUM CHLORIDE 0.9 % IV SOLN
2.0000 g | Freq: Once | INTRAVENOUS | Status: AC
  Administered 2018-02-19: 2 g via INTRAVENOUS
  Filled 2018-02-19: qty 2

## 2018-02-19 MED ORDER — HYDRALAZINE HCL 20 MG/ML IJ SOLN: 10.0000 mg | INTRAMUSCULAR | Status: DC | PRN

## 2018-02-19 MED ORDER — LEVOFLOXACIN IN D5W 750 MG/150ML IV SOLN: 750.0000 mg | INTRAVENOUS | Status: DC

## 2018-02-19 MED ORDER — VANCOMYCIN HCL 10 G IV SOLR: 1250.0000 mg | INTRAVENOUS | Status: DC

## 2018-02-19 MED ORDER — SODIUM CHLORIDE 0.9 % IV BOLUS
1000.0000 mL | Freq: Once | INTRAVENOUS | Status: AC
Start: 2018-02-19 — End: 2018-02-19
  Administered 2018-02-19: 1000 mL via INTRAVENOUS

## 2018-02-19 MED ORDER — METOPROLOL TARTRATE 5 MG/5ML IV SOLN
2.5000 mg | Freq: Three times a day (TID) | INTRAVENOUS | Status: DC
  Administered 2018-02-20 (×2): 2.5 mg via INTRAVENOUS
  Filled 2018-02-19 (×2): qty 5

## 2018-02-19 MED ORDER — IOPAMIDOL (ISOVUE-370) INJECTION 76%
50.0000 mL | Freq: Once | INTRAVENOUS | Status: AC | PRN
  Administered 2018-02-19: 50 mL via INTRAVENOUS

## 2018-02-19 MED ORDER — LEVETIRACETAM 500 MG PO TABS: 500.0000 mg | ORAL_TABLET | Freq: Two times a day (BID) | ORAL | Status: DC

## 2018-02-19 MED ORDER — LORAZEPAM 2 MG/ML IJ SOLN
INTRAMUSCULAR | Status: AC
  Filled 2018-02-19: qty 1

## 2018-02-19 MED ORDER — LEVOFLOXACIN IN D5W 750 MG/150ML IV SOLN
750.0000 mg | Freq: Once | INTRAVENOUS | Status: AC
  Administered 2018-02-19: 750 mg via INTRAVENOUS
  Filled 2018-02-19: qty 150

## 2018-02-19 MED ORDER — ACETAMINOPHEN 650 MG RE SUPP
650.0000 mg | Freq: Once | RECTAL | Status: AC
  Administered 2018-02-19: 650 mg via RECTAL
  Filled 2018-02-19: qty 1

## 2018-02-19 MED ORDER — ACETAMINOPHEN 650 MG RE SUPP: 650.0000 mg | RECTAL | Status: DC | PRN

## 2018-02-19 MED ORDER — ACETAMINOPHEN 160 MG/5ML PO SOLN: 650.0000 mg | ORAL | Status: DC | PRN

## 2018-02-19 MED ORDER — SODIUM CHLORIDE 0.9 % IV SOLN: INTRAVENOUS | Status: DC

## 2018-02-19 MED ORDER — LEVETIRACETAM IN NACL 1000 MG/100ML IV SOLN
1000.0000 mg | Freq: Once | INTRAVENOUS | Status: AC
  Administered 2018-02-19: 1000 mg via INTRAVENOUS
  Filled 2018-02-19 (×2): qty 100

## 2018-02-19 MED ORDER — SODIUM CHLORIDE 0.9 % IV SOLN
2.0000 g | Freq: Three times a day (TID) | INTRAVENOUS | Status: DC
  Administered 2018-02-20: 2 g via INTRAVENOUS
  Filled 2018-02-19 (×2): qty 2

## 2018-02-19 NOTE — H&P (Signed)
History and Physical    Derek [name] JJO:[id number] DOB: November 17, 1926 DOA: 02/19/2018  PCP: Seward Carol, MD  Patient coming from: Home.  Chief Complaint: Unresponsive episode.  History obtained from patient's daughter.  HPI: Derek [name] is a 84 y.o. male with history of dementia, paroxysmal atrial fibrillation, CAD, sleep apnea noncompliant with CPAP hypertension chronic kidney disease was sitting in his house on the porch when patient's son at around 1 PM found patient to be unresponsive and had incontinence of urine.  EMS was called patient was found to be weak on the right side.  Was brought in as code stroke.  Exact time of onset is not clear.  In the ER patient was febrile with temperature 102 F.  ED Course: Patient appeared confused and was repeating his name.  While in the CT scan the patient had a generalized tonic-clonic seizure which lasted for less than a minute.  Before any intervention.  Patient was given Ativan followed by Keppra loading dose.  CT angiogram of the head and neck was unremarkable.  Did show a T1 compression fracture.  On-call neurology was consulted.  Patient symptoms are likely from seizures.  Patient was febrile and tachycardic concerning for sepsis blood cultures obtained and started on empiric antibiotic chest x-ray and UA were unremarkable but on my exam patient is back to baseline mental status.  Review of Systems: As per HPI, rest all negative.   Past Medical History:  Diagnosis Date  . Broken shoulder   . Cerebrovascular disease    MRI/MRA in 2007 - 50% stenosis of supraclinoid ICA, left  . Clavicle fracture   . Complex sleep apnea syndrome    adapt SV titration EEP 6 min 6 and max 15 cm water.   . Compression fracture   . Coronary atherosclerosis of native coronary artery 07/12/2013   a. NSTEMI in setting of AF with RVR 11/14 >> BMS to CFX;  b. Nuclear (9/15):  High risk - apical lateral ischemia >> c. LHC (9/15):  pLAD 50%, D1 (smaller br  90%/larger br 80%, pDx 50-60%, dLAD 50%, prox-mid CFX stent ok, pOM1 and OM2 50-70% (jailed by stent), OM5 70-80%, mPDA 90% (CFX dsz unchanged from 2014), RCA diff dsz, EF 55%, mild ant-apical HK >>  Med Rx  . CVA (cerebral vascular accident) ([place])   . Dementia   . History of pneumonia   . Hx of echocardiogram    a. Echo 11/14): EF 55-60%, no RWMA, grade 2 DD, mild aortic stenosis (mean 15 mmHg), mild LAE  . Hypertension   . MI (myocardial infarction) ([place]) 07/2013  . PAF (paroxysmal atrial fibrillation) ([facility])    hx of NSTEMI in setting of AF with RVR 07/2013;  Eliquis for AC  . Shingles    left chest  . Syncope   . TIA (transient ischemic attack) 07/12/2013    Past Surgical History:  Procedure Laterality Date  . CATARACT EXTRACTION Bilateral   . KYPHOPLASTY    . LEFT HEART CATHETERIZATION WITH CORONARY ANGIOGRAM N/A 08/08/2013   Procedure: LEFT HEART CATHETERIZATION WITH CORONARY ANGIOGRAM;  Surgeon: Jettie Booze, MD;  Location: [facility] CATH LAB;  Service: Cardiovascular;  Laterality: N/A;  . LEFT HEART CATHETERIZATION WITH CORONARY ANGIOGRAM N/A 05/29/2014   Procedure: LEFT HEART CATHETERIZATION WITH CORONARY ANGIOGRAM;  Surgeon: Sinclair Grooms, MD;  Location: [facility] CATH LAB;  Service: Cardiovascular;  Laterality: N/A;  . PERCUTANEOUS STENT INTERVENTION  08/08/2013   Procedure: PERCUTANEOUS STENT INTERVENTION;  Surgeon:  Jettie Booze, MD;  Location: [facility] CATH LAB;  Service: Cardiovascular;;  . SHOULDER SURGERY Left   . TONSILLECTOMY       reports that he has never smoked. He has never used smokeless tobacco. He reports that he drinks about 0.6 oz of alcohol per week. He reports that he does not use drugs.  Allergies  Allergen Reactions  . Augmentin [Amoxicillin-Pot Clavulanate] Rash  . Penicillins Rash    Has patient had a PCN reaction causing immediate rash, facial/tongue/throat swelling, SOB or lightheadedness with hypotension: Yes Has patient had a PCN reaction causing  severe rash involving mucus membranes or skin necrosis: Unk Has patient had a PCN reaction that required hospitalization: Yes Has patient had a PCN reaction occurring within the last 10 years: No If all of the above answers are "NO", then may proceed with Cephalosporin use.   . Sulfa Antibiotics Rash    Family History  Problem Relation Age of Onset  . Diabetes Mother   . Diabetes Father   . Cancer - Lung Brother   . Gait disorder Brother   . Cancer Daughter   . Stroke Sister   . Heart attack Neg Hx     Prior to Admission medications   Medication Sig Start Date End Date Taking? Authorizing Provider  amLODipine (NORVASC) 10 MG tablet Take 10 mg by mouth daily.   Yes [provider]  atorvastatin (LIPITOR) 40 MG tablet TAKE 1 TABLET ONCE DAILY AT 6 PM. 05/02/16  Yes Belva Crome, MD  cholecalciferol (VITAMIN D) 1000 UNITS tablet Take 1,000 Units by mouth daily.   Yes [provider]  donepezil (ARICEPT) 10 MG tablet TAKE ONE TABLET AT BEDTIME. 02/18/18  Yes Kathrynn Ducking, MD  ELIQUIS 5 MG TABS tablet TAKE 1 TABLET BY MOUTH TWICE DAILY. 02/18/18  Yes Belva Crome, MD  isosorbide mononitrate (IMDUR) 30 MG 24 hr tablet Take 1 tablet (30 mg total) by mouth daily. 01/20/18  Yes Belva Crome, MD  metoprolol tartrate (LOPRESSOR) 25 MG tablet TAKE 1 TABLET BY MOUTH TWICE DAILY. 01/20/18  Yes Belva Crome, MD  Multiple Vitamins-Minerals (CENTRUM SILVER PO) Take 1 tablet by mouth daily.    Yes [provider]  Multiple Vitamins-Minerals (PRESERVISION AREDS 2) CAPS Take 1 capsule by mouth 2 (two) times daily.    Yes [provider]  nitroGLYCERIN (NITROSTAT) 0.4 MG SL tablet Place 1 tablet (0.4 mg total) under the tongue every 5 (five) minutes as needed for chest pain. 12/22/17  Yes Imogene Burn, PA-C  omeprazole (PRILOSEC) 20 MG capsule Take 20 mg by mouth daily before breakfast.    Yes [provider]  isosorbide mononitrate (IMDUR) 30 MG 24  hr tablet Take 1 tablet (30 mg total) by mouth daily. Patient not taking: Reported on 02/19/2018 02/10/17   Belva Crome, MD    Physical Exam: Vitals:   02/19/18 2155 02/19/18 2200 02/19/18 2213 02/19/18 2245  BP:  (!) 116/56  (!) 115/59  Pulse:  79  83  Resp:  (!) 24  (!) 21  Temp: 99.3 F (37.4 C)  (!) 101 F (38.3 C)   TempSrc: Tympanic  Rectal   SpO2:  93%  91%  Weight:      Height:          Constitutional: Moderately built and nourished. Vitals:   02/19/18 2155 02/19/18 2200 02/19/18 2213 02/19/18 2245  BP:  (!) 116/56  (!) 115/59  Pulse:  79  83  Resp:  (!) 24  (!) 21  Temp: 99.3 F (37.4 C)  (!) 101 F (38.3 C)   TempSrc: Tympanic  Rectal   SpO2:  93%  91%  Weight:      Height:       Eyes: Anicteric no pallor. ENMT: No discharge from the ears eyes nose or mouth. Neck: No mass felt.  No neck rigidity.  No JVD appreciated. Respiratory: No rhonchi or crepitations. Cardiovascular: S1-S2 heard no murmurs appreciated. Abdomen: Soft nontender bowel sounds. Musculoskeletal: No edema.  No joint effusion. Skin: No rash.  Skin appears warm. Neurologic: Alert awake oriented to name.  Moves all extremities. Psychiatric: Oriented to his name only.   Labs on Admission: I have personally reviewed following labs and imaging studies  CBC: Recent Labs  Lab 02/19/18 1811 02/19/18 1829  WBC  --  20.5*  NEUTROABS  --  17.9*  HGB 15.6 15.2  HCT 46.0 45.1  MCV  --  86.7  PLT  --  127   Basic Metabolic Panel: Recent Labs  Lab 02/19/18 1811 02/19/18 1829  NA 136 137  K 4.1 4.6  CL 100* 102  CO2  --  25  GLUCOSE 129* 136*  BUN 18 16  CREATININE 1.20 1.43*  CALCIUM  --  9.6   GFR: Estimated Creatinine Clearance: 38.8 mL/min (A) (by C-G formula based on SCr of 1.43 mg/dL (H)). Liver Function Tests: Recent Labs  Lab 02/19/18 1829  AST 32  ALT 22  ALKPHOS 87  BILITOT 1.1  PROT 7.2  ALBUMIN 3.9   No results for input(s): LIPASE, AMYLASE in the last 168  hours. No results for input(s): AMMONIA in the last 168 hours. Coagulation Profile: Recent Labs  Lab 02/19/18 1829  INR 1.25   Cardiac Enzymes: No results for input(s): CKTOTAL, CKMB, CKMBINDEX, TROPONINI in the last 168 hours. BNP (last 3 results) No results for input(s): PROBNP in the last 8760 hours. HbA1C: No results for input(s): HGBA1C in the last 72 hours. CBG: Recent Labs  Lab 02/19/18 1754  GLUCAP 137*   Lipid Profile: No results for input(s): CHOL, HDL, LDLCALC, TRIG, CHOLHDL, LDLDIRECT in the last 72 hours. Thyroid Function Tests: Recent Labs    02/19/18 2013  TSH 0.565   Anemia Panel: Recent Labs    02/19/18 2013  VITAMINB12 659   Urine analysis:    Component Value Date/Time   COLORURINE YELLOW 02/19/2018 2153   APPEARANCEUR CLEAR 02/19/2018 2153   LABSPEC 1.044 (H) 02/19/2018 2153   PHURINE 5.0 02/19/2018 2153   GLUCOSEU NEGATIVE 02/19/2018 2153   HGBUR NEGATIVE 02/19/2018 2153   [place] NEGATIVE 02/19/2018 2153   [place] NEGATIVE 02/19/2018 2153   PROTEINUR 30 (A) 02/19/2018 2153   UROBILINOGEN 0.2 12/11/2014 1450   NITRITE NEGATIVE 02/19/2018 2153   LEUKOCYTESUR NEGATIVE 02/19/2018 2153   Sepsis Labs: @LABRCNTIP (procalcitonin:4,lacticidven:4) )No results found for this or any previous visit (from the past 240 hour(s)).   Radiological Exams on Admission: Ct Angio Head W Or Wo Contrast  Result Date: 02/19/2018 CLINICAL DATA:  Initial evaluation for acute stroke. EXAM: CT ANGIOGRAPHY HEAD AND NECK CT PERFUSION BRAIN TECHNIQUE: Multidetector CT imaging of the head and neck was performed using the standard protocol during bolus administration of intravenous contrast. Multiplanar CT image reconstructions and MIPs were obtained to evaluate the vascular anatomy. Carotid stenosis measurements (when applicable) are obtained utilizing NASCET criteria, using the distal internal carotid diameter as the denominator. Multiphase CT imaging of the brain  was performed following IV bolus contrast injection. Subsequent parametric perfusion maps were calculated using RAPID software. CONTRAST:  143mL ISOVUE-370 IOPAMIDOL (ISOVUE-370) INJECTION 76%; 57mL ISOVUE-370 IOPAMIDOL (ISOVUE-370) INJECTION 76% COMPARISON:  Prior head CT from earlier the same day. FINDINGS: CTA NECK FINDINGS Aortic arch: Aortic arch of normal caliber with normal branch pattern. Moderate atheromatous plaque about the arch itself. No flow-limiting stenosis about the origin of the great vessels. Subclavian arteries patent. Right carotid system: Mixed plaque about the right bifurcation/proximal right ICA without hemodynamically significant stenosis. Right common and internal carotid arteries otherwise widely patent. Left carotid system: Left common carotid artery widely patent to the bifurcation. Eccentric calcified plaque at the proximal left ICA without significant stenosis. Left ICA widely patent distally to the skull base. Vertebral arteries: Both of the vertebral arteries arise from the subclavian arteries. Atheromatous plaque at the origin of the vertebral arteries with approximate 50% stenosis bilaterally. Vertebral arteries otherwise patent within the neck without stenosis, dissection, or occlusion. Skeleton: Compression deformity involving the T5 vertebral body with up to 50% height loss, acute to subacute in appearance. Congenital fusion of C5 and C6 noted. No discrete osseous lesions. Prominent dental carie noted at the right maxilla. Other neck: Negative. Upper chest: Shotty subcentimeter lymph nodes noted within the upper mediastinum 4 mm right upper lobe nodule (series 5, image 178), indeterminate. Review of the MIP images confirms the above findings CTA HEAD FINDINGS Anterior circulation: Scattered atheromatous plaque throughout the petrous, cavernous, and supraclinoid segments with resultant approximate 50% moderate stenosis at the para clinoid ICAs bilaterally. ICA termini patent. A1  segments and anterior communicating artery patent. Atheromatous irregularity throughout the ACAs without flow-limiting stenosis, which are patent to their distal aspects. M1 segments irregular but patent bilaterally without high-grade stenosis. Right M1 bifurcates early. No proximal M2 occlusion. Distal MCA branches well perfused and symmetric but demonstrate atheromatous irregularity. Posterior circulation: Left V4 segment as it crosses into the cranial vault. Vertebral arteries otherwise widely patent. Posterior inferior cerebral arteries patent bilaterally. Basilar artery demonstrates multifocal atheromatous irregularity but is patent to its distal aspect without high-grade stenosis. SCA is patent bilaterally, with severe stenosis at the proximal left SCA. PCA supplied via the basilar and are well perfused to their distal aspects without high-grade stenosis. Small vessel atheromatous irregularity within the distal PCAs bilaterally. Venous sinuses: Grossly patent, although not well assessed due to arterial timing of the contrast bolus. Anatomic variants: None significant. Delayed phase: Not performed. Review of the MIP images confirms the above findings CT Brain Perfusion Findings: CBF (<30%) Volume: 69mL Perfusion (Tmax>6.0s) volume: 25mL Mismatch Volume: 5mL Infarction Location:Negative. IMPRESSION: 1. Negative CTA for large vessel occlusion. No evidence for acute infarct by CT perfusion. 2. Atherosclerotic change throughout the carotid siphons with associated moderate bilateral ICA stenoses. 3. Approximate 50% short-segment stenoses at the origin of the vertebral arteries bilaterally. 4. 50% stenosis at the origin of the right common carotid artery. 5. T1 compression fracture, acute to subacute in appearance. Correlation with history and physical exam for possible pain at this location. 6. 4 mm right upper lobe nodule, indeterminate. No follow-up needed if patient is low-risk. Non-contrast chest CT can be  considered in 12 months if patient is high-risk. This recommendation follows the consensus statement: Guidelines for Management of Incidental Pulmonary Nodules Detected on CT Images: From the Fleischner Society 2017; Radiology 2017; 284:228-243. Electronically Signed   By: Jeannine Boga M.D.   On: 02/19/2018 19:03   Ct Angio Neck W Or Wo Contrast  Result Date: 02/19/2018 CLINICAL DATA:  Initial evaluation for acute stroke. EXAM: CT ANGIOGRAPHY HEAD AND NECK CT PERFUSION BRAIN TECHNIQUE: Multidetector CT imaging of the head and neck was performed using the standard protocol during bolus administration of intravenous contrast. Multiplanar CT image reconstructions and MIPs were obtained to evaluate the vascular anatomy. Carotid stenosis measurements (when applicable) are obtained utilizing NASCET criteria, using the distal internal carotid diameter as the denominator. Multiphase CT imaging of the brain was performed following IV bolus contrast injection. Subsequent parametric perfusion maps were calculated using RAPID software. CONTRAST:  146mL ISOVUE-370 IOPAMIDOL (ISOVUE-370) INJECTION 76%; 82mL ISOVUE-370 IOPAMIDOL (ISOVUE-370) INJECTION 76% COMPARISON:  Prior head CT from earlier the same day. FINDINGS: CTA NECK FINDINGS Aortic arch: Aortic arch of normal caliber with normal branch pattern. Moderate atheromatous plaque about the arch itself. No flow-limiting stenosis about the origin of the great vessels. Subclavian arteries patent. Right carotid system: Mixed plaque about the right bifurcation/proximal right ICA without hemodynamically significant stenosis. Right common and internal carotid arteries otherwise widely patent. Left carotid system: Left common carotid artery widely patent to the bifurcation. Eccentric calcified plaque at the proximal left ICA without significant stenosis. Left ICA widely patent distally to the skull base. Vertebral arteries: Both of the vertebral arteries arise from the  subclavian arteries. Atheromatous plaque at the origin of the vertebral arteries with approximate 50% stenosis bilaterally. Vertebral arteries otherwise patent within the neck without stenosis, dissection, or occlusion. Skeleton: Compression deformity involving the T5 vertebral body with up to 50% height loss, acute to subacute in appearance. Congenital fusion of C5 and C6 noted. No discrete osseous lesions. Prominent dental carie noted at the right maxilla. Other neck: Negative. Upper chest: Shotty subcentimeter lymph nodes noted within the upper mediastinum 4 mm right upper lobe nodule (series 5, image 178), indeterminate. Review of the MIP images confirms the above findings CTA HEAD FINDINGS Anterior circulation: Scattered atheromatous plaque throughout the petrous, cavernous, and supraclinoid segments with resultant approximate 50% moderate stenosis at the para clinoid ICAs bilaterally. ICA termini patent. A1 segments and anterior communicating artery patent. Atheromatous irregularity throughout the ACAs without flow-limiting stenosis, which are patent to their distal aspects. M1 segments irregular but patent bilaterally without high-grade stenosis. Right M1 bifurcates early. No proximal M2 occlusion. Distal MCA branches well perfused and symmetric but demonstrate atheromatous irregularity. Posterior circulation: Left V4 segment as it crosses into the cranial vault. Vertebral arteries otherwise widely patent. Posterior inferior cerebral arteries patent bilaterally. Basilar artery demonstrates multifocal atheromatous irregularity but is patent to its distal aspect without high-grade stenosis. SCA is patent bilaterally, with severe stenosis at the proximal left SCA. PCA supplied via the basilar and are well perfused to their distal aspects without high-grade stenosis. Small vessel atheromatous irregularity within the distal PCAs bilaterally. Venous sinuses: Grossly patent, although not well assessed due to  arterial timing of the contrast bolus. Anatomic variants: None significant. Delayed phase: Not performed. Review of the MIP images confirms the above findings CT Brain Perfusion Findings: CBF (<30%) Volume: 63mL Perfusion (Tmax>6.0s) volume: 27mL Mismatch Volume: 30mL Infarction Location:Negative. IMPRESSION: 1. Negative CTA for large vessel occlusion. No evidence for acute infarct by CT perfusion. 2. Atherosclerotic change throughout the carotid siphons with associated moderate bilateral ICA stenoses. 3. Approximate 50% short-segment stenoses at the origin of the vertebral arteries bilaterally. 4. 50% stenosis at the origin of the right common carotid artery. 5. T1 compression fracture, acute to subacute in appearance. Correlation with history and physical exam for possible pain  at this location. 6. 4 mm right upper lobe nodule, indeterminate. No follow-up needed if patient is low-risk. Non-contrast chest CT can be considered in 12 months if patient is high-risk. This recommendation follows the consensus statement: Guidelines for Management of Incidental Pulmonary Nodules Detected on CT Images: From the Fleischner Society 2017; Radiology 2017; 284:228-243. Electronically Signed   By: Jeannine Boga M.D.   On: 02/19/2018 19:03   Ct Cerebral Perfusion W Contrast  Result Date: 02/19/2018 CLINICAL DATA:  Initial evaluation for acute stroke. EXAM: CT ANGIOGRAPHY HEAD AND NECK CT PERFUSION BRAIN TECHNIQUE: Multidetector CT imaging of the head and neck was performed using the standard protocol during bolus administration of intravenous contrast. Multiplanar CT image reconstructions and MIPs were obtained to evaluate the vascular anatomy. Carotid stenosis measurements (when applicable) are obtained utilizing NASCET criteria, using the distal internal carotid diameter as the denominator. Multiphase CT imaging of the brain was performed following IV bolus contrast injection. Subsequent parametric perfusion maps were  calculated using RAPID software. CONTRAST:  143mL ISOVUE-370 IOPAMIDOL (ISOVUE-370) INJECTION 76%; 54mL ISOVUE-370 IOPAMIDOL (ISOVUE-370) INJECTION 76% COMPARISON:  Prior head CT from earlier the same day. FINDINGS: CTA NECK FINDINGS Aortic arch: Aortic arch of normal caliber with normal branch pattern. Moderate atheromatous plaque about the arch itself. No flow-limiting stenosis about the origin of the great vessels. Subclavian arteries patent. Right carotid system: Mixed plaque about the right bifurcation/proximal right ICA without hemodynamically significant stenosis. Right common and internal carotid arteries otherwise widely patent. Left carotid system: Left common carotid artery widely patent to the bifurcation. Eccentric calcified plaque at the proximal left ICA without significant stenosis. Left ICA widely patent distally to the skull base. Vertebral arteries: Both of the vertebral arteries arise from the subclavian arteries. Atheromatous plaque at the origin of the vertebral arteries with approximate 50% stenosis bilaterally. Vertebral arteries otherwise patent within the neck without stenosis, dissection, or occlusion. Skeleton: Compression deformity involving the T5 vertebral body with up to 50% height loss, acute to subacute in appearance. Congenital fusion of C5 and C6 noted. No discrete osseous lesions. Prominent dental carie noted at the right maxilla. Other neck: Negative. Upper chest: Shotty subcentimeter lymph nodes noted within the upper mediastinum 4 mm right upper lobe nodule (series 5, image 178), indeterminate. Review of the MIP images confirms the above findings CTA HEAD FINDINGS Anterior circulation: Scattered atheromatous plaque throughout the petrous, cavernous, and supraclinoid segments with resultant approximate 50% moderate stenosis at the para clinoid ICAs bilaterally. ICA termini patent. A1 segments and anterior communicating artery patent. Atheromatous irregularity throughout the  ACAs without flow-limiting stenosis, which are patent to their distal aspects. M1 segments irregular but patent bilaterally without high-grade stenosis. Right M1 bifurcates early. No proximal M2 occlusion. Distal MCA branches well perfused and symmetric but demonstrate atheromatous irregularity. Posterior circulation: Left V4 segment as it crosses into the cranial vault. Vertebral arteries otherwise widely patent. Posterior inferior cerebral arteries patent bilaterally. Basilar artery demonstrates multifocal atheromatous irregularity but is patent to its distal aspect without high-grade stenosis. SCA is patent bilaterally, with severe stenosis at the proximal left SCA. PCA supplied via the basilar and are well perfused to their distal aspects without high-grade stenosis. Small vessel atheromatous irregularity within the distal PCAs bilaterally. Venous sinuses: Grossly patent, although not well assessed due to arterial timing of the contrast bolus. Anatomic variants: None significant. Delayed phase: Not performed. Review of the MIP images confirms the above findings CT Brain Perfusion Findings: CBF (<30%) Volume: 55mL Perfusion (Tmax>6.0s) volume:  31mL Mismatch Volume: 30mL Infarction Location:Negative. IMPRESSION: 1. Negative CTA for large vessel occlusion. No evidence for acute infarct by CT perfusion. 2. Atherosclerotic change throughout the carotid siphons with associated moderate bilateral ICA stenoses. 3. Approximate 50% short-segment stenoses at the origin of the vertebral arteries bilaterally. 4. 50% stenosis at the origin of the right common carotid artery. 5. T1 compression fracture, acute to subacute in appearance. Correlation with history and physical exam for possible pain at this location. 6. 4 mm right upper lobe nodule, indeterminate. No follow-up needed if patient is low-risk. Non-contrast chest CT can be considered in 12 months if patient is high-risk. This recommendation follows the consensus  statement: Guidelines for Management of Incidental Pulmonary Nodules Detected on CT Images: From the Fleischner Society 2017; Radiology 2017; 284:228-243. Electronically Signed   By: Jeannine Boga M.D.   On: 02/19/2018 19:03   Dg Chest Portable 1 View  Result Date: 02/19/2018 CLINICAL DATA:  Fever. EXAM: PORTABLE CHEST 1 VIEW COMPARISON:  12/22/2017 FINDINGS: Reverse apical lordotic positioning with the chin overlying the upper lungs. Midline trachea. Mild cardiomegaly. Atherosclerosis in the transverse aorta. No pleural effusion or pneumothorax. Clear lungs. IMPRESSION: No acute cardiopulmonary disease. Aortic Atherosclerosis (ICD10-I70.0). Electronically Signed   By: Abigail Miyamoto M.D.   On: 02/19/2018 19:24   Ct Head Code Stroke Wo Contrast  Result Date: 02/19/2018 CLINICAL DATA:  Code stroke. Altered level of consciousness. Right-sided weakness, aphasia EXAM: CT HEAD WITHOUT CONTRAST TECHNIQUE: Contiguous axial images were obtained from the base of the skull through the vertex without intravenous contrast. COMPARISON:  CT head 03/17/2012 FINDINGS: Brain: Moderate atrophy. Extensive chronic microvascular ischemic change throughout the white matter. Negative for acute infarct.  Negative for acute hemorrhage or mass. Vascular: Negative for hyperdense vessel Skull: Negative Sinuses/Orbits: Mild mucosal edema paranasal sinuses. Bilateral cataract surgery Other: None ASPECTS ([place] Stroke Program Early CT Score) - Ganglionic level infarction (caudate, lentiform nuclei, internal capsule, insula, M1-M3 cortex): 7 - Supraganglionic infarction (M4-M6 cortex): 3 Total score (0-10 with 10 being normal): 10 IMPRESSION: 1. No acute intracranial abnormality. 2. ASPECTS is 10 3. Atrophy and extensive chronic microvascular ischemia. 4. These results were called by telephone at the time of interpretation on 02/19/2018 at 6:15 pm to Dr. Rory Percy , who verbally acknowledged these results. Electronically Signed   By:  Franchot Gallo M.D.   On: 02/19/2018 18:16    EKG: Independently reviewed.  Normal sinus rhythm.  Assessment/Plan Principal Problem:   Sepsis ([place]) Active Problems:   History of CVA (cerebrovascular accident)   Dementia   Obstructive sleep apnea (adult) (pediatric)   Atrial fibrillation ([place])   Seizures ([place])    1. Seizures -appreciate neurology consult.  Patient placed on Keppra.  Dosed as IV since patient failed swallow.  Check MRI brain and EEG. 2. Possible sepsis -source not clear.  Empiric antibiotics follow cultures lactate continue hydration. 3. History of paroxysmal atrial fibrillation -since patient failed swallow patient is placed on Lovenox until swallow evaluation.  Patient is on IV metoprolol. 4. Hypertension on scheduled IV metoprolol and PRN IV hydralazine. 5. CAD status post stenting -cycle cardiac markers. 6. History of dementia on Aricept. 7. Sleep apnea noncompliant with CPAP. 8. History of chronic kidney disease stage II. 9. T1 compression fracture -patient does not indicate any pain at this time.  Note that patient has failed swallow and most of his medications are given IV.  Once patient passes swallow may change to oral home medications.   DVT prophylaxis:  Lovenox full dose. Code Status: DNR. Family Communication: Patient's daughter. Disposition Plan: Home. Consults called: Neurology. Admission status: Inpatient.   Rise Patience MD [facility] Pager [phone].  If 7PM-7AM, please contact night-coverage [email or web address] Password [facility]  02/19/2018, 11:49 PM

## 2018-02-19 NOTE — Progress Notes (Signed)
Derek [name] is a 84 yo male presenting to ED with acute onset Right side weakness and aphasia. Pt LKW 1300, found by family approx 1730 sitting outside in a winter coat. No seizure activity noted by EMS he was incontinent of urine. In CT he had a 30-40 second tonic clonic seizure. No hx of seizures. Keppra 1g IV given, Ativan 1mg  IVP. Code stroke cancelled.  NIH 10 CBG 137

## 2018-02-19 NOTE — ED Notes (Signed)
Pts speech is garbled  He cannot answer anything but yes and  To questions asked

## 2018-02-19 NOTE — ED Provider Notes (Addendum)
I saw and evaluated the patient, reviewed the resident's note and I agree with the findings and plan.  EKG: None  Patient here with altered mental status and possible neurological deficit just prior to arrival.  Patient has baseline history of dementia. patient had a seizure while in department.  Patient loaded with Keppra here.  Possible infectious process.  Patient will be admitted pending labs  Pt seen by neurology and LP felt not to be needed Admitted to step down  CRITICAL CARE Performed by: Leota Jacobsen Total critical care time: 50 minutes Critical care time was exclusive of separately billable procedures and treating other patients. Critical care was necessary to treat or prevent imminent or life-threatening deterioration. Critical care was time spent personally by me on the following activities: development of treatment plan with patient and/or surrogate as well as nursing, discussions with consultants, evaluation of patient's response to treatment, examination of patient, obtaining history from patient or surrogate, ordering and performing treatments and interventions, ordering and review of laboratory studies, ordering and review of radiographic studies, pulse oximetry and re-evaluation of patient's condition.    Lacretia Leigh, MD 02/19/18 Yevette Edwards    Lacretia Leigh, MD 03/03/18 [phone]

## 2018-02-19 NOTE — ED Notes (Signed)
Will give his name

## 2018-02-19 NOTE — ED Notes (Signed)
Report called to cindy rn on 3w

## 2018-02-19 NOTE — ED Notes (Signed)
Neurologists at the bedside

## 2018-02-19 NOTE — ED Triage Notes (Signed)
The pt had been sitting outside for many hours  He went into the house 1330 to get his winter coat.  He was c/o being cold.  He has difficult with his speech  He had a seaixure shortlt after arriving in the c-t scanner  approx 1800  He was given keppra started at [facility] activity was gneralized both legs and arms with loc that lasted approx 45 secs.

## 2018-02-19 NOTE — ED Notes (Signed)
The pt was incontinent of urine on arrival

## 2018-02-19 NOTE — Consult Note (Signed)
Neurology Consultation  Reason for Consult: CODE STROKE Referring Physician: Dr. Zenia Resides  CC: Aphasia, RSW  History is obtained from:  HPI: Derek [name] is a 84 y.o. male PMH of Afib on anticoagulation, dementia, OSA, Stroke in past with unknown deficits, CAD, brought in by EMS for RSW as well as aphasia. He was in his usual state of health with last known normal at 1 PM on 02/19/2018.  He lives at home with son.  He usually sits on his porch outside.  He was on his porch outside, when the family noted that he was unresponsive.  There was no seizure activity noted.  When EMS got there, he open his eyes, was not able to follow any commands and was weak on the right side.  He was outside the window for IV TPA but they initiated a LVO positive code stroke and he was brought into the ER. Upon initial assessment in the ER, he was awake alert, following some simple simple commands but not following complex commands.  He kept perseverating his name as an answer for all questions. He had obvious urinary incontinence with staining of urine on his trousers. No obvious evidence of blood in his mouth. He was taken for a stat noncontrast CT of the head where he had what was reported to be a generalized tonic-clonic seizure that lasted about 3540-second and resolved on its own without Ativan or benzodiazepines. I gave him a load of Keppra 1 g IV in the CT scanner to complete the CT and the CT Angie perfusion studies    LKW: 1300 on 02/19/2018 tpa given?: no, likely seizure Premorbid modified Rankin scale (mRS): Difficult to obtain from the patient.  Last neurology notes is MMSE 20.  Has had old strokes.  Does not operate vehicles.  Best case MRS=2  ROS: unable to obtain due to altered mental status.   Past Medical History:  Diagnosis Date  . Broken shoulder   . Cerebrovascular disease    MRI/MRA in 2007 - 50% stenosis of supraclinoid ICA, left  . Clavicle fracture   . Complex sleep apnea syndrome     adapt SV titration EEP 6 min 6 and max 15 cm water.   . Compression fracture   . Coronary atherosclerosis of native coronary artery 07/12/2013   a. NSTEMI in setting of AF with RVR 11/14 >> BMS to CFX;  b. Nuclear (9/15):  High risk - apical lateral ischemia >> c. LHC (9/15):  pLAD 50%, D1 (smaller br 90%/larger br 80%, pDx 50-60%, dLAD 50%, prox-mid CFX stent ok, pOM1 and OM2 50-70% (jailed by stent), OM5 70-80%, mPDA 90% (CFX dsz unchanged from 2014), RCA diff dsz, EF 55%, mild ant-apical HK >>  Med Rx  . CVA (cerebral vascular accident) ([place])   . Dementia   . History of pneumonia   . Hx of echocardiogram    a. Echo 11/14): EF 55-60%, no RWMA, grade 2 DD, mild aortic stenosis (mean 15 mmHg), mild LAE  . Hypertension   . MI (myocardial infarction) ([place]) 07/2013  . PAF (paroxysmal atrial fibrillation) ([facility])    hx of NSTEMI in setting of AF with RVR 07/2013;  Eliquis for AC  . Shingles    left chest  . Syncope   . TIA (transient ischemic attack) 07/12/2013   Family History  Problem Relation Age of Onset  . Diabetes Mother   . Diabetes Father   . Cancer - Lung Brother   . Gait disorder Brother   .  Cancer Daughter   . Stroke Sister   . Heart attack Neg Hx    Social History:   reports that he has never smoked. He has never used smokeless tobacco. He reports that he drinks about 0.6 oz of alcohol per week. He reports that he does not use drugs.   Medications  Current Facility-Administered Medications:  .  levETIRAcetam (KEPPRA) IVPB 1000 mg/100 mL premix, 1,000 mg, Intravenous, Once, Amie Portland, MD  Current Outpatient Medications:  .  amLODipine (NORVASC) 10 MG tablet, Take 10 mg by mouth daily., Disp: , Rfl:  .  atorvastatin (LIPITOR) 40 MG tablet, TAKE 1 TABLET ONCE DAILY AT 6 PM., Disp: 90 tablet, Rfl: 0 .  cholecalciferol (VITAMIN D) 1000 UNITS tablet, Take 1,000 Units by mouth daily., Disp: , Rfl:  .  donepezil (ARICEPT) 10 MG tablet, TAKE ONE TABLET AT BEDTIME., Disp: 30  tablet, Rfl: 5 .  ELIQUIS 5 MG TABS tablet, TAKE 1 TABLET BY MOUTH TWICE DAILY., Disp: 60 tablet, Rfl: 10 .  isosorbide mononitrate (IMDUR) 30 MG 24 hr tablet, Take 1 tablet (30 mg total) by mouth daily., Disp: 30 tablet, Rfl: 11 .  isosorbide mononitrate (IMDUR) 30 MG 24 hr tablet, Take 1 tablet (30 mg total) by mouth daily., Disp: 30 tablet, Rfl: 11 .  metoprolol tartrate (LOPRESSOR) 25 MG tablet, TAKE 1 TABLET BY MOUTH TWICE DAILY., Disp: 60 tablet, Rfl: 11 .  Multiple Vitamins-Minerals (CENTRUM SILVER PO), Take 1 tablet by mouth daily. , Disp: , Rfl:  .  Multiple Vitamins-Minerals (PRESERVISION AREDS 2) CAPS, Take 1 capsule by mouth 2 (two) times daily. , Disp: , Rfl:  .  nitroGLYCERIN (NITROSTAT) 0.4 MG SL tablet, Place 1 tablet (0.4 mg total) under the tongue every 5 (five) minutes as needed for chest pain., Disp: 25 tablet, Rfl: 2 .  omeprazole (PRILOSEC) 20 MG capsule, Take 20 mg by mouth daily., Disp: , Rfl:   Exam: Current vital signs: There were no vitals taken for this visit. Vital signs in last 24 hours:   Systolic blood pressure 086V Breathing normally and saturating well at room air General:: Well-developed well-nourished in no apparent distress HEENT: Normocephalic atraumatic dry mucous membranes.  Neck is supple Lungs: Clear to auscultation Cardiovascular: S1-S2 heard, regular rate rhythm Abdomen: Soft nondistended nontender Extremities: Warm well perfused with 1+ pitting edema bilaterally Neurological exam  patient is awake, alert. He is able to say his name Upon asking his age, he still says his name. He was able to follow some simple commands-and mimic. Did not follow multistep commands. Speech was not dysarthric from whatever I could ascertain. Cranial nerves: Pupils equal round reactive light, no restriction of extraocular movements, seem to be responding less to threat from the right, normal response to threat in the visual field from the left, mild droop of the  right lower face, auditory acuity sounded intact to conversation, tongue midline and palate midline. Motor exam: Had poor attention concentration but was able to raise left upper and left lower extremities against gravity for 10 and 5 seconds respectively without drift.  Had some drift in the right upper extremity and the right lower extremity-according to EMS this was better than what they had seen but he was nearly plegic on initial assessment by EMS. Sensory: Appeared intact to light touch.  Also possibly neglecting right side. Initially did not follow commands to perform finger-nose-finger but later perform finger-nose-finger test without abnormality  NIHSS 1a Level of Conscious.:  0 1b LOC Questions:  2 1c LOC Commands: 1 2 Best Gaze: 0 3 Visual: 1 4 Facial Palsy: 1 5a Motor Arm - left: 0 5b Motor Arm - Right: 1 6a Motor Leg - Left: 0 6b Motor Leg - Right: 1 7 Limb Ataxia: 0 8 Sensory: 0 9 Best Language: 2 10 Dysarthria: 0 11 Extinct. and Inatten.: 1 TOTAL: 10  Labs I have reviewed labs in epic and the results pertinent to this consultation are: CBC    Component Value Date/Time   WBC 8.1 12/22/2017 1122   WBC 7.5 12/13/2014 0545   RBC 4.96 12/22/2017 1122   RBC 4.98 12/13/2014 0545   HGB 15.6 02/19/2018 1811   HGB 15.3 12/22/2017 1122   HCT 46.0 02/19/2018 1811   HCT 43.2 12/22/2017 1122   PLT 230 12/22/2017 1122   MCV 87 12/22/2017 1122   MCH 30.8 12/22/2017 1122   MCH 28.5 12/13/2014 0545   MCHC 35.4 12/22/2017 1122   MCHC 33.3 12/13/2014 0545   RDW 13.4 12/22/2017 1122   LYMPHSABS 2.2 12/22/2017 1122   MONOABS 0.8 12/11/2014 1132   EOSABS 0.5 (H) 12/22/2017 1122   BASOSABS 0.1 12/22/2017 1122   CMP     Component Value Date/Time   NA 136 02/19/2018 1811   NA 141 12/22/2017 1122   K 4.1 02/19/2018 1811   CL 100 (L) 02/19/2018 1811   CO2 25 12/22/2017 1122   GLUCOSE 129 (H) 02/19/2018 1811   BUN 18 02/19/2018 1811   BUN 18 12/22/2017 1122   CREATININE  1.20 02/19/2018 1811   CALCIUM 9.8 12/22/2017 1122   PROT 7.1 12/22/2017 1122   ALBUMIN 4.6 12/22/2017 1122   AST 19 12/22/2017 1122   ALT 18 12/22/2017 1122   ALKPHOS 106 12/22/2017 1122   BILITOT 0.6 12/22/2017 1122   GFRNONAA 48 (L) 12/22/2017 1122   GFRAA 56 (L) 12/22/2017 1122    Imaging I have reviewed the images obtained: CT-scan of the brain-no acute changes, chronic white matter disease, atrophy. CT  angiogram of the head and neck-no emergent LVO. CT perfusion study showed no perfusion deficits  Assessment:  84 year old Caucasian man with past medical history of atrial fibrillation on Eliquis, coronary artery disease, sleep apnea,, past strokes with unknown residual deficits brought in by EMS when he was found to be altered and confused, and on exam had findings consistent with right hemiparesis that was gradually improving as well as possibly some right-sided neglect. He had a witnessed short duration seizure that resolved spontaneously on the CT table for about 35 to 40 seconds. He started to improve again after that making suspicion for an underlying seizure more likely than a stroke. Also, he is outside the window for TPA hence not a TPA candidate. CT angios and perfusion studies are negative for any emergent large vessel occlusion, hence not a candidate for endovascular thrombectomy. Most likely provoked seizure in the setting of UTI in a patient with dementia. I considered the possibility of this being meningitis, but with a normal white count, a urine that smelled abnormal and urinalysis pending, rapidly returning back to baseline, I would not consider doing an LP just about yet.  And I do not think he needs meningitic coverage as well.  Impression: Altered mental status-likely secondary to postictal.  From a seizure History of dementia Atrial fibrillation on anticoagulation  Recommendations: -I think he would require admission for further work-up of his AMS. -Do not  see a need for LP -He will need an EEG and  an MRI of the brain preferably a brain MRI with and without contrast if renal function permits. -He was given Keppra 1 g IV x1 in the CT scanner.  I would continue Keppra 500 twice daily. -Sz precautions -Ativan PRN for seizure >39min -UA, TSH, B12 Further recs per clinical course and imaging studies Might consider stat EEG if no improvement.  -- Amie Portland, MD [facility] Pager: [phone] If 7pm to 7am, please call on call as listed on AMION.  CRITICAL CARE ATTESTATION This patient is critically ill and at significant risk of neurological worsening, death and care requires constant monitoring of vital signs, hemodynamics,respiratory and cardiac monitoring. I spent 45  minutes of neurocritical care time performing neurological assessment, discussion with family, other specialists and medical decision making of high complexityin the care of  this patient.

## 2018-02-19 NOTE — ED Notes (Signed)
Pt coughed IMMEDIATELY AFTER THE CRACKER AND WATER WAS GIVEN

## 2018-02-19 NOTE — Progress Notes (Signed)
Pharmacy Antibiotic Note  Derek [name] is a 84 y.o. male admitted on 02/19/2018 with sepsis.  Pharmacy has been consulted for vancomycin, zosyn, and azactam dosing.  Patient came to ED with via EMS presenting with right-sided weakness and aphasia concerning for a stroke. In the ED, patient began seizing. He was incontinent. WBC elevated, febrile, lactic acid pending. Concern for sepsis.   Patient has PCN allergy. Watch renal function.  Plan: Vancomycin 2000 mg IV x 1 in ED Vancomycin 1250 mg IV Q24 Levofloxacin 759 mg Q48h Azactam 2g IV Q8h Monitor clinical progression, BCx, LOT , renal function  Height: 5\' 9"  (175.3 cm) Weight: 190 lb (86.2 kg) IBW/kg (Calculated) : 70.7  Temp (24hrs), Avg:102.3 F (39.1 C), Min:102.3 F (39.1 C), Max:102.3 F (39.1 C)  Recent Labs  Lab 02/19/18 1811 02/19/18 1829  WBC  --  20.5*  CREATININE 1.20 1.43*    Estimated Creatinine Clearance: 38.8 mL/min (A) (by C-G formula based on SCr of 1.43 mg/dL (H)).    Allergies  Allergen Reactions  . Augmentin [Amoxicillin-Pot Clavulanate] Rash  . Penicillins Rash  . Sulfa Antibiotics Rash    Antimicrobials this admission: 6/14 Vancomycin >>  6/14 Azactam  >>  6/14 Levofloxacin>>  Dose adjustments this admission:   Microbiology results: 6/14 BCx:    Leroy Libman, PharmD Pharmacy Resident Pager: [phone]

## 2018-02-19 NOTE — ED Notes (Signed)
Elevated CG-4 of 2.21 reported to Dr. Berline Lopes

## 2018-02-19 NOTE — ED Notes (Signed)
WAITING ON BLOOD CULTURES TO BE DRAWN FOR THE START OF THE ANTIBIOTICS

## 2018-02-19 NOTE — ED Provider Notes (Signed)
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 common carotid artery. 5. T1 compression fracture, acute to subacute in appearance. Correlation with history and physical exam for possible pain at this location. 6. 4 mm right upper lobe nodule, indeterminate. No follow-up needed if patient is low-risk. Non-contrast chest CT can be considered in 12 months if patient is high-risk. This recommendation follows the consensus statement: Guidelines for Management of Incidental Pulmonary Nodules Detected on CT Images: From the Fleischner Society 2017; Radiology 2017; 284:228-243. Electronically Signed   By: Jeannine Boga M.D.   On: 02/19/2018 19:03   Dg Chest Portable 1 View  Result Date: 02/19/2018 CLINICAL DATA:  Fever. EXAM: PORTABLE CHEST 1 VIEW COMPARISON:  12/22/2017 FINDINGS: Reverse apical lordotic positioning with the chin overlying the upper lungs. Midline trachea. Mild cardiomegaly. Atherosclerosis in the transverse aorta. No pleural effusion or pneumothorax. Clear lungs. IMPRESSION: No acute cardiopulmonary disease. Aortic Atherosclerosis (ICD10-I70.0).  Electronically Signed   By: Abigail Miyamoto M.D.   On: 02/19/2018 19:24   Ct Head Code Stroke Wo Contrast  Result Date: 02/19/2018 CLINICAL DATA:  Code stroke. Altered level of consciousness. Right-sided weakness, aphasia EXAM: CT HEAD WITHOUT CONTRAST TECHNIQUE: Contiguous axial images were obtained from the base of the skull through the vertex without intravenous contrast. COMPARISON:  CT head 03/17/2012 FINDINGS: Brain: Moderate atrophy. Extensive chronic microvascular ischemic change throughout the white matter. Negative for acute infarct.  Negative for acute hemorrhage or mass. Vascular: Negative for hyperdense vessel Skull: Negative Sinuses/Orbits: Mild mucosal edema paranasal sinuses. Bilateral cataract surgery Other: None ASPECTS ([place] Stroke Program Early CT Score) - Ganglionic level infarction (caudate, lentiform nuclei, internal capsule, insula, M1-M3 cortex): 7 - Supraganglionic infarction (M4-M6 cortex): 3 Total score (0-10 with 10 being normal): 10 IMPRESSION: 1. No acute intracranial abnormality. 2. ASPECTS is 10 3. Atrophy and extensive chronic microvascular ischemia. 4. These results were called by telephone at the time of interpretation on 02/19/2018 at 6:15 pm to Dr. Rory Percy , who verbally acknowledged these results. Electronically Signed   By: Franchot Gallo M.D.  On: 02/19/2018 18:16    Procedures Procedures (including critical care time)  Medications Ordered in ED Medications  vancomycin (VANCOCIN) 2,000 mg in sodium chloride 0.9 % 500 mL IVPB (has no administration in time range)  vancomycin (VANCOCIN) 1,250 mg in sodium chloride 0.9 % 250 mL IVPB (has no administration in time range)  levofloxacin (LEVAQUIN) IVPB 750 mg (has no administration in time range)  aztreonam (AZACTAM) 2 g in sodium chloride 0.9 % 100 mL IVPB (has no administration in time range)   stroke: mapping our early stages of recovery book (has no administration in time range)  0.9 %  sodium chloride infusion (  Intravenous Transfusing/Transfer 02/20/18 0014)  acetaminophen (TYLENOL) tablet 650 mg (has no administration in time range)    Or  acetaminophen (TYLENOL) solution 650 mg (has no administration in time range)    Or  acetaminophen (TYLENOL) suppository 650 mg (has no administration in time range)  levETIRAcetam (KEPPRA) IVPB 500 mg/100 mL premix (has no administration in time range)  metoprolol tartrate (LOPRESSOR) injection 2.5 mg (has no administration in time range)  hydrALAZINE (APRESOLINE) injection 10 mg (has no administration in time range)  enoxaparin (LOVENOX) injection 90 mg (has no administration in time range)  levETIRAcetam (KEPPRA) IVPB 1000 mg/100 mL premix (0 mg Intravenous Stopped 02/19/18 1830)  iopamidol (ISOVUE-370) 76 % injection 100 mL (100 mLs Intravenous Contrast Given 02/19/18 1826)  iopamidol (ISOVUE-370) 76 % injection 50 mL (50 mLs Intravenous Contrast Given 02/19/18 1827)  acetaminophen (TYLENOL) suppository 650 mg (650 mg Rectal Given 02/19/18 2246)  levofloxacin (LEVAQUIN) IVPB 750 mg (750 mg Intravenous New Bag/Given 02/19/18 2237)  aztreonam (AZACTAM) 2 g in sodium chloride 0.9 % 100 mL IVPB (0 g Intravenous Stopped 02/19/18 2236)  sodium chloride 0.9 % bolus 1,000 mL (1,000 mLs Intravenous Transfusing/Transfer 02/19/18 2300)     Initial Impression / Assessment and Plan / ED Course  I have reviewed the triage vital signs and the nursing notes.  Pertinent labs & imaging results that were available during my care of the patient were reviewed by me and considered in my medical decision making (see chart for details).      XUE LOW is a 84 y.o. male with PMHx of dementia who p/w with AMS, possible seizure at home, febrile. Reviewed and confirmed nursing documentation for past medical history, family history, social history. VS temp 102.71F, BP wnl. Exam remarkable for no focal deficits, lungs CTAB, abd benign, no meningeal signs. Did have witnessed seizure in CT  scanner. Presentation today likely 2/2 seizure with urinary incontinence, possible RSW in post-ictal period. This resolved on arrival  To ED. Unclear etiology of fever- suspect possible viral illness as family and patient endorse no pulmonary or urinary complaints. Exam not c/w meningitis.   Came in as code stroke, CT head w/o and CTA head/neck and perfusion studies all negative for acute hemorrhage or LVO. Neurology evaluated patient, believe presentation likely related to sz. Iv ativan given for sz with resolution of activity. Loaded with IV keppra in department. Will plan on MRI and EEG as inpatient. Believe LP not indicated at this time.   CBC with leukocytosis 20.5 with neutrophil predominance c/w seizure and/or infection. CMP unremarkable. Trop neg. CXR neg. UA without evidence of infection. TSH wnl. EtOH negative. Urine culture, blood culture pending. Unclear etiology of fever, but treated empirically for sepsis with broad spectrum antibiotics.   Old records reviewed. Labs reviewed by me and used in the medical decision making.  Imaging viewed and interpreted by me and used in the medical decision making (formal interpretation from radiologist). EKG reviewed by me and used in the medical decision making. Admitted to hospitalist for further eval and mgmt.    Final Clinical Impressions(s) / ED Diagnoses   Final diagnoses:  Seizures ([place])  Fever, unspecified  Altered mental status, unspecified altered mental status type    ED Discharge Orders    None       Norm Salt, MD 02/20/18 [place]    Lacretia Leigh, MD 02/21/18 [phone]

## 2018-02-20 ENCOUNTER — Inpatient Hospital Stay (HOSPITAL_COMMUNITY): Payer: Medicare Other

## 2018-02-20 ENCOUNTER — Encounter (HOSPITAL_COMMUNITY): Payer: Self-pay | Admitting: Family Medicine

## 2018-02-20 DIAGNOSIS — F0280 Dementia in other diseases classified elsewhere without behavioral disturbance: Secondary | ICD-10-CM

## 2018-02-20 DIAGNOSIS — R509 Fever, unspecified: Secondary | ICD-10-CM

## 2018-02-20 DIAGNOSIS — R911 Solitary pulmonary nodule: Secondary | ICD-10-CM

## 2018-02-20 DIAGNOSIS — N1831 Chronic kidney disease, stage 3a: Secondary | ICD-10-CM

## 2018-02-20 DIAGNOSIS — G309 Alzheimer's disease, unspecified: Secondary | ICD-10-CM

## 2018-02-20 DIAGNOSIS — N183 Chronic kidney disease, stage 3 (moderate): Secondary | ICD-10-CM

## 2018-02-20 DIAGNOSIS — I480 Paroxysmal atrial fibrillation: Secondary | ICD-10-CM

## 2018-02-20 DIAGNOSIS — IMO0001 Reserved for inherently not codable concepts without codable children: Secondary | ICD-10-CM

## 2018-02-20 DIAGNOSIS — S22010A Wedge compression fracture of first thoracic vertebra, initial encounter for closed fracture: Secondary | ICD-10-CM

## 2018-02-20 HISTORY — DX: Wedge compression fracture of first thoracic vertebra, initial encounter for closed fracture: S22.010A

## 2018-02-20 HISTORY — DX: Solitary pulmonary nodule: R91.1

## 2018-02-20 HISTORY — DX: Reserved for inherently not codable concepts without codable children: IMO0001

## 2018-02-20 LAB — BASIC METABOLIC PANEL
Anion gap: 6 (ref 5–15)
BUN: 13 mg/dL (ref 6–20)
CHLORIDE: 111 mmol/L (ref 101–111)
CO2: 19 mmol/L — ABNORMAL LOW (ref 22–32)
CREATININE: 1.1 mg/dL (ref 0.61–1.24)
Calcium: 6.9 mg/dL — ABNORMAL LOW (ref 8.9–10.3)
GFR calc Af Amer: >60 mL/min (ref >60)
GFR calc non Af Amer: 58 mL/min — ABNORMAL LOW (ref >60)
Glucose, Bld: 84 mg/dL (ref 65–99)
Potassium: 3.4 mmol/L — ABNORMAL LOW (ref 3.5–5.1)
SODIUM: 136 mmol/L (ref 135–145)

## 2018-02-20 LAB — CBC
HCT: 32 % — ABNORMAL LOW (ref 39.0–52.0)
HEMOGLOBIN: 10.6 g/dL — AB (ref 13.0–17.0)
MCH: 29.3 pg (ref 26.0–34.0)
MCHC: 33.1 g/dL (ref 30.0–36.0)
MCV: 88.4 fL (ref 78.0–100.0)
PLATELETS: 132 10*3/uL — AB (ref 150–400)
RBC: 3.62 MIL/uL — ABNORMAL LOW (ref 4.22–5.81)
RDW: 13 % (ref 11.5–15.5)
WBC: 13.7 10*3/uL — ABNORMAL HIGH (ref 4.0–10.5)

## 2018-02-20 LAB — GLUCOSE, CAPILLARY
GLUCOSE-CAPILLARY: 99 mg/dL (ref 65–99)
GLUCOSE-CAPILLARY: 151 mg/dL — AB (ref 65–99)
Glucose-Capillary: 122 mg/dL — ABNORMAL HIGH (ref 65–99)
Glucose-Capillary: 133 mg/dL — ABNORMAL HIGH (ref 65–99)

## 2018-02-20 LAB — LACTIC ACID, PLASMA: LACTIC ACID, VENOUS: 1.1 mmol/L (ref 0.5–1.9)

## 2018-02-20 MED ORDER — ISOSORBIDE MONONITRATE ER 30 MG PO TB24
30.0000 mg | ORAL_TABLET | Freq: Every day | ORAL | Status: DC
  Administered 2018-02-20 – 2018-02-21 (×2): 30 mg via ORAL
  Filled 2018-02-20 (×2): qty 1

## 2018-02-20 MED ORDER — APIXABAN 5 MG PO TABS
5.0000 mg | ORAL_TABLET | Freq: Two times a day (BID) | ORAL | Status: DC
  Administered 2018-02-20 – 2018-02-21 (×2): 5 mg via ORAL
  Filled 2018-02-20 (×2): qty 1

## 2018-02-20 MED ORDER — NITROGLYCERIN 0.4 MG SL SUBL: 0.4000 mg | SUBLINGUAL_TABLET | SUBLINGUAL | Status: DC | PRN

## 2018-02-20 MED ORDER — GADOBENATE DIMEGLUMINE 529 MG/ML IV SOLN
10.0000 mL | Freq: Once | INTRAVENOUS | Status: AC
  Administered 2018-02-20: 10 mL via INTRAVENOUS

## 2018-02-20 MED ORDER — PANTOPRAZOLE SODIUM 40 MG PO TBEC
40.0000 mg | DELAYED_RELEASE_TABLET | Freq: Every day | ORAL | Status: DC
  Administered 2018-02-20 – 2018-02-21 (×2): 40 mg via ORAL
  Filled 2018-02-20 (×2): qty 1

## 2018-02-20 MED ORDER — DONEPEZIL HCL 10 MG PO TABS
10.0000 mg | ORAL_TABLET | Freq: Every day | ORAL | Status: DC
  Administered 2018-02-20: 10 mg via ORAL
  Filled 2018-02-20: qty 1

## 2018-02-20 MED ORDER — ENOXAPARIN SODIUM 100 MG/ML ~~LOC~~ SOLN
90.0000 mg | Freq: Two times a day (BID) | SUBCUTANEOUS | Status: DC
  Administered 2018-02-20 (×2): 90 mg via SUBCUTANEOUS
  Filled 2018-02-20 (×2): qty 0.9

## 2018-02-20 MED ORDER — METOPROLOL TARTRATE 25 MG PO TABS
25.0000 mg | ORAL_TABLET | Freq: Two times a day (BID) | ORAL | Status: DC
  Administered 2018-02-20 – 2018-02-21 (×2): 25 mg via ORAL
  Filled 2018-02-20 (×2): qty 1

## 2018-02-20 NOTE — Evaluation (Signed)
Occupational Therapy Evaluation Patient Details Name: Derek [name] MRN: [id number] DOB: 1927/07/18 Today's Date: 02/20/2018    History of Present Illness 84 y.o. male admitted on 02/19/18 for AMS and confusion with possible R sided weakness.  Neurology more suspicious of post ictal seizure state, however, MRI and EEG are still pending.  Pt wiht significant PMH of TIA, syncope, PAF, MI, HTN, dementia, compression fx, clavicle fx, L shoulder surgery, and kyphoplasty.     Clinical Impression   Pt admitted with above. He demonstrates the below listed deficits and will benefit from continued OT to maximize safety and independence with BADLs.  Pt presents to OT with impaired balance, generalized weakness, decreased activity tolerance, and impaired cognition (baseline).  He currently requires mod A for LB ADLs and functional mobility.   He lives with son and was independent PTA.  His bedroom is on second floor of their home, and pt stays up late into the night time hours watching TV, and will go up and down the stairs at night while son is asleep.   Feel he would benefit from post acute rehab to reduce risk of falls, injury, and readmission.       Follow Up Recommendations  CIR;Supervision/Assistance - 24 hour    Equipment Recommendations  None recommended by OT    Recommendations for Other Services Rehab consult     Precautions / Restrictions Precautions Precautions: Fall Restrictions Weight Bearing Restrictions: No      Mobility Bed Mobility Overal bed mobility: Needs Assistance Bed Mobility: Supine to Sit     Supine to sit: Min assist     General bed mobility comments: Pt sitting up in chair   Transfers Overall transfer level: Needs assistance Equipment used: 1 person hand held assist Transfers: Sit to/from Stand Sit to Stand: Min assist         General transfer comment: assist to steady     Balance Overall balance assessment: Needs assistance Sitting-balance  support: Feet supported;No upper extremity supported;Bilateral upper extremity supported;Single extremity supported Sitting balance-Leahy Scale: Fair     Standing balance support: Single extremity supported;During functional activity Standing balance-Leahy Scale: Poor Standing balance comment: requires UE support and min A for static standing                            ADL either performed or assessed with clinical judgement   ADL Overall ADL's : Needs assistance/impaired Eating/Feeding: Independent   Grooming: Wash/dry hands;Wash/dry face;Oral care;Brushing hair;Minimal assistance;Standing   Upper Body Bathing: Supervision/ safety;Set up;Sitting   Lower Body Bathing: Moderate assistance;Sit to/from stand   Upper Body Dressing : Set up;Supervision/safety;Sitting   Lower Body Dressing: Moderate assistance;Sit to/from stand   Toilet Transfer: Moderate assistance;Ambulation;Comfort height toilet;RW   Toileting- Clothing Manipulation and Hygiene: Moderate assistance;Sit to/from stand       Functional mobility during ADLs: Moderate assistance General ADL Comments: Pt requires mod A for balance      Vision         Perception     Praxis      Pertinent Vitals/Pain Pain Assessment: No/denies pain     Hand Dominance Right   Extremity/Trunk Assessment Upper Extremity Assessment Upper Extremity Assessment: Generalized weakness   Lower Extremity Assessment Lower Extremity Assessment: Defer to PT evaluation   Cervical / Trunk Assessment Cervical / Trunk Assessment: Other exceptions Cervical / Trunk Exceptions: h/o compression fx and kyphoplasty.    Communication Communication Communication: [facility]  Cognition Arousal/Alertness: Awake/alert Behavior During Therapy: WFL for tasks assessed/performed Overall Cognitive Status: History of cognitive impairments - at baseline                                     General Comments  discussed need for  24 hour assist with daughter and pt as well as recommendation for post acute rehab     Exercises     Shoulder Instructions      Home Living Family/patient expects to be discharged to:: Private residence Living Arrangements: Children Available Help at Discharge: Family;Available 24 hours/day Type of Home: House Home Access: Stairs to enter [employer] of Steps: 1(very small curb step) Entrance Stairs-Rails: None Home Layout: Multi-level;Bed/bath upstairs Alternate Level Stairs-Number of Steps: 13 Alternate Level Stairs-Rails: Left Bathroom Shower/Tub: Occupational psychologist: Standard     Home Equipment: Shower seat   Additional Comments: Per daughter, pt is a night owl, and is often up and down at night while son is sleeping       Prior Functioning/Environment Level of Independence: Independent        Comments: Pt does not drive, he is independent around the home, son cooks, they have a housekeeper.          OT Problem List: Decreased strength;Decreased activity tolerance;Impaired balance (sitting and/or standing);Decreased cognition;Decreased safety awareness;Decreased knowledge of use of DME or AE      OT Treatment/Interventions: Self-care/ADL training;DME and/or AE instruction;Therapeutic activities;Cognitive remediation/compensation;Patient/family education;Balance training    OT Goals(Current goals can be found in the care plan section) Acute Rehab OT Goals Patient Stated Goal: to regain independence  OT Goal Formulation: With patient/family Time For Goal Achievement: 03/06/18 Potential to Achieve Goals: Good ADL Goals Pt Will Perform Grooming: with supervision Pt Will Perform Lower Body Bathing: with supervision;sit to/from stand Pt Will Perform Lower Body Dressing: with supervision;sit to/from stand Pt Will Transfer to Toilet: with supervision;ambulating;regular height toilet;grab bars Pt Will Perform Toileting - Clothing Manipulation  and hygiene: with supervision;sit to/from stand Pt Will Perform Tub/Shower Transfer: Tub transfer;with min guard assist;ambulating  OT Frequency: Min 2X/week   Barriers to D/C: Decreased caregiver support          Co-evaluation              AM-PAC PT "6 Clicks" Daily Activity     Outcome Measure Help from another person eating meals?: None Help from another person taking care of personal grooming?: A Little Help from another person toileting, which includes using toliet, bedpan, or urinal?: A Lot Help from another person bathing (including washing, rinsing, drying)?: A Lot Help from another person to put on and taking off regular upper body clothing?: A Little Help from another person to put on and taking off regular lower body clothing?: A Lot 6 Click Score: 16   End of Session Equipment Utilized During Treatment: Gait belt Nurse Communication: Mobility status  Activity Tolerance: Patient limited by fatigue Patient left: in chair;with call bell/phone within reach;with family/visitor present  OT Visit Diagnosis: Unsteadiness on feet (R26.81);Cognitive communication deficit (R41.841)                Time: 1419-1500 OT Time Calculation (min): 41 min Charges:  OT General Charges $OT Visit: 1 Visit OT Evaluation $OT Eval Moderate Complexity: 1 Mod OT Treatments $Self Care/Home Management : 8-22 mins $Therapeutic Activity: 8-22 mins G-Codes:     [employer]  [place], OTR/L [phone]   Lucille Passy M 02/20/2018, 5:02 PM

## 2018-02-20 NOTE — Progress Notes (Signed)
PROGRESS NOTE  Derek [name] XEN:[id number] DOB: 01-16-1927 DOA: 02/19/2018 PCP: Seward Carol, MD  Brief Narrative: 84 year old man PMH stroke, dementia presented with altered mental status, right-sided weakness, found on porch outside wearing winter coat.  Was unresponsive but no seizure activity was noted.  Seen by neurology in the emergency department for right-sided weakness.  Not a candidate for TPA secondary to being outside the window.  Had witnessed seizure in CT scanner.  Admitted for further evaluation of seizure and encephalopathy.  Also noted to be febrile.  However LP was not felt to be indicated.  Assessment/Plan Seizure with associated acute encephalopathy suspected right arm Todd's paralysis. --No recurrence.  MRI brain negative.  EEG pending.  Telemetry sinus rhythm.  Continue Keppra per neurology.  Fever without any evidence to suggest sepsis or bacterial infection.  Chest x-ray, urinalysis unremarkable.  Skin appears unremarkable.  No localizing symptoms noted. --Will observe off antibiotics as the patient has returned to baseline.  CKD stage III --stable  T1 compression fracture.  Asymptomatic.  4 mm right upper lobe nodule, indeterminate. No follow-up needed if patient is low-risk. Non-contrast chest CT can be considered in 12 months if patient is high-risk. This recommendation follows the consensus statement:  Dementia --Appears stable.  Continue Aricept  Sleep apnea reportedly noncompliant with CPAP  Paroxysmal atrial fibrillation --Continue apixaban  DVT prophylaxis: apixaban Code Status: DNR Family Communication: daughter at bedside Disposition Plan: home with HHPT    Murray Hodgkins, MD  [facility] Direct contact: [phone] --[place] [place]  --[email or web address]; password [email or web address]  7PM-7AM contact night coverage as above 02/20/2018, 8:52 AM  LOS: 1 day   Consultants:  Neurology   Procedures:    Antimicrobials:    Interval  history/Subjective: Has dementia, hx limited, doesn't remember events of yesterday. Per daughter pt was outside, got hot, become unresponsive but did not pass out. Lasted 10 minutes. No focal neuro deficits at home.  Objective: Vitals:  Vitals:   02/20/18 0600 02/20/18 0828  BP: 109/62 (!) 146/72  Pulse: 76 72  Resp: 18 18  Temp: 98.8 F (37.1 C) 98.6 F (37 C)  SpO2:  98%    Exam:  Constitutional:  . Appears calm and comfortable Eyes:  . pupils and irises appear normal . Normal lids  ENMT:  . grossly normal hearing  . Lips appear normal Respiratory:  . CTA bilaterally, no w/r/r.  . Respiratory effort normal.  Cardiovascular:  . RRR, 2/6 systolic murmur LUSB . No LE extremity edema   Abdomen:  . Soft, ntnd Musculoskeletal:  . RUE, LUE, RLE, LLE   o strength and tone normal, no atrophy, no abnormal movements o No tenderness, masses Neurologic:  . CN 2-12 intact . Sensation all 4 extremities intact Psychiatric:  . Mental status o Mood, affect appropriate o Orientation to person, hospital, not month or year  I have personally reviewed the following:   Labs:  Potassium 3.4, remainder BMP unremarkable.  Creatinine has normalized, 1.10.  Lactic acid within normal limits  Hemoglobin has dropped, 10.6, likely dilution.  WBC trending down, 13.7.  Platelets 132  Imaging studies:  MRI brain no stroke.  No acute intracranial abnormality. Chest x-ray no acute disease.  CT head neck noted.  Medical tests:  EKG sinus rhythm, no acute changes  Scheduled Meds: . enoxaparin (LOVENOX) injection  90 mg Subcutaneous Q12H  . metoprolol tartrate  2.5 mg Intravenous Q8H   Continuous Infusions: . sodium chloride    . aztreonam Stopped (  02/20/18 0630)  . levETIRAcetam Stopped (02/20/18 0100)  . [START ON 02/21/2018] levofloxacin (LEVAQUIN) IV    . vancomycin      Principal Problem:   Sepsis ([place]) Active Problems:   History of CVA (cerebrovascular accident)    Dementia   Obstructive sleep apnea (adult) (pediatric)   Atrial fibrillation ([place])   Seizures ([place])   LOS: 1 day    8:50 908

## 2018-02-20 NOTE — Plan of Care (Signed)
Pt with good appetite.  No anxiety noted.  Reviewed with pt and family the importance of only getting up with assistance and use of bed alarm if no visitors will be in the room.

## 2018-02-20 NOTE — Evaluation (Signed)
Clinical/Bedside Swallow Evaluation Patient Details  Name: ROMNEY COMPEAN MRN: [id number] Date of Birth: 1927/03/12  Today's Date: 02/20/2018 Time: SLP Start Time (ACUTE ONLY): 0745 SLP Stop Time (ACUTE ONLY): 0815 SLP Time Calculation (min) (ACUTE ONLY): 30 min  Past Medical History:  Past Medical History:  Diagnosis Date  . Broken shoulder   . Cerebrovascular disease    MRI/MRA in 2007 - 50% stenosis of supraclinoid ICA, left  . Clavicle fracture   . Complex sleep apnea syndrome    adapt SV titration EEP 6 min 6 and max 15 cm water.   . Compression fracture   . Coronary atherosclerosis of native coronary artery 07/12/2013   a. NSTEMI in setting of AF with RVR 11/14 >> BMS to CFX;  b. Nuclear (9/15):  High risk - apical lateral ischemia >> c. LHC (9/15):  pLAD 50%, D1 (smaller br 90%/larger br 80%, pDx 50-60%, dLAD 50%, prox-mid CFX stent ok, pOM1 and OM2 50-70% (jailed by stent), OM5 70-80%, mPDA 90% (CFX dsz unchanged from 2014), RCA diff dsz, EF 55%, mild ant-apical HK >>  Med Rx  . CVA (cerebral vascular accident) ([place])   . Dementia   . History of pneumonia   . Hx of echocardiogram    a. Echo 11/14): EF 55-60%, no RWMA, grade 2 DD, mild aortic stenosis (mean 15 mmHg), mild LAE  . Hypertension   . MI (myocardial infarction) ([place]) 07/2013  . PAF (paroxysmal atrial fibrillation) ([facility])    hx of NSTEMI in setting of AF with RVR 07/2013;  Eliquis for AC  . Shingles    left chest  . Syncope   . TIA (transient ischemic attack) 07/12/2013   Past Surgical History:  Past Surgical History:  Procedure Laterality Date  . CATARACT EXTRACTION Bilateral   . KYPHOPLASTY    . LEFT HEART CATHETERIZATION WITH CORONARY ANGIOGRAM N/A 08/08/2013   Procedure: LEFT HEART CATHETERIZATION WITH CORONARY ANGIOGRAM;  Surgeon: Jettie Booze, MD;  Location: [facility] CATH LAB;  Service: Cardiovascular;  Laterality: N/A;  . LEFT HEART CATHETERIZATION WITH CORONARY ANGIOGRAM N/A 05/29/2014   Procedure: LEFT  HEART CATHETERIZATION WITH CORONARY ANGIOGRAM;  Surgeon: Sinclair Grooms, MD;  Location: [facility] CATH LAB;  Service: Cardiovascular;  Laterality: N/A;  . PERCUTANEOUS STENT INTERVENTION  08/08/2013   Procedure: PERCUTANEOUS STENT INTERVENTION;  Surgeon: Jettie Booze, MD;  Location: [facility] CATH LAB;  Service: Cardiovascular;;  . SHOULDER SURGERY Left   . TONSILLECTOMY     HPI:  84 year old Caucasian man with past medical history of atrial fibrillation on Eliquis, coronary artery disease, sleep apnea, dementia, past strokes with unknown residual deficits brought in by EMS when he was found to be altered and confused, and on exam had findings consistent with right hemiparesis that was gradually improving as well as possibly some right-sided neglect. He had a witnessed short duration seizure that resolved spontaneously on the CT table for about 35 to 40 seconds. MD reports he started to improve again after that making suspicion for an underlying seizure secondary to UTI  more likely than a stroke.  CT head negative for acute finding.    Assessment / Plan / Recommendation Clinical Impression  Pt demonstrates normal swallow function. Able to resume a regular diet and thin liquids. SLP will sign off for dysphagia tx.  SLP Visit Diagnosis: Dysphagia, unspecified (R13.10)    Aspiration Risk  Mild aspiration risk    Diet Recommendation Regular;Thin liquid   Liquid Administration via: Cup;Straw Medication Administration: Whole  meds with liquid Supervision: Patient able to self feed    Other  Recommendations     Follow up Recommendations        Frequency and Duration            Prognosis        Swallow Study   General HPI: 84 year old Caucasian man with past medical history of atrial fibrillation on Eliquis, coronary artery disease, sleep apnea, dementia, past strokes with unknown residual deficits brought in by EMS when he was found to be altered and confused, and on exam had findings  consistent with right hemiparesis that was gradually improving as well as possibly some right-sided neglect. He had a witnessed short duration seizure that resolved spontaneously on the CT table for about 35 to 40 seconds. MD reports he started to improve again after that making suspicion for an underlying seizure secondary to UTI  more likely than a stroke.  CT head negative for acute finding.  Type of Study: Bedside Swallow Evaluation Previous Swallow Assessment: none Diet Prior to this Study: NPO Temperature Spikes Noted: No Respiratory Status: Room air History of Recent Intubation: No Behavior/Cognition: Alert;Cooperative;Pleasant mood Oral Cavity Assessment: Within Functional Limits Oral Care Completed by SLP: No Oral Cavity - Dentition: Adequate natural dentition Vision: Functional for self-feeding Self-Feeding Abilities: Able to feed self Patient Positioning: Upright in bed Baseline Vocal Quality: Normal Volitional Cough: Strong Volitional Swallow: Able to elicit    Oral/Motor/Sensory Function Overall Oral Motor/Sensory Function: Within functional limits   Ice Chips     Thin Liquid Thin Liquid: Within functional limits Presentation: Cup;Straw    Nectar Thick Nectar Thick Liquid: Not tested   Honey Thick Honey Thick Liquid: Not tested   Puree Puree: Within functional limits   Solid   GO   Solid: Within functional limits        Berlene Dixson, Katherene Ponto 02/20/2018,8:44 AM
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
## 2018-02-20 NOTE — Evaluation (Signed)
Physical Therapy Evaluation Patient Details Name: Derek [name] MRN: [id number] DOB: [birth year] Today's Date: 02/20/2018   History of Present Illness  84 y.o. male admitted on 02/19/18 for AMS and confusion with possible R sided weakness.  Neurology more suspicious of post ictal seizure state, however, MRI and EEG are still pending.  Pt wiht significant PMH of TIA, syncope, PAF, MI, HTN, dementia, compression fx, clavicle fx, L shoulder surgery, and kyphoplasty.    Clinical Impression  Pt is weaker than his baseline level of supervision at home.  He does not typically use an AD and would benefit from short term use of a RW, but when asked he adamantly refused to use one stating, "I would rather fall flat on my face!", so PT will continue to gait train without an assistive device.  We were only able to make it safely a short distance to the bathroom and back today.  Family is open to follow up therapy.   PT to follow acutely for deficits listed below.       Follow Up Recommendations Home health PT;Supervision for mobility/OOB    Equipment Recommendations  None recommended by PT    Recommendations for Other Services   NA    Precautions / Restrictions Precautions Precautions: Fall Restrictions Weight Bearing Restrictions: No      Mobility  Bed Mobility Overal bed mobility: Needs Assistance Bed Mobility: Supine to Sit     Supine to sit: Min assist     General bed mobility comments: Min hand held assist.   Transfers Overall transfer level: Needs assistance Equipment used: 1 person hand held assist Transfers: Sit to/from Stand Sit to Stand: Min assist         General transfer comment: Min hand held assist during transition to stand.  Pt relying on legs supported against bed for balance.   Ambulation/Gait Ambulation/Gait assistance: Mod assist Gait Distance (Feet): 15 Feet Assistive device: 1 person hand held assist Gait Pattern/deviations: Shuffle;Step-through  pattern     General Gait Details: Pt with shuffling gait, short choppy steps, reaching with free hand for stability.  he would be safer with RW, but absolutely refuses, "I would rather fall on my face"       Modified Rankin (Stroke Patients Only) Modified Rankin (Stroke Patients Only) Pre-Morbid Rankin Score: Moderate disability Modified Rankin: Moderately severe disability     Balance Overall balance assessment: Needs assistance Sitting-balance support: Feet supported;No upper extremity supported;Bilateral upper extremity supported;Single extremity supported Sitting balance-Leahy Scale: Fair     Standing balance support: Bilateral upper extremity supported Standing balance-Leahy Scale: Poor                               Pertinent Vitals/Pain Pain Assessment: No/denies pain    Home Living Family/patient expects to be discharged to:: Private residence Living Arrangements: Children Available Help at Discharge: Family;Available 24 hours/day Type of Home: House Home Access: Stairs to enter Entrance Stairs-Rails: None Entrance Stairs-Number of Steps: 1(very small curb step) Home Layout: Multi-level;Bed/bath upstairs Home Equipment: Shower seat      Prior Function Level of Independence: Independent         Comments: Pt does not drive, he is independent around the home, son cooks, they have a Secretary/administrator.       Hand Dominance   Dominant Hand: Right    Extremity/Trunk Assessment   Upper Extremity Assessment Upper Extremity Assessment: Defer to OT evaluation  Lower Extremity Assessment Lower Extremity Assessment: Generalized weakness(seated MMT tested equal bil LEs)    Cervical / Trunk Assessment Cervical / Trunk Assessment: Other exceptions Cervical / Trunk Exceptions: h/o compression fx and kyphoplasty.   Communication   Communication: HOH  Cognition Arousal/Alertness: Awake/alert Behavior During Therapy: WFL for tasks  assessed/performed Overall Cognitive Status: History of cognitive impairments - at baseline                                               Assessment/Plan    PT Assessment Patient needs continued PT services  PT Problem List Decreased strength;Decreased activity tolerance;Decreased balance;Decreased mobility;Decreased cognition;Decreased knowledge of use of DME;Decreased coordination;Decreased knowledge of precautions;Decreased safety awareness       PT Treatment Interventions DME instruction;Gait training;Stair training;Functional mobility training;Balance training;Therapeutic activities;Therapeutic exercise;Cognitive remediation;Patient/family education    PT Goals (Current goals can be found in the Care Plan section)  Acute Rehab PT Goals Patient Stated Goal: to get up out of the bed.  PT Goal Formulation: With patient/family Time For Goal Achievement: 03/06/18 Potential to Achieve Goals: Good    Frequency Min 3X/week           AM-PAC PT "6 Clicks" Daily Activity  Outcome Measure Difficulty turning over in bed (including adjusting bedclothes, sheets and blankets)?: A Little Difficulty moving from lying on back to sitting on the side of the bed? : A Little Difficulty sitting down on and standing up from a chair with arms (e.g., wheelchair, bedside commode, etc,.)?: A Little Help needed moving to and from a bed to chair (including a wheelchair)?: A Lot Help needed walking in hospital room?: A Lot Help needed climbing 3-5 steps with a railing? : A Lot 6 Click Score: 15    End of Session Equipment Utilized During Treatment: Gait belt Activity Tolerance: Patient limited by fatigue Patient left: in chair;with call bell/phone within reach;with family/visitor present Nurse Communication: Mobility status PT Visit Diagnosis: Muscle weakness (generalized) (M62.81);Other symptoms and signs involving the nervous system (R29.898);Difficulty in walking, not elsewhere  classified (R26.2)    Time: 0569-7948 PT Time Calculation (min) (ACUTE ONLY): 33 min   Charges:             Wells Guiles B. Eryn Krejci, PT, DPT [phone]   PT Evaluation $PT Eval Moderate Complexity: 1 Mod PT Treatments $Therapeutic Activity: 8-22 mins

## 2018-02-20 NOTE — Progress Notes (Signed)
Rehab Admissions Coordinator Note:  Patient was screened by Cleatrice Burke for appropriateness for an Inpatient Acute Rehab Consult per OT recommendation. Pt recommends HH.  At this time, we are recommending HH. Do not fell pt has the medical neccesity required for an inpt rehab admission  Danne Baxter, RN, MSN Rehab Admissions Coordinator [phone] 02/20/2018 8:21 PM

## 2018-02-20 NOTE — Progress Notes (Addendum)
Subjective: Patient in bed awake and alert and oriented to self.  Patient denies headache, dizziness, blurred vision nausea or vomiting.  Thinks he is in [place] at [facility].  States that at baseline he ambulates without cane or walker.  Nurse denies patient having seizures overnight  Exam: Vitals:   02/20/18 0600 02/20/18 0828  BP: 109/62 (!) 146/72  Pulse: 76 72  Resp: 18 18  Temp: 98.8 F (37.1 C) 98.6 F (37 C)  SpO2:  98%    Physical Exam  HEENT-  Normocephalic, no lesions, without obvious abnormality.  Normal external eye and conjunctiva.   Cardiovascular- II/VI murmur, S1-S2 audible, pulses palpable throughout   Lungs-no rhonchi or wheezing noted, no excessive working breathing.  Saturations within normal limits Abdomen- All 4 quadrants palpated and nontender Musculoskeletal-no joint tenderness, deformity or swelling Skin-warm and dry  Neuro:  Mental Status: Alert to self, thinks he is in the room at [facility], does not know month or year, thinks it is January 2030.  Patient is pleasantly confused, but with normal concentration. Speech fluent without evidence of aphasia.  Able to follow 3 step commands without difficulty.  Naming and repetition intact. Cranial Nerves: II:  Visual fields grossly normal,  III,IV, VI: ptosis not present, extra-ocular motions intact bilaterally, pupils are small equal, round, sluggishly reactive to light  V,VII: smile symmetric, facial light touch sensation normal bilaterally VIII: hearing intact to voice IX,X: uvula rises symmetrically XI: bilateral shoulder shrug XII: midline tongue extension Motor: Patient with equal strength in all extremities and moves all extremities equally without ataxia Right : Upper extremity   5/5    Left:     Upper extremity   5/5  Lower extremity   5/5     Lower extremity   5/5 Tone and bulk:normal tone throughout; no atrophy noted Sensory: Sensation intact to temperature  Deep Tendon Reflexes: 2+ and  symmetric throughout Plantars: Right: downgoing   Left: downgoing Cerebellar: normal finger-to-nose, normal rapid alternating movements and normal heel-to-shin test Gait: Not assessed    Medications:  . enoxaparin (LOVENOX) injection  90 mg Subcutaneous Q12H  . metoprolol tartrate  2.5 mg Intravenous Q8H    Pertinent Labs/Diagnostics: Ct Angio Head/Neck W Or Wo Contrast/CTP 02/19/2018 IMPRESSION:  1. Negative CTA for large vessel occlusion. No evidence for acute infarct by CT perfusion.  2. Atherosclerotic change throughout the carotid siphons with associated moderate bilateral ICA stenoses.  3. Approximate 50% short-segment stenoses at the origin of the vertebral arteries bilaterally.  4. 50% stenosis at the origin of the right common carotid artery.  5. T1 compression fracture, acute to subacute in appearance. Correlation with history and physical exam for possible pain at this location.  6. 4 mm right upper lobe nodule, indeterminate. No follow-up needed if patient is low-risk. Non-contrast chest CT can be considered in 12 months if patient is high-risk. This recommendation follows the consensus statement: Guidelines for Management of Incidental Pulmonary Nodules Detected on CT Images: From the Fleischner Society 2017;  l  Dg Chest Portable 1 View  02/19/2018 IMPRESSION: No acute cardiopulmonary disease. Aortic Atherosclerosis   Ct Head Code Stroke Wo Contrast 02/19/2018 IMPRESSION:  1. No acute intracranial abnormality.  2. ASPECTS is 10  3. Atrophy and extensive chronic microvascular ischemia.   Assessment: 84 year old Caucasian man with past medical history of atrial fibrillation on Eliquis, coronary artery disease, sleep apnea,, past strokes with unknown residual deficits brought in by EMS when he was found to be altered  and confused, and on exam had findings consistent with right hemiparesis that was gradually improving as well as possibly some right-sided neglect. He had a  witnessed short duration seizure that resolved spontaneously on the CT table for about 35 to 40 seconds. He started to improve again after that making suspicion for an underlying seizure more likely than a stroke. Also, he is outside the window for TPA hence not a TPA candidate. CT angios and perfusion studies are negative for any emergent large vessel occlusion, hence not a candidate for endovascular thrombectomy. Most likely provoked seizure in the setting of UTI in a patient with dementia. I considered the possibility of this being meningitis, but with a normal white count, a urine that smelled abnormal and urinalysis pending, rapidly returning back to baseline, I would not consider doing an LP just about yet.  And I do not think he needs meningitic coverage as well.  1.  Rule out seizures, patient presented with altered mental status likely secondary to post ictal state with a right arm todd paralysis.  Patient is awake alert, right side weakness has since resolved with patient moving all his extremities equally.  Patient continues to be encephalopathic though, unclear if it is his baseline. Patient has been afebrile overnight, infectious work-up has been negative, UA has been negative, chest x-ray does not show any acute cardiopulmonary abnormalities.  TSH and vitamin B12 within normal limits,  2.  Altered mental status in patient with baseline dementia 3.  A. fib on anticoagulation - on Eliquis 4.  Hypertension 5.  CKD stage II 6.  Obstructive sleep apnea not compliant with CPAP  Recommendations: -Keppra has received 1 g IV Keppra yesterday and will continue Keppra 500 mg twice daily -Continue seizure precautions  Letha Cape DNP Neuro-hospitalist Team [phone] 02/20/2018, 9:47 AM   Attending Neurohospitalist Addendum Attempted to see patient. He is in MRI.  Agree with the plan as documented, which I helped formulate. I have independently reviewed the chart, obtained history,  review of systems and examined the patient.I have personally reviewed pertinent head/neck/spine imaging (CT/MRI). CTH no acute changes MRI - no stroke EEG being done. Official Report pending. Prelim - no sz. Will update the note after formal EEG eval.  Recs as above.  Per [facility] statutes, patients with seizures are not allowed to drive until they have been seizure-free for six months.   Use caution when using heavy equipment or power tools. Avoid working on ladders or at heights. Take showers instead of baths. Ensure the water temperature is not too high on the home water heater. Do not go swimming alone. Do not lock yourself in a room alone (i.e. bathroom). When caring for infants or small children, sit down when holding, feeding, or changing them to minimize risk of injury to the child in the event you have a seizure. Maintain good sleep hygiene. Avoid alcohol.   If patient has another seizure, call 911 and bring them back to the ED if: A. The seizure lasts longer than 5 minutes.  B. The patient doesn't wake shortly after the seizure or has new problems such as difficulty seeing, speaking or moving following the seizure C. The patient was injured during the seizure D. The patient has a temperature over 102 F (39C) E. The patient vomited during the seizure and now is having trouble breathing    Amie Portland, MD [facility] Pager: [phone]  If 7pm to 7am, please call on call as listed on AMION.

## 2018-02-20 NOTE — Procedures (Signed)
  ELECTROENCEPHALOGRAM REPORT  Date of Study: 02/20/18  Patient's Name: Derek [name] MRN: [id number] Date of Birth: 1927/04/28  Referring Provider: Windell Norfolk, MD  Clinical History: DIONISIO ARAGONES is a 84 y.o. Caucasian man with past medical history of atrial fibrillation on Eliquis, coronary artery disease, sleep apnea,, past strokes with unknown residual deficits brought in by EMS when he was found to be altered and confused, and on exam had findings consistent with right hemiparesis that was gradually improving as well as possibly some right-sided neglect. He had a witnessed short duration seizure that resolved spontaneously on the CT table for about 35 to 40 seconds. Most likely provoked seizure in the setting of UTI in a patient with dementia.  Head MRI (6/15) without acute abnormality.   Medications: Scheduled Meds: . apixaban  5 mg Oral BID  . donepezil  10 mg Oral QHS  . isosorbide mononitrate  30 mg Oral Daily  . metoprolol tartrate  25 mg Oral BID  . pantoprazole  40 mg Oral Daily   Continuous Infusions: . levETIRAcetam Stopped (02/20/18 1337)   PRN Meds:.acetaminophen **OR** acetaminophen (TYLENOL) oral liquid 160 mg/5 mL **OR** acetaminophen, hydrALAZINE, nitroGLYCERIN            Technical Summary: This is a standard 16 channel EEG recording performed according to the international 10-20 electrode system.  AP bipolar, transverse bipolar, and referential montages were obtained, and digitally reformatted as necessary.  Duration of tracing: 22:21  Description: Pt is noted to be alert/confused/unable to follow commands during the tracing.  In the awake state there is a 7 Hz theta rhythm seen from the posterior head regions in a symmetric fashion.  Beta and frontotemporal muscle artifact are noted.  No definite drowsiness or stage II sleep was identified.   Neither hyperventilation or photic stimulation was performed.  EKG was monitored and noted to be sinus  rhythym with an average heart rate of 66 bpm.  Impression: This is an abnormal EEG due to mild background slowing seen throughout the tracing.  This is a non-specific finding that can be seen with toxic, metabolic, diffuse, or multifocal structural processes.  No definite epileptiform changes were noted.   A single EEG without epileptiform changes does not exclude the diagnosis of epilepsy. Clinical correlation advised.   Carvel Getting, M.D. Neurology Cell [phone]

## 2018-02-20 NOTE — Progress Notes (Signed)
ANTICOAGULATION CONSULT NOTE - Initial Consult  Pharmacy Consult for lovenox Indication: atrial fibrillation  Allergies  Allergen Reactions  . Augmentin [Amoxicillin-Pot Clavulanate] Rash  . Penicillins Rash    Has patient had a PCN reaction causing immediate rash, facial/tongue/throat swelling, SOB or lightheadedness with hypotension: Yes Has patient had a PCN reaction causing severe rash involving mucus membranes or skin necrosis: Unk Has patient had a PCN reaction that required hospitalization: Yes Has patient had a PCN reaction occurring within the last 10 years: No If all of the above answers are "NO", then may proceed with Cephalosporin use.   . Sulfa Antibiotics Rash    Patient Measurements: Height: 5\' 7"  (170.2 cm) Weight: 200 lb 9.9 oz (91 kg) IBW/kg (Calculated) : 66.1 Heparin Dosing Weight: 91 kg  Vital Signs: Temp: 101 F (38.3 C) (06/14 2213) Temp Source: Rectal (06/14 2213) BP: 115/59 (06/14 2245) Pulse Rate: 83 (06/14 2245)  Labs: Recent Labs    02/19/18 1811 02/19/18 1829  HGB 15.6 15.2  HCT 46.0 45.1  PLT  --  200  APTT  --  44*  LABPROT  --  15.6*  INR  --  1.25  CREATININE 1.20 1.43*    Estimated Creatinine Clearance: 38.4 mL/min (A) (by C-G formula based on SCr of 1.43 mg/dL (H)).   Medical History: Past Medical History:  Diagnosis Date  . Broken shoulder   . Cerebrovascular disease    MRI/MRA in 2007 - 50% stenosis of supraclinoid ICA, left  . Clavicle fracture   . Complex sleep apnea syndrome    adapt SV titration EEP 6 min 6 and max 15 cm water.   . Compression fracture   . Coronary atherosclerosis of native coronary artery 07/12/2013   a. NSTEMI in setting of AF with RVR 11/14 >> BMS to CFX;  b. Nuclear (9/15):  High risk - apical lateral ischemia >> c. LHC (9/15):  pLAD 50%, D1 (smaller br 90%/larger br 80%, pDx 50-60%, dLAD 50%, prox-mid CFX stent ok, pOM1 and OM2 50-70% (jailed by stent), OM5 70-80%, mPDA 90% (CFX dsz unchanged from  2014), RCA diff dsz, EF 55%, mild ant-apical HK >>  Med Rx  . CVA (cerebral vascular accident) ([place])   . Dementia   . History of pneumonia   . Hx of echocardiogram    a. Echo 11/14): EF 55-60%, no RWMA, grade 2 DD, mild aortic stenosis (mean 15 mmHg), mild LAE  . Hypertension   . MI (myocardial infarction) ([place]) 07/2013  . PAF (paroxysmal atrial fibrillation) ([facility])    hx of NSTEMI in setting of AF with RVR 07/2013;  Eliquis for AC  . Shingles    left chest  . Syncope   . TIA (transient ischemic attack) 07/12/2013    Medications:  Medications Prior to Admission  Medication Sig Dispense Refill Last Dose  . amLODipine (NORVASC) 10 MG tablet Take 10 mg by mouth daily.   02/19/2018 at am  . atorvastatin (LIPITOR) 40 MG tablet TAKE 1 TABLET ONCE DAILY AT 6 PM. 90 tablet 0 02/18/2018 at pm  . cholecalciferol (VITAMIN D) 1000 UNITS tablet Take 1,000 Units by mouth daily.   02/19/2018 at am  . donepezil (ARICEPT) 10 MG tablet TAKE ONE TABLET AT BEDTIME. 30 tablet 5 02/18/2018 at pm  . ELIQUIS 5 MG TABS tablet TAKE 1 TABLET BY MOUTH TWICE DAILY. 60 tablet 10 02/19/2018 at 0800  . isosorbide mononitrate (IMDUR) 30 MG 24 hr tablet Take 1 tablet (30 mg total) by  mouth daily. 30 tablet 11 02/19/2018 at am  . metoprolol tartrate (LOPRESSOR) 25 MG tablet TAKE 1 TABLET BY MOUTH TWICE DAILY. 60 tablet 11 02/19/2018 at 0800  . Multiple Vitamins-Minerals (CENTRUM SILVER PO) Take 1 tablet by mouth daily.    02/19/2018 at am  . Multiple Vitamins-Minerals (PRESERVISION AREDS 2) CAPS Take 1 capsule by mouth 2 (two) times daily.    02/19/2018 at am  . nitroGLYCERIN (NITROSTAT) 0.4 MG SL tablet Place 1 tablet (0.4 mg total) under the tongue every 5 (five) minutes as needed for chest pain. 25 tablet 2 Unk at Unk  . omeprazole (PRILOSEC) 20 MG capsule Take 20 mg by mouth daily before breakfast.    02/19/2018 at am  . isosorbide mononitrate (IMDUR) 30 MG 24 hr tablet Take 1 tablet (30 mg total) by mouth daily. (Patient not  taking: Reported on 02/19/2018) 30 tablet 11 Not Taking at Unknown time    Assessment: 84 yo man to start lovenox for afib.  He was on eliquis 5mg  bid PTA, last dose this morning at 0800 Goal of Therapy:  Anti-Xa level 0.6-1 units/ml 4hrs after LMWH dose given Monitor platelets by anticoagulation protocol: Yes   Plan:  Lovenox 90 mg sq q12 hours Monitor CBC and bleeding complications  Derek [name] 02/20/2018,12:10 AM

## 2018-02-21 LAB — CBC
HEMATOCRIT: 40 % (ref 39.0–52.0)
HEMOGLOBIN: 13.3 g/dL (ref 13.0–17.0)
MCH: 29.4 pg (ref 26.0–34.0)
MCHC: 33.3 g/dL (ref 30.0–36.0)
MCV: 88.5 fL (ref 78.0–100.0)
Platelets: 167 10*3/uL (ref 150–400)
RBC: 4.52 MIL/uL (ref 4.22–5.81)
RDW: 13.3 % (ref 11.5–15.5)
WBC: 8.2 10*3/uL (ref 4.0–10.5)

## 2018-02-21 LAB — URINE CULTURE

## 2018-02-21 LAB — GLUCOSE, CAPILLARY
GLUCOSE-CAPILLARY: 91 mg/dL (ref 65–99)
GLUCOSE-CAPILLARY: 167 mg/dL — AB (ref 65–99)

## 2018-02-21 MED ORDER — LEVETIRACETAM 500 MG PO TABS: 500.0000 mg | ORAL_TABLET | Freq: Two times a day (BID) | ORAL | 1 refills | Status: DC

## 2018-02-21 NOTE — Progress Notes (Signed)
Chart review and EEG review note.  No sz or lateralizing signs on EEG. Only slowing, that is non specific. MRI was also not suggestive of abnormality. Last check, family reported pt at baseline. Will sign off with following recs:  --Keppra 500 BID --Seizure precautions including no driving for 6 months per [place] law.  Please call us with questions.  -- Amie Portland, MD [facility] Pager: [phone] If 7pm to 7am, please call on call as listed on AMION.

## 2018-02-21 NOTE — Progress Notes (Signed)
Nurse went over discharge with patient and family. Patient and family verbalized understanding. All questions and concern addressed. Discharging home with all belongings. Taking down in a wheelchair.

## 2018-02-21 NOTE — Progress Notes (Signed)
Occupational Therapy Treatment Patient Details Name: JONNATAN HANNERS MRN: [id number] DOB: 1926/12/24 Today's Date: 02/21/2018    History of present illness 84 y.o. male admitted on 02/19/18 for AMS and confusion with possible R sided weakness.  Neurology more suspicious of post ictal seizure state, however, MRI and EEG are still pending.  Pt wiht significant PMH of TIA, syncope, PAF, MI, HTN, dementia, compression fx, clavicle fx, L shoulder surgery, and kyphoplasty.     OT comments  Son, who is caregiver is present.  He watched pt ambulate and feels pt is at close to, or at his current baseline, which he said is quite variable depending on sleep and hydration.  Recommended use of shower seat in shower, and he agrees.   He also will provide direct assist with ADLs and functional mobility    Follow Up Recommendations  Home health OT;Supervision/Assistance - 24 hour    Equipment Recommendations  None recommended by OT    Recommendations for Other Services      Precautions / Restrictions Precautions Precautions: Fall       Mobility Bed Mobility                  Transfers                      Balance                                           ADL either performed or assessed with clinical judgement   ADL                                               Vision       Perception     Praxis      Cognition Arousal/Alertness: Awake/alert Behavior During Therapy: WFL for tasks assessed/performed Overall Cognitive Status: History of cognitive impairments - at baseline                                          Exercises     Shoulder Instructions       General Comments son, who is primary caregiver present. Spoke with him re: pt current deficits.  He watched pt with mobility and states pt is at or close to his current baseline, but reports pt has been slowing down over the past several months and he has  been worried about falls.  Discussed need for use of shower seat in shower and direct supervision/assist with mobility and ADLs, which son reports he has been doing of late.  Son is confident in their ability to manage pt at current level     Pertinent Vitals/ Pain       Pain Assessment: No/denies pain  Home Living                                          Prior Functioning/Environment              Frequency  Min 2X/week        Progress  Toward Goals  OT Goals(current goals can now be found in the care plan section)  Progress towards OT goals: Progressing toward goals     Plan Discharge plan remains appropriate    Co-evaluation                 AM-PAC PT "6 Clicks" Daily Activity     Outcome Measure   Help from another person eating meals?: None Help from another person taking care of personal grooming?: A Little Help from another person toileting, which includes using toliet, bedpan, or urinal?: A Little Help from another person bathing (including washing, rinsing, drying)?: A Little Help from another person to put on and taking off regular upper body clothing?: A Little Help from another person to put on and taking off regular lower body clothing?: A Little 6 Click Score: 19    End of Session Equipment Utilized During Treatment: Gait belt  OT Visit Diagnosis: Unsteadiness on feet (R26.81);Cognitive communication deficit (R41.841)   Activity Tolerance Patient tolerated treatment well   Patient Left in chair;with call bell/phone within reach;with chair alarm set   Nurse Communication Mobility status        Time: 3299-2426 OT Time Calculation (min): 21 min  Charges: OT General Charges $OT Visit: 1 Visit OT Treatments $Self Care/Home Management : 8-22 mins  [facility], OTR/L [phone]    Lucille Passy M 02/21/2018, 3:23 PM

## 2018-02-21 NOTE — Progress Notes (Signed)
Occupational Therapy Treatment Patient Details Name: Derek [name] MRN: [id number] DOB: September 30, 1926 Today's Date: 02/21/2018    History of present illness 84 y.o. male admitted on 02/19/18 for AMS and confusion with possible R sided weakness.  Neurology more suspicious of post ictal seizure state, however, MRI and EEG are still pending.  Pt wiht significant PMH of TIA, syncope, PAF, MI, HTN, dementia, compression fx, clavicle fx, L shoulder surgery, and kyphoplasty.     OT comments  Pt significantly improved, but still requires 24 hour direct supervision/assist.  He requires min guard assist, to occasional min A with functional mobility and LB ADLs due to occasional LOB.   He does fatigue quickly.   Spoke briefly with daughter who does feel they can arrange 24 hour direct assist for him for ~ 1 week.  He desires to discharge home.   Recommend HHOT/PT.    Follow Up Recommendations  Home health OT;Supervision/Assistance - 24 hour    Equipment Recommendations  None recommended by OT(Pt refuses to use RW )    Recommendations for Other Services      Precautions / Restrictions Precautions Precautions: Fall       Mobility Bed Mobility Overal bed mobility: Needs Assistance Bed Mobility: Supine to Sit     Supine to sit: Supervision        Transfers Overall transfer level: Needs assistance Equipment used: None Transfers: Sit to/from Stand;Stand Pivot Transfers Sit to Stand: Min guard Stand pivot transfers: Min guard       General transfer comment: min guard for safety and balance     Balance Overall balance assessment: Needs assistance Sitting-balance support: Feet supported;No upper extremity supported;Bilateral upper extremity supported;Single extremity supported Sitting balance-Leahy Scale: Fair Sitting balance - Comments: Pt looses balance to the Lt when donning socks, but self corrcts    Standing balance support: No upper extremity supported Standing balance-Leahy  Scale: Fair Standing balance comment: close min guard assist                            ADL either performed or assessed with clinical judgement   ADL Overall ADL's : Needs assistance/impaired     Grooming: Wash/dry hands;Wash/dry face;Oral care;Brushing hair;Standing;Min guard Grooming Details (indicate cue type and reason): verbal cues for organization and to locate items              Lower Body Dressing: Minimal assistance;Sit to/from stand Lower Body Dressing Details (indicate cue type and reason): min A for balance.  Pt initially lost balance to the Lt when donning socks  Toilet Transfer: Min guard;Ambulation;Comfort height toilet;Grab bars   Toileting- Clothing Manipulation and Hygiene: Minimal assistance;Sit to/from stand       Functional mobility during ADLs: Minimal assistance;Min guard General ADL Comments: daughter present initially, but left mid session.  She does confirm that family can arrange for direct 24 hour supervision/assist for the next week or so      Vision       Perception     Praxis      Cognition Arousal/Alertness: Awake/alert Behavior During Therapy: WFL for tasks assessed/performed Overall Cognitive Status: History of cognitive impairments - at baseline                                          Exercises     Shoulder Instructions  General Comments Pt requires occasional min A when ambulating due to mild balance loss.  He does fatiigue quickly.  Exhibits flexed posture when ambulating - requires cues to extend trunk     Pertinent Vitals/ Pain       Pain Assessment: No/denies pain  Home Living                                          Prior Functioning/Environment              Frequency  Min 2X/week        Progress Toward Goals  OT Goals(current goals can now be found in the care plan section)  Progress towards OT goals: Progressing toward goals     Plan Discharge  plan needs to be updated    Co-evaluation                 AM-PAC PT "6 Clicks" Daily Activity     Outcome Measure   Help from another person eating meals?: None Help from another person taking care of personal grooming?: A Little Help from another person toileting, which includes using toliet, bedpan, or urinal?: A Little Help from another person bathing (including washing, rinsing, drying)?: A Little Help from another person to put on and taking off regular upper body clothing?: A Little Help from another person to put on and taking off regular lower body clothing?: A Little 6 Click Score: 19    End of Session Equipment Utilized During Treatment: Gait belt  OT Visit Diagnosis: Unsteadiness on feet (R26.81);Cognitive communication deficit (R41.841)   Activity Tolerance Patient tolerated treatment well   Patient Left in chair;with call bell/phone within reach;with chair alarm set   Nurse Communication Mobility status        Time: 6962-9528 OT Time Calculation (min): 36 min  Charges: OT General Charges $OT Visit: 1 Visit OT Treatments $Self Care/Home Management : 8-22 mins $Therapeutic Activity: 8-22 mins  [facility], OTR/L [phone]    Lucille Passy M 02/21/2018, 9:58 AM

## 2018-02-21 NOTE — Discharge Summary (Signed)
Physician Discharge Summary  Derek [name] VWU:[id number] DOB: 1926/09/30 DOA: 02/19/2018  PCP: Seward Carol, MD  Admit date: 02/19/2018 Discharge date: 02/21/2018  Recommendations for Outpatient Follow-up:   Seizure with associated acute encephalopathy suspected right arm Todd's paralysis. --Keppra started --ambulatory referral to neurology placed   T1 compression fracture.  Asymptomatic.  4 mm right upper lobe nodule, indeterminate. No follow-up needed if patient is low-risk. Non-contrast chest CT can be considered in 12 months if patient is high-risk.    Follow-up Information    Seward Carol, MD. Schedule an appointment as soon as possible for a visit in 1 week(s).   Specialty:  Internal Medicine Contact information: [address] [place] [address] [phone]          Follow-up Information    Seward Carol, MD. Schedule an appointment as soon as possible for a visit in 1 week(s).   Specialty:  Internal Medicine Contact information: [address] [place] [address] [phone]           Discharge Diagnoses:  1. Seizure with associated acute encephalopathy suspected right arm Todd's paralysis. 2. CKD stage III 3. T1 compression fracture 4. 4 mm right upper lobe nodule, indeterminate 5. Dementia without behavioral disturbance 6. Paroxysmal atrial fibrillation  Discharge Condition: improved Disposition: home with HHPT  Diet recommendation: regular  Filed Weights   02/19/18 1851 02/20/18 0000  Weight: 86.2 kg (190 lb) 91 kg (200 lb 9.9 oz)    History of present illness:  84 year old man PMH stroke, dementia presented with altered mental status, right-sided weakness, found on porch outside wearing winter coat.  Was unresponsive but no seizure activity was noted.  Seen by neurology in the emergency department for right-sided weakness.  Not a candidate for TPA secondary to being outside the window.  Had  witnessed seizure in CT scanner.  Admitted for further evaluation of seizure and encephalopathy.  Also noted to be febrile.  However LP was not felt to be indicated.  Hospital Course:  Patient was observed and had no further seizures.  No further fever.  Antibiotics were discontinued and patient remained afebrile.  Further work-up was unrevealing. Ultimately nephrology recommended continuing Keppra on discharge.  Hospitalization was uncomplicated.  Individual issues as below.  Seizure with associated acute encephalopathy suspected right arm Todd's paralysis. --No recurrence.  MRI brain negative.  EEG nonspecific.  Telemetry sinus rhythm. --Continue Keppra 500 BID on discharge per neurology. --Per [facility] statutes, patients with seizures are not allowed to drive until they have been seizure-free for six months.   Use caution when using heavy equipment or power tools. Avoid working on ladders or at heights. Take showers instead of baths. Ensure the water temperature is not too high on the home water heater. Do not go swimming alone. Do not lock yourself in a room alone (i.e. bathroom). When caring for infants or small children, sit down when holding, feeding, or changing them to minimize risk of injury to the child in the event you have a seizure. Maintain good sleep hygiene. Avoid alcohol.   If patienthas another seizure, call 911 and bring them back to the ED if: A. The seizure lasts longer than 5 minutes.  B. The patient doesn't wake shortly after the seizure or has new problems such as difficulty seeing, speaking or moving following the seizure C. The patient was injured during the seizure D. The patient has a temperature over 102 F (39C) E. The patient  vomited during the seizure and now is having trouble breathing  Fever without any evidence to suggest sepsis or bacterial infection.  Chest x-ray, urinalysis unremarkable.  Skin appears unremarkable.  No localizing symptoms  noted. --afebrile >24 hours off abx. No evidence of infection, likely related to seizure  CKD stage III --stable  T1 compression fracture.  Asymptomatic.  4 mm right upper lobe nodule, indeterminate. No follow-up needed if patient is low-risk. Non-contrast chest CT can be considered in 12 months if patient is high-risk. This recommendation follows the consensus statement:  Dementia --Stable.  Continue Aricept  Sleep apnea reportedly noncompliant with CPAP  Paroxysmal atrial fibrillation --Continue apixaban on discharge  Consultants:  Neurology    Procedures: EEG  Impression: This is an abnormal EEG due to mild background slowing seen throughout the tracing.  This is a non-specific finding that can be seen with toxic, metabolic, diffuse, or multifocal structural processes.  No definite epileptiform changes were noted.   A single EEG without epileptiform changes does not exclude the diagnosis of epilepsy. Clinical correlation advised.  Today's assessment: S: Feels fine, no complaints. O: Vitals:  Vitals:   02/21/18 0735 02/21/18 1300  BP: (!) 150/72   Pulse: (!) 58 63  Resp:  18  Temp: 97.8 F (36.6 C) 98.2 F (36.8 C)  SpO2: 96%     Constitutional:  . Appears calm and comfortable, sitting in chair.  Son at bedside. Respiratory:  . CTA bilaterally, no w/r/r.  . Respiratory effort normal Cardiovascular:  . RRR, no m/r/g Psychiatric:  . Mental status o Mood, affect appropriate  Hemoglobin stable, 13.3.  Platelets within normal limits, 167.  Discharge Instructions  Discharge Instructions    Ambulatory referral to Neurology   Complete by:  As directed    An appointment is requested in approximately: 4 weeks, follow-up new seizure   Diet general   Complete by:  As directed    Discharge instructions   Complete by:  As directed    Call your physician or seek immediate medical attention for seizure, new confusion, passing out, excessive shaking or  worsening of condition. Per [facility] statutes, patients with seizures are not allowed to drive until they have been seizure-free for six months.   Use caution when using heavy equipment or power tools. Avoid working on ladders or at heights. Take showers instead of baths. Ensure the water temperature is not too high on the home water heater. Do not go swimming alone. Do not lock yourself in a room alone (i.e. bathroom). When caring for infants or small children, sit down when holding, feeding, or changing them to minimize risk of injury to the child in the event you have a seizure. Maintain good sleep hygiene. Avoid alcohol.   If patienthas another seizure, call 911 and bring them back to the ED if: A. The seizure lasts longer than 5 minutes.  B. The patient doesn't wake shortly after the seizure or has new problems such as difficulty seeing, speaking or moving following the seizure C. The patient was injured during the seizure D. The patient has a temperature over 102 F (39C) E. The patient vomited during the seizure and now is having trouble breathing   Increase activity slowly   Complete by:  As directed      Allergies as of 02/21/2018      Reactions   Augmentin [amoxicillin-pot Clavulanate] Rash   Penicillins Rash   Has patient had a PCN reaction causing immediate rash, facial/tongue/throat  swelling, SOB or lightheadedness with hypotension: Yes Has patient had a PCN reaction causing severe rash involving mucus membranes or skin necrosis: Unk Has patient had a PCN reaction that required hospitalization: Yes Has patient had a PCN reaction occurring within the last 10 years: No If all of the above answers are "NO", then may proceed with Cephalosporin use.   Sulfa Antibiotics Rash      Medication List    TAKE these medications   amLODipine 10 MG tablet Commonly known as:  NORVASC Take 10 mg by mouth daily.   atorvastatin 40 MG tablet Commonly known as:   LIPITOR TAKE 1 TABLET ONCE DAILY AT 6 PM.   CENTRUM SILVER PO Take 1 tablet by mouth daily. What changed:  Another medication with the same name was removed. Continue taking this medication, and follow the directions you see here.   cholecalciferol 1000 units tablet Commonly known as:  VITAMIN D Take 1,000 Units by mouth daily.   donepezil 10 MG tablet Commonly known as:  ARICEPT TAKE ONE TABLET AT BEDTIME.   ELIQUIS 5 MG Tabs tablet Generic drug:  apixaban TAKE 1 TABLET BY MOUTH TWICE DAILY.   isosorbide mononitrate 30 MG 24 hr tablet Commonly known as:  IMDUR Take 1 tablet (30 mg total) by mouth daily. What changed:  Another medication with the same name was removed. Continue taking this medication, and follow the directions you see here.   levETIRAcetam 500 MG tablet Commonly known as:  KEPPRA Take 1 tablet (500 mg total) by mouth 2 (two) times daily.   metoprolol tartrate 25 MG tablet Commonly known as:  LOPRESSOR TAKE 1 TABLET BY MOUTH TWICE DAILY.   nitroGLYCERIN 0.4 MG SL tablet Commonly known as:  NITROSTAT Place 1 tablet (0.4 mg total) under the tongue every 5 (five) minutes as needed for chest pain.   omeprazole 20 MG capsule Commonly known as:  PRILOSEC Take 20 mg by mouth daily before breakfast.      Allergies  Allergen Reactions  . Augmentin [Amoxicillin-Pot Clavulanate] Rash  . Penicillins Rash    Has patient had a PCN reaction causing immediate rash, facial/tongue/throat swelling, SOB or lightheadedness with hypotension: Yes Has patient had a PCN reaction causing severe rash involving mucus membranes or skin necrosis: Unk Has patient had a PCN reaction that required hospitalization: Yes Has patient had a PCN reaction occurring within the last 10 years: No If all of the above answers are "NO", then may proceed with Cephalosporin use.   . Sulfa Antibiotics Rash    The results of significant diagnostics from this hospitalization (including imaging,  microbiology, ancillary and laboratory) are listed below for reference.    Significant Diagnostic Studies: Ct Angio Head W Or Wo Contrast  Result Date: 02/19/2018 CLINICAL DATA:  Initial evaluation for acute stroke. EXAM: CT ANGIOGRAPHY HEAD AND NECK CT PERFUSION BRAIN TECHNIQUE: Multidetector CT imaging of the head and neck was performed using the standard protocol during bolus administration of intravenous contrast. Multiplanar CT image reconstructions and MIPs were obtained to evaluate the vascular anatomy. Carotid stenosis measurements (when applicable) are obtained utilizing NASCET criteria, using the distal internal carotid diameter as the denominator. Multiphase CT imaging of the brain was performed following IV bolus contrast injection. Subsequent parametric perfusion maps were calculated using RAPID software. CONTRAST:  124mL ISOVUE-370 IOPAMIDOL (ISOVUE-370) INJECTION 76%; 14mL ISOVUE-370 IOPAMIDOL (ISOVUE-370) INJECTION 76% COMPARISON:  Prior head CT from earlier the same day. FINDINGS: CTA NECK FINDINGS Aortic arch: Aortic arch of normal  caliber with normal branch pattern. Moderate atheromatous plaque about the arch itself. No flow-limiting stenosis about the origin of the great vessels. Subclavian arteries patent. Right carotid system: Mixed plaque about the right bifurcation/proximal right ICA without hemodynamically significant stenosis. Right common and internal carotid arteries otherwise widely patent. Left carotid system: Left common carotid artery widely patent to the bifurcation. Eccentric calcified plaque at the proximal left ICA without significant stenosis. Left ICA widely patent distally to the skull base. Vertebral arteries: Both of the vertebral arteries arise from the subclavian arteries. Atheromatous plaque at the origin of the vertebral arteries with approximate 50% stenosis bilaterally. Vertebral arteries otherwise patent within the neck without stenosis, dissection, or occlusion.  Skeleton: Compression deformity involving the T5 vertebral body with up to 50% height loss, acute to subacute in appearance. Congenital fusion of C5 and C6 noted. No discrete osseous lesions. Prominent dental carie noted at the right maxilla. Other neck: Negative. Upper chest: Shotty subcentimeter lymph nodes noted within the upper mediastinum 4 mm right upper lobe nodule (series 5, image 178), indeterminate. Review of the MIP images confirms the above findings CTA HEAD FINDINGS Anterior circulation: Scattered atheromatous plaque throughout the petrous, cavernous, and supraclinoid segments with resultant approximate 50% moderate stenosis at the para clinoid ICAs bilaterally. ICA termini patent. A1 segments and anterior communicating artery patent. Atheromatous irregularity throughout the ACAs without flow-limiting stenosis, which are patent to their distal aspects. M1 segments irregular but patent bilaterally without high-grade stenosis. Right M1 bifurcates early. No proximal M2 occlusion. Distal MCA branches well perfused and symmetric but demonstrate atheromatous irregularity. Posterior circulation: Left V4 segment as it crosses into the cranial vault. Vertebral arteries otherwise widely patent. Posterior inferior cerebral arteries patent bilaterally. Basilar artery demonstrates multifocal atheromatous irregularity but is patent to its distal aspect without high-grade stenosis. SCA is patent bilaterally, with severe stenosis at the proximal left SCA. PCA supplied via the basilar and are well perfused to their distal aspects without high-grade stenosis. Small vessel atheromatous irregularity within the distal PCAs bilaterally. Venous sinuses: Grossly patent, although not well assessed due to arterial timing of the contrast bolus. Anatomic variants: None significant. Delayed phase: Not performed. Review of the MIP images confirms the above findings CT Brain Perfusion Findings: CBF (<30%) Volume: 55mL Perfusion  (Tmax>6.0s) volume: 67mL Mismatch Volume: 75mL Infarction Location:Negative. IMPRESSION: 1. Negative CTA for large vessel occlusion. No evidence for acute infarct by CT perfusion. 2. Atherosclerotic change throughout the carotid siphons with associated moderate bilateral ICA stenoses. 3. Approximate 50% short-segment stenoses at the origin of the vertebral arteries bilaterally. 4. 50% stenosis at the origin of the right common carotid artery. 5. T1 compression fracture, acute to subacute in appearance. Correlation with history and physical exam for possible pain at this location. 6. 4 mm right upper lobe nodule, indeterminate. No follow-up needed if patient is low-risk. Non-contrast chest CT can be considered in 12 months if patient is high-risk. This recommendation follows the consensus statement: Guidelines for Management of Incidental Pulmonary Nodules Detected on CT Images: From the Fleischner Society 2017; Radiology 2017; 284:228-243. Electronically Signed   By: Jeannine Boga M.D.   On: 02/19/2018 19:03   Ct Angio Neck W Or Wo Contrast  Result Date: 02/19/2018 CLINICAL DATA:  Initial evaluation for acute stroke. EXAM: CT ANGIOGRAPHY HEAD AND NECK CT PERFUSION BRAIN TECHNIQUE: Multidetector CT imaging of the head and neck was performed using the standard protocol during bolus administration of intravenous contrast. Multiplanar CT image reconstructions and MIPs were obtained  to evaluate the vascular anatomy. Carotid stenosis measurements (when applicable) are obtained utilizing NASCET criteria, using the distal internal carotid diameter as the denominator. Multiphase CT imaging of the brain was performed following IV bolus contrast injection. Subsequent parametric perfusion maps were calculated using RAPID software. CONTRAST:  134mL ISOVUE-370 IOPAMIDOL (ISOVUE-370) INJECTION 76%; 28mL ISOVUE-370 IOPAMIDOL (ISOVUE-370) INJECTION 76% COMPARISON:  Prior head CT from earlier the same day. FINDINGS: CTA NECK  FINDINGS Aortic arch: Aortic arch of normal caliber with normal branch pattern. Moderate atheromatous plaque about the arch itself. No flow-limiting stenosis about the origin of the great vessels. Subclavian arteries patent. Right carotid system: Mixed plaque about the right bifurcation/proximal right ICA without hemodynamically significant stenosis. Right common and internal carotid arteries otherwise widely patent. Left carotid system: Left common carotid artery widely patent to the bifurcation. Eccentric calcified plaque at the proximal left ICA without significant stenosis. Left ICA widely patent distally to the skull base. Vertebral arteries: Both of the vertebral arteries arise from the subclavian arteries. Atheromatous plaque at the origin of the vertebral arteries with approximate 50% stenosis bilaterally. Vertebral arteries otherwise patent within the neck without stenosis, dissection, or occlusion. Skeleton: Compression deformity involving the T5 vertebral body with up to 50% height loss, acute to subacute in appearance. Congenital fusion of C5 and C6 noted. No discrete osseous lesions. Prominent dental carie noted at the right maxilla. Other neck: Negative. Upper chest: Shotty subcentimeter lymph nodes noted within the upper mediastinum 4 mm right upper lobe nodule (series 5, image 178), indeterminate. Review of the MIP images confirms the above findings CTA HEAD FINDINGS Anterior circulation: Scattered atheromatous plaque throughout the petrous, cavernous, and supraclinoid segments with resultant approximate 50% moderate stenosis at the para clinoid ICAs bilaterally. ICA termini patent. A1 segments and anterior communicating artery patent. Atheromatous irregularity throughout the ACAs without flow-limiting stenosis, which are patent to their distal aspects. M1 segments irregular but patent bilaterally without high-grade stenosis. Right M1 bifurcates early. No proximal M2 occlusion. Distal MCA branches  well perfused and symmetric but demonstrate atheromatous irregularity. Posterior circulation: Left V4 segment as it crosses into the cranial vault. Vertebral arteries otherwise widely patent. Posterior inferior cerebral arteries patent bilaterally. Basilar artery demonstrates multifocal atheromatous irregularity but is patent to its distal aspect without high-grade stenosis. SCA is patent bilaterally, with severe stenosis at the proximal left SCA. PCA supplied via the basilar and are well perfused to their distal aspects without high-grade stenosis. Small vessel atheromatous irregularity within the distal PCAs bilaterally. Venous sinuses: Grossly patent, although not well assessed due to arterial timing of the contrast bolus. Anatomic variants: None significant. Delayed phase: Not performed. Review of the MIP images confirms the above findings CT Brain Perfusion Findings: CBF (<30%) Volume: 70mL Perfusion (Tmax>6.0s) volume: 11mL Mismatch Volume: 9mL Infarction Location:Negative. IMPRESSION: 1. Negative CTA for large vessel occlusion. No evidence for acute infarct by CT perfusion. 2. Atherosclerotic change throughout the carotid siphons with associated moderate bilateral ICA stenoses. 3. Approximate 50% short-segment stenoses at the origin of the vertebral arteries bilaterally. 4. 50% stenosis at the origin of the right common carotid artery. 5. T1 compression fracture, acute to subacute in appearance. Correlation with history and physical exam for possible pain at this location. 6. 4 mm right upper lobe nodule, indeterminate. No follow-up needed if patient is low-risk. Non-contrast chest CT can be considered in 12 months if patient is high-risk. This recommendation follows the consensus statement: Guidelines for Management of Incidental Pulmonary Nodules Detected on CT Images: From  the Fleischner Society 2017; Radiology 2017; [phone]. Electronically Signed   By: Jeannine Boga M.D.   On: 02/19/2018 19:03    Mr Jeri Cos LK Contrast  Result Date: 02/20/2018 CLINICAL DATA:  84 y/o M; Seizure, new, abn neuro exam, nontraumatic. EXAM: MRI HEAD WITHOUT AND WITH CONTRAST TECHNIQUE: Multiplanar, multiecho pulse sequences of the brain and surrounding structures were obtained without and with intravenous contrast. CONTRAST:  29mL MULTIHANCE GADOBENATE DIMEGLUMINE 529 MG/ML IV SOLN COMPARISON:  CT head, CTA head, and CT perfusion head dated 02/19/2018. 02/04/2011 MRI head. FINDINGS: Brain: Motion degradation of several sequences. Stable small chronic infarction within the left cerebellar hemisphere and the left pons. There are small chronic lacunar infarcts within the bilateral thalami and lentiform nuclei. Stable patchy nonspecific foci of T2 FLAIR hyperintense signal abnormality in subcortical and periventricular white matter are compatible with chronic microvascular ischemic and there is stable brain parenchymal volume loss. No reduced diffusion to suggest acute or early subacute infarction. Punctate foci of susceptibility hypointensity present in the right cerebellum in a nonspecific distribution compatible with hemosiderin deposition of chronic microhemorrhage. No focal mass effect, extra-axial collection, herniation, or effacement of basilar cisterns. Stable ventricle size. Hippocampi are symmetric in size and signal. After administration of intravenous contrast there is NO abnormal enhancement of the brain. Vascular: Normal flow voids. Skull and upper cervical spine: Normal marrow signal. Sinuses/Orbits: Left mastoid air cell mucosal thickening. Normal signal of paranasal sinuses. Bilateral intra-ocular lens replacement. Other: None. IMPRESSION: 1. Motion degradation of several sequences. 2. No acute intracranial abnormality. No structural cause of seizure identified. 3. Stable moderate for age chronic microvascular ischemic changes and parenchymal volume loss of the brain. Electronically Signed   By: Kristine Garbe M.D.   On: 02/20/2018 13:19   Ct Cerebral Perfusion W Contrast  Result Date: 02/19/2018 CLINICAL DATA:  Initial evaluation for acute stroke. EXAM: CT ANGIOGRAPHY HEAD AND NECK CT PERFUSION BRAIN TECHNIQUE: Multidetector CT imaging of the head and neck was performed using the standard protocol during bolus administration of intravenous contrast. Multiplanar CT image reconstructions and MIPs were obtained to evaluate the vascular anatomy. Carotid stenosis measurements (when applicable) are obtained utilizing NASCET criteria, using the distal internal carotid diameter as the denominator. Multiphase CT imaging of the brain was performed following IV bolus contrast injection. Subsequent parametric perfusion maps were calculated using RAPID software. CONTRAST:  147mL ISOVUE-370 IOPAMIDOL (ISOVUE-370) INJECTION 76%; 35mL ISOVUE-370 IOPAMIDOL (ISOVUE-370) INJECTION 76% COMPARISON:  Prior head CT from earlier the same day. FINDINGS: CTA NECK FINDINGS Aortic arch: Aortic arch of normal caliber with normal branch pattern. Moderate atheromatous plaque about the arch itself. No flow-limiting stenosis about the origin of the great vessels. Subclavian arteries patent. Right carotid system: Mixed plaque about the right bifurcation/proximal right ICA without hemodynamically significant stenosis. Right common and internal carotid arteries otherwise widely patent. Left carotid system: Left common carotid artery widely patent to the bifurcation. Eccentric calcified plaque at the proximal left ICA without significant stenosis. Left ICA widely patent distally to the skull base. Vertebral arteries: Both of the vertebral arteries arise from the subclavian arteries. Atheromatous plaque at the origin of the vertebral arteries with approximate 50% stenosis bilaterally. Vertebral arteries otherwise patent within the neck without stenosis, dissection, or occlusion. Skeleton: Compression deformity involving the T5 vertebral  body with up to 50% height loss, acute to subacute in appearance. Congenital fusion of C5 and C6 noted. No discrete osseous lesions. Prominent dental carie noted at the right maxilla. Other neck:  Negative. Upper chest: Shotty subcentimeter lymph nodes noted within the upper mediastinum 4 mm right upper lobe nodule (series 5, image 178), indeterminate. Review of the MIP images confirms the above findings CTA HEAD FINDINGS Anterior circulation: Scattered atheromatous plaque throughout the petrous, cavernous, and supraclinoid segments with resultant approximate 50% moderate stenosis at the para clinoid ICAs bilaterally. ICA termini patent. A1 segments and anterior communicating artery patent. Atheromatous irregularity throughout the ACAs without flow-limiting stenosis, which are patent to their distal aspects. M1 segments irregular but patent bilaterally without high-grade stenosis. Right M1 bifurcates early. No proximal M2 occlusion. Distal MCA branches well perfused and symmetric but demonstrate atheromatous irregularity. Posterior circulation: Left V4 segment as it crosses into the cranial vault. Vertebral arteries otherwise widely patent. Posterior inferior cerebral arteries patent bilaterally. Basilar artery demonstrates multifocal atheromatous irregularity but is patent to its distal aspect without high-grade stenosis. SCA is patent bilaterally, with severe stenosis at the proximal left SCA. PCA supplied via the basilar and are well perfused to their distal aspects without high-grade stenosis. Small vessel atheromatous irregularity within the distal PCAs bilaterally. Venous sinuses: Grossly patent, although not well assessed due to arterial timing of the contrast bolus. Anatomic variants: None significant. Delayed phase: Not performed. Review of the MIP images confirms the above findings CT Brain Perfusion Findings: CBF (<30%) Volume: 70mL Perfusion (Tmax>6.0s) volume: 31mL Mismatch Volume: 79mL Infarction  Location:Negative. IMPRESSION: 1. Negative CTA for large vessel occlusion. No evidence for acute infarct by CT perfusion. 2. Atherosclerotic change throughout the carotid siphons with associated moderate bilateral ICA stenoses. 3. Approximate 50% short-segment stenoses at the origin of the vertebral arteries bilaterally. 4. 50% stenosis at the origin of the right common carotid artery. 5. T1 compression fracture, acute to subacute in appearance. Correlation with history and physical exam for possible pain at this location. 6. 4 mm right upper lobe nodule, indeterminate. No follow-up needed if patient is low-risk. Non-contrast chest CT can be considered in 12 months if patient is high-risk. This recommendation follows the consensus statement: Guidelines for Management of Incidental Pulmonary Nodules Detected on CT Images: From the Fleischner Society 2017; Radiology 2017; 284:228-243. Electronically Signed   By: Jeannine Boga M.D.   On: 02/19/2018 19:03   Dg Chest Portable 1 View  Result Date: 02/19/2018 CLINICAL DATA:  Fever. EXAM: PORTABLE CHEST 1 VIEW COMPARISON:  12/22/2017 FINDINGS: Reverse apical lordotic positioning with the chin overlying the upper lungs. Midline trachea. Mild cardiomegaly. Atherosclerosis in the transverse aorta. No pleural effusion or pneumothorax. Clear lungs. IMPRESSION: No acute cardiopulmonary disease. Aortic Atherosclerosis (ICD10-I70.0). Electronically Signed   By: Abigail Miyamoto M.D.   On: 02/19/2018 19:24   Ct Head Code Stroke Wo Contrast  Result Date: 02/19/2018 CLINICAL DATA:  Code stroke. Altered level of consciousness. Right-sided weakness, aphasia EXAM: CT HEAD WITHOUT CONTRAST TECHNIQUE: Contiguous axial images were obtained from the base of the skull through the vertex without intravenous contrast. COMPARISON:  CT head 03/17/2012 FINDINGS: Brain: Moderate atrophy. Extensive chronic microvascular ischemic change throughout the white matter. Negative for acute  infarct.  Negative for acute hemorrhage or mass. Vascular: Negative for hyperdense vessel Skull: Negative Sinuses/Orbits: Mild mucosal edema paranasal sinuses. Bilateral cataract surgery Other: None ASPECTS ([place] Stroke Program Early CT Score) - Ganglionic level infarction (caudate, lentiform nuclei, internal capsule, insula, M1-M3 cortex): 7 - Supraganglionic infarction (M4-M6 cortex): 3 Total score (0-10 with 10 being normal): 10 IMPRESSION: 1. No acute intracranial abnormality. 2. ASPECTS is 10 3. Atrophy and extensive chronic microvascular ischemia.  4. These results were called by telephone at the time of interpretation on 02/19/2018 at 6:15 pm to Dr. Rory Percy , who verbally acknowledged these results. Electronically Signed   By: Franchot Gallo M.D.   On: 02/19/2018 18:16    Microbiology: Recent Results (from the past 240 hour(s))  Blood culture (routine x 2)     Status: None (Preliminary result)   Collection Time: 02/19/18  8:20 PM  Result Value Ref Range Status   Specimen Description BLOOD LEFT FOREARM  Final   Special Requests   Final    BOTTLES DRAWN AEROBIC AND ANAEROBIC Blood Culture adequate volume   Culture   Final    NO GROWTH 2 DAYS Performed at [facility], [address] [place], [place] [address]    Report Status PENDING  Incomplete  Blood culture (routine x 2)     Status: None (Preliminary result)   Collection Time: 02/19/18  8:50 PM  Result Value Ref Range Status   Specimen Description BLOOD LEFT ANTECUBITAL  Final   Special Requests   Final    BOTTLES DRAWN AEROBIC AND ANAEROBIC Blood Culture adequate volume   Culture   Final    NO GROWTH 2 DAYS Performed at [facility], [place] [address] [place], [place] [address]    Report Status PENDING  Incomplete  Urine culture     Status: Abnormal   Collection Time: 02/19/18  9:53 PM  Result Value Ref Range Status   Specimen Description URINE, CLEAN CATCH  Final   Special Requests   Final    NONE Performed at  [facility], [place] [address], [place], [place] [address]    Culture MULTIPLE SPECIES PRESENT, SUGGEST RECOLLECTION (A)  Final   Report Status 02/21/2018 FINAL  Final     Labs: Basic Metabolic Panel: Recent Labs  Lab 02/19/18 1811 02/19/18 1829 02/20/18 0633  NA 136 137 136  K 4.1 4.6 3.4*  CL 100* 102 111  CO2  --  25 19*  GLUCOSE 129* 136* 84  BUN 18 16 13   CREATININE 1.20 1.43* 1.10  CALCIUM  --  9.6 6.9*   Liver Function Tests: Recent Labs  Lab 02/19/18 1829  AST 32  ALT 22  ALKPHOS 87  BILITOT 1.1  PROT 7.2  ALBUMIN 3.9  CBC: Recent Labs  Lab 02/19/18 1811 02/19/18 1829 02/20/18 0633 02/21/18 1224  WBC  --  20.5* 13.7* 8.2  NEUTROABS  --  17.9*  --   --   HGB 15.6 15.2 10.6* 13.3  HCT 46.0 45.1 32.0* 40.0  MCV  --  86.7 88.4 88.5  PLT  --  200 132* 167    CBG: Recent Labs  Lab 02/20/18 1032 02/20/18 1829 02/20/18 2357 02/21/18 0607 02/21/18 1140  GLUCAP 151* 133* 122* 91 167*    Principal Problem:   Seizures ([place]) Active Problems:   History of CVA (cerebrovascular accident)   Dementia   Obstructive sleep apnea (adult) (pediatric)   Atrial fibrillation (HCC)   CKD (chronic kidney disease), stage III (HCC)   Closed wedge compression fracture of T1 vertebra (HCC)   Lung nodule < 6cm on CT   Time coordinating discharge: 35 minutes  Signed:  Murray Hodgkins, MD [facility] 02/21/2018, 2:01 PM

## 2018-02-21 NOTE — Care Management Note (Signed)
Case Management Note  Patient Details  Name: Derek [name] MRN: [id number] Date of Birth: 1927-05-12  Subjective/Objective:        Pt presented from home with son for seizure and encephalopathy.  Pt's son states they have used [facility] before for patient's wife and and would like to use again.  Pt has all necessary DME.            Action/Plan: Referral called to [facility] with [facility].   Expected Discharge Date:  02/21/18               Expected Discharge Plan:  [place]  In-House Referral:  NA  Discharge planning Services  CM Consult  Post Acute Care Choice:  NA Choice offered to:     DME Arranged:  N/A DME Agency:  NA  HH Arranged:  PT, OT HH Agency:  [place]  Status of Service:  Completed, signed off  If discussed at [place] of Stay Meetings, dates discussed:    Additional Comments:  Claudie Leach, RN 02/21/2018, 2:22 PM

## 2018-02-22 DIAGNOSIS — N183 Chronic kidney disease, stage 3 (moderate): Secondary | ICD-10-CM | POA: Diagnosis not present

## 2018-02-22 DIAGNOSIS — R918 Other nonspecific abnormal finding of lung field: Secondary | ICD-10-CM | POA: Diagnosis not present

## 2018-02-22 DIAGNOSIS — I480 Paroxysmal atrial fibrillation: Secondary | ICD-10-CM | POA: Diagnosis not present

## 2018-02-22 DIAGNOSIS — I2510 Atherosclerotic heart disease of native coronary artery without angina pectoris: Secondary | ICD-10-CM | POA: Diagnosis not present

## 2018-02-22 DIAGNOSIS — R569 Unspecified convulsions: Secondary | ICD-10-CM | POA: Diagnosis not present

## 2018-02-22 DIAGNOSIS — I129 Hypertensive chronic kidney disease with stage 1 through stage 4 chronic kidney disease, or unspecified chronic kidney disease: Secondary | ICD-10-CM | POA: Diagnosis not present

## 2018-02-22 DIAGNOSIS — F0390 Unspecified dementia without behavioral disturbance: Secondary | ICD-10-CM | POA: Diagnosis not present

## 2018-02-22 DIAGNOSIS — G4733 Obstructive sleep apnea (adult) (pediatric): Secondary | ICD-10-CM | POA: Diagnosis not present

## 2018-02-22 DIAGNOSIS — S22019D Unspecified fracture of first thoracic vertebra, subsequent encounter for fracture with routine healing: Secondary | ICD-10-CM | POA: Diagnosis not present

## 2018-02-24 LAB — CULTURE, BLOOD (ROUTINE X 2)
Culture: NO GROWTH
Culture: NO GROWTH
SPECIAL REQUESTS: ADEQUATE
Special Requests: ADEQUATE

## 2018-02-26 DIAGNOSIS — N183 Chronic kidney disease, stage 3 (moderate): Secondary | ICD-10-CM | POA: Diagnosis not present

## 2018-02-26 DIAGNOSIS — R569 Unspecified convulsions: Secondary | ICD-10-CM | POA: Diagnosis not present

## 2018-02-26 DIAGNOSIS — S22019D Unspecified fracture of first thoracic vertebra, subsequent encounter for fracture with routine healing: Secondary | ICD-10-CM | POA: Diagnosis not present

## 2018-02-26 DIAGNOSIS — I129 Hypertensive chronic kidney disease with stage 1 through stage 4 chronic kidney disease, or unspecified chronic kidney disease: Secondary | ICD-10-CM | POA: Diagnosis not present

## 2018-02-26 DIAGNOSIS — F0390 Unspecified dementia without behavioral disturbance: Secondary | ICD-10-CM | POA: Diagnosis not present

## 2018-02-26 DIAGNOSIS — I2510 Atherosclerotic heart disease of native coronary artery without angina pectoris: Secondary | ICD-10-CM | POA: Diagnosis not present

## 2018-03-02 DIAGNOSIS — I129 Hypertensive chronic kidney disease with stage 1 through stage 4 chronic kidney disease, or unspecified chronic kidney disease: Secondary | ICD-10-CM | POA: Diagnosis not present

## 2018-03-02 DIAGNOSIS — I2510 Atherosclerotic heart disease of native coronary artery without angina pectoris: Secondary | ICD-10-CM | POA: Diagnosis not present

## 2018-03-02 DIAGNOSIS — R569 Unspecified convulsions: Secondary | ICD-10-CM | POA: Diagnosis not present

## 2018-03-02 DIAGNOSIS — N183 Chronic kidney disease, stage 3 (moderate): Secondary | ICD-10-CM | POA: Diagnosis not present

## 2018-03-02 DIAGNOSIS — S22019D Unspecified fracture of first thoracic vertebra, subsequent encounter for fracture with routine healing: Secondary | ICD-10-CM | POA: Diagnosis not present

## 2018-03-02 DIAGNOSIS — F0390 Unspecified dementia without behavioral disturbance: Secondary | ICD-10-CM | POA: Diagnosis not present

## 2018-03-04 DIAGNOSIS — R569 Unspecified convulsions: Secondary | ICD-10-CM | POA: Diagnosis not present

## 2018-03-04 DIAGNOSIS — I2510 Atherosclerotic heart disease of native coronary artery without angina pectoris: Secondary | ICD-10-CM | POA: Diagnosis not present

## 2018-03-04 DIAGNOSIS — I129 Hypertensive chronic kidney disease with stage 1 through stage 4 chronic kidney disease, or unspecified chronic kidney disease: Secondary | ICD-10-CM | POA: Diagnosis not present

## 2018-03-04 DIAGNOSIS — S22019D Unspecified fracture of first thoracic vertebra, subsequent encounter for fracture with routine healing: Secondary | ICD-10-CM | POA: Diagnosis not present

## 2018-03-04 DIAGNOSIS — F0390 Unspecified dementia without behavioral disturbance: Secondary | ICD-10-CM | POA: Diagnosis not present

## 2018-03-04 DIAGNOSIS — N183 Chronic kidney disease, stage 3 (moderate): Secondary | ICD-10-CM | POA: Diagnosis not present

## 2018-03-08 DIAGNOSIS — R569 Unspecified convulsions: Secondary | ICD-10-CM | POA: Diagnosis not present

## 2018-03-08 DIAGNOSIS — F0390 Unspecified dementia without behavioral disturbance: Secondary | ICD-10-CM | POA: Diagnosis not present

## 2018-03-09 DIAGNOSIS — I129 Hypertensive chronic kidney disease with stage 1 through stage 4 chronic kidney disease, or unspecified chronic kidney disease: Secondary | ICD-10-CM | POA: Diagnosis not present

## 2018-03-09 DIAGNOSIS — S22019D Unspecified fracture of first thoracic vertebra, subsequent encounter for fracture with routine healing: Secondary | ICD-10-CM | POA: Diagnosis not present

## 2018-03-09 DIAGNOSIS — R569 Unspecified convulsions: Secondary | ICD-10-CM | POA: Diagnosis not present

## 2018-03-09 DIAGNOSIS — F0390 Unspecified dementia without behavioral disturbance: Secondary | ICD-10-CM | POA: Diagnosis not present

## 2018-03-09 DIAGNOSIS — N183 Chronic kidney disease, stage 3 (moderate): Secondary | ICD-10-CM | POA: Diagnosis not present

## 2018-03-09 DIAGNOSIS — I2510 Atherosclerotic heart disease of native coronary artery without angina pectoris: Secondary | ICD-10-CM | POA: Diagnosis not present

## 2018-03-11 DIAGNOSIS — R569 Unspecified convulsions: Secondary | ICD-10-CM | POA: Diagnosis not present

## 2018-03-11 DIAGNOSIS — N183 Chronic kidney disease, stage 3 (moderate): Secondary | ICD-10-CM | POA: Diagnosis not present

## 2018-03-11 DIAGNOSIS — I2510 Atherosclerotic heart disease of native coronary artery without angina pectoris: Secondary | ICD-10-CM | POA: Diagnosis not present

## 2018-03-11 DIAGNOSIS — F0390 Unspecified dementia without behavioral disturbance: Secondary | ICD-10-CM | POA: Diagnosis not present

## 2018-03-11 DIAGNOSIS — S22019D Unspecified fracture of first thoracic vertebra, subsequent encounter for fracture with routine healing: Secondary | ICD-10-CM | POA: Diagnosis not present

## 2018-03-11 DIAGNOSIS — I129 Hypertensive chronic kidney disease with stage 1 through stage 4 chronic kidney disease, or unspecified chronic kidney disease: Secondary | ICD-10-CM | POA: Diagnosis not present

## 2018-03-15 DIAGNOSIS — I129 Hypertensive chronic kidney disease with stage 1 through stage 4 chronic kidney disease, or unspecified chronic kidney disease: Secondary | ICD-10-CM | POA: Diagnosis not present

## 2018-03-15 DIAGNOSIS — F0390 Unspecified dementia without behavioral disturbance: Secondary | ICD-10-CM | POA: Diagnosis not present

## 2018-03-15 DIAGNOSIS — I2510 Atherosclerotic heart disease of native coronary artery without angina pectoris: Secondary | ICD-10-CM | POA: Diagnosis not present

## 2018-03-15 DIAGNOSIS — S22019D Unspecified fracture of first thoracic vertebra, subsequent encounter for fracture with routine healing: Secondary | ICD-10-CM | POA: Diagnosis not present

## 2018-03-15 DIAGNOSIS — R569 Unspecified convulsions: Secondary | ICD-10-CM | POA: Diagnosis not present

## 2018-03-15 DIAGNOSIS — N183 Chronic kidney disease, stage 3 (moderate): Secondary | ICD-10-CM | POA: Diagnosis not present

## 2018-03-17 DIAGNOSIS — F0390 Unspecified dementia without behavioral disturbance: Secondary | ICD-10-CM | POA: Diagnosis not present

## 2018-03-17 DIAGNOSIS — I129 Hypertensive chronic kidney disease with stage 1 through stage 4 chronic kidney disease, or unspecified chronic kidney disease: Secondary | ICD-10-CM | POA: Diagnosis not present

## 2018-03-17 DIAGNOSIS — N183 Chronic kidney disease, stage 3 (moderate): Secondary | ICD-10-CM | POA: Diagnosis not present

## 2018-03-17 DIAGNOSIS — R569 Unspecified convulsions: Secondary | ICD-10-CM | POA: Diagnosis not present

## 2018-03-17 DIAGNOSIS — I2510 Atherosclerotic heart disease of native coronary artery without angina pectoris: Secondary | ICD-10-CM | POA: Diagnosis not present

## 2018-03-17 DIAGNOSIS — S22019D Unspecified fracture of first thoracic vertebra, subsequent encounter for fracture with routine healing: Secondary | ICD-10-CM | POA: Diagnosis not present

## 2018-03-23 DIAGNOSIS — R569 Unspecified convulsions: Secondary | ICD-10-CM | POA: Diagnosis not present

## 2018-03-23 DIAGNOSIS — I2510 Atherosclerotic heart disease of native coronary artery without angina pectoris: Secondary | ICD-10-CM | POA: Diagnosis not present

## 2018-03-23 DIAGNOSIS — N183 Chronic kidney disease, stage 3 (moderate): Secondary | ICD-10-CM | POA: Diagnosis not present

## 2018-03-23 DIAGNOSIS — F0390 Unspecified dementia without behavioral disturbance: Secondary | ICD-10-CM | POA: Diagnosis not present

## 2018-03-23 DIAGNOSIS — S22019D Unspecified fracture of first thoracic vertebra, subsequent encounter for fracture with routine healing: Secondary | ICD-10-CM | POA: Diagnosis not present

## 2018-03-23 DIAGNOSIS — I129 Hypertensive chronic kidney disease with stage 1 through stage 4 chronic kidney disease, or unspecified chronic kidney disease: Secondary | ICD-10-CM | POA: Diagnosis not present

## 2018-03-25 DIAGNOSIS — I2510 Atherosclerotic heart disease of native coronary artery without angina pectoris: Secondary | ICD-10-CM | POA: Diagnosis not present

## 2018-03-25 DIAGNOSIS — R569 Unspecified convulsions: Secondary | ICD-10-CM | POA: Diagnosis not present

## 2018-03-25 DIAGNOSIS — I129 Hypertensive chronic kidney disease with stage 1 through stage 4 chronic kidney disease, or unspecified chronic kidney disease: Secondary | ICD-10-CM | POA: Diagnosis not present

## 2018-03-25 DIAGNOSIS — F0390 Unspecified dementia without behavioral disturbance: Secondary | ICD-10-CM | POA: Diagnosis not present

## 2018-03-25 DIAGNOSIS — N183 Chronic kidney disease, stage 3 (moderate): Secondary | ICD-10-CM | POA: Diagnosis not present

## 2018-03-25 DIAGNOSIS — S22019D Unspecified fracture of first thoracic vertebra, subsequent encounter for fracture with routine healing: Secondary | ICD-10-CM | POA: Diagnosis not present

## 2018-03-30 DIAGNOSIS — I2510 Atherosclerotic heart disease of native coronary artery without angina pectoris: Secondary | ICD-10-CM | POA: Diagnosis not present

## 2018-03-30 DIAGNOSIS — N183 Chronic kidney disease, stage 3 (moderate): Secondary | ICD-10-CM | POA: Diagnosis not present

## 2018-03-30 DIAGNOSIS — S22019D Unspecified fracture of first thoracic vertebra, subsequent encounter for fracture with routine healing: Secondary | ICD-10-CM | POA: Diagnosis not present

## 2018-03-30 DIAGNOSIS — F0390 Unspecified dementia without behavioral disturbance: Secondary | ICD-10-CM | POA: Diagnosis not present

## 2018-03-30 DIAGNOSIS — I129 Hypertensive chronic kidney disease with stage 1 through stage 4 chronic kidney disease, or unspecified chronic kidney disease: Secondary | ICD-10-CM | POA: Diagnosis not present

## 2018-03-30 DIAGNOSIS — R569 Unspecified convulsions: Secondary | ICD-10-CM | POA: Diagnosis not present

## 2018-04-01 DIAGNOSIS — N183 Chronic kidney disease, stage 3 (moderate): Secondary | ICD-10-CM | POA: Diagnosis not present

## 2018-04-01 DIAGNOSIS — I2510 Atherosclerotic heart disease of native coronary artery without angina pectoris: Secondary | ICD-10-CM | POA: Diagnosis not present

## 2018-04-01 DIAGNOSIS — R569 Unspecified convulsions: Secondary | ICD-10-CM | POA: Diagnosis not present

## 2018-04-01 DIAGNOSIS — F0390 Unspecified dementia without behavioral disturbance: Secondary | ICD-10-CM | POA: Diagnosis not present

## 2018-04-01 DIAGNOSIS — S22019D Unspecified fracture of first thoracic vertebra, subsequent encounter for fracture with routine healing: Secondary | ICD-10-CM | POA: Diagnosis not present

## 2018-04-01 DIAGNOSIS — I129 Hypertensive chronic kidney disease with stage 1 through stage 4 chronic kidney disease, or unspecified chronic kidney disease: Secondary | ICD-10-CM | POA: Diagnosis not present

## 2018-04-02 DIAGNOSIS — R569 Unspecified convulsions: Secondary | ICD-10-CM | POA: Diagnosis not present

## 2018-04-02 DIAGNOSIS — S22019D Unspecified fracture of first thoracic vertebra, subsequent encounter for fracture with routine healing: Secondary | ICD-10-CM | POA: Diagnosis not present

## 2018-04-02 DIAGNOSIS — N183 Chronic kidney disease, stage 3 (moderate): Secondary | ICD-10-CM | POA: Diagnosis not present

## 2018-04-02 DIAGNOSIS — I129 Hypertensive chronic kidney disease with stage 1 through stage 4 chronic kidney disease, or unspecified chronic kidney disease: Secondary | ICD-10-CM | POA: Diagnosis not present

## 2018-04-02 DIAGNOSIS — F0390 Unspecified dementia without behavioral disturbance: Secondary | ICD-10-CM | POA: Diagnosis not present

## 2018-04-02 DIAGNOSIS — I2510 Atherosclerotic heart disease of native coronary artery without angina pectoris: Secondary | ICD-10-CM | POA: Diagnosis not present

## 2018-04-05 ENCOUNTER — Encounter: Payer: Self-pay | Admitting: Neurology

## 2018-04-05 ENCOUNTER — Ambulatory Visit (INDEPENDENT_AMBULATORY_CARE_PROVIDER_SITE_OTHER): Payer: Medicare Other | Admitting: Neurology

## 2018-04-05 VITALS — BP 138/78 | HR 52 | Wt 188.0 lb

## 2018-04-05 DIAGNOSIS — F0280 Dementia in other diseases classified elsewhere without behavioral disturbance: Secondary | ICD-10-CM

## 2018-04-05 DIAGNOSIS — I2510 Atherosclerotic heart disease of native coronary artery without angina pectoris: Secondary | ICD-10-CM | POA: Diagnosis not present

## 2018-04-05 DIAGNOSIS — R569 Unspecified convulsions: Secondary | ICD-10-CM

## 2018-04-05 DIAGNOSIS — G309 Alzheimer's disease, unspecified: Secondary | ICD-10-CM

## 2018-04-05 NOTE — Progress Notes (Signed)
Reason for visit: Dementia  Derek [name] is an 84 y.o. male  History of present illness:  Derek [name] is an 84 year old right-handed white male with a history of a progressive dementing illness.  The patient is on Aricept taking 10 mg daily.  The patient comes in with a new problem.  He was in the hospital on 19 February 2018 with what was felt to be a seizure.  The patient was outside wearing a winter coat, he apparently had a fever of 102.7.  The patient had a witnessed seizure in the hospital while undergoing a CT scan of the brain.  The patient had MRI of the brain, 2D echocardiogram CT angiogram of the head and neck.  No acute stroke was noted, the patient does have chronic moderate level small vessel disease.  No significant large or medium artery stenosis was identified.  The patient was felt to have had a seizure event.  EEG study showed nonspecific slowing.  He was placed on Keppra, but he could not tolerate the medication secondary to drowsiness.  The patient was taken off of Keppra, he has not had any further events.  The patient does have other episodes that have been going on for about a year.  The patient will have episodes of confused speech may last 30 to 45 minutes and then clear up.  The patient has had 2 events over the last 3 months.  The patient has also had syncopal events that may occur if he eats too much during a meal, he will become blanched in the face and then may faint.  If the family sees him becoming fainting, they lie him down before they can prevent a blackout.  The patient has had 2 events such as this over the last 6 months.  The patient returns to the office today for an evaluation.  At baseline, he tends to have some hesitancy of speech.  Past Medical History:  Diagnosis Date  . Broken shoulder   . Cerebrovascular disease    MRI/MRA in 2007 - 50% stenosis of supraclinoid ICA, left  . Clavicle fracture   . Closed wedge compression fracture of T1 vertebra ([place])  02/20/2018  . Complex sleep apnea syndrome    adapt SV titration EEP 6 min 6 and max 15 cm water.   . Compression fracture   . Coronary atherosclerosis of native coronary artery 07/12/2013   a. NSTEMI in setting of AF with RVR 11/14 >> BMS to CFX;  b. Nuclear (9/15):  High risk - apical lateral ischemia >> c. LHC (9/15):  pLAD 50%, D1 (smaller br 90%/larger br 80%, pDx 50-60%, dLAD 50%, prox-mid CFX stent ok, pOM1 and OM2 50-70% (jailed by stent), OM5 70-80%, mPDA 90% (CFX dsz unchanged from 2014), RCA diff dsz, EF 55%, mild ant-apical HK >>  Med Rx  . CVA (cerebral vascular accident) ([place])   . Dementia   . History of pneumonia   . Hx of echocardiogram    a. Echo 11/14): EF 55-60%, no RWMA, grade 2 DD, mild aortic stenosis (mean 15 mmHg), mild LAE  . Hypertension   . Lung nodule < 6cm on CT 02/20/2018  . MI (myocardial infarction) ([place]) 07/2013  . PAF (paroxysmal atrial fibrillation) (HCC)    hx of NSTEMI in setting of AF with RVR 07/2013;  Eliquis for AC  . Shingles    left chest  . Syncope   . TIA (transient ischemic attack) 07/12/2013    Past Surgical  History:  Procedure Laterality Date  . CATARACT EXTRACTION Bilateral   . KYPHOPLASTY    . LEFT HEART CATHETERIZATION WITH CORONARY ANGIOGRAM N/A 08/08/2013   Procedure: LEFT HEART CATHETERIZATION WITH CORONARY ANGIOGRAM;  Surgeon: Jettie Booze, MD;  Location: [facility] CATH LAB;  Service: Cardiovascular;  Laterality: N/A;  . LEFT HEART CATHETERIZATION WITH CORONARY ANGIOGRAM N/A 05/29/2014   Procedure: LEFT HEART CATHETERIZATION WITH CORONARY ANGIOGRAM;  Surgeon: Sinclair Grooms, MD;  Location: [facility] CATH LAB;  Service: Cardiovascular;  Laterality: N/A;  . PERCUTANEOUS STENT INTERVENTION  08/08/2013   Procedure: PERCUTANEOUS STENT INTERVENTION;  Surgeon: Jettie Booze, MD;  Location: [facility] CATH LAB;  Service: Cardiovascular;;  . SHOULDER SURGERY Left   . TONSILLECTOMY      Family History  Problem Relation Age of Onset  . Diabetes  Mother   . Diabetes Father   . Cancer - Lung Brother   . Gait disorder Brother   . Cancer Daughter   . Stroke Sister   . Heart attack Neg Hx     Social history:  reports that he has never smoked. He has never used smokeless tobacco. He reports that he drinks about 0.6 oz of alcohol per week. He reports that he does not use drugs.    Allergies  Allergen Reactions  . Augmentin [Amoxicillin-Pot Clavulanate] Rash  . Keppra [Levetiracetam]     Trouble with walking, fatigued  . Penicillins Rash    Has patient had a PCN reaction causing immediate rash, facial/tongue/throat swelling, SOB or lightheadedness with hypotension: Yes Has patient had a PCN reaction causing severe rash involving mucus membranes or skin necrosis: Unk Has patient had a PCN reaction that required hospitalization: Yes Has patient had a PCN reaction occurring within the last 10 years: No If all of the above answers are "NO", then may proceed with Cephalosporin use.   . Sulfa Antibiotics Rash    Medications:  Prior to Admission medications   Medication Sig Start Date End Date Taking? Authorizing Provider  amLODipine (NORVASC) 10 MG tablet Take 10 mg by mouth daily.   Yes [provider]  atorvastatin (LIPITOR) 40 MG tablet TAKE 1 TABLET ONCE DAILY AT 6 PM. 05/02/16  Yes Belva Crome, MD  cholecalciferol (VITAMIN D) 1000 UNITS tablet Take 1,000 Units by mouth daily.   Yes [provider]  donepezil (ARICEPT) 10 MG tablet TAKE ONE TABLET AT BEDTIME. 02/18/18  Yes Kathrynn Ducking, MD  ELIQUIS 5 MG TABS tablet TAKE 1 TABLET BY MOUTH TWICE DAILY. 02/18/18  Yes Belva Crome, MD  isosorbide mononitrate (IMDUR) 30 MG 24 hr tablet Take 1 tablet (30 mg total) by mouth daily. 01/20/18  Yes Belva Crome, MD  metoprolol tartrate (LOPRESSOR) 25 MG tablet TAKE 1 TABLET BY MOUTH TWICE DAILY. 01/20/18  Yes Belva Crome, MD  Multiple Vitamins-Minerals (CENTRUM SILVER PO) Take 1 tablet by mouth daily.    Yes  [provider]  nitroGLYCERIN (NITROSTAT) 0.4 MG SL tablet Place 1 tablet (0.4 mg total) under the tongue every 5 (five) minutes as needed for chest pain. 12/22/17  Yes Imogene Burn, PA-C  omeprazole (PRILOSEC) 20 MG capsule Take 20 mg by mouth daily before breakfast.    Yes [provider]    ROS:  Out of a complete 14 system review of symptoms, the patient complains only of the following symptoms, and all other reviewed systems are negative.  Cold intolerance Sleep apnea, daytime sleepiness Incontinence of the bladder,  frequency of urination Walking difficulty Memory loss, speech difficulty, passing out Confusion  Blood pressure 138/78, pulse (!) 52, weight 188 lb (85.3 kg), SpO2 96 %.  Physical Exam  General: The patient is alert and cooperative at the time of the examination.  Skin: 1+ edema below the knees is seen bilaterally.   Neurologic Exam  Mental status: The patient is alert and oriented x 2 at the time of the examination (not oriented to date). The Mini-Mental status examination done today shows a total score 22/30.   Cranial nerves: Facial symmetry is present. Speech is somewhat hesitant, nonfluent and fragmented.  Extraocular movements are full. Visual fields are full.  Motor: The patient has good strength in all 4 extremities.  Sensory examination: Soft touch sensation is symmetric on the face, arms, and legs.  Coordination: The patient has good finger-nose-finger and heel-to-shin bilaterally.  Some apraxia with use of the extremities is noted.  Gait and station: The patient has a normal gait. Romberg is negative. No drift is seen.  Reflexes: Deep tendon reflexes are symmetric.   MRI brain 02/20/18:  IMPRESSION: 1. Motion degradation of several sequences. 2. No acute intracranial abnormality. No structural cause of seizure identified. 3. Stable moderate for age chronic microvascular ischemic changes and parenchymal volume loss of  the brain.  * MRI scan images were reviewed online. I agree with the written report.   CTA head and neck 02/19/18:  IMPRESSION: 1. Negative CTA for large vessel occlusion. No evidence for acute infarct by CT perfusion. 2. Atherosclerotic change throughout the carotid siphons with associated moderate bilateral ICA stenoses. 3. Approximate 50% short-segment stenoses at the origin of the vertebral arteries bilaterally. 4. 50% stenosis at the origin of the right common carotid artery. 5. T1 compression fracture, acute to subacute in appearance. Correlation with history and physical exam for possible pain at this location. 6. 4 mm right upper lobe nodule, indeterminate.   EEG 02/20/18:  Impression: This is an abnormal EEG due to mild background slowing seen throughout the tracing.  This is a non-specific finding that can be seen with toxic, metabolic, diffuse, or multifocal structural processes.  No definite epileptiform changes were noted.   A single EEG without epileptiform changes does not exclude the diagnosis of epilepsy. Clinical correlation advised.    Assessment/Plan:  1.  Progressive dementia  2.  Small vessel disease by MRI brain  3.  Seizure event  4.  History of syncope  The patient does have a moderate level small vessel disease by MRI of the brain.  The patient underwent a recent stroke work-up that did not show a new stroke episode.  The patient is on anticoagulation.  He may have had a heat stroke, his body temperature was high on admission.  The family has taken him off of Keppra at this point, this may be reasonable.  If the episodes of speech disturbance become more frequent, we may restart him empirically on a seizure medication.  The patient will follow-up in 6 months.  He will continue the current dose of Aricept for now.  Jill Alexanders MD 04/05/2018 2:28 PM  [facility] [address] [place], [place] [address]  Phone  [phone] Fax [phone]

## 2018-04-06 DIAGNOSIS — I2510 Atherosclerotic heart disease of native coronary artery without angina pectoris: Secondary | ICD-10-CM | POA: Diagnosis not present

## 2018-04-06 DIAGNOSIS — I129 Hypertensive chronic kidney disease with stage 1 through stage 4 chronic kidney disease, or unspecified chronic kidney disease: Secondary | ICD-10-CM | POA: Diagnosis not present

## 2018-04-06 DIAGNOSIS — R569 Unspecified convulsions: Secondary | ICD-10-CM | POA: Diagnosis not present

## 2018-04-06 DIAGNOSIS — F0390 Unspecified dementia without behavioral disturbance: Secondary | ICD-10-CM | POA: Diagnosis not present

## 2018-04-06 DIAGNOSIS — S22019D Unspecified fracture of first thoracic vertebra, subsequent encounter for fracture with routine healing: Secondary | ICD-10-CM | POA: Diagnosis not present

## 2018-04-06 DIAGNOSIS — N183 Chronic kidney disease, stage 3 (moderate): Secondary | ICD-10-CM | POA: Diagnosis not present

## 2018-04-08 DIAGNOSIS — S22019D Unspecified fracture of first thoracic vertebra, subsequent encounter for fracture with routine healing: Secondary | ICD-10-CM | POA: Diagnosis not present

## 2018-04-08 DIAGNOSIS — N183 Chronic kidney disease, stage 3 (moderate): Secondary | ICD-10-CM | POA: Diagnosis not present

## 2018-04-08 DIAGNOSIS — I129 Hypertensive chronic kidney disease with stage 1 through stage 4 chronic kidney disease, or unspecified chronic kidney disease: Secondary | ICD-10-CM | POA: Diagnosis not present

## 2018-04-08 DIAGNOSIS — I2510 Atherosclerotic heart disease of native coronary artery without angina pectoris: Secondary | ICD-10-CM | POA: Diagnosis not present

## 2018-04-08 DIAGNOSIS — F0390 Unspecified dementia without behavioral disturbance: Secondary | ICD-10-CM | POA: Diagnosis not present

## 2018-04-08 DIAGNOSIS — R569 Unspecified convulsions: Secondary | ICD-10-CM | POA: Diagnosis not present

## 2018-04-13 DIAGNOSIS — I2510 Atherosclerotic heart disease of native coronary artery without angina pectoris: Secondary | ICD-10-CM | POA: Diagnosis not present

## 2018-04-13 DIAGNOSIS — S22019D Unspecified fracture of first thoracic vertebra, subsequent encounter for fracture with routine healing: Secondary | ICD-10-CM | POA: Diagnosis not present

## 2018-04-13 DIAGNOSIS — R569 Unspecified convulsions: Secondary | ICD-10-CM | POA: Diagnosis not present

## 2018-04-13 DIAGNOSIS — F0390 Unspecified dementia without behavioral disturbance: Secondary | ICD-10-CM | POA: Diagnosis not present

## 2018-04-13 DIAGNOSIS — I129 Hypertensive chronic kidney disease with stage 1 through stage 4 chronic kidney disease, or unspecified chronic kidney disease: Secondary | ICD-10-CM | POA: Diagnosis not present

## 2018-04-13 DIAGNOSIS — N183 Chronic kidney disease, stage 3 (moderate): Secondary | ICD-10-CM | POA: Diagnosis not present

## 2018-04-28 ENCOUNTER — Encounter: Payer: Self-pay | Admitting: Podiatry

## 2018-04-28 ENCOUNTER — Ambulatory Visit (INDEPENDENT_AMBULATORY_CARE_PROVIDER_SITE_OTHER): Payer: Medicare Other | Admitting: Podiatry

## 2018-04-28 DIAGNOSIS — D689 Coagulation defect, unspecified: Secondary | ICD-10-CM

## 2018-04-28 DIAGNOSIS — B351 Tinea unguium: Secondary | ICD-10-CM

## 2018-04-28 DIAGNOSIS — M79676 Pain in unspecified toe(s): Secondary | ICD-10-CM

## 2018-04-28 NOTE — Progress Notes (Signed)
Patient ID: Derek [name], male   DOB: 1927/02/10, 84 y.o.   MRN: [id number] Complaint:  Visit Type: Patient returns to my office for continued preventative foot care services. Complaint: Patient states" my nails have grown long and thick and become painful to walk and wear shoes" . The patient presents for preventative foot care services. No changes to ROS  Podiatric Exam: Vascular: dorsalis pedis and posterior tibial pulses are not  palpable bilateral. Capillary return is immediate. Temperature gradient is WNL. Skin turgor WNL  Sensorium: Normal Semmes Weinstein monofilament test. Normal tactile sensation bilaterally. Nail Exam: Pt has thick disfigured discolored nails with subungual debris noted bilateral entire nail hallux through fifth toenails Ulcer Exam: There is no evidence of ulcer or pre-ulcerative changes or infection. Orthopedic Exam: Muscle tone and strength are WNL. No limitations in general ROM. No crepitus or effusions noted. Foot type and digits show no abnormalities. Bony prominences are unremarkable. Skin: No Porokeratosis. No infection or ulcers.    Diagnosis:  Onychomycosis, , Pain in right toe, pain in left toes.    Treatment & Plan Procedures and Treatment: Consent by patient was obtained for treatment procedures. The patient understood the discussion of treatment and procedures well. All questions were answered thoroughly reviewed. Debridement of mycotic and hypertrophic toenails, 1 through 5 bilateral and clearing of subungual debris. No ulceration, no infection noted.  ABN signed for 2019.   Return Visit-Office Procedure: Patient instructed to return to the office for a follow up visit 8 weeks. for continued evaluation and treatment.     Gardiner Barefoot DPM

## 2018-06-09 DIAGNOSIS — Z23 Encounter for immunization: Secondary | ICD-10-CM | POA: Diagnosis not present

## 2018-07-06 ENCOUNTER — Ambulatory Visit: Payer: Medicare Other | Admitting: Podiatry

## 2018-07-12 DIAGNOSIS — I350 Nonrheumatic aortic (valve) stenosis: Secondary | ICD-10-CM | POA: Diagnosis not present

## 2018-07-12 DIAGNOSIS — N400 Enlarged prostate without lower urinary tract symptoms: Secondary | ICD-10-CM | POA: Diagnosis not present

## 2018-07-12 DIAGNOSIS — Z1389 Encounter for screening for other disorder: Secondary | ICD-10-CM | POA: Diagnosis not present

## 2018-07-12 DIAGNOSIS — F0390 Unspecified dementia without behavioral disturbance: Secondary | ICD-10-CM | POA: Diagnosis not present

## 2018-07-12 DIAGNOSIS — E7800 Pure hypercholesterolemia, unspecified: Secondary | ICD-10-CM | POA: Diagnosis not present

## 2018-07-12 DIAGNOSIS — G40909 Epilepsy, unspecified, not intractable, without status epilepticus: Secondary | ICD-10-CM | POA: Diagnosis not present

## 2018-07-12 DIAGNOSIS — N183 Chronic kidney disease, stage 3 (moderate): Secondary | ICD-10-CM | POA: Diagnosis not present

## 2018-07-12 DIAGNOSIS — I2510 Atherosclerotic heart disease of native coronary artery without angina pectoris: Secondary | ICD-10-CM | POA: Diagnosis not present

## 2018-07-12 DIAGNOSIS — G4730 Sleep apnea, unspecified: Secondary | ICD-10-CM | POA: Diagnosis not present

## 2018-07-12 DIAGNOSIS — Z0000 Encounter for general adult medical examination without abnormal findings: Secondary | ICD-10-CM | POA: Diagnosis not present

## 2018-07-12 DIAGNOSIS — I480 Paroxysmal atrial fibrillation: Secondary | ICD-10-CM | POA: Diagnosis not present

## 2018-07-12 DIAGNOSIS — I10 Essential (primary) hypertension: Secondary | ICD-10-CM | POA: Diagnosis not present

## 2018-07-12 DIAGNOSIS — F322 Major depressive disorder, single episode, severe without psychotic features: Secondary | ICD-10-CM | POA: Diagnosis not present

## 2018-07-22 DIAGNOSIS — H40053 Ocular hypertension, bilateral: Secondary | ICD-10-CM | POA: Diagnosis not present

## 2018-08-16 ENCOUNTER — Other Ambulatory Visit: Payer: Self-pay | Admitting: Neurology

## 2018-10-07 ENCOUNTER — Ambulatory Visit: Payer: Medicare Other | Admitting: Neurology

## 2019-01-15 ENCOUNTER — Other Ambulatory Visit: Payer: Self-pay | Admitting: Interventional Cardiology

## 2019-02-27 ENCOUNTER — Other Ambulatory Visit: Payer: Self-pay | Admitting: Neurology

## 2019-02-27 ENCOUNTER — Other Ambulatory Visit: Payer: Self-pay | Admitting: Interventional Cardiology

## 2019-02-28 NOTE — Telephone Encounter (Signed)
Pt is an 89yom requesting eliquis 5mg . Pt has a wt of 85.3kg, scr 1.37(07/12/18), lov w/michele lenze(01/14/18 outdated) will grant one month refill and add a note to pharmacy stating that the pt will need a cardiology appt for further refills

## 2019-03-30 ENCOUNTER — Other Ambulatory Visit: Payer: Self-pay | Admitting: Physician Assistant

## 2019-03-30 ENCOUNTER — Other Ambulatory Visit: Payer: Self-pay | Admitting: Neurology

## 2019-03-30 ENCOUNTER — Other Ambulatory Visit: Payer: Self-pay | Admitting: Interventional Cardiology

## 2019-03-30 NOTE — Telephone Encounter (Signed)
New Message   Patient has an appointment with PA Estella Husk on 05/04/19 at 12:30pm. Needs refill for prescription until he can be seen on that date.   *STAT* If patient is at the pharmacy, call can be transferred to refill team.   1. Which medications need to be refilled? (please list name of each medication and dose if known) ELIQUIS 5 MG TABS tablet  2. Which pharmacy/location (including street and city if local pharmacy) is medication to be sent to? [place], [place]  3. Do they need a 30 day or 90 day supply? 90 day supply

## 2019-03-30 NOTE — Telephone Encounter (Signed)
Eliquis 5mg  refill request received; pt is 85 yrs old, wt-85.3kg, Crea-1.10 on 02/20/2018 (needs updated labs), last seen by Estella Husk in 01/14/2018-needs an appt; spoke with dtr Ernesta Amble and updated her and explained her to her the situation and she was transferred to make an appt.

## 2019-03-30 NOTE — Telephone Encounter (Signed)
Last OV 01/14/18 has appintment scheduled for august Will give 90 day supply no refills until seen Scr 1.3 on 07/12/18 85 year old 85kg

## 2019-04-04 ENCOUNTER — Ambulatory Visit (INDEPENDENT_AMBULATORY_CARE_PROVIDER_SITE_OTHER): Payer: Medicare Other | Admitting: Neurology

## 2019-04-04 ENCOUNTER — Encounter: Payer: Self-pay | Admitting: Neurology

## 2019-04-04 ENCOUNTER — Other Ambulatory Visit: Payer: Self-pay

## 2019-04-04 ENCOUNTER — Encounter

## 2019-04-04 VITALS — BP 122/62 | HR 53 | Temp 97.5°F | Wt 185.0 lb

## 2019-04-04 DIAGNOSIS — F0280 Dementia in other diseases classified elsewhere without behavioral disturbance: Secondary | ICD-10-CM

## 2019-04-04 DIAGNOSIS — G309 Alzheimer's disease, unspecified: Secondary | ICD-10-CM | POA: Diagnosis not present

## 2019-04-04 MED ORDER — DONEPEZIL HCL 10 MG PO TABS: 10.0000 mg | ORAL_TABLET | Freq: Every day | ORAL | 3 refills | Status: DC

## 2019-04-04 NOTE — Progress Notes (Signed)
Reason for visit: Dementia  Derek [name] is an 85 y.o. male  History of present illness:  Derek [name] is an 85 year old right-handed white male with a history of a progressive memory disturbance.  The patient has not been seen in 1 year.  He remains on Aricept taking 10 mg in the evening, at times he may have vivid dreams, he may come out of the dream somewhat confused.  This is a rare occurrence, however.  The patient continues to have progression of his memory, particularly over the last 2 months.  His son is now living with him, and is caring for him.  The patient is able to dress himself and bathe himself, he does have occasional urinary incontinence.  He requires assistance keeping up with medications, appointments, and the finances.  He does not operate a motor vehicle.  He does have a 5 stairs he needs to go up and down, but he holds onto the railing doing this.  He has not had any falls but is ability to walk is becoming more unsteady, and he overall is much slower than he used to be.  The patient still has a tendency for postprandial syncope, he may get diaphoretic after large meal, if he lies down for 5 minutes he feels better.  He has not had any overt syncopal events.  He is off Keppra.  In the past, he cannot tolerate Namenda.  Past Medical History:  Diagnosis Date  . Broken shoulder   . Cerebrovascular disease    MRI/MRA in 2007 - 50% stenosis of supraclinoid ICA, left  . Clavicle fracture   . Closed wedge compression fracture of T1 vertebra ([place]) 02/20/2018  . Complex sleep apnea syndrome    adapt SV titration EEP 6 min 6 and max 15 cm water.   . Compression fracture   . Coronary atherosclerosis of native coronary artery 07/12/2013   a. NSTEMI in setting of AF with RVR 11/14 >> BMS to CFX;  b. Nuclear (9/15):  High risk - apical lateral ischemia >> c. LHC (9/15):  pLAD 50%, D1 (smaller br 90%/larger br 80%, pDx 50-60%, dLAD 50%, prox-mid CFX stent ok, pOM1 and OM2 50-70%  (jailed by stent), OM5 70-80%, mPDA 90% (CFX dsz unchanged from 2014), RCA diff dsz, EF 55%, mild ant-apical HK >>  Med Rx  . CVA (cerebral vascular accident) ([place])   . Dementia ([place])   . History of pneumonia   . Hx of echocardiogram    a. Echo 11/14): EF 55-60%, no RWMA, grade 2 DD, mild aortic stenosis (mean 15 mmHg), mild LAE  . Hypertension   . Lung nodule < 6cm on CT 02/20/2018  . MI (myocardial infarction) ([place]) 07/2013  . PAF (paroxysmal atrial fibrillation) (HCC)    hx of NSTEMI in setting of AF with RVR 07/2013;  Eliquis for AC  . Shingles    left chest  . Syncope   . TIA (transient ischemic attack) 07/12/2013    Past Surgical History:  Procedure Laterality Date  . CATARACT EXTRACTION Bilateral   . KYPHOPLASTY    . LEFT HEART CATHETERIZATION WITH CORONARY ANGIOGRAM N/A 08/08/2013   Procedure: LEFT HEART CATHETERIZATION WITH CORONARY ANGIOGRAM;  Surgeon: Jettie Booze, MD;  [facility] CATH LAB;  Service: Cardiovascular;  Laterality: N/A;  . LEFT HEART CATHETERIZATION WITH CORONARY ANGIOGRAM N/A 05/29/2014   Procedure: LEFT HEART CATHETERIZATION WITH CORONARY ANGIOGRAM;  Surgeon: Sinclair Grooms, MD;  Location: [facility] CATH LAB;  Service:  Cardiovascular;  Laterality: N/A;  . PERCUTANEOUS STENT INTERVENTION  08/08/2013   Procedure: PERCUTANEOUS STENT INTERVENTION;  Surgeon: Jettie Booze, MD;  Location: [facility] CATH LAB;  Service: Cardiovascular;;  . SHOULDER SURGERY Left   . TONSILLECTOMY      Family History  Problem Relation Age of Onset  . Diabetes Mother   . Diabetes Father   . Cancer - Lung Brother   . Gait disorder Brother   . Cancer Daughter   . Stroke Sister   . Heart attack Neg Hx     Social history:  reports that he has never smoked. He has never used smokeless tobacco. He reports current alcohol use of about 1.0 standard drinks of alcohol per week. He reports that he does not use drugs.    Allergies  Allergen Reactions  . Augmentin [Amoxicillin-Pot  Clavulanate] Rash  . Keppra [Levetiracetam]     Trouble with walking, fatigued  . Penicillins Rash    Has patient had a PCN reaction causing immediate rash, facial/tongue/throat swelling, SOB or lightheadedness with hypotension: Yes Has patient had a PCN reaction causing severe rash involving mucus membranes or skin necrosis: Unk Has patient had a PCN reaction that required hospitalization: Yes Has patient had a PCN reaction occurring within the last 10 years: No If all of the above answers are "NO", then may proceed with Cephalosporin use.   . Sulfa Antibiotics Rash    Medications:  Prior to Admission medications   Medication Sig Start Date End Date Taking? Authorizing Provider  amLODipine (NORVASC) 10 MG tablet Take 10 mg by mouth daily.   Yes [provider]  atorvastatin (LIPITOR) 40 MG tablet TAKE 1 TABLET ONCE DAILY AT 6 PM. 05/02/16  Yes Belva Crome, MD  cholecalciferol (VITAMIN D) 1000 UNITS tablet Take 1,000 Units by mouth daily.   Yes [provider]  donepezil (ARICEPT) 10 MG tablet TAKE ONE TABLET AT BEDTIME. 02/18/18  Yes Kathrynn Ducking, MD  donepezil (ARICEPT) 10 MG tablet TAKE ONE TABLET AT BEDTIME. 08/16/18  Yes Kathrynn Ducking, MD  ELIQUIS 5 MG TABS tablet TAKE 1 TABLET BY MOUTH TWICE DAILY. 03/30/19  Yes Imogene Burn, PA-C  isosorbide mononitrate (IMDUR) 30 MG 24 hr tablet Take 1 tablet (30 mg total) by mouth daily. Please make overdue annual appt with Dr. Tamala Julian for future refills. Thank you 01/18/19  Yes Belva Crome, MD  metoprolol tartrate (LOPRESSOR) 25 MG tablet Take 1 tablet (25 mg total) by mouth 2 (two) times daily. Please make overdue annual appt with Dr. Tamala Julian for future refills. Thank you 01/18/19  Yes Belva Crome, MD  Multiple Vitamins-Minerals (CENTRUM SILVER PO) Take 1 tablet by mouth daily.    Yes [provider]  nitroGLYCERIN (NITROSTAT) 0.4 MG SL tablet Place 1 tablet (0.4 mg total) under the tongue every 5 (five)  minutes as needed for chest pain. 12/22/17  Yes Imogene Burn, PA-C  omeprazole (PRILOSEC) 20 MG capsule Take 20 mg by mouth daily before breakfast.    Yes [provider]    ROS:  Out of a complete 14 system review of symptoms, the patient complains only of the following symptoms, and all other reviewed systems are negative.  Memory disturbance Near syncope Walking problems  Blood pressure 122/62, pulse (!) 53, temperature (!) 97.5 F (36.4 C), temperature source Temporal, weight 185 lb (83.9 kg), SpO2 94 %.  Physical Exam  General: The patient is alert and cooperative at the time  of the examination.  Skin: No significant peripheral edema is noted.   Neurologic Exam  Mental status: The patient is alert and oriented x 3 at the time of the examination.  The Mini-Mental Status Examination done today shows a total score 23/30.   Cranial nerves: Facial symmetry is present. Speech is normal, no aphasia or dysarthria is noted. Extraocular movements are full. Visual fields are full.  Mild masking of the face is seen.  Motor: The patient has good strength in all 4 extremities.  Sensory examination: Soft touch sensation is symmetric on the face, arms, and legs.  Coordination: The patient has good finger-nose-finger and heel-to-shin bilaterally.  Gait and station: The patient has a slightly stooped posture, he can walk independently.  There is decreased arm swing with the right with the right arm slightly flexed.  He does shuffle his feet slightly with turns.  Tandem gait was not attempted.  Romberg is negative.  Reflexes: Deep tendon reflexes are symmetric.   Assessment/Plan:  1.  Progressive memory disturbance  2.  Gait disturbance  The patient continues to decline with his cognitive abilities.  In the past he could not tolerate Namenda.  He is having some slowness of ambulation, he may be developing signs of parkinsonism.  The patient will follow-up in about 6  months.  We discussed the possibility of physical therapy but the son does not believe that the patient would allow for it.  Jill Alexanders MD 04/04/2019 10:21 AM  [facility] [address] [place], [place] [address]  Phone [phone] Fax [phone]

## 2019-04-06 DIAGNOSIS — Z9889 Other specified postprocedural states: Secondary | ICD-10-CM | POA: Diagnosis not present

## 2019-04-06 DIAGNOSIS — Z961 Presence of intraocular lens: Secondary | ICD-10-CM | POA: Diagnosis not present

## 2019-04-06 DIAGNOSIS — H40053 Ocular hypertension, bilateral: Secondary | ICD-10-CM | POA: Diagnosis not present

## 2019-04-28 ENCOUNTER — Other Ambulatory Visit: Payer: Self-pay | Admitting: Interventional Cardiology

## 2019-04-28 ENCOUNTER — Other Ambulatory Visit: Payer: Self-pay | Admitting: Neurology

## 2019-05-03 NOTE — Progress Notes (Signed)
Cardiology Office Note    Date:  05/04/2019   ID:  [name], [name] 1927/07/03, MRN [id number]  PCP:  Seward Carol, MD  Cardiologist: Sinclair Grooms, MD EPS: None  Chief Complaint  Patient presents with  . Follow-up    History of Present Illness:  Derek [name] is a 85 y.o. male with a history of CAD s/p BMS to CFx (2014), PAF on Eliquis, embolic CVA, OSA non complaint with CPAP, mild AS, CKD stage II-III dementia and previous syncope .  Patient has had progressive dementia and was having some confusion over Eliquis dose.High risk NST 2015.last cath 2015 widely patent proximal LAD and circumflex.  Circumflex stent patent, high grade disease in the first diagonal, fifth OM and PDA accounting for the high risk abnormality on NST.  Diffuse disease in nondominant RCA, preserved overall LV function.  Medical therapy recommended.   I saw the patient for chest pain cough and wheezing 12/2017 at which time I got an x-ray and he had pneumonia which I treated.  He had also had a syncopal episode and Holter monitor showed normal sinus rhythm with PACs but no excessively slow rates, atrial fib or other arrhythmias to call syncope.  2D echo 12/2017 showed mild to moderate aortic stenosis with no significant change from 2 years prior.  Patient was seen by neurology 7/27 with progressive decline in cognitive ability and ambulation.  Son says he is coughing up white phlegm and seems congested. Short of breath with activity. Has episodes of getting hot and sweaty and passes out. Son can get him to lay down most of the times. Occurs when he bends over. Has been going on for 4 yrs. No complaints of chest pain or palpitations, leg edema.       Past Medical History:  Diagnosis Date  . Broken shoulder   . Cerebrovascular disease    MRI/MRA in 2007 - 50% stenosis of supraclinoid ICA, left  . Clavicle fracture   . Closed wedge compression fracture of T1 vertebra ([place]) 02/20/2018  . Complex  sleep apnea syndrome    adapt SV titration EEP 6 min 6 and max 15 cm water.   . Compression fracture   . Coronary atherosclerosis of native coronary artery 07/12/2013   a. NSTEMI in setting of AF with RVR 11/14 >> BMS to CFX;  b. Nuclear (9/15):  High risk - apical lateral ischemia >> c. LHC (9/15):  pLAD 50%, D1 (smaller br 90%/larger br 80%, pDx 50-60%, dLAD 50%, prox-mid CFX stent ok, pOM1 and OM2 50-70% (jailed by stent), OM5 70-80%, mPDA 90% (CFX dsz unchanged from 2014), RCA diff dsz, EF 55%, mild ant-apical HK >>  Med Rx  . CVA (cerebral vascular accident) ([place])   . Dementia ([place])   . History of pneumonia   . Hx of echocardiogram    a. Echo 11/14): EF 55-60%, no RWMA, grade 2 DD, mild aortic stenosis (mean 15 mmHg), mild LAE  . Hypertension   . Lung nodule < 6cm on CT 02/20/2018  . MI (myocardial infarction) ([place]) 07/2013  . PAF (paroxysmal atrial fibrillation) (HCC)    hx of NSTEMI in setting of AF with RVR 07/2013;  Eliquis for AC  . Shingles    left chest  . Syncope   . TIA (transient ischemic attack) 07/12/2013    Past Surgical History:  Procedure Laterality Date  . CATARACT EXTRACTION Bilateral   . KYPHOPLASTY    . LEFT HEART CATHETERIZATION WITH  CORONARY ANGIOGRAM N/A 08/08/2013   Procedure: LEFT HEART CATHETERIZATION WITH CORONARY ANGIOGRAM;  Surgeon: Jettie Booze, MD;  Location[facility] C[facility] LAB;  Service: Cardiovascular;  Laterality: N/A;  . LEFT HEART CATHETERIZATION WITH CORONARY ANGIOGRAM N/A 05/29/2014   Procedure: LEFT HEART CATHETERIZATION WITH CORONARY ANGIOGRAM;  Surgeon: Sinclair Grooms, MD;  Location: [facility] CATH LAB;  Service: Cardiovascular;  Laterality: N/A;  . PERCUTANEOUS STENT INTERVENTION  08/08/2013   Procedure: PERCUTANEOUS STENT INTERVENTION;  Surgeon: Jettie Booze, MD;  Location: [facility] CATH LAB;  Service: Cardiovascular;;  . SHOULDER SURGERY Left   . TONSILLECTOMY      Current Medications: Current Meds  Medication Sig  . amLODipine (NORVASC) 10  MG tablet Take 1 tablet (10 mg total) by mouth daily.  Marland Kitchen atorvastatin (LIPITOR) 40 MG tablet TAKE 1 TABLET ONCE DAILY AT 6 PM.  . cholecalciferol (VITAMIN D) 1000 UNITS tablet Take 1,000 Units by mouth daily.  Marland Kitchen donepezil (ARICEPT) 10 MG tablet TAKE ONE TABLET AT BEDTIME.  Marland Kitchen ELIQUIS 5 MG TABS tablet TAKE 1 TABLET BY MOUTH TWICE DAILY.  . isosorbide mononitrate (IMDUR) 30 MG 24 hr tablet Take 1 tablet (30 mg total) by mouth daily. Patient must keep upcoming appointment for further refills  . Multiple Vitamins-Minerals (CENTRUM SILVER PO) Take 1 tablet by mouth daily.   . nitroGLYCERIN (NITROSTAT) 0.4 MG SL tablet Place 1 tablet (0.4 mg total) under the tongue every 5 (five) minutes as needed for chest pain.  Marland Kitchen omeprazole (PRILOSEC) 20 MG capsule Take 20 mg by mouth daily before breakfast.   . [DISCONTINUED] amLODipine (NORVASC) 10 MG tablet Take 10 mg by mouth daily.  . [DISCONTINUED] atorvastatin (LIPITOR) 40 MG tablet TAKE 1 TABLET ONCE DAILY AT 6 PM.  . [DISCONTINUED] isosorbide mononitrate (IMDUR) 30 MG 24 hr tablet Take 1 tablet (30 mg total) by mouth daily. Patient must keep upcoming appointment for further refills  . [DISCONTINUED] metoprolol tartrate (LOPRESSOR) 25 MG tablet Take 1 tablet (25 mg total) by mouth 2 (two) times daily. Patient must keep upcoming appointment for further refills  . [DISCONTINUED] metoprolol tartrate (LOPRESSOR) 25 MG tablet Take 1 tablet (25 mg total) by mouth 2 (two) times daily. Patient must keep upcoming appointment for further refills  . [DISCONTINUED] nitroGLYCERIN (NITROSTAT) 0.4 MG SL tablet Place 1 tablet (0.4 mg total) under the tongue every 5 (five) minutes as needed for chest pain.     Allergies:   Augmentin [amoxicillin-pot clavulanate], Keppra [levetiracetam], Penicillins, and Sulfa antibiotics   Social History   Socioeconomic History  . Marital status: Widowed    Spouse name: Not on file  . Number of children: 2  . Years of education: Engineer, maintenance (IT)  .  Highest education level: Not on file  Occupational History  . Occupation: retired  Scientific laboratory technician  . Financial resource strain: Not on file  . Food insecurity    Worry: Not on file    Inability: Not on file  . Transportation needs    Medical: Not on file    Non-medical: Not on file  Tobacco Use  . Smoking status: Never Smoker  . Smokeless tobacco: Never Used  Substance and Sexual Activity  . Alcohol use: Yes    Alcohol/week: 1.0 standard drinks    Types: 1 Glasses of wine per week    Comment: occasionally  . Drug use: No  . Sexual activity: Not on file  Lifestyle  . Physical activity    Days per week: Not on file  Minutes per session: Not on file  . Stress: Not on file  Relationships  . Social Herbalist on phone: Not on file    Gets together: Not on file    Attends religious service: Not on file    Active member of club or organization: Not on file    Attends meetings of clubs or organizations: Not on file    Relationship status: Not on file  Other Topics Concern  . Not on file  Social History Narrative   Patient is widowed [name]), has 2 children   Patient is right handed   Education level is 4 year degree.   Caffeine consumption is 2-4 cups daily     Family History:  The patient's   family history includes Cancer in his daughter; Cancer - Lung in his brother; Diabetes in his father and mother; Gait disorder in his brother; Stroke in his sister.   ROS:   Please see the history of present illness.    ROS All other systems reviewed and are negative.   PHYSICAL EXAM:   VS:  BP 136/72   Pulse (!) 51   Ht 5\' 7"  (1.702 m)   Wt 181 lb 9.6 oz (82.4 kg)   SpO2 96%   BMI 28.44 kg/m   Physical Exam  GEN: Elderly,Well nourished, well developed, in no acute distress  Neck: no JVD, carotid bruits, or masses Cardiac:RRR; no murmurs, rubs, or gallops  Respiratory: decreased breath sounds with some congestion GI: soft, nontender, nondistended, + BS Ext:  without cyanosis, clubbing, or edema, Good distal pulses bilaterally Neuro:  Alert and Oriented x 3 Psych: euthymic mood, full affect  Wt Readings from Last 3 Encounters:  05/04/19 181 lb 9.6 oz (82.4 kg)  04/04/19 185 lb (83.9 kg)  04/05/18 188 lb (85.3 kg)      Studies/Labs Reviewed:   EKG:  EKG is  ordered today.  The ekg ordered today demonstrates sinus bradycardia at 51 bpm patient has not had his metoprolol yet today  Recent Labs: No results found for requested labs within last 8760 hours.   Lipid Panel    Component Value Date/Time   CHOL 100 12/27/2014 0916   TRIG 67.0 12/27/2014 0916   HDL 29.50 (L) 12/27/2014 0916   CHOLHDL 3 12/27/2014 0916   VLDL 13.4 12/27/2014 0916   LDLCALC 57 12/27/2014 0916    Additional studies/ records that were reviewed today include:     Holter monitor 4/22/2019Normal sinus rhythm  PACs accounting for up to 6% of cardiac activity.  Rare PVCs  No atrial fibrillation or ventricular tachycardia noted  No excessive bradycardia.   Normal sinus rhythm PACs Rare PVCs No atrial fib, excessive bradycardia, or significant ventricular ectopy.   HEART RATE EPISODES Minimum HR: 45 BPM at 12:22:21 PM Maximum HR: 111 BPM at 7:52:44 AM Average HR: 61 BPM     Chest x-ray 4/22/2019IMPRESSION: Right infrahilar opacity, likely pneumonia.   These results will be called to the ordering clinician or representative by the Radiologist Assistant, and communication documented in the PACS or zVision Dashboard.    Echo 4/19/19Study Conclusions   - Left ventricle: The cavity size was normal. Wall thickness was   increased in a pattern of mild LVH. Systolic function was normal.   The estimated ejection fraction was in the range of 55% to 60%.   Left ventricular diastolic function parameters were normal. - Aortic valve: Gradient similar to 12/2015 when mean gradient 18  mmHg and peak 28 mmHg. There was mild to moderate stenosis. Valve   area  (VTI): 1.29 cm^2. Valve area (Vmax): 1.23 cm^2. Valve area   (Vmean): 1.21 cm^2. - Mitral valve: Calcified annulus. Moderately thickened, moderately   calcified leaflets . Valve area by pressure half-time: 2.24 cm^2. - Left atrium: The atrium was mildly dilated. - Atrial septum: No defect or patent foramen ovale was identified.         ASSESSMENT:    1. Syncope, unspecified syncope type   2. Paroxysmal atrial fibrillation (HCC)   3. History of CVA (cerebrovascular accident)   4. Essential hypertension   5. Mixed hyperlipidemia   6. Aortic valve stenosis, etiology of cardiac valve disease unspecified   7. Congestion of respiratory tract      PLAN:  In order of problems listed above:     History of Syncope   Holter monitor did not show any A. fib or significant arrhythmia.  2D echo 12/2017 normal LV EF with mild to moderate aortic stenosis it has not progressed since 2017.  Patient continues to have syncopal episodes if he bends over but his son usually can get him to lay down and then he does not pass out.  He says it is unchanged for the past 4 years.  He is bradycardic at 51 bpm and has not had his metoprolol yet today.  I will decrease metoprolol to 12.5 mg twice daily and some will start checking his heart rate and blood pressure more closely at home.  I offered a 2D echo to reassess aortic stenosis but with his recent decline physically and mentally they would like to hold off for now which I agree with.  Follow-up with Dr. Tamala Julian in 6 months.  CAD status post BMS to the circumflex 2014, last cath widely patent proximal LAD and circumflex.  Patent circumflex stent, high-grade disease in the first diagonal, fifth OM and PDA accounting for high risk abnormality on NST.  Diffuse disease in the nondominant RCA and preserved LV function.  Medical therapy recommended.   No angina   PAF on Eliquis 5 mg twice daily no A. fib on 12/2017 Holter.  In sinus bradycardia today.  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  Unchanged since 2017.   Chest congestion patient has a very loose cough.  Lungs overall sound pretty clear and he has no heart failure on exam.  He did have pneumonia last year when I saw him.  Will order chest x-ray to rule out pneumonia.  Also checking CBC.    Medication Adjustments/Labs and Tests Ordered: Current medicines are reviewed at length with the patient today.  Concerns regarding medicines are outlined above.  Medication changes, Labs and Tests ordered today are listed in the Patient Instructions below. Patient Instructions  Medication Instructions:  Your physician recommends that you continue on your current medications as directed. Please refer to the Current Medication list given to you today.  If you need a refill on your cardiac medications before your next appointment, please call your pharmacy.   Lab work:Your physician recommends that you return for lab work in: TODAY:  CMET CBC TSH  If you have labs (blood work) drawn today and your tests are completely normal, you will  receive your results only by: Marland Kitchen MyChart Message (if you have MyChart) OR . A paper copy in the mail If you have any lab test that is abnormal or we need to change your treatment, we will call you to review the results.  Testing/Procedures: A chest x-ray takes a picture of the organs and structures inside the chest, including the heart, lungs, and blood vessels. This test can show several things, including, whether the heart is enlarges; whether fluid is building up in the lungs; and whether pacemaker / defibrillator leads are still in place.  Chest X-ray Instructions:    1. You may have this done at the [facility], located in the         [place] on the 1st floor.    2. You do no have to have an appointment.    3. [place], [place] [address]        [phone]        Monday - Friday  8:00 am - 5:00 pm  Follow-Up: Your physician wants you to follow-up in: 6 months. You will receive a reminder letter in the mail two months in advance. If you don't receive a letter, please call our office to schedule the follow-up appointment.    Any Other Special Instructions Will Be Listed Below (If Applicable).        Signed, Ermalinda Barrios, PA-C  05/04/2019 1:46 PM    [facility] [place], [place], [place]  [address] Phone: [phone]; Fax: [phone]

## 2019-05-04 ENCOUNTER — Ambulatory Visit
Admission: RE | Admit: 2019-05-04 | Discharge: 2019-05-04 | Disposition: A | Discharge status: Home / Self Care | Payer: Medicare Other | Source: Ambulatory Visit | Attending: Physician Assistant | Admitting: Physician Assistant

## 2019-05-04 ENCOUNTER — Ambulatory Visit (INDEPENDENT_AMBULATORY_CARE_PROVIDER_SITE_OTHER): Payer: Medicare Other | Admitting: Physician Assistant

## 2019-05-04 ENCOUNTER — Encounter: Payer: Self-pay | Admitting: Physician Assistant

## 2019-05-04 ENCOUNTER — Other Ambulatory Visit: Payer: Self-pay

## 2019-05-04 VITALS — BP 136/72 | HR 51 | Ht 67.0 in | Wt 181.6 lb

## 2019-05-04 DIAGNOSIS — J988 Other specified respiratory disorders: Secondary | ICD-10-CM

## 2019-05-04 DIAGNOSIS — I350 Nonrheumatic aortic (valve) stenosis: Secondary | ICD-10-CM | POA: Diagnosis not present

## 2019-05-04 DIAGNOSIS — I480 Paroxysmal atrial fibrillation: Secondary | ICD-10-CM

## 2019-05-04 DIAGNOSIS — I10 Essential (primary) hypertension: Secondary | ICD-10-CM | POA: Diagnosis not present

## 2019-05-04 DIAGNOSIS — R55 Syncope and collapse: Secondary | ICD-10-CM

## 2019-05-04 DIAGNOSIS — Z8673 Personal history of transient ischemic attack (TIA), and cerebral infarction without residual deficits: Secondary | ICD-10-CM | POA: Diagnosis not present

## 2019-05-04 DIAGNOSIS — R05 Cough: Secondary | ICD-10-CM | POA: Diagnosis not present

## 2019-05-04 DIAGNOSIS — E782 Mixed hyperlipidemia: Secondary | ICD-10-CM

## 2019-05-04 MED ORDER — NITROGLYCERIN 0.4 MG SL SUBL: 0.4000 mg | SUBLINGUAL_TABLET | SUBLINGUAL | 2 refills | Status: DC | PRN

## 2019-05-04 MED ORDER — METOPROLOL TARTRATE 25 MG PO TABS: 25.0000 mg | ORAL_TABLET | Freq: Two times a day (BID) | ORAL | 3 refills | Status: DC

## 2019-05-04 MED ORDER — AMLODIPINE BESYLATE 10 MG PO TABS: 10.0000 mg | ORAL_TABLET | Freq: Every day | ORAL | 3 refills | Status: DC

## 2019-05-04 MED ORDER — ATORVASTATIN CALCIUM 40 MG PO TABS: ORAL_TABLET | ORAL | 3 refills | Status: DC

## 2019-05-04 MED ORDER — ISOSORBIDE MONONITRATE ER 30 MG PO TB24: 30.0000 mg | ORAL_TABLET | Freq: Every day | ORAL | 3 refills | Status: DC

## 2019-05-04 MED ORDER — METOPROLOL TARTRATE 25 MG PO TABS: ORAL_TABLET | ORAL | 3 refills | Status: DC

## 2019-05-04 NOTE — Patient Instructions (Addendum)
Medication Instructions:  Your physician recommends that you continue on your current medications as directed. Please refer to the Current Medication list given to you today.  If you need a refill on your cardiac medications before your next appointment, please call your pharmacy.   Lab work:Your physician recommends that you return for lab work in: TODAY:  CMET CBC TSH  If you have labs (blood work) drawn today and your tests are completely normal, you will receive your results only by: Marland Kitchen MyChart Message (if you have MyChart) OR . A paper copy in the mail If you have any lab test that is abnormal or we need to change your treatment, we will call you to review the results.  Testing/Procedures: A chest x-ray takes a picture of the organs and structures inside the chest, including the heart, lungs, and blood vessels. This test can show several things, including, whether the heart is enlarges; whether fluid is building up in the lungs; and whether pacemaker / defibrillator leads are still in place.  Chest X-ray Instructions:    1. You may have this done at the [facility], located in the        [place] on the 1st floor.    2. You do no have to have an appointment.    3. [place], [place] [address]        [phone]        Monday - Friday  8:00 am - 5:00 pm  Follow-Up: Your physician wants you to follow-up in: 6 months. You will receive a reminder letter in the mail two months in advance. If you don't receive a letter, please call our office to schedule the follow-up appointment.    Any Other Special Instructions Will Be Listed Below (If Applicable).

## 2019-05-05 ENCOUNTER — Other Ambulatory Visit: Payer: Self-pay

## 2019-05-05 ENCOUNTER — Telehealth: Payer: Self-pay | Admitting: Physician Assistant

## 2019-05-05 ENCOUNTER — Other Ambulatory Visit: Payer: Self-pay | Admitting: Physician Assistant

## 2019-05-05 DIAGNOSIS — N183 Chronic kidney disease, stage 3 (moderate): Secondary | ICD-10-CM | POA: Diagnosis not present

## 2019-05-05 DIAGNOSIS — I639 Cerebral infarction, unspecified: Secondary | ICD-10-CM | POA: Diagnosis not present

## 2019-05-05 DIAGNOSIS — G459 Transient cerebral ischemic attack, unspecified: Secondary | ICD-10-CM | POA: Diagnosis not present

## 2019-05-05 DIAGNOSIS — I10 Essential (primary) hypertension: Secondary | ICD-10-CM | POA: Diagnosis not present

## 2019-05-05 DIAGNOSIS — I2510 Atherosclerotic heart disease of native coronary artery without angina pectoris: Secondary | ICD-10-CM | POA: Diagnosis not present

## 2019-05-05 DIAGNOSIS — F322 Major depressive disorder, single episode, severe without psychotic features: Secondary | ICD-10-CM | POA: Diagnosis not present

## 2019-05-05 DIAGNOSIS — N400 Enlarged prostate without lower urinary tract symptoms: Secondary | ICD-10-CM | POA: Diagnosis not present

## 2019-05-05 DIAGNOSIS — I480 Paroxysmal atrial fibrillation: Secondary | ICD-10-CM | POA: Diagnosis not present

## 2019-05-05 DIAGNOSIS — F0390 Unspecified dementia without behavioral disturbance: Secondary | ICD-10-CM | POA: Diagnosis not present

## 2019-05-05 DIAGNOSIS — E7800 Pure hypercholesterolemia, unspecified: Secondary | ICD-10-CM | POA: Diagnosis not present

## 2019-05-05 DIAGNOSIS — E785 Hyperlipidemia, unspecified: Secondary | ICD-10-CM | POA: Diagnosis not present

## 2019-05-05 LAB — COMPREHENSIVE METABOLIC PANEL
ALT: 22 IU/L (ref 0–44)
AST: 22 IU/L (ref 0–40)
Albumin/Globulin Ratio: 2 (ref 1.2–2.2)
Albumin: 4.5 g/dL (ref 3.5–4.6)
Alkaline Phosphatase: 102 IU/L (ref 39–117)
BUN/Creatinine Ratio: 15 (ref 10–24)
BUN: 26 mg/dL (ref 10–36)
Bilirubin Total: 0.5 mg/dL (ref 0.0–1.2)
CO2: 24 mmol/L (ref 20–29)
Calcium: 9.7 mg/dL (ref 8.6–10.2)
Chloride: 102 mmol/L (ref 96–106)
Creatinine, Ser: 1.7 mg/dL — ABNORMAL HIGH (ref 0.76–1.27)
GFR calc Af Amer: 40 mL/min/{1.73_m2} — ABNORMAL LOW (ref >59)
GFR calc non Af Amer: 35 mL/min/{1.73_m2} — ABNORMAL LOW (ref >59)
Globulin, Total: 2.2 g/dL (ref 1.5–4.5)
Glucose: 97 mg/dL (ref 65–99)
Potassium: 5 mmol/L (ref 3.5–5.2)
Sodium: 144 mmol/L (ref 134–144)
Total Protein: 6.7 g/dL (ref 6.0–8.5)

## 2019-05-05 LAB — CBC
Hematocrit: 48.3 % (ref 37.5–51.0)
Hemoglobin: 15.8 g/dL (ref 13.0–17.7)
MCH: 29.6 pg (ref 26.6–33.0)
MCHC: 32.7 g/dL (ref 31.5–35.7)
MCV: 91 fL (ref 79–97)
Platelets: 212 10*3/uL (ref 150–450)
RBC: 5.33 x10E6/uL (ref 4.14–5.80)
RDW: 13.7 % (ref 11.6–15.4)
WBC: 8.2 10*3/uL (ref 3.4–10.8)

## 2019-05-05 LAB — TSH: TSH: 0.956 u[IU]/mL (ref 0.450–4.500)

## 2019-05-05 MED ORDER — METOPROLOL TARTRATE 25 MG PO TABS: ORAL_TABLET | ORAL | 3 refills | Status: DC

## 2019-05-05 MED ORDER — APIXABAN 5 MG PO TABS: 5.0000 mg | ORAL_TABLET | Freq: Two times a day (BID) | ORAL | 0 refills | Status: DC

## 2019-05-05 NOTE — Telephone Encounter (Signed)
New Message   Patient is calling in for the results of the X-Ray. Please give patient a call back.

## 2019-05-05 NOTE — Telephone Encounter (Signed)
37m 82.4kg Scr 1.7 05/04/19 Lovw/lenze 05/04/19

## 2019-05-05 NOTE — Telephone Encounter (Signed)
The patient has been notified of the result and verbalized understanding.  All questions (if any) were answered. Wilma Flavin, RN 05/05/2019 4:11 PM

## 2019-05-06 ENCOUNTER — Telehealth: Payer: Self-pay | Admitting: *Deleted

## 2019-05-06 ENCOUNTER — Other Ambulatory Visit: Payer: Self-pay | Admitting: *Deleted

## 2019-05-06 ENCOUNTER — Other Ambulatory Visit: Payer: Self-pay

## 2019-05-06 DIAGNOSIS — Z79899 Other long term (current) drug therapy: Secondary | ICD-10-CM

## 2019-05-06 MED ORDER — APIXABAN 2.5 MG PO TABS: 2.5000 mg | ORAL_TABLET | Freq: Two times a day (BID) | ORAL | 5 refills | Status: DC

## 2019-05-06 NOTE — Telephone Encounter (Signed)
[place] Visit Initial Request  Date of Request (Derek [place]):  May 06, 2019  Requesting Provider:  Ermalinda Barrios, PA-C    Agency Requested:    [facility] Contact:  Derek Buff, NP [address] [place], [place] [address] Phone #:  [phone] Fax #:  [phone]  Patient Demographic Information: Name:  Derek [name] Age:  85 y.o.   DOB:  1927/07/11  MRN:  [id number]   Address:   [place] [id number]   Phone Numbers:   Home Phone [phone]  Mobile [phone]     Emergency Contact Information on File:   Contact Information    Name Relation Home Work Mobile   Derek [name] Daughter [phone]  [phone]   Derek [name] [phone]     Derek [name]   [phone]      The above family members may be contacted for information on this patient (review DPR on file):  Yes    Patient Clinical Information:  Primary Care Provider:  Seward Carol, MD  Primary Cardiologist:  Derek Grooms, MD  Primary Electrophysiologist:  None   Past Medical Hx: Derek [name]  has a past medical history of Broken shoulder, Cerebrovascular disease, Clavicle fracture, Closed wedge compression fracture of T1 vertebra ([facility]) (02/20/2018), Complex sleep apnea syndrome, Compression fracture, Coronary atherosclerosis of native coronary artery (07/12/2013), CVA (cerebral vascular accident) ([facility]), Dementia ([place]), History of pneumonia, echocardiogram, Hypertension, Lung nodule < 6cm on CT (02/20/2018), MI (myocardial infarction) ([facility]) (07/2013), PAF (paroxysmal atrial fibrillation) ([facility]), Shingles, Syncope, and TIA (transient ischemic attack) (07/12/2013).   Allergies: He is allergic to augmentin [amoxicillin-pot clavulanate]; keppra [levetiracetam]; penicillins; and sulfa antibiotics.   Medications: Current Outpatient Medications on File Prior to Visit  Medication Sig  . amLODipine (NORVASC) 10 MG tablet  Take 1 tablet (10 mg total) by mouth daily.  Marland Kitchen apixaban (ELIQUIS) 2.5 MG TABS tablet Take 1 tablet (2.5 mg total) by mouth 2 (two) times daily.  Marland Kitchen atorvastatin (LIPITOR) 40 MG tablet TAKE 1 TABLET ONCE DAILY AT 6 PM.  . cholecalciferol (VITAMIN D) 1000 UNITS tablet Take 1,000 Units by mouth daily.  Marland Kitchen donepezil (ARICEPT) 10 MG tablet TAKE ONE TABLET AT BEDTIME.  . isosorbide mononitrate (IMDUR) 30 MG 24 hr tablet Take 1 tablet (30 mg total) by mouth daily. Patient must keep upcoming appointment for further refills  . metoprolol tartrate (LOPRESSOR) 25 MG tablet Take 1/2 tablet (12.5 mg) twice daily  . Multiple Vitamins-Minerals (CENTRUM SILVER PO) Take 1 tablet by mouth daily.   . nitroGLYCERIN (NITROSTAT) 0.4 MG SL tablet Place 1 tablet (0.4 mg total) under the tongue every 5 (five) minutes as needed for chest pain.  Marland Kitchen omeprazole (PRILOSEC) 20 MG capsule Take 20 mg by mouth daily before breakfast.    No current facility-administered medications on file prior to visit.      Social Hx: He  reports that he has never smoked. He has never used smokeless tobacco. He reports current alcohol use of about 1.0 standard drinks of alcohol per week. He reports that he does not use drugs.    Diagnosis/Reason for Visit:   Labs--Elevated Cr  Services Requested:  Vital Signs (BP, Pulse, O2, Weight)  Physical Exam  Labs:  BMET  # of Visits Needed/Frequency per Week: 1 in 1 month

## 2019-05-06 NOTE — Addendum Note (Signed)
Addended by: Gaetano Net on: 05/06/2019 02:46 PM   Modules accepted: Orders

## 2019-05-17 DIAGNOSIS — I10 Essential (primary) hypertension: Secondary | ICD-10-CM | POA: Diagnosis not present

## 2019-05-17 DIAGNOSIS — I480 Paroxysmal atrial fibrillation: Secondary | ICD-10-CM | POA: Diagnosis not present

## 2019-05-17 DIAGNOSIS — N400 Enlarged prostate without lower urinary tract symptoms: Secondary | ICD-10-CM | POA: Diagnosis not present

## 2019-05-17 DIAGNOSIS — N183 Chronic kidney disease, stage 3 (moderate): Secondary | ICD-10-CM | POA: Diagnosis not present

## 2019-05-17 DIAGNOSIS — I2510 Atherosclerotic heart disease of native coronary artery without angina pectoris: Secondary | ICD-10-CM | POA: Diagnosis not present

## 2019-05-17 DIAGNOSIS — F322 Major depressive disorder, single episode, severe without psychotic features: Secondary | ICD-10-CM | POA: Diagnosis not present

## 2019-05-17 DIAGNOSIS — F0390 Unspecified dementia without behavioral disturbance: Secondary | ICD-10-CM | POA: Diagnosis not present

## 2019-05-17 DIAGNOSIS — I639 Cerebral infarction, unspecified: Secondary | ICD-10-CM | POA: Diagnosis not present

## 2019-05-17 DIAGNOSIS — G459 Transient cerebral ischemic attack, unspecified: Secondary | ICD-10-CM | POA: Diagnosis not present

## 2019-05-17 DIAGNOSIS — E7800 Pure hypercholesterolemia, unspecified: Secondary | ICD-10-CM | POA: Diagnosis not present

## 2019-05-17 DIAGNOSIS — E785 Hyperlipidemia, unspecified: Secondary | ICD-10-CM | POA: Diagnosis not present

## 2019-06-01 ENCOUNTER — Other Ambulatory Visit: Payer: Self-pay | Admitting: Physician Assistant

## 2019-06-01 ENCOUNTER — Other Ambulatory Visit: Payer: Self-pay | Admitting: Neurology

## 2019-06-01 DIAGNOSIS — Z79899 Other long term (current) drug therapy: Secondary | ICD-10-CM | POA: Diagnosis not present

## 2019-06-02 LAB — BASIC METABOLIC PANEL
BUN/Creatinine Ratio: 22 (ref 10–24)
BUN: 31 mg/dL (ref 10–36)
CO2: 21 mmol/L (ref 20–29)
Calcium: 10 mg/dL (ref 8.6–10.2)
Chloride: 106 mmol/L (ref 96–106)
Creatinine, Ser: 1.44 mg/dL — ABNORMAL HIGH (ref 0.76–1.27)
GFR calc Af Amer: 49 mL/min/{1.73_m2} — ABNORMAL LOW (ref >59)
GFR calc non Af Amer: 42 mL/min/{1.73_m2} — ABNORMAL LOW (ref >59)
Glucose: 77 mg/dL (ref 65–99)
Potassium: 4.2 mmol/L (ref 3.5–5.2)
Sodium: 143 mmol/L (ref 134–144)

## 2019-06-02 NOTE — Progress Notes (Signed)
Home Visit done on 06/01/19 on behalf of [facility] for exam, VS, Wt, BMET. Pt lives with his grown son, but his daughter was there for the visit as well.  He had no acute complaints and stated he was doing well overall.  He did say that he "stayed up all night" because he didn't want to miss this home visit appointment.   He did c/o urinary incontinence, which isn't new as well as a rare episode of stool incontinence when he "falls out" at the table. He and his son describe it as his BP possibly getting too low and he drops his head and will have stool incontinence.  They report that Mr. Derek [name]'s provider is aware.   The son states his father's pulse is usually always in the 50-60's range, so it was high today.  The daughter is attributing this to him not having had a good night's rest.  His pulse is irregular and counted at 86 over 1 minute.   He also c/o flaky skin on his face, which is also not new, and doesn't itch or bother him otherwise.   BMP was drawn and brought to [facility] on [address].

## 2019-06-07 ENCOUNTER — Telehealth: Payer: Self-pay | Admitting: *Deleted

## 2019-06-07 DIAGNOSIS — I10 Essential (primary) hypertension: Secondary | ICD-10-CM

## 2019-06-07 NOTE — Telephone Encounter (Signed)
[facility] Visit Follow Up Request   Date of Request ([place]):  June 07, 2019  Requesting Provider:  Ermalinda Barrios, [facility]    Agency Requested:    [facility] Contact:  Glory Buff, NP [address] [place], [place] [address] Phone #:  [phone] Fax #:  [phone]  Patient Demographic Information: Name:  Derek [name] Age:  85 y.o.   DOB:  1927-07-11  MRN:  [id number]   Home visit progress note(s), lab results, telemetry strips, etc were reviewed.  Provider Recommendations: FOLLOW UP REQUEST, HTN , MED CHANGE  Follow up home services requested:  Labs:  BMET  WHEN SERVICES ARE NEEDED: 1 MONTH  # OF VISITS/FREQUENCY REQUESTED: 1 VISIT

## 2019-07-06 NOTE — Progress Notes (Signed)
Called to make appointment for a home visit on behalf of [facility].  No answer at the home number listed and no VM to leave message.  Called daughter's number that was listed.  Daughter stated they're unable to have it done today (Monday 10/26) but next week Monday (11/2)  would work better for them.  [facility] made aware.

## 2019-07-11 DIAGNOSIS — I10 Essential (primary) hypertension: Secondary | ICD-10-CM | POA: Diagnosis not present

## 2019-07-11 NOTE — Progress Notes (Signed)
Home visit on behalf of [facility] for exam, VS, BMET.     Derek [name] had no complaints other than occasionally having urinary incontinence.    His daughter had come over to bring breakfast of oatmeal and coffee and Derek [name] had just taken AM medications.  Derek [name] lives with his son who stated he decreased Derek [name]'s Eliquis from 5mg  BID to 5mg  Qam & 2.5mg  QPM x 1 week and 2.5mg  BID x 1 week and continues to take 2.5mg  BID.                                                                                                           Derek [name] metoprolol has not been decreased to 12.5mg  BID.  Derek [name] son states he's still taking metoprolol 25mg  BID since he hasn't had any "falling-out" episodes since the last visit and because his BP has been "staying around the same as it is today."  BMET was drawn and brought to the [facility] on [address].

## 2019-07-12 LAB — BASIC METABOLIC PANEL
BUN/Creatinine Ratio: 17 (ref 10–24)
BUN: 24 mg/dL (ref 10–36)
CO2: 24 mmol/L (ref 20–29)
Calcium: 9.9 mg/dL (ref 8.6–10.2)
Chloride: 107 mmol/L — ABNORMAL HIGH (ref 96–106)
Creatinine, Ser: 1.43 mg/dL — ABNORMAL HIGH (ref 0.76–1.27)
GFR calc Af Amer: 49 mL/min/{1.73_m2} — ABNORMAL LOW (ref >59)
GFR calc non Af Amer: 43 mL/min/{1.73_m2} — ABNORMAL LOW (ref >59)
Glucose: 94 mg/dL (ref 65–99)
Potassium: 4.9 mmol/L (ref 3.5–5.2)
Sodium: 144 mmol/L (ref 134–144)

## 2019-07-19 DIAGNOSIS — I10 Essential (primary) hypertension: Secondary | ICD-10-CM | POA: Diagnosis not present

## 2019-07-19 DIAGNOSIS — Z23 Encounter for immunization: Secondary | ICD-10-CM | POA: Diagnosis not present

## 2019-07-19 DIAGNOSIS — N1831 Chronic kidney disease, stage 3a: Secondary | ICD-10-CM | POA: Diagnosis not present

## 2019-07-19 DIAGNOSIS — F322 Major depressive disorder, single episode, severe without psychotic features: Secondary | ICD-10-CM | POA: Diagnosis not present

## 2019-07-19 DIAGNOSIS — I639 Cerebral infarction, unspecified: Secondary | ICD-10-CM | POA: Diagnosis not present

## 2019-07-19 DIAGNOSIS — F0390 Unspecified dementia without behavioral disturbance: Secondary | ICD-10-CM | POA: Diagnosis not present

## 2019-07-19 DIAGNOSIS — I480 Paroxysmal atrial fibrillation: Secondary | ICD-10-CM | POA: Diagnosis not present

## 2019-07-19 DIAGNOSIS — Z1389 Encounter for screening for other disorder: Secondary | ICD-10-CM | POA: Diagnosis not present

## 2019-07-19 DIAGNOSIS — Z0000 Encounter for general adult medical examination without abnormal findings: Secondary | ICD-10-CM | POA: Diagnosis not present

## 2019-07-19 DIAGNOSIS — I2510 Atherosclerotic heart disease of native coronary artery without angina pectoris: Secondary | ICD-10-CM | POA: Diagnosis not present

## 2019-07-19 DIAGNOSIS — I350 Nonrheumatic aortic (valve) stenosis: Secondary | ICD-10-CM | POA: Diagnosis not present

## 2019-07-19 DIAGNOSIS — E7800 Pure hypercholesterolemia, unspecified: Secondary | ICD-10-CM | POA: Diagnosis not present

## 2019-09-19 ENCOUNTER — Other Ambulatory Visit: Payer: Self-pay

## 2019-09-19 DIAGNOSIS — I4891 Unspecified atrial fibrillation: Secondary | ICD-10-CM

## 2019-09-20 ENCOUNTER — Other Ambulatory Visit: Payer: Self-pay

## 2019-09-20 ENCOUNTER — Other Ambulatory Visit: Payer: Medicare Other

## 2019-09-20 DIAGNOSIS — I4891 Unspecified atrial fibrillation: Secondary | ICD-10-CM | POA: Diagnosis not present

## 2019-09-22 LAB — BASIC METABOLIC PANEL
BUN/Creatinine Ratio: 22 (ref 10–24)
BUN: 34 mg/dL (ref 10–36)
CO2: 22 mmol/L (ref 20–29)
Calcium: 10 mg/dL (ref 8.6–10.2)
Chloride: 105 mmol/L (ref 96–106)
Creatinine, Ser: 1.58 mg/dL — ABNORMAL HIGH (ref 0.76–1.27)
GFR calc Af Amer: 44 mL/min/{1.73_m2} — ABNORMAL LOW (ref >59)
GFR calc non Af Amer: 38 mL/min/{1.73_m2} — ABNORMAL LOW (ref >59)
Glucose: 110 mg/dL — ABNORMAL HIGH (ref 65–99)
Potassium: 4.3 mmol/L (ref 3.5–5.2)
Sodium: 143 mmol/L (ref 134–144)

## 2019-09-26 ENCOUNTER — Telehealth: Payer: Self-pay | Admitting: Interventional Cardiology

## 2019-09-26 DIAGNOSIS — Z79899 Other long term (current) drug therapy: Secondary | ICD-10-CM

## 2019-09-26 MED ORDER — APIXABAN 2.5 MG PO TABS: 2.5000 mg | ORAL_TABLET | Freq: Two times a day (BID) | ORAL | 0 refills | Status: DC

## 2019-09-26 NOTE — Telephone Encounter (Signed)
Returned call to pt's son, Derek, [name].  Pt was there and gave verbal ok to speak with son with lab results and recommendations.  They will reduce the Eliqius to 2.5 bid and repeat labs 11/17/2019.  He verbalized understanding and did state that pt wasn't taking in a lot if liquids at all and he was going to try to push more on him.  Will route to nurse, [place], to make aware of results given.

## 2019-09-26 NOTE — Telephone Encounter (Signed)
New Message  Pt's son, Richey Soriano, Brooke Bonito called returning a call regarding lab results  Please call to discuss

## 2019-10-05 ENCOUNTER — Other Ambulatory Visit: Payer: Self-pay | Admitting: Physician Assistant

## 2019-10-05 MED ORDER — APIXABAN 2.5 MG PO TABS: 2.5000 mg | ORAL_TABLET | Freq: Two times a day (BID) | ORAL | 5 refills | Status: DC

## 2019-10-05 NOTE — Telephone Encounter (Signed)
-----   Message from Belva Crome, MD sent at 10/05/2019 12:30 PM EST ----- Regarding: Downward adjustment in apixaban dose We should switch the patient to 2.5 mg apixaban p.o. twice daily ----- Message ----- From: Brynda Peon, RN Sent: 10/05/2019   8:19 AM EST To: Belva Crome, MD  Pt last saw Ermalinda Barrios, PA on 05/04/19, last labs 09/20/19 Creat 1.58, age 86, weight 85.3kg, based on specified criteria pt is not on appropriate dosage of Eliquis.  Given age >44 and Creat >1.5, pt should be taking Eliquis 2.5mg  BID.  Creat appears to have been trending up in 2020, please advise if dosage change is appropriate.

## 2019-10-05 NOTE — Telephone Encounter (Signed)
Pt last saw Ermalinda Barrios, PA on 05/04/19, last labs 09/20/19 Creat 1.58, age 86, weight 85.3kg, based on specified criteria pt is not on appropriate dosage of Eliquis.  Given age >36 and Creat >1.5, pt should be taking Eliquis 2.5mg  BID.  Creat appears to have been trending up in 2020, please advise if dosage change is appropriate. Staff message sent to Dr Tamala Julian, will await response.

## 2019-10-05 NOTE — Telephone Encounter (Signed)
Called pt's daughter [name], advised of dosage change from 5mg  to 2.5mg  BID.

## 2019-10-06 ENCOUNTER — Other Ambulatory Visit: Payer: Self-pay | Admitting: Physician Assistant

## 2019-10-06 DIAGNOSIS — Z961 Presence of intraocular lens: Secondary | ICD-10-CM | POA: Diagnosis not present

## 2019-10-06 DIAGNOSIS — H353132 Nonexudative age-related macular degeneration, bilateral, intermediate dry stage: Secondary | ICD-10-CM | POA: Diagnosis not present

## 2019-10-06 DIAGNOSIS — H40053 Ocular hypertension, bilateral: Secondary | ICD-10-CM | POA: Diagnosis not present

## 2019-10-11 ENCOUNTER — Ambulatory Visit: Payer: Medicare Other | Admitting: Neurology

## 2019-10-13 ENCOUNTER — Encounter (INDEPENDENT_AMBULATORY_CARE_PROVIDER_SITE_OTHER): Payer: Medicare Other | Admitting: Ophthalmology

## 2019-10-26 ENCOUNTER — Encounter (INDEPENDENT_AMBULATORY_CARE_PROVIDER_SITE_OTHER): Payer: Medicare Other | Admitting: Ophthalmology

## 2019-10-26 DIAGNOSIS — H43813 Vitreous degeneration, bilateral: Secondary | ICD-10-CM | POA: Diagnosis not present

## 2019-10-26 DIAGNOSIS — H353132 Nonexudative age-related macular degeneration, bilateral, intermediate dry stage: Secondary | ICD-10-CM

## 2019-10-28 DIAGNOSIS — I480 Paroxysmal atrial fibrillation: Secondary | ICD-10-CM | POA: Diagnosis not present

## 2019-10-28 DIAGNOSIS — E7800 Pure hypercholesterolemia, unspecified: Secondary | ICD-10-CM | POA: Diagnosis not present

## 2019-10-28 DIAGNOSIS — N400 Enlarged prostate without lower urinary tract symptoms: Secondary | ICD-10-CM | POA: Diagnosis not present

## 2019-10-28 DIAGNOSIS — F322 Major depressive disorder, single episode, severe without psychotic features: Secondary | ICD-10-CM | POA: Diagnosis not present

## 2019-10-28 DIAGNOSIS — G459 Transient cerebral ischemic attack, unspecified: Secondary | ICD-10-CM | POA: Diagnosis not present

## 2019-10-28 DIAGNOSIS — I639 Cerebral infarction, unspecified: Secondary | ICD-10-CM | POA: Diagnosis not present

## 2019-10-28 DIAGNOSIS — I10 Essential (primary) hypertension: Secondary | ICD-10-CM | POA: Diagnosis not present

## 2019-10-28 DIAGNOSIS — F0390 Unspecified dementia without behavioral disturbance: Secondary | ICD-10-CM | POA: Diagnosis not present

## 2019-10-28 DIAGNOSIS — N183 Chronic kidney disease, stage 3 unspecified: Secondary | ICD-10-CM | POA: Diagnosis not present

## 2019-10-28 DIAGNOSIS — I2510 Atherosclerotic heart disease of native coronary artery without angina pectoris: Secondary | ICD-10-CM | POA: Diagnosis not present

## 2019-10-28 DIAGNOSIS — E785 Hyperlipidemia, unspecified: Secondary | ICD-10-CM | POA: Diagnosis not present

## 2019-11-17 ENCOUNTER — Other Ambulatory Visit: Payer: Medicare Other

## 2019-11-17 ENCOUNTER — Other Ambulatory Visit: Payer: Self-pay

## 2019-11-17 DIAGNOSIS — Z79899 Other long term (current) drug therapy: Secondary | ICD-10-CM | POA: Diagnosis not present

## 2019-11-18 ENCOUNTER — Other Ambulatory Visit: Payer: Self-pay

## 2019-11-18 DIAGNOSIS — I480 Paroxysmal atrial fibrillation: Secondary | ICD-10-CM

## 2019-11-18 DIAGNOSIS — I10 Essential (primary) hypertension: Secondary | ICD-10-CM

## 2019-11-18 LAB — BASIC METABOLIC PANEL
BUN/Creatinine Ratio: 15 (ref 10–24)
BUN: 22 mg/dL (ref 10–36)
CO2: 21 mmol/L (ref 20–29)
Calcium: 9.6 mg/dL (ref 8.6–10.2)
Chloride: 104 mmol/L (ref 96–106)
Creatinine, Ser: 1.49 mg/dL — ABNORMAL HIGH (ref 0.76–1.27)
GFR calc Af Amer: 47 mL/min/{1.73_m2} — ABNORMAL LOW (ref >59)
GFR calc non Af Amer: 41 mL/min/{1.73_m2} — ABNORMAL LOW (ref >59)
Glucose: 95 mg/dL (ref 65–99)
Potassium: 4.6 mmol/L (ref 3.5–5.2)
Sodium: 144 mmol/L (ref 134–144)

## 2019-11-23 ENCOUNTER — Other Ambulatory Visit: Payer: Self-pay | Admitting: Physician Assistant

## 2019-11-23 NOTE — Telephone Encounter (Signed)
Age 86, weight 85.3kg, SCr 1.49 checked on 11/17/19 (1.58 when checked in January - has fluctuated over/under 1.5 on a regular basis). With SCr so close to 1.5, ok to continue current reduced dose of Eliquis 2.5mg  BID (dose was reduced on 09/26/19 phone note)-confirmed lower dosing by Ermalinda Barrios, PA on 11/17/19 BMET as well. Last OV Aug 2020, afib indication.

## 2019-12-28 ENCOUNTER — Encounter: Payer: Self-pay | Admitting: Neurology

## 2019-12-28 ENCOUNTER — Other Ambulatory Visit: Payer: Self-pay

## 2019-12-28 ENCOUNTER — Ambulatory Visit (INDEPENDENT_AMBULATORY_CARE_PROVIDER_SITE_OTHER): Payer: Medicare Other | Admitting: Neurology

## 2019-12-28 VITALS — BP 132/83 | HR 106 | Temp 97.0°F | Ht 67.0 in | Wt 181.0 lb

## 2019-12-28 DIAGNOSIS — G309 Alzheimer's disease, unspecified: Secondary | ICD-10-CM | POA: Diagnosis not present

## 2019-12-28 DIAGNOSIS — F0280 Dementia in other diseases classified elsewhere without behavioral disturbance: Secondary | ICD-10-CM | POA: Diagnosis not present

## 2019-12-28 DIAGNOSIS — R269 Unspecified abnormalities of gait and mobility: Secondary | ICD-10-CM | POA: Insufficient documentation

## 2019-12-28 MED ORDER — DONEPEZIL HCL 10 MG PO TABS: 10.0000 mg | ORAL_TABLET | Freq: Every day | ORAL | 3 refills | Status: DC

## 2019-12-28 NOTE — Patient Instructions (Signed)
It was nice to see you today See you back in 6 months Stay on Aricept Keep follow-up with cardiology

## 2019-12-28 NOTE — Progress Notes (Signed)
PATIENT: Derek [name] DOB: 1927-07-10  REASON FOR VISIT: follow up HISTORY FROM: patient  HISTORY OF PRESENT ILLNESS: Today 12/28/19  Mr. Derek [name] is a 86 year old male with history of a progressive memory disturbance. In 2019, he was evaluated for syncopal episode, possible seizure, at one time was on Keppra, but his family took him off, he has not had any further episodes. His son lives with him. He does not drive, he requires assistance with his medications and affairs. He has been unable to tolerate Namenda, he remains on Aricept.  He has had some decline in the memory.  He seems to be sleeping well and has good appetite.  He can do most of his own daily activities.  He has had a few falls, his walking is unsteady.  He refuses to use a cane or walker.  He has a shuffling gait.  He still has a tendency for postprandial syncope, he may get diaphoretic after a large meal, if he lies down, he quickly feels better.  He is not very active.  He recently went on a car ride with family, afterwards, he was wiped out. It took him over a day to recover.  He remains on Eliquis for A. fib.  He is going to be seen his cardiologist, due to fatigue with any kind of exertion.  He is planning to have a sitter, in the home.  His mood, he is overall pleasant and agreeable.  He has a cat he enjoys.  He presents today for evaluation accompanied by his son.  HISTORY 04/04/2019 Dr. Jannifer Franklin: Mr. Derek [name] is an 86 year old right-handed white male with a history of a progressive memory disturbance.  The patient has not been seen in 1 year.  He remains on Aricept taking 10 mg in the evening, at times he may have vivid dreams, he may come out of the dream somewhat confused.  This is a rare occurrence, however.  The patient continues to have progression of his memory, particularly over the last 2 months.  His son is now living with him, and is caring for him.  The patient is able to dress himself and bathe himself, he does  have occasional urinary incontinence.  He requires assistance keeping up with medications, appointments, and the finances.  He does not operate a motor vehicle.  He does have a 5 stairs he needs to go up and down, but he holds onto the railing doing this.  He has not had any falls but is ability to walk is becoming more unsteady, and he overall is much slower than he used to be.  The patient still has a tendency for postprandial syncope, he may get diaphoretic after large meal, if he lies down for 5 minutes he feels better.  He has not had any overt syncopal events.  He is off Keppra.  In the past, he cannot tolerate Namenda.  REVIEW OF SYSTEMS: Out of a complete 14 system review of symptoms, the patient complains only of the following symptoms, and all other reviewed systems are negative.  Memory loss, walking difficulty  ALLERGIES: Allergies  Allergen Reactions  . Augmentin [Amoxicillin-Pot Clavulanate] Rash  . Keppra [Levetiracetam]     Trouble with walking, fatigued  . Penicillins Rash    Has patient had a PCN reaction causing immediate rash, facial/tongue/throat swelling, SOB or lightheadedness with hypotension: Yes Has patient had a PCN reaction causing severe rash involving mucus membranes or skin necrosis: Unk Has patient had a PCN reaction  that required hospitalization: Yes Has patient had a PCN reaction occurring within the last 10 years: No If all of the above answers are "NO", then may proceed with Cephalosporin use.   . Sulfa Antibiotics Rash    HOME MEDICATIONS: Outpatient Medications Prior to Visit  Medication Sig Dispense Refill  . amLODipine (NORVASC) 10 MG tablet Take 1 tablet (10 mg total) by mouth daily. 90 tablet 3  . atorvastatin (LIPITOR) 40 MG tablet TAKE 1 TABLET ONCE DAILY AT 6 PM. 90 tablet 3  . cholecalciferol (VITAMIN D) 1000 UNITS tablet Take 1,000 Units by mouth daily.    Marland Kitchen donepezil (ARICEPT) 10 MG tablet TAKE ONE TABLET AT BEDTIME. 30 tablet 0  . ELIQUIS  2.5 MG TABS tablet TAKE 1 TABLET BY MOUTH TWICE DAILY. 180 tablet 1  . isosorbide mononitrate (IMDUR) 30 MG 24 hr tablet Take 1 tablet (30 mg total) by mouth daily. Patient must keep upcoming appointment for further refills 90 tablet 3  . metoprolol tartrate (LOPRESSOR) 25 MG tablet Take 1/2 tablet (12.5 mg) twice daily (Patient taking differently: 12.5 mg. Take 1/2 tablet (12.5 mg) twice daily) 90 tablet 3  . Multiple Vitamins-Minerals (CENTRUM SILVER PO) Take 1 tablet by mouth daily.     . nitroGLYCERIN (NITROSTAT) 0.4 MG SL tablet Place 1 tablet (0.4 mg total) under the tongue every 5 (five) minutes as needed for chest pain. 25 tablet 2  . omeprazole (PRILOSEC) 20 MG capsule Take 20 mg by mouth daily before breakfast.      No facility-administered medications prior to visit.    PAST MEDICAL HISTORY: Past Medical History:  Diagnosis Date  . Broken shoulder   . Cerebrovascular disease    MRI/MRA in 2007 - 50% stenosis of supraclinoid ICA, left  . Clavicle fracture   . Closed wedge compression fracture of T1 vertebra ([facility]) 02/20/2018  . Complex sleep apnea syndrome    adapt SV titration EEP 6 min 6 and max 15 cm water.   . Compression fracture   . Coronary atherosclerosis of native coronary artery 07/12/2013   a. NSTEMI in setting of AF with RVR 11/14 >> BMS to CFX;  b. Nuclear (9/15):  High risk - apical lateral ischemia >> c. LHC (9/15):  pLAD 50%, D1 (smaller br 90%/larger br 80%, pDx 50-60%, dLAD 50%, prox-mid CFX stent ok, pOM1 and OM2 50-70% (jailed by stent), OM5 70-80%, mPDA 90% (CFX dsz unchanged from 2014), RCA diff dsz, EF 55%, mild ant-apical HK >>  Med Rx  . CVA (cerebral vascular accident) ([facility] [place])   . Dementia ([place])   . History of pneumonia   . Hx of echocardiogram    a. Echo 11/14): EF 55-60%, no RWMA, grade 2 DD, mild aortic stenosis (mean 15 mmHg), mild LAE  . Hypertension   . Lung nodule < 6cm on CT 02/20/2018  . MI (myocardial infarction) ([facility]) 07/2013  . PAF (paroxysmal  atrial fibrillation) ([facility])    hx of NSTEMI in setting of AF with RVR 07/2013;  Eliquis for AC  . Shingles    left chest  . Syncope   . TIA (transient ischemic attack) 07/12/2013    PAST SURGICAL HISTORY: Past Surgical History:  Procedure Laterality Date  . CATARACT EXTRACTION Bilateral   . KYPHOPLASTY    . LEFT HEART CATHETERIZATION WITH CORONARY ANGIOGRAM N/A 08/08/2013   Procedure: LEFT HEART CATHETERIZATION WITH CORONARY ANGIOGRAM;  Surgeon: Jettie Booze, MD;  Location: [facility] CATH LAB;  Service: Cardiovascular;  Laterality: N/A;  .  LEFT HEART CATHETERIZATION WITH CORONARY ANGIOGRAM N/A 05/29/2014   Procedure: LEFT HEART CATHETERIZATION WITH CORONARY ANGIOGRAM;  Surgeon: Sinclair Grooms, MD;  Location: [facility] CATH LAB;  Service: Cardiovascular;  Laterality: N/A;  . PERCUTANEOUS STENT INTERVENTION  08/08/2013   Procedure: PERCUTANEOUS STENT INTERVENTION;  Surgeon: Jettie Booze, MD;  Location: [facility] CATH LAB;  Service: Cardiovascular;;  . SHOULDER SURGERY Left   . TONSILLECTOMY      FAMILY HISTORY: Family History  Problem Relation Age of Onset  . Diabetes Mother   . Diabetes Father   . Cancer - Lung Brother   . Gait disorder Brother   . Cancer Daughter   . Stroke Sister   . Heart attack Neg Hx     SOCIAL HISTORY: Social History   Socioeconomic History  . Marital status: Widowed    Spouse name: Not on file  . Number of children: 2  . Years of education: Engineer, maintenance (IT)  . Highest education level: Not on file  Occupational History  . Occupation: retired  Tobacco Use  . Smoking status: Never Smoker  . Smokeless tobacco: Never Used  Substance and Sexual Activity  . Alcohol use: Yes    Alcohol/week: 1.0 standard drinks    Types: 1 Glasses of wine per week    Comment: occasionally  . Drug use: No  . Sexual activity: Not on file  Other Topics Concern  . Not on file  Social History Narrative   Patient is widowed [name]), has 2 children   Patient is right handed    Education level is 4 year degree.   Caffeine consumption is 2-4 cups daily   Social Determinants of Health   Financial Resource Strain:   . Difficulty of Paying Living Expenses:   Food Insecurity:   . Worried About Charity fundraiser in the Last Year:   . Arboriculturist in the Last Year:   Transportation Needs:   . Film/video editor (Medical):   Marland Kitchen Lack of Transportation (Non-Medical):   Physical Activity:   . Days of Exercise per Week:   . Minutes of Exercise per Session:   Stress:   . Feeling of Stress :   Social Connections:   . Frequency of Communication with Friends and Family:   . Frequency of Social Gatherings with Friends and Family:   . Attends Religious Services:   . Active Member of Clubs or Organizations:   . Attends Archivist Meetings:   Marland Kitchen Marital Status:   Intimate Partner Violence:   . Fear of Current or Ex-Partner:   . Emotionally Abused:   Marland Kitchen Physically Abused:   . Sexually Abused:    PHYSICAL EXAM  Vitals:   12/28/19 1319  BP: 132/83  Pulse: (!) 106  Temp: (!) 97 F (36.1 C)  Weight: 181 lb (82.1 kg)  Height: 5\' 7"  (1.702 m)   Body mass index is 28.35 kg/m.  Generalized: Well developed, in no acute distress  MMSE - Mini Mental State Exam 12/28/2019 04/04/2019 04/05/2018  Orientation to time 0 1 1  Orientation to Place 5 4 4   Registration 3 3 3   Attention/ Calculation 3 5 5   Recall 0 2 0  Language- name 2 objects 2 2 2   Language- repeat 1 0 1  Language- follow 3 step command 1 3 3   Language- read & follow direction 1 1 1   Write a sentence 1 1 1   Copy design 0 1 1  Copy design-comments 3 animals - -  Total score 17 23 22     Neurological examination  Mentation: Alert oriented to time, place, history is equally provided by the patient and his son. Follows all commands speech and language fluent.  Mild masking of the face is seen. Cranial nerve II-XII: Pupils were equal round reactive to light. Extraocular movements were full,  visual field were full on confrontational test. Facial sensation and strength were normal. Head turning and shoulder shrug  were normal and symmetric. Motor: The motor testing reveals 5 over 5 strength of all 4 extremities. Good symmetric motor tone is noted throughout.  No tremor is noted. Sensory: Sensory testing is intact to soft touch on all 4 extremities. No evidence of extinction is noted.  Coordination: Cerebellar testing reveals good finger-nose-finger and heel-to-shin bilaterally.  Gait and station: Has to push off from seated position to stand.  Actually has good strides walking in the hallway, some shuffling and unsteadiness with turning.  Decreased arm swing on the right.  Tandem gait was not attempted. Reflexes: Deep tendon reflexes are symmetric and normal bilaterally.   DIAGNOSTIC DATA (LABS, IMAGING, TESTING) - I reviewed patient records, labs, notes, testing and imaging myself where available.  Lab Results  Component Value Date   WBC 8.2 05/04/2019   HGB 15.8 05/04/2019   HCT 48.3 05/04/2019   MCV 91 05/04/2019   PLT 212 05/04/2019      Component Value Date/Time   NA 144 11/17/2019 1522   K 4.6 11/17/2019 1522   CL 104 11/17/2019 1522   CO2 21 11/17/2019 1522   GLUCOSE 95 11/17/2019 1522   GLUCOSE 84 02/20/2018 0633   BUN 22 11/17/2019 1522   CREATININE 1.49 (H) 11/17/2019 1522   CALCIUM 9.6 11/17/2019 1522   PROT 6.7 05/04/2019 1335   ALBUMIN 4.5 05/04/2019 1335   AST 22 05/04/2019 1335   ALT 22 05/04/2019 1335   ALKPHOS 102 05/04/2019 1335   BILITOT 0.5 05/04/2019 1335   GFRNONAA 41 (L) 11/17/2019 1522   GFRAA 47 (L) 11/17/2019 1522   Lab Results  Component Value Date   CHOL 100 12/27/2014   HDL 29.50 (L) 12/27/2014   LDLCALC 57 12/27/2014   TRIG 67.0 12/27/2014   CHOLHDL 3 12/27/2014   Lab Results  Component Value Date   HGBA1C 6.3 (H) 12/11/2014   Lab Results  Component Value Date   VITAMINB12 659 02/19/2018   Lab Results  Component Value  Date   TSH 0.956 05/04/2019      ASSESSMENT AND PLAN 86 y.o. year old male  has a past medical history of Broken shoulder, Cerebrovascular disease, Clavicle fracture, Closed wedge compression fracture of T1 vertebra ([facility]) (02/20/2018), Complex sleep apnea syndrome, Compression fracture, Coronary atherosclerosis of native coronary artery (07/12/2013), CVA (cerebral vascular accident) ([facility]), Dementia ([place]), History of pneumonia, echocardiogram, Hypertension, Lung nodule < 6cm on CT (02/20/2018), MI (myocardial infarction) ([place]) (07/2013), PAF (paroxysmal atrial fibrillation) ([place]), Shingles, Syncope, and TIA (transient ischemic attack) (07/12/2013). here with:  1.  Progressive memory disturbance 2.  History of syncope, seizure event in 2019 3.  Gait disturbance  He continues to have a decline in his cognitive abilities.  He continues to have some slowness of ambulation, we are following for signs of parkinsonism.  He seems to have remained stable since last seen.  He is not interested in physical therapy at this time.  If he gets a good report from cardiology, he says he may change his mind.  He will remain on Aricept, he  is unable to tolerate Namenda.  His son feels he has overall remained stable since last seen.  He will follow-up in 6 months or sooner if needed.  I spent 30 minutes of face-to-face and non-face-to-face time with patient.  This included previsit chart review, lab review, study review, order entry, electronic health record documentation, patient education.    Butler Denmark, AGNP-C, DNP 12/28/2019, 1:30 PM [facility] [address], [place], [place] [address] [phone]

## 2019-12-30 NOTE — Progress Notes (Signed)
I have read the note, and I agree with the clinical assessment and plan.  Arlina Sabina K Shamus Desantis   

## 2020-01-18 ENCOUNTER — Other Ambulatory Visit: Payer: Medicare Other

## 2020-01-24 NOTE — Progress Notes (Signed)
Cardiology Office Note    Date:  01/31/2020   ID:  Derek [name], DOB 12-10-1926, MRN [id number]  PCP:  Derek Carol, MD  Cardiologist: Sinclair Grooms, MD EPS: None  Chief Complaint  Patient presents with  . Follow-up  . Fatigue    History of Present Illness:  Derek [name] is a 86 y.o. male  with a history of CAD s/p BMS to CFx (2014), PAF on Eliquis, embolic CVA, OSA non complaint with CPAP, mild AS, CKD stage II-III dementia and previous syncope .  Patient has had progressive dementia and was having some confusion over Eliquis dose.High risk NST 2015.last cath 2015 widely patent proximal LAD and circumflex.  Circumflex stent patent, high grade disease in the first diagonal, fifth OM and PDA accounting for the high risk abnormality on NST.  Diffuse disease in nondominant RCA, preserved overall LV function.  Medical therapy recommended.   I saw the patient for chest pain cough and wheezing 12/2017 at which time I got an x-ray and he had pneumonia.  He had also had a syncopal episode and Holter monitor showed normal sinus rhythm with PACs but no excessively slow rates, atrial fib or other arrhythmias to call syncope.  2D echo showed mild to moderate aortic stenosis with no significant change from 2 years prior.  Eliquis was adjusted down to 2.5 mg twice daily because of increased creatinine over 1.5 and age  I last saw him 05/04/2019 at which time the patient continued to have syncopal episodes if he would bend over.  He was bradycardic at 53 and had not had his metoprolol yet that day.  I decreased his metoprolol to 12.5 mg twice daily.  I also ordered a 2D echo to reassess aortic stenosis but patient had physical and mental decline and they declined any further testing  Patient domes in for f/u accompanied by his son. His son says sometimes he has severe fatigue and his hands turn gray. BP has been stable.  Still fainting about once a month.  Decline in dementia and overall  health.  Going to get some long term help at home.    Past Medical History:  Diagnosis Date  . Broken shoulder   . Cerebrovascular disease    MRI/MRA in 2007 - 50% stenosis of supraclinoid ICA, left  . Clavicle fracture   . Closed wedge compression fracture of T1 vertebra ([place]) 02/20/2018  . Complex sleep apnea syndrome    adapt SV titration EEP 6 min 6 and max 15 cm water.   . Compression fracture   . Coronary atherosclerosis of native coronary artery 07/12/2013   a. NSTEMI in setting of AF with RVR 11/14 >> BMS to CFX;  b. Nuclear (9/15):  High risk - apical lateral ischemia >> c. LHC (9/15):  pLAD 50%, D1 (smaller br 90%/larger br 80%, pDx 50-60%, dLAD 50%, prox-mid CFX stent ok, pOM1 and OM2 50-70% (jailed by stent), OM5 70-80%, mPDA 90% (CFX dsz unchanged from 2014), RCA diff dsz, EF 55%, mild ant-apical HK >>  Med Rx  . CVA (cerebral vascular accident) ([place])   . Dementia ([place])   . History of pneumonia   . Hx of echocardiogram    a. Echo 11/14): EF 55-60%, no RWMA, grade 2 DD, mild aortic stenosis (mean 15 mmHg), mild LAE  . Hypertension   . Lung nodule < 6cm on CT 02/20/2018  . MI (myocardial infarction) ([place]) 07/2013  . PAF (paroxysmal atrial fibrillation) ([place])  hx of NSTEMI in setting of AF with RVR 07/2013;  Eliquis for AC  . Shingles    left chest  . Syncope   . TIA (transient ischemic attack) 07/12/2013    Past Surgical History:  Procedure Laterality Date  . CATARACT EXTRACTION Bilateral   . KYPHOPLASTY    . LEFT HEART CATHETERIZATION WITH CORONARY ANGIOGRAM N/A 08/08/2013   Procedure: LEFT HEART CATHETERIZATION WITH CORONARY ANGIOGRAM;  Surgeon: Jettie Booze, MD;  Location: [facility] CATH LAB;  Service: Cardiovascular;  Laterality: N/A;  . LEFT HEART CATHETERIZATION WITH CORONARY ANGIOGRAM N/A 05/29/2014   Procedure: LEFT HEART CATHETERIZATION WITH CORONARY ANGIOGRAM;  Surgeon: Sinclair Grooms, MD;  Location: [facility] CATH LAB;  Service: Cardiovascular;  Laterality: N/A;    . PERCUTANEOUS STENT INTERVENTION  08/08/2013   Procedure: PERCUTANEOUS STENT INTERVENTION;  Surgeon: Jettie Booze, MD;  Location[facility] C[facility] LAB;  Service: Cardiovascular;;  . SHOULDER SURGERY Left   . TONSILLECTOMY      Current Medications: Current Meds  Medication Sig  . amLODipine (NORVASC) 5 MG tablet Take 1 tablet (5 mg total) by mouth daily.  Marland Kitchen atorvastatin (LIPITOR) 40 MG tablet TAKE 1 TABLET ONCE DAILY AT 6 PM.  . cholecalciferol (VITAMIN D) 1000 UNITS tablet Take 1,000 Units by mouth daily.  Marland Kitchen donepezil (ARICEPT) 10 MG tablet Take 1 tablet (10 mg total) by mouth at bedtime.  Marland Kitchen ELIQUIS 2.5 MG TABS tablet TAKE 1 TABLET BY MOUTH TWICE DAILY.  . isosorbide mononitrate (IMDUR) 30 MG 24 hr tablet Take 1 tablet (30 mg total) by mouth daily. Patient must keep upcoming appointment for further refills  . Multiple Vitamins-Minerals (CENTRUM SILVER PO) Take 1 tablet by mouth daily.   . nitroGLYCERIN (NITROSTAT) 0.4 MG SL tablet Place 1 tablet (0.4 mg total) under the tongue every 5 (five) minutes as needed for chest pain.  Marland Kitchen omeprazole (PRILOSEC) 20 MG capsule Take 20 mg by mouth daily before breakfast.   . [DISCONTINUED] amLODipine (NORVASC) 10 MG tablet Take 1 tablet (10 mg total) by mouth daily.  . [DISCONTINUED] metoprolol tartrate (LOPRESSOR) 25 MG tablet Take 1/2 tablet (12.5 mg) twice daily     Allergies:   Augmentin [amoxicillin-pot clavulanate], Keppra [levetiracetam], Penicillins, and Sulfa antibiotics   Social History   Socioeconomic History  . Marital status: Widowed    Spouse name: Not on file  . Number of children: 2  . Years of education: Engineer, maintenance (IT)  . Highest education level: Not on file  Occupational History  . Occupation: retired  Tobacco Use  . Smoking status: Never Smoker  . Smokeless tobacco: Never Used  Substance and Sexual Activity  . Alcohol use: Yes    Alcohol/week: 1.0 standard drinks    Types: 1 Glasses of wine per week    Comment: occasionally  . Drug  use: No  . Sexual activity: Not on file  Other Topics Concern  . Not on file  Social History Narrative   Patient is widowed [name]), has 2 children   Patient is right handed   Education level is 4 year degree.   Caffeine consumption is 2-4 cups daily   Social Determinants of Health   Financial Resource Strain:   . Difficulty of Paying Living Expenses:   Food Insecurity:   . Worried About Charity fundraiser in the Last Year:   . Arboriculturist in the Last Year:   Transportation Needs:   . Film/video editor (Medical):   Marland Kitchen Lack of  Transportation (Non-Medical):   Physical Activity:   . Days of Exercise per Week:   . Minutes of Exercise per Session:   Stress:   . Feeling of Stress :   Social Connections:   . Frequency of Communication with Friends and Family:   . Frequency of Social Gatherings with Friends and Family:   . Attends Religious Services:   . Active Member of Clubs or Organizations:   . Attends Archivist Meetings:   Marland Kitchen Marital Status:      Family History:  The patient's   family history includes Cancer in his daughter; Cancer - Lung in his brother; Diabetes in his father and mother; Gait disorder in his brother; Stroke in his sister.   ROS:   Please see the history of present illness.    ROS All other systems reviewed and are negative.   PHYSICAL EXAM:   VS:  BP (!) 108/56   Pulse 65   Ht 5\' 7"  (1.702 m)   Wt 173 lb (78.5 kg)   SpO2 94%   BMI 27.10 kg/m   Physical Exam  GEN: Well nourished, well developed, in no acute distress  Neck: no JVD, carotid bruits, or masses Cardiac:RRR; 2/6 systolic murmur at the left sternal border Respiratory:  clear to auscultation bilaterally, normal work of breathing GI: soft, nontender, nondistended, + BS Ext: without cyanosis, clubbing, or edema, Good distal pulses bilaterally Neuro:  Alert and Oriented x 3 Psych: euthymic mood, full affect  Wt Readings from Last 3 Encounters:  01/31/20 173 lb (78.5  kg)  12/28/19 181 lb (82.1 kg)  06/02/19 188 lb (85.3 kg)      Studies/Labs Reviewed:   EKG:  EKG is not ordered today.   Recent Labs: 05/04/2019: ALT 22; Hemoglobin 15.8; Platelets 212; TSH 0.956 11/17/2019: BUN 22; Creatinine, Ser 1.49; Potassium 4.6; Sodium 144   Lipid Panel    Component Value Date/Time   CHOL 100 12/27/2014 0916   TRIG 67.0 12/27/2014 0916   HDL 29.50 (L) 12/27/2014 0916   CHOLHDL 3 12/27/2014 0916   VLDL 13.4 12/27/2014 0916   LDLCALC 57 12/27/2014 0916    Additional studies/ records that were reviewed today include:    Holter monitor 4/22/2019Normal sinus rhythm  PACs accounting for up to 6% of cardiac activity.  Rare PVCs  No atrial fibrillation or ventricular tachycardia noted  No excessive bradycardia.   Normal sinus rhythm PACs Rare PVCs No atrial fib, excessive bradycardia, or significant ventricular ectopy.   HEART RATE EPISODES Minimum HR: 45 BPM at 12:22:21 PM Maximum HR: 111 BPM at 7:52:44 AM Average HR: 61 BPM     Chest x-ray 4/22/2019IMPRESSION: Right infrahilar opacity, likely pneumonia.   These results will be called to the ordering clinician or representative by the Radiologist Assistant, and communication documented in the PACS or zVision Dashboard.    Echo 4/19/19Study Conclusions   - Left ventricle: The cavity size was normal. Wall thickness was   increased in a pattern of mild LVH. Systolic function was normal.   The estimated ejection fraction was in the range of 55% to 60%.   Left ventricular diastolic function parameters were normal. - Aortic valve: Gradient similar to 12/2015 when mean gradient 18   mmHg and peak 28 mmHg. There was mild to moderate stenosis. Valve   area (VTI): 1.29 cm^2. Valve area (Vmax): 1.23 cm^2. Valve area   (Vmean): 1.21 cm^2. - Mitral valve: Calcified annulus. Moderately thickened, moderately   calcified leaflets .  Valve area by pressure half-time: 2.24 cm^2. - Left atrium: The atrium  was mildly dilated. - Atrial septum: No defect or patent foramen ovale was identified.            ASSESSMENT:    1. History of syncope   2. Coronary artery disease involving native coronary artery of native heart without angina pectoris   3. Paroxysmal atrial fibrillation (HCC)   4. Essential hypertension   5. Hyperlipidemia, unspecified hyperlipidemia type   6. Aortic valve stenosis, etiology of cardiac valve disease unspecified   7. Fatigue, unspecified type      PLAN:  In order of problems listed above:     History of Syncope   Holter monitor did not show any A. fib or significant arrhythmia.  2D echo 12/2017 normal LV EF with mild to moderate aortic stenosis it has not progressed since 2017.  LOV 04/2019 Patient continues to have syncopal episodes if he bends over but his son usually can get him to lay down and then he does not pass out. He was bradycardic at 53 bpm and  I decreased metoprolol to 12.5 mg twice daily. BP running low.will decrease amlodipine 5 mg once daily.  CAD status post BMS to the circumflex 2014, last cath widely patent proximal LAD and circumflex.  Patent circumflex stent, high-grade disease in the first diagonal, fifth OM and PDA accounting for high risk abnormality on NST.  Diffuse disease in the nondominant RCA and preserved LV function.  Medical therapy recommended.   No angina   PAF on Eliquis 2.5 mg twice daily no A. fib on 12/2017 Holter.   Last creatinine 1.49 11/17/2019-recheck labs today   History of embolic CVA on Eliquis   Essential hypertension blood pressure running low-decrease amlodipine   Mixed hyperlipidemia on Lipitor   Aortic stenosis mild to moderate on 4/2019echo.  Unchanged since 2017. Son asks if we should repeat but with progressive dementia I don't think he'd be a candidate for surgery. Will hold off for now.   Fatigue-patient sleeping a lot and not getting up hardly at all. Fingers turn grayish color per son. Excellent pulses.  Suspect secondary to age, deconditioning. Will check TSH. PCP offered PT which is reasonable.    Medication Adjustments/Labs and Tests Ordered: Current medicines are reviewed at length with the patient today.  Concerns regarding medicines are outlined above.  Medication changes, Labs and Tests ordered today are listed in the Patient Instructions below. Patient Instructions  Medication Instructions:  Your physician has recommended you make the following change in your medication:   1. DECREASE: amlodipine to 5 mg once a day  2. STOP: metoprolol tartrate (lopressor)  3. START: metoprolol succinate (Toprol-XL) 25 mg tablet: Take 1 tablet by mouth once a day  *If you need a refill on your cardiac medications before your next appointment, please call your pharmacy*   Lab Work: TODAY: CBC, CMET, TSH  If you have labs (blood work) drawn today and your tests are completely normal, you will receive your results only by: Marland Kitchen MyChart Message (if you have MyChart) OR . A paper copy in the mail If you have any lab test that is abnormal or we need to change your treatment, we will call you to review the results.   Testing/Procedures: None   Follow-Up: Follow up with Dr. Tamala Julian on 04/06/20 at 3:00 PM   Other Instructions None     Signed, Ermalinda Barrios, PA-C  01/31/2020 1:30 PM    [facility] [address], [place], [place]  [address] Phone: [phone]; Fax: [phone]

## 2020-01-30 DIAGNOSIS — E785 Hyperlipidemia, unspecified: Secondary | ICD-10-CM | POA: Diagnosis not present

## 2020-01-30 DIAGNOSIS — N400 Enlarged prostate without lower urinary tract symptoms: Secondary | ICD-10-CM | POA: Diagnosis not present

## 2020-01-30 DIAGNOSIS — F0390 Unspecified dementia without behavioral disturbance: Secondary | ICD-10-CM | POA: Diagnosis not present

## 2020-01-30 DIAGNOSIS — I480 Paroxysmal atrial fibrillation: Secondary | ICD-10-CM | POA: Diagnosis not present

## 2020-01-30 DIAGNOSIS — I10 Essential (primary) hypertension: Secondary | ICD-10-CM | POA: Diagnosis not present

## 2020-01-30 DIAGNOSIS — F322 Major depressive disorder, single episode, severe without psychotic features: Secondary | ICD-10-CM | POA: Diagnosis not present

## 2020-01-30 DIAGNOSIS — G459 Transient cerebral ischemic attack, unspecified: Secondary | ICD-10-CM | POA: Diagnosis not present

## 2020-01-30 DIAGNOSIS — I639 Cerebral infarction, unspecified: Secondary | ICD-10-CM | POA: Diagnosis not present

## 2020-01-30 DIAGNOSIS — E7800 Pure hypercholesterolemia, unspecified: Secondary | ICD-10-CM | POA: Diagnosis not present

## 2020-01-30 DIAGNOSIS — N183 Chronic kidney disease, stage 3 unspecified: Secondary | ICD-10-CM | POA: Diagnosis not present

## 2020-01-30 DIAGNOSIS — I2510 Atherosclerotic heart disease of native coronary artery without angina pectoris: Secondary | ICD-10-CM | POA: Diagnosis not present

## 2020-01-31 ENCOUNTER — Encounter: Payer: Self-pay | Admitting: Physician Assistant

## 2020-01-31 ENCOUNTER — Other Ambulatory Visit: Payer: Self-pay

## 2020-01-31 ENCOUNTER — Ambulatory Visit (INDEPENDENT_AMBULATORY_CARE_PROVIDER_SITE_OTHER): Payer: Medicare Other | Admitting: Physician Assistant

## 2020-01-31 VITALS — BP 108/56 | HR 65 | Ht 67.0 in | Wt 173.0 lb

## 2020-01-31 DIAGNOSIS — I2510 Atherosclerotic heart disease of native coronary artery without angina pectoris: Secondary | ICD-10-CM

## 2020-01-31 DIAGNOSIS — I10 Essential (primary) hypertension: Secondary | ICD-10-CM

## 2020-01-31 DIAGNOSIS — E785 Hyperlipidemia, unspecified: Secondary | ICD-10-CM | POA: Diagnosis not present

## 2020-01-31 DIAGNOSIS — R5383 Other fatigue: Secondary | ICD-10-CM

## 2020-01-31 DIAGNOSIS — I480 Paroxysmal atrial fibrillation: Secondary | ICD-10-CM

## 2020-01-31 DIAGNOSIS — I350 Nonrheumatic aortic (valve) stenosis: Secondary | ICD-10-CM

## 2020-01-31 DIAGNOSIS — Z87898 Personal history of other specified conditions: Secondary | ICD-10-CM | POA: Diagnosis not present

## 2020-01-31 MED ORDER — AMLODIPINE BESYLATE 5 MG PO TABS: 5.0000 mg | ORAL_TABLET | Freq: Every day | ORAL | 3 refills | Status: DC

## 2020-01-31 MED ORDER — METOPROLOL SUCCINATE ER 25 MG PO TB24: 25.0000 mg | ORAL_TABLET | Freq: Every day | ORAL | 3 refills | Status: DC

## 2020-01-31 NOTE — Patient Instructions (Signed)
Medication Instructions:  Your physician has recommended you make the following change in your medication:   1. DECREASE: amlodipine to 5 mg once a day  2. STOP: metoprolol tartrate (lopressor)  3. START: metoprolol succinate (Toprol-XL) 25 mg tablet: Take 1 tablet by mouth once a day  *If you need a refill on your cardiac medications before your next appointment, please call your pharmacy*   Lab Work: TODAY: CBC, CMET, TSH  If you have labs (blood work) drawn today and your tests are completely normal, you will receive your results only by: Marland Kitchen MyChart Message (if you have MyChart) OR . A paper copy in the mail If you have any lab test that is abnormal or we need to change your treatment, we will call you to review the results.   Testing/Procedures: None   Follow-Up: Follow up with Dr. Tamala Julian on 04/06/20 at 3:00 PM   Other Instructions None

## 2020-02-01 LAB — COMPREHENSIVE METABOLIC PANEL
ALT: 21 IU/L (ref 0–44)
AST: 25 IU/L (ref 0–40)
Albumin/Globulin Ratio: 1.7 (ref 1.2–2.2)
Albumin: 4.2 g/dL (ref 3.5–4.6)
Alkaline Phosphatase: 125 IU/L — ABNORMAL HIGH (ref 48–121)
BUN/Creatinine Ratio: 15 (ref 10–24)
BUN: 23 mg/dL (ref 10–36)
Bilirubin Total: 0.5 mg/dL (ref 0.0–1.2)
CO2: 21 mmol/L (ref 20–29)
Calcium: 9.9 mg/dL (ref 8.6–10.2)
Chloride: 103 mmol/L (ref 96–106)
Creatinine, Ser: 1.5 mg/dL — ABNORMAL HIGH (ref 0.76–1.27)
GFR calc Af Amer: 47 mL/min/{1.73_m2} — ABNORMAL LOW (ref >59)
GFR calc non Af Amer: 40 mL/min/{1.73_m2} — ABNORMAL LOW (ref >59)
Globulin, Total: 2.5 g/dL (ref 1.5–4.5)
Glucose: 92 mg/dL (ref 65–99)
Potassium: 5 mmol/L (ref 3.5–5.2)
Sodium: 140 mmol/L (ref 134–144)
Total Protein: 6.7 g/dL (ref 6.0–8.5)

## 2020-02-01 LAB — CBC
Hematocrit: 44.7 % (ref 37.5–51.0)
Hemoglobin: 14.9 g/dL (ref 13.0–17.7)
MCH: 30.3 pg (ref 26.6–33.0)
MCHC: 33.3 g/dL (ref 31.5–35.7)
MCV: 91 fL (ref 79–97)
Platelets: 198 10*3/uL (ref 150–450)
RBC: 4.92 x10E6/uL (ref 4.14–5.80)
RDW: 12.9 % (ref 11.6–15.4)
WBC: 7.2 10*3/uL (ref 3.4–10.8)

## 2020-02-01 LAB — TSH: TSH: 1.1 u[IU]/mL (ref 0.450–4.500)

## 2020-04-03 DIAGNOSIS — H40053 Ocular hypertension, bilateral: Secondary | ICD-10-CM | POA: Diagnosis not present

## 2020-04-03 DIAGNOSIS — Z961 Presence of intraocular lens: Secondary | ICD-10-CM | POA: Diagnosis not present

## 2020-04-03 DIAGNOSIS — H353132 Nonexudative age-related macular degeneration, bilateral, intermediate dry stage: Secondary | ICD-10-CM | POA: Diagnosis not present

## 2020-04-05 NOTE — Progress Notes (Signed)
Cardiology Office Note:    Date:  04/06/2020   ID:  KIEON LAWHORN, DOB 1927/08/06, MRN [id number]  PCP:  Seward Carol, MD  Cardiologist:  Sinclair Grooms, MD   Referring MD: Seward Carol, MD   Chief Complaint  Patient presents with  . Coronary Artery Disease  . Cardiac Valve Problem    History of Present Illness:    Derek [name] is a 86 y.o. male with a hx of CAD s/p BMS to CFx (2014), PAF on Eliquis, embolic CVA, OSA non complaint with CPAP, mild AS,CKDstage II-III dementia and previous syncopeHIGH RISK NUCLEAR STRESS TEST 2015( high grade disease in the first diagonal, fifth OM and PDA accounting for the high risk abnormality).  He has had intermittent nausea.  There is a standing history of fainting 1-2 times per month over the last several months.  It is not associated with nausea or sweating.  He denies chest pain.  He is accompanied by the son who lives with him.  Significant reduction in cognitive function.  Significant reduction and interaction with others.  Spoken in the past about management and have decided for medical therapy rather than aggressive interventional therapy.  He had mild aortic stenosis by echo in 2019.  Past Medical History:  Diagnosis Date  . Broken shoulder   . Cerebrovascular disease    MRI/MRA in 2007 - 50% stenosis of supraclinoid ICA, left  . Clavicle fracture   . Closed wedge compression fracture of T1 vertebra ([place]) 02/20/2018  . Complex sleep apnea syndrome    adapt SV titration EEP 6 min 6 and max 15 cm water.   . Compression fracture   . Coronary atherosclerosis of native coronary artery 07/12/2013   a. NSTEMI in setting of AF with RVR 11/14 >> BMS to CFX;  b. Nuclear (9/15):  High risk - apical lateral ischemia >> c. LHC (9/15):  pLAD 50%, D1 (smaller br 90%/larger br 80%, pDx 50-60%, dLAD 50%, prox-mid CFX stent ok, pOM1 and OM2 50-70% (jailed by stent), OM5 70-80%, mPDA 90% (CFX dsz unchanged from 2014), RCA diff dsz, EF 55%,  mild ant-apical HK >>  Med Rx  . CVA (cerebral vascular accident) ([place])   . Dementia ([place])   . History of pneumonia   . Hx of echocardiogram    a. Echo 11/14): EF 55-60%, no RWMA, grade 2 DD, mild aortic stenosis (mean 15 mmHg), mild LAE  . Hypertension   . Lung nodule < 6cm on CT 02/20/2018  . MI (myocardial infarction) ([place]) 07/2013  . PAF (paroxysmal atrial fibrillation) ([facility])    hx of NSTEMI in setting of AF with RVR 07/2013;  Eliquis for AC  . Shingles    left chest  . Syncope   . TIA (transient ischemic attack) 07/12/2013    Past Surgical History:  Procedure Laterality Date  . CATARACT EXTRACTION Bilateral   . KYPHOPLASTY    . LEFT HEART CATHETERIZATION WITH CORONARY ANGIOGRAM N/A 08/08/2013   Procedure: LEFT HEART CATHETERIZATION WITH CORONARY ANGIOGRAM;  Surgeon: Jettie Booze, MD;  Location[facility] C[facility] LAB;  Service: Cardiovascular;  Laterality: N/A;  . LEFT HEART CATHETERIZATION WITH CORONARY ANGIOGRAM N/A 05/29/2014   Procedure: LEFT HEART CATHETERIZATION WITH CORONARY ANGIOGRAM;  Surgeon: Sinclair Grooms, MD;  Location: [facility] CATH LAB;  Service: Cardiovascular;  Laterality: N/A;  . PERCUTANEOUS STENT INTERVENTION  08/08/2013   Procedure: PERCUTANEOUS STENT INTERVENTION;  Surgeon: Jettie Booze, MD;  Location: [facility] CATH LAB;  Service:  Cardiovascular;;  . SHOULDER SURGERY Left   . TONSILLECTOMY      Current Medications: Current Meds  Medication Sig  . amLODipine (NORVASC) 5 MG tablet Take 1 tablet (5 mg total) by mouth daily.  Marland Kitchen atorvastatin (LIPITOR) 40 MG tablet TAKE 1 TABLET ONCE DAILY AT 6 PM.  . cholecalciferol (VITAMIN D) 1000 UNITS tablet Take 1,000 Units by mouth daily.  Marland Kitchen donepezil (ARICEPT) 10 MG tablet Take 1 tablet (10 mg total) by mouth at bedtime.  Marland Kitchen ELIQUIS 2.5 MG TABS tablet TAKE 1 TABLET BY MOUTH TWICE DAILY.  . isosorbide mononitrate (IMDUR) 30 MG 24 hr tablet Take 1 tablet (30 mg total) by mouth daily. Patient must keep upcoming appointment for  further refills  . metoprolol succinate (TOPROL-XL) 25 MG 24 hr tablet Take 1 tablet (25 mg total) by mouth daily. Take with or immediately following a meal.  . Multiple Vitamins-Minerals (CENTRUM SILVER PO) Take 1 tablet by mouth daily.   . nitroGLYCERIN (NITROSTAT) 0.4 MG SL tablet Place 1 tablet (0.4 mg total) under the tongue every 5 (five) minutes as needed for chest pain.  Marland Kitchen omeprazole (PRILOSEC) 20 MG capsule Take 20 mg by mouth daily before breakfast.   . [DISCONTINUED] nitroGLYCERIN (NITROSTAT) 0.4 MG SL tablet Place 1 tablet (0.4 mg total) under the tongue every 5 (five) minutes as needed for chest pain.     Allergies:   Augmentin [amoxicillin-pot clavulanate], Keppra [levetiracetam], Penicillins, and Sulfa antibiotics   Social History   Socioeconomic History  . Marital status: Widowed    Spouse name: Not on file  . Number of children: 2  . Years of education: Engineer, maintenance (IT)  . Highest education level: Not on file  Occupational History  . Occupation: retired  Tobacco Use  . Smoking status: Never Smoker  . Smokeless tobacco: Never Used  Substance and Sexual Activity  . Alcohol use: Yes    Alcohol/week: 1.0 standard drink    Types: 1 Glasses of wine per week    Comment: occasionally  . Drug use: No  . Sexual activity: Not on file  Other Topics Concern  . Not on file  Social History Narrative   Patient is widowed Derek [name]), has 2 children   Patient is right handed   Education level is 4 year degree.   Caffeine consumption is 2-4 cups daily   Social Determinants of Health   Financial Resource Strain:   . Difficulty of Paying Living Expenses:   Food Insecurity:   . Worried About Charity fundraiser in the Last Year:   . Arboriculturist in the Last Year:   Transportation Needs:   . Film/video editor (Medical):   Marland Kitchen Lack of Transportation (Non-Medical):   Physical Activity:   . Days of Exercise per Week:   . Minutes of Exercise per Session:   Stress:   . Feeling of  Stress :   Social Connections:   . Frequency of Communication with Friends and Family:   . Frequency of Social Gatherings with Friends and Family:   . Attends Religious Services:   . Active Member of Clubs or Organizations:   . Attends Archivist Meetings:   Marland Kitchen Marital Status:      Family History: The patient's family history includes Cancer in his daughter; Cancer - Lung in his brother; Diabetes in his father and mother; Gait disorder in his brother; Stroke in his sister. There is no history of Heart attack.  ROS:   Please  see the history of present illness.    Nausea without obvious precipitants.  Last episode was last evening.  Decreased memory.  Decreased mobility.  All other systems reviewed and are negative.  EKGs/Labs/Other Studies Reviewed:    The following studies were reviewed today: 2D Doppler echocardiogram 2019: Study Conclusions   - Left ventricle: The cavity size was normal. Wall thickness was  increased in a pattern of mild LVH. Systolic function was normal.  The estimated ejection fraction was in the range of 55% to 60%.  Left ventricular diastolic function parameters were normal.  - Aortic valve: Gradient similar to 12/2015 when mean gradient 18  mmHg and peak 28 mmHg. There was mild to moderate stenosis. Valve  area (VTI): 1.29 cm^2. Valve area (Vmax): 1.23 cm^2. Valve area  (Vmean): 1.21 cm^2.  - Mitral valve: Calcified annulus. Moderately thickened, moderately  calcified leaflets . Valve area by pressure half-time: 2.24 cm^2.  - Left atrium: The atrium was mildly dilated.  - Atrial septum: No defect or patent foramen ovale was identified.   EKG:  EKG sinus bradycardia, 55 bpm, and otherwise normal.  When compared to the prior tracing from August 2020, this slightly faster.  Recent Labs: 01/31/2020: ALT 21; BUN 23; Creatinine, Ser 1.50; Hemoglobin 14.9; Platelets 198; Potassium 5.0; Sodium 140; TSH 1.100  Recent Lipid Panel      Component Value Date/Time   CHOL 100 12/27/2014 0916   TRIG 67.0 12/27/2014 0916   HDL 29.50 (L) 12/27/2014 0916   CHOLHDL 3 12/27/2014 0916   VLDL 13.4 12/27/2014 0916   LDLCALC 57 12/27/2014 0916    Physical Exam:    VS:  BP 128/66   Pulse 55   Ht 5\' 7"  (1.702 m)   Wt 168 lb 6.4 oz (76.4 kg)   SpO2 95%   BMI 26.38 kg/m     Wt Readings from Last 3 Encounters:  04/06/20 168 lb 6.4 oz (76.4 kg)  01/31/20 173 lb (78.5 kg)  12/28/19 181 lb (82.1 kg)     GEN: Elderly, frail.. No acute distress HEENT: Normal NECK: No JVD. LYMPHATICS: No lymphadenopathy CARDIAC: 3/6 crescendo decrescendo murmur of aortic stenosis right upper sternal border. RRR without diastolic murmur, gallop, or edema. VASCULAR:  Normal Pulses. No bruits. RESPIRATORY:  Clear to auscultation without rales, wheezing or rhonchi  ABDOMEN: Soft, non-tender, non-distended, No pulsatile mass, MUSCULOSKELETAL: No deformity  SKIN: Warm and dry NEUROLOGIC:  Alert and oriented x 3 PSYCHIATRIC:  Normal affect   ASSESSMENT:    1. Coronary artery disease involving native coronary artery of native heart without angina pectoris   2. History of syncope   3. Paroxysmal atrial fibrillation (H[facility])   4. Essential hypertension   5. Hyperlipidemia, unspecified hyperlipidemia type   6. Aortic valve stenosis, etiology of cardiac valve disease unspecified   7. Educated about COVID-19 virus infection    PLAN:    In order of problems listed above:  1. Asymptomatic relative to angina.  Discontinue isosorbide mononitrate.  This will help improve blood pressure.  Use sublingual nitroglycerin while sitting or lying to treat episodes of chest discomfort.  Notify us if chest discomfort occurs. 2. Recurrent syncope could be related to relatively low blood pressures.  Isosorbide is being discontinued.  Target blood pressure should be 140/80 mmHg.  If blood pressure remains less than 130 mmHg after several weeks will decrease Norvasc to  2.5 mg/day and or discontinue it. 3. No evidence of atrial fibrillation on current EKG 4. Please  see dialogue above under #2 relative to goals for blood pressure.  We are weaning medications that could lower blood pressure and hence increase the likelihood of having episodes of fainting that could cause injury. 5. Continue Lipitor 40 mg/day. 6. Aortic stenosis is present and is probably worse than mild which it was in 2019.  Will follow clinically.  Not a candidate for invasive therapies given dementia. 7. COVID-19 vaccine has been received.  Did not develop Covid during the pandemic.   Medication Adjustments/Labs and Tests Ordered: Current medicines are reviewed at length with the patient today.  Concerns regarding medicines are outlined above.  Orders Placed This Encounter  Procedures  . EKG 12-Lead   Meds ordered this encounter  Medications  . nitroGLYCERIN (NITROSTAT) 0.4 MG SL tablet    Sig: Place 1 tablet (0.4 mg total) under the tongue every 5 (five) minutes as needed for chest pain.    Dispense:  25 tablet    Refill:  2    There are no Patient Instructions on file for this visit.   Signed, Sinclair Grooms, MD  04/06/2020 3:53 PM    [place]

## 2020-04-06 ENCOUNTER — Other Ambulatory Visit: Payer: Self-pay

## 2020-04-06 ENCOUNTER — Ambulatory Visit (INDEPENDENT_AMBULATORY_CARE_PROVIDER_SITE_OTHER): Payer: Medicare Other | Admitting: Interventional Cardiology

## 2020-04-06 ENCOUNTER — Encounter: Payer: Self-pay | Admitting: Interventional Cardiology

## 2020-04-06 VITALS — BP 128/66 | HR 55 | Ht 67.0 in | Wt 168.4 lb

## 2020-04-06 DIAGNOSIS — E785 Hyperlipidemia, unspecified: Secondary | ICD-10-CM | POA: Diagnosis not present

## 2020-04-06 DIAGNOSIS — Z87898 Personal history of other specified conditions: Secondary | ICD-10-CM | POA: Diagnosis not present

## 2020-04-06 DIAGNOSIS — I480 Paroxysmal atrial fibrillation: Secondary | ICD-10-CM

## 2020-04-06 DIAGNOSIS — I10 Essential (primary) hypertension: Secondary | ICD-10-CM | POA: Diagnosis not present

## 2020-04-06 DIAGNOSIS — I2510 Atherosclerotic heart disease of native coronary artery without angina pectoris: Secondary | ICD-10-CM | POA: Diagnosis not present

## 2020-04-06 DIAGNOSIS — Z7189 Other specified counseling: Secondary | ICD-10-CM

## 2020-04-06 DIAGNOSIS — I350 Nonrheumatic aortic (valve) stenosis: Secondary | ICD-10-CM | POA: Diagnosis not present

## 2020-04-06 MED ORDER — NITROGLYCERIN 0.4 MG SL SUBL: 0.4000 mg | SUBLINGUAL_TABLET | SUBLINGUAL | 2 refills | Status: AC | PRN

## 2020-04-06 NOTE — Patient Instructions (Signed)
Medication Instructions:  1) DISCONTINUE Isosorbide.  Monitor your blood pressure.  After two weeks, if you are consistently lower than 130 on the top number, call me and let me know.  Your goal blood pressure is 140/80 or higher.     *If you need a refill on your cardiac medications before your next appointment, please call your pharmacy*   Lab Work: None  If you have labs (blood work) drawn today and your tests are completely normal, you will receive your results only by: Marland Kitchen MyChart Message (if you have MyChart) OR . A paper copy in the mail If you have any lab test that is abnormal or we need to change your treatment, we will call you to review the results.   Testing/Procedures: None   Follow-Up: At [facility], you and your health needs are our priority.  As part of our continuing mission to provide you with exceptional heart care, we have created designated Provider Care Teams.  These Care Teams include your primary Cardiologist (physician) and Advanced Practice Providers (APPs -  Physician Assistants and Nurse Practitioners) who all work together to provide you with the care you need, when you need it.  We recommend signing up for the patient portal called "MyChart".  Sign up information is provided on this After Visit Summary.  MyChart is used to connect with patients for Virtual Visits (Telemedicine).  Patients are able to view lab/test results, encounter notes, upcoming appointments, etc.  Non-urgent messages can be sent to your provider as well.   To learn more about what you can do with MyChart, go to [email or web address].    Your next appointment:   4 month(s)  The format for your next appointment:   In Person  Provider:   You may see Sinclair Grooms, MD or one of the following Advanced Practice Providers on your designated Care Team:    Truitt Merle, NP  Cecilie Kicks, NP  Kathyrn Drown, NP    Other Instructions

## 2020-05-28 ENCOUNTER — Other Ambulatory Visit: Payer: Self-pay | Admitting: Physician Assistant

## 2020-05-28 NOTE — Telephone Encounter (Signed)
Eliquis 2.5mg  refill request received. Patient is 86 years old, weight-76.4kg, Crea-1.50 on 01/31/2020, Diagnosis-Afib, and last seen by Dr. Tamala Julian on  04/06/2020. Dose is appropriate based on dosing criteria. Will send in refill to requested pharmacy.

## 2020-06-07 DIAGNOSIS — I10 Essential (primary) hypertension: Secondary | ICD-10-CM | POA: Diagnosis not present

## 2020-06-07 DIAGNOSIS — E7800 Pure hypercholesterolemia, unspecified: Secondary | ICD-10-CM | POA: Diagnosis not present

## 2020-06-07 DIAGNOSIS — E785 Hyperlipidemia, unspecified: Secondary | ICD-10-CM | POA: Diagnosis not present

## 2020-06-07 DIAGNOSIS — I639 Cerebral infarction, unspecified: Secondary | ICD-10-CM | POA: Diagnosis not present

## 2020-06-07 DIAGNOSIS — F0390 Unspecified dementia without behavioral disturbance: Secondary | ICD-10-CM | POA: Diagnosis not present

## 2020-06-07 DIAGNOSIS — I480 Paroxysmal atrial fibrillation: Secondary | ICD-10-CM | POA: Diagnosis not present

## 2020-06-07 DIAGNOSIS — G459 Transient cerebral ischemic attack, unspecified: Secondary | ICD-10-CM | POA: Diagnosis not present

## 2020-06-07 DIAGNOSIS — N183 Chronic kidney disease, stage 3 unspecified: Secondary | ICD-10-CM | POA: Diagnosis not present

## 2020-06-07 DIAGNOSIS — I2510 Atherosclerotic heart disease of native coronary artery without angina pectoris: Secondary | ICD-10-CM | POA: Diagnosis not present

## 2020-06-07 DIAGNOSIS — N400 Enlarged prostate without lower urinary tract symptoms: Secondary | ICD-10-CM | POA: Diagnosis not present

## 2020-06-07 DIAGNOSIS — F322 Major depressive disorder, single episode, severe without psychotic features: Secondary | ICD-10-CM | POA: Diagnosis not present

## 2020-06-12 DIAGNOSIS — I2510 Atherosclerotic heart disease of native coronary artery without angina pectoris: Secondary | ICD-10-CM | POA: Diagnosis not present

## 2020-06-12 DIAGNOSIS — S32599A Other specified fracture of unspecified pubis, initial encounter for closed fracture: Secondary | ICD-10-CM | POA: Diagnosis not present

## 2020-06-12 DIAGNOSIS — I951 Orthostatic hypotension: Secondary | ICD-10-CM | POA: Diagnosis not present

## 2020-06-12 DIAGNOSIS — I4891 Unspecified atrial fibrillation: Secondary | ICD-10-CM | POA: Diagnosis not present

## 2020-06-12 DIAGNOSIS — J690 Pneumonitis due to inhalation of food and vomit: Secondary | ICD-10-CM | POA: Diagnosis not present

## 2020-06-12 DIAGNOSIS — I350 Nonrheumatic aortic (valve) stenosis: Secondary | ICD-10-CM | POA: Diagnosis not present

## 2020-06-28 ENCOUNTER — Inpatient Hospital Stay (HOSPITAL_COMMUNITY)
Admission: EM | Admit: 2020-06-28 | Discharge: 2020-07-04 | DRG: 312 | Disposition: A | Discharge status: Skilled Nursing Facility | Payer: Medicare Other | Attending: Internal Medicine | Admitting: Internal Medicine

## 2020-06-28 ENCOUNTER — Emergency Department (HOSPITAL_COMMUNITY): Payer: Medicare Other

## 2020-06-28 ENCOUNTER — Encounter (HOSPITAL_COMMUNITY): Payer: Self-pay

## 2020-06-28 ENCOUNTER — Observation Stay (HOSPITAL_COMMUNITY): Payer: Medicare Other

## 2020-06-28 DIAGNOSIS — S3282XA Multiple fractures of pelvis without disruption of pelvic ring, initial encounter for closed fracture: Secondary | ICD-10-CM | POA: Diagnosis not present

## 2020-06-28 DIAGNOSIS — Z8619 Personal history of other infectious and parasitic diseases: Secondary | ICD-10-CM

## 2020-06-28 DIAGNOSIS — M4312 Spondylolisthesis, cervical region: Secondary | ICD-10-CM | POA: Diagnosis not present

## 2020-06-28 DIAGNOSIS — F0390 Unspecified dementia without behavioral disturbance: Secondary | ICD-10-CM | POA: Diagnosis present

## 2020-06-28 DIAGNOSIS — S32512A Fracture of superior rim of left pubis, initial encounter for closed fracture: Secondary | ICD-10-CM | POA: Diagnosis present

## 2020-06-28 DIAGNOSIS — W19XXXA Unspecified fall, initial encounter: Secondary | ICD-10-CM | POA: Diagnosis not present

## 2020-06-28 DIAGNOSIS — K219 Gastro-esophageal reflux disease without esophagitis: Secondary | ICD-10-CM | POA: Diagnosis present

## 2020-06-28 DIAGNOSIS — S32599A Other specified fracture of unspecified pubis, initial encounter for closed fracture: Secondary | ICD-10-CM | POA: Diagnosis present

## 2020-06-28 DIAGNOSIS — S2242XA Multiple fractures of ribs, left side, initial encounter for closed fracture: Secondary | ICD-10-CM | POA: Diagnosis not present

## 2020-06-28 DIAGNOSIS — S199XXA Unspecified injury of neck, initial encounter: Secondary | ICD-10-CM | POA: Diagnosis not present

## 2020-06-28 DIAGNOSIS — E440 Moderate protein-calorie malnutrition: Secondary | ICD-10-CM | POA: Insufficient documentation

## 2020-06-28 DIAGNOSIS — R52 Pain, unspecified: Secondary | ICD-10-CM | POA: Diagnosis not present

## 2020-06-28 DIAGNOSIS — Z882 Allergy status to sulfonamides status: Secondary | ICD-10-CM

## 2020-06-28 DIAGNOSIS — Z79899 Other long term (current) drug therapy: Secondary | ICD-10-CM

## 2020-06-28 DIAGNOSIS — E876 Hypokalemia: Secondary | ICD-10-CM | POA: Diagnosis present

## 2020-06-28 DIAGNOSIS — I350 Nonrheumatic aortic (valve) stenosis: Secondary | ICD-10-CM | POA: Diagnosis present

## 2020-06-28 DIAGNOSIS — I10 Essential (primary) hypertension: Secondary | ICD-10-CM | POA: Diagnosis not present

## 2020-06-28 DIAGNOSIS — Z66 Do not resuscitate: Secondary | ICD-10-CM | POA: Diagnosis present

## 2020-06-28 DIAGNOSIS — R55 Syncope and collapse: Secondary | ICD-10-CM

## 2020-06-28 DIAGNOSIS — J189 Pneumonia, unspecified organism: Secondary | ICD-10-CM | POA: Diagnosis not present

## 2020-06-28 DIAGNOSIS — D72829 Elevated white blood cell count, unspecified: Secondary | ICD-10-CM | POA: Diagnosis present

## 2020-06-28 DIAGNOSIS — S42002A Fracture of unspecified part of left clavicle, initial encounter for closed fracture: Secondary | ICD-10-CM | POA: Diagnosis not present

## 2020-06-28 DIAGNOSIS — M7989 Other specified soft tissue disorders: Secondary | ICD-10-CM | POA: Diagnosis not present

## 2020-06-28 DIAGNOSIS — Z20822 Contact with and (suspected) exposure to covid-19: Secondary | ICD-10-CM | POA: Diagnosis not present

## 2020-06-28 DIAGNOSIS — G4733 Obstructive sleep apnea (adult) (pediatric): Secondary | ICD-10-CM | POA: Diagnosis present

## 2020-06-28 DIAGNOSIS — J9601 Acute respiratory failure with hypoxia: Secondary | ICD-10-CM

## 2020-06-28 DIAGNOSIS — E860 Dehydration: Secondary | ICD-10-CM | POA: Diagnosis present

## 2020-06-28 DIAGNOSIS — S41112A Laceration without foreign body of left upper arm, initial encounter: Secondary | ICD-10-CM | POA: Diagnosis present

## 2020-06-28 DIAGNOSIS — M25519 Pain in unspecified shoulder: Secondary | ICD-10-CM | POA: Diagnosis not present

## 2020-06-28 DIAGNOSIS — I480 Paroxysmal atrial fibrillation: Secondary | ICD-10-CM

## 2020-06-28 DIAGNOSIS — W07XXXA Fall from chair, initial encounter: Secondary | ICD-10-CM | POA: Diagnosis present

## 2020-06-28 DIAGNOSIS — I493 Ventricular premature depolarization: Secondary | ICD-10-CM | POA: Diagnosis present

## 2020-06-28 DIAGNOSIS — I679 Cerebrovascular disease, unspecified: Secondary | ICD-10-CM | POA: Diagnosis present

## 2020-06-28 DIAGNOSIS — S0001XA Abrasion of scalp, initial encounter: Secondary | ICD-10-CM | POA: Diagnosis present

## 2020-06-28 DIAGNOSIS — I951 Orthostatic hypotension: Secondary | ICD-10-CM | POA: Diagnosis not present

## 2020-06-28 DIAGNOSIS — J3489 Other specified disorders of nose and nasal sinuses: Secondary | ICD-10-CM | POA: Diagnosis not present

## 2020-06-28 DIAGNOSIS — G909 Disorder of the autonomic nervous system, unspecified: Secondary | ICD-10-CM | POA: Diagnosis present

## 2020-06-28 DIAGNOSIS — Z23 Encounter for immunization: Secondary | ICD-10-CM | POA: Diagnosis not present

## 2020-06-28 DIAGNOSIS — Z8701 Personal history of pneumonia (recurrent): Secondary | ICD-10-CM

## 2020-06-28 DIAGNOSIS — Z880 Allergy status to penicillin: Secondary | ICD-10-CM

## 2020-06-28 DIAGNOSIS — N1831 Chronic kidney disease, stage 3a: Secondary | ICD-10-CM | POA: Diagnosis not present

## 2020-06-28 DIAGNOSIS — I2510 Atherosclerotic heart disease of native coronary artery without angina pectoris: Secondary | ICD-10-CM | POA: Diagnosis not present

## 2020-06-28 DIAGNOSIS — S5002XA Contusion of left elbow, initial encounter: Secondary | ICD-10-CM | POA: Diagnosis present

## 2020-06-28 DIAGNOSIS — R2681 Unsteadiness on feet: Secondary | ICD-10-CM | POA: Diagnosis present

## 2020-06-28 DIAGNOSIS — I700 Atherosclerosis of aorta: Secondary | ICD-10-CM | POA: Diagnosis not present

## 2020-06-28 DIAGNOSIS — S40022A Contusion of left upper arm, initial encounter: Secondary | ICD-10-CM | POA: Diagnosis not present

## 2020-06-28 DIAGNOSIS — N210 Calculus in bladder: Secondary | ICD-10-CM | POA: Diagnosis not present

## 2020-06-28 DIAGNOSIS — M79605 Pain in left leg: Secondary | ICD-10-CM | POA: Diagnosis not present

## 2020-06-28 DIAGNOSIS — Y92091 Bathroom in other non-institutional residence as the place of occurrence of the external cause: Secondary | ICD-10-CM

## 2020-06-28 DIAGNOSIS — I471 Supraventricular tachycardia: Secondary | ICD-10-CM | POA: Diagnosis present

## 2020-06-28 DIAGNOSIS — Y93E1 Activity, personal bathing and showering: Secondary | ICD-10-CM

## 2020-06-28 DIAGNOSIS — Z8673 Personal history of transient ischemic attack (TIA), and cerebral infarction without residual deficits: Secondary | ICD-10-CM

## 2020-06-28 DIAGNOSIS — M778 Other enthesopathies, not elsewhere classified: Secondary | ICD-10-CM | POA: Diagnosis not present

## 2020-06-28 DIAGNOSIS — Z7901 Long term (current) use of anticoagulants: Secondary | ICD-10-CM

## 2020-06-28 DIAGNOSIS — I252 Old myocardial infarction: Secondary | ICD-10-CM

## 2020-06-28 DIAGNOSIS — Z9119 Patient's noncompliance with other medical treatment and regimen: Secondary | ICD-10-CM

## 2020-06-28 DIAGNOSIS — S0990XA Unspecified injury of head, initial encounter: Secondary | ICD-10-CM | POA: Diagnosis not present

## 2020-06-28 DIAGNOSIS — R296 Repeated falls: Secondary | ICD-10-CM | POA: Diagnosis present

## 2020-06-28 DIAGNOSIS — Z888 Allergy status to other drugs, medicaments and biological substances status: Secondary | ICD-10-CM

## 2020-06-28 DIAGNOSIS — Q283 Other malformations of cerebral vessels: Secondary | ICD-10-CM | POA: Diagnosis not present

## 2020-06-28 DIAGNOSIS — M25562 Pain in left knee: Secondary | ICD-10-CM | POA: Diagnosis not present

## 2020-06-28 DIAGNOSIS — I130 Hypertensive heart and chronic kidney disease with heart failure and stage 1 through stage 4 chronic kidney disease, or unspecified chronic kidney disease: Secondary | ICD-10-CM | POA: Diagnosis present

## 2020-06-28 DIAGNOSIS — E785 Hyperlipidemia, unspecified: Secondary | ICD-10-CM | POA: Diagnosis present

## 2020-06-28 DIAGNOSIS — Z823 Family history of stroke: Secondary | ICD-10-CM

## 2020-06-28 DIAGNOSIS — M25552 Pain in left hip: Secondary | ICD-10-CM | POA: Diagnosis not present

## 2020-06-28 LAB — COMPREHENSIVE METABOLIC PANEL
ALT: 20 U/L (ref 0–44)
AST: 25 U/L (ref 15–41)
Albumin: 3.2 g/dL — ABNORMAL LOW (ref 3.5–5.0)
Alkaline Phosphatase: 73 U/L (ref 38–126)
Anion gap: 10 (ref 5–15)
BUN: 25 mg/dL — ABNORMAL HIGH (ref 8–23)
CO2: 21 mmol/L — ABNORMAL LOW (ref 22–32)
Calcium: 8.5 mg/dL — ABNORMAL LOW (ref 8.9–10.3)
Chloride: 110 mmol/L (ref 98–111)
Creatinine, Ser: 1.22 mg/dL (ref 0.61–1.24)
GFR, Estimated: 56 mL/min — ABNORMAL LOW (ref >60)
Glucose, Bld: 110 mg/dL — ABNORMAL HIGH (ref 70–99)
Potassium: 3.8 mmol/L (ref 3.5–5.1)
Sodium: 141 mmol/L (ref 135–145)
Total Bilirubin: 0.8 mg/dL (ref 0.3–1.2)
Total Protein: 5.6 g/dL — ABNORMAL LOW (ref 6.5–8.1)

## 2020-06-28 LAB — URINALYSIS, COMPLETE (UACMP) WITH MICROSCOPIC
Bilirubin Urine: NEGATIVE
Glucose, UA: NEGATIVE mg/dL
Hgb urine dipstick: NEGATIVE
Ketones, ur: 20 mg/dL — AB
Leukocytes,Ua: NEGATIVE
Nitrite: NEGATIVE
Protein, ur: 100 mg/dL — AB
Specific Gravity, Urine: 1.018 (ref 1.005–1.030)
pH: 5 (ref 5.0–8.0)

## 2020-06-28 LAB — CBC WITH DIFFERENTIAL/PLATELET
Abs Immature Granulocytes: 0.13 10*3/uL — ABNORMAL HIGH (ref 0.00–0.07)
Basophils Absolute: 0.1 10*3/uL (ref 0.0–0.1)
Basophils Relative: 0 %
Eosinophils Absolute: 0.1 10*3/uL (ref 0.0–0.5)
Eosinophils Relative: 0 %
HCT: 44.2 % (ref 39.0–52.0)
Hemoglobin: 14.6 g/dL (ref 13.0–17.0)
Immature Granulocytes: 1 %
Lymphocytes Relative: 10 %
Lymphs Abs: 1.4 10*3/uL (ref 0.7–4.0)
MCH: 30 pg (ref 26.0–34.0)
MCHC: 33 g/dL (ref 30.0–36.0)
MCV: 90.9 fL (ref 80.0–100.0)
Monocytes Absolute: 0.8 10*3/uL (ref 0.1–1.0)
Monocytes Relative: 6 %
Neutro Abs: 11.6 10*3/uL — ABNORMAL HIGH (ref 1.7–7.7)
Neutrophils Relative %: 83 %
Platelets: 215 10*3/uL (ref 150–400)
RBC: 4.86 MIL/uL (ref 4.22–5.81)
RDW: 13.6 % (ref 11.5–15.5)
WBC: 14 10*3/uL — ABNORMAL HIGH (ref 4.0–10.5)
nRBC: 0 % (ref 0.0–0.2)

## 2020-06-28 LAB — BRAIN NATRIURETIC PEPTIDE: B Natriuretic Peptide: 133.2 pg/mL — ABNORMAL HIGH (ref 0.0–100.0)

## 2020-06-28 LAB — RESPIRATORY PANEL BY RT PCR (FLU A&B, COVID)
Influenza A by PCR: NEGATIVE
Influenza B by PCR: NEGATIVE
SARS Coronavirus 2 by RT PCR: NEGATIVE

## 2020-06-28 LAB — MAGNESIUM: Magnesium: 1.5 mg/dL — ABNORMAL LOW (ref 1.7–2.4)

## 2020-06-28 LAB — PROTIME-INR
INR: 1.2 (ref 0.8–1.2)
Prothrombin Time: 14.7 seconds (ref 11.4–15.2)

## 2020-06-28 LAB — TROPONIN I (HIGH SENSITIVITY)
Troponin I (High Sensitivity): 9 ng/L (ref <18)
Troponin I (High Sensitivity): 15 ng/L (ref <18)

## 2020-06-28 MED ORDER — AMLODIPINE BESYLATE 5 MG PO TABS
5.0000 mg | ORAL_TABLET | Freq: Every day | ORAL | Status: DC
  Administered 2020-06-28 – 2020-06-29 (×2): 5 mg via ORAL
  Filled 2020-06-28 (×2): qty 1

## 2020-06-28 MED ORDER — NITROGLYCERIN 0.4 MG SL SUBL: 0.4000 mg | SUBLINGUAL_TABLET | SUBLINGUAL | Status: DC | PRN

## 2020-06-28 MED ORDER — PANTOPRAZOLE SODIUM 40 MG PO TBEC
40.0000 mg | DELAYED_RELEASE_TABLET | Freq: Every day | ORAL | Status: DC
  Administered 2020-06-29 – 2020-07-04 (×6): 40 mg via ORAL
  Filled 2020-06-28 (×6): qty 1

## 2020-06-28 MED ORDER — MAGNESIUM SULFATE 2 GM/50ML IV SOLN
2.0000 g | Freq: Once | INTRAVENOUS | Status: AC
  Administered 2020-06-28: 2 g via INTRAVENOUS
  Filled 2020-06-28: qty 50

## 2020-06-28 MED ORDER — ONDANSETRON HCL 4 MG PO TABS: 4.0000 mg | ORAL_TABLET | Freq: Four times a day (QID) | ORAL | Status: DC | PRN

## 2020-06-28 MED ORDER — SODIUM CHLORIDE 0.9 % IV SOLN: INTRAVENOUS | Status: AC

## 2020-06-28 MED ORDER — ONDANSETRON HCL 4 MG/2ML IJ SOLN: 4.0000 mg | Freq: Four times a day (QID) | INTRAMUSCULAR | Status: DC | PRN

## 2020-06-28 MED ORDER — ISOSORBIDE MONONITRATE ER 30 MG PO TB24
30.0000 mg | ORAL_TABLET | Freq: Every day | ORAL | Status: DC
  Administered 2020-06-29 – 2020-07-02 (×4): 30 mg via ORAL
  Filled 2020-06-28 (×4): qty 1

## 2020-06-28 MED ORDER — POLYETHYLENE GLYCOL 3350 17 G PO PACK: 17.0000 g | PACK | Freq: Every day | ORAL | Status: DC | PRN

## 2020-06-28 MED ORDER — ACETAMINOPHEN 650 MG RE SUPP: 650.0000 mg | Freq: Four times a day (QID) | RECTAL | Status: DC | PRN

## 2020-06-28 MED ORDER — ATORVASTATIN CALCIUM 40 MG PO TABS
40.0000 mg | ORAL_TABLET | Freq: Every evening | ORAL | Status: DC
  Administered 2020-06-28 – 2020-07-03 (×6): 40 mg via ORAL
  Filled 2020-06-28 (×6): qty 1

## 2020-06-28 MED ORDER — METOPROLOL SUCCINATE ER 25 MG PO TB24
25.0000 mg | ORAL_TABLET | Freq: Every day | ORAL | Status: DC
  Administered 2020-06-29 – 2020-07-02 (×4): 25 mg via ORAL
  Filled 2020-06-28 (×5): qty 1

## 2020-06-28 MED ORDER — DONEPEZIL HCL 10 MG PO TABS
10.0000 mg | ORAL_TABLET | Freq: Every day | ORAL | Status: DC
  Administered 2020-06-28 – 2020-07-03 (×5): 10 mg via ORAL
  Filled 2020-06-28 (×6): qty 1

## 2020-06-28 MED ORDER — ACETAMINOPHEN 325 MG PO TABS
650.0000 mg | ORAL_TABLET | Freq: Four times a day (QID) | ORAL | Status: DC | PRN
  Administered 2020-06-29: 650 mg via ORAL
  Filled 2020-06-28: qty 2

## 2020-06-28 NOTE — H&P (Signed)
History and Physical    Derek [name] LPF:[id number] DOB: 1927/07/12 DOA: 06/28/2020  PCP: Seward Carol, MD  Patient coming from: Home   Chief Complaint:  Chief Complaint  Patient presents with  . Fall     HPI:    86 year old male with past medical history of paroxysmal atrial fibrillation (on Eliquis), coronary artery disease (S/P BMS to CFx in 2014), mild/moderate aortic stenosis (valve area 1.29cm^2 Echo 12/2017), dementia, chronic kidney disease stage IIIa, obstructive sleep apnea (not compliant with CPAP), hyperlipidemia ,hypertension and gastroesophageal reflux disease who presents to [facility] emergency department status post loss of consciousness and fall.  Patient is an extremely poor historian secondary to dementia and therefore the majority the history is been obtained from the son who is at the bedside.  Patient's son explains that the patient has been suffering from episodic loss of loss of consciousness and falls for at least the past 2 years.  Typically, patient experiences an episode every several weeks.  Patient has been following with his cardiologist concerning this, prompting numerous adjustments of his antihypertensives and rate controlling therapies.  Last visit with his cardiologist was on July 30.  The son explains that in the past several months the patient has exhibited gradually worsening generalized weakness associated with unsteady gait and progressively worsening confusion.  Early this morning, while the patient was sitting on his shower chair, he experienced an unwitnessed episode of loss of consciousness and fall.  When the son ran into the bathroom it became apparent that the patient had hit his head on the side of the tub without any apparent injury.  Later in the day, as the patient was leaving the barbershop and walking to his son's car patient experienced another episode of loss of consciousness and fall, hitting his head on an embankment  nearby.  During the fall the patient suffered multiple injuries including an abrasion to the scalp as well as a substantial skin tear to the left arm.  Because of these injuries and the increasing frequency of these episodes patient was brought in by EMS to [facility] emergency department for further evaluation.  Of note, the son does endorse a 1 month history of intermittently productive cough with clear to yellow sputum.  There is no associated chest pain or shortness of breath.  The son denies that the patient has had any recent fevers, nausea, vomiting, sick contacts, dysuria, abdominal pain or diarrhea.  Upon evaluation in the emergency department, due to the increasing frequencies of these episodes hospitalist group was called to assess the patient for admission to the hospital.  Review of Systems:   ROS  Past Medical History:  Diagnosis Date  . Broken shoulder   . Cerebrovascular disease    MRI/MRA in 2007 - 50% stenosis of supraclinoid ICA, left  . Clavicle fracture   . Closed wedge compression fracture of T1 vertebra ([place]) 02/20/2018  . Complex sleep apnea syndrome    adapt SV titration EEP 6 min 6 and max 15 cm water.   . Compression fracture   . Coronary atherosclerosis of native coronary artery 07/12/2013   a. NSTEMI in setting of AF with RVR 11/14 >> BMS to CFX;  b. Nuclear (9/15):  High risk - apical lateral ischemia >> c. LHC (9/15):  pLAD 50%, D1 (smaller br 90%/larger br 80%, pDx 50-60%, dLAD 50%, prox-mid CFX stent ok, pOM1 and OM2 50-70% (jailed by stent), OM5 70-80%, mPDA 90% (CFX dsz unchanged from 2014), RCA  diff dsz, EF 55%, mild ant-apical HK >>  Med Rx  . CVA (cerebral vascular accident) ([place])   . Dementia ([place])   . History of pneumonia   . Hx of echocardiogram    a. Echo 11/14): EF 55-60%, no RWMA, grade 2 DD, mild aortic stenosis (mean 15 mmHg), mild LAE  . Hypertension   . Lung nodule < 6cm on CT 02/20/2018  . MI (myocardial infarction) ([place]) 07/2013  .  PAF (paroxysmal atrial fibrillation) (HCC)    hx of NSTEMI in setting of AF with RVR 07/2013;  Eliquis for AC  . Shingles    left chest  . Syncope   . TIA (transient ischemic attack) 07/12/2013    Past Surgical History:  Procedure Laterality Date  . CATARACT EXTRACTION Bilateral   . KYPHOPLASTY    . LEFT HEART CATHETERIZATION WITH CORONARY ANGIOGRAM N/A 08/08/2013   Procedure: LEFT HEART CATHETERIZATION WITH CORONARY ANGIOGRAM;  Surgeon: Jettie Booze, MD;  Location: [facility] CATH LAB;  Service: Cardiovascular;  Laterality: N/A;  . LEFT HEART CATHETERIZATION WITH CORONARY ANGIOGRAM N/A 05/29/2014   Procedure: LEFT HEART CATHETERIZATION WITH CORONARY ANGIOGRAM;  Surgeon: Sinclair Grooms, MD;  Location: [facility] CATH LAB;  Service: Cardiovascular;  Laterality: N/A;  . PERCUTANEOUS STENT INTERVENTION  08/08/2013   Procedure: PERCUTANEOUS STENT INTERVENTION;  Surgeon: Jettie Booze, MD;  Location: [facility] CATH LAB;  Service: Cardiovascular;;  . SHOULDER SURGERY Left   . TONSILLECTOMY       reports that he has never smoked. He has never used smokeless tobacco. He reports current alcohol use of about 1.0 standard drink of alcohol per week. He reports that he does not use drugs.  Allergies  Allergen Reactions  . Augmentin [Amoxicillin-Pot Clavulanate] Rash  . Keppra [Levetiracetam]     Trouble with walking, fatigued  . Penicillins Rash    Has patient had a PCN reaction causing immediate rash, facial/tongue/throat swelling, SOB or lightheadedness with hypotension: Yes Has patient had a PCN reaction causing severe rash involving mucus membranes or skin necrosis: Unk Has patient had a PCN reaction that required hospitalization: Yes Has patient had a PCN reaction occurring within the last 10 years: No If all of the above answers are "NO", then may proceed with Cephalosporin use.   . Sulfa Antibiotics Rash    Family History  Problem Relation Age of Onset  . Diabetes Mother   . Diabetes Father    . Cancer - Lung Brother   . Gait disorder Brother   . Cancer Daughter   . Stroke Sister   . Heart attack Neg Hx      Prior to Admission medications   Medication Sig Start Date End Date Taking? Authorizing Provider  amLODipine (NORVASC) 5 MG tablet Take 1 tablet (5 mg total) by mouth daily. 01/31/20   Imogene Burn, PA-C  atorvastatin (LIPITOR) 40 MG tablet TAKE 1 TABLET ONCE DAILY AT 6 PM. 05/28/20   Imogene Burn, PA-C  cholecalciferol (VITAMIN D) 1000 UNITS tablet Take 1,000 Units by mouth daily.    [provider]  donepezil (ARICEPT) 10 MG tablet Take 1 tablet (10 mg total) by mouth at bedtime. 12/28/19   Suzzanne Cloud, NP  ELIQUIS 2.5 MG TABS tablet TAKE 1 TABLET BY MOUTH TWICE DAILY. 05/28/20   Belva Crome, MD  isosorbide mononitrate (IMDUR) 30 MG 24 hr tablet TAKE 1 TABLET ONCE DAILY. 05/28/20   Imogene Burn, PA-C  metoprolol succinate (TOPROL-XL) 25 MG 24  hr tablet Take 1 tablet (25 mg total) by mouth daily. Take with or immediately following a meal. 01/31/20   Imogene Burn, PA-C  Multiple Vitamins-Minerals (CENTRUM SILVER PO) Take 1 tablet by mouth daily.     [provider]  nitroGLYCERIN (NITROSTAT) 0.4 MG SL tablet Place 1 tablet (0.4 mg total) under the tongue every 5 (five) minutes as needed for chest pain. 04/06/20   Belva Crome, MD  omeprazole (PRILOSEC) 20 MG capsule Take 20 mg by mouth daily before breakfast.     [provider]    Physical Exam: Vitals:   06/28/20 1954 06/28/20 2003 06/28/20 2005 06/28/20 2053  BP: (!) 150/89 (!) 153/82 (!) 169/82 (!) 173/74  Pulse: 82 81 90 93  Resp: 15   14  Temp: 97.9 F (36.6 C)     TempSrc: Oral     SpO2: 98%   98%  Weight:      Height:        Constitutional: Patient is lethargic but arousable, oriented x1, not in any acute distress. Skin: Notable poor skin turgor.  Substantial skin tear noted over the left elbow with associated ecchymosis.  Abrasion noted over the scalp.   Eyes:  Pupils are equally reactive to light.  No evidence of scleral icterus or conjunctival pallor.  ENMT: Dry mucous membranes noted.  Posterior pharynx clear of any exudate or lesions.   Neck: normal, supple, no masses, no thyromegaly.  No evidence of jugular venous distension.   Respiratory: clear to auscultation bilaterally, no wheezing, no crackles. Normal respiratory effort. No accessory muscle use.  Cardiovascular: 3/6 systolic murmur noted best over the aortic valve area.  Regular rate and rhythm, although she thinks any blood or soft dysphagia 1 difficulty with mastication was ordered this patient change the consistency no extremity edema. 2+ pedal pulses. No carotid bruits.  Chest:   Nontender without crepitus or deformity.   Back:   Nontender without crepitus or deformity. Abdomen: Abdomen is soft and nontender.  No evidence of intra-abdominal masses.  Positive bowel sounds noted in all quadrants.   Musculoskeletal: Pain with both passive and active range of motion of the left hip.  No obvious contractures or deformities noted. Normal muscle tone.  Neurologic: Patient intermittently following commands.  Patient notably confused throughout the interview and oriented x1.  Patient lethargic but arousable.  Patient responsive to verbal and painful stimuli.  Patient moving all 4 extremities spontaneously.   Psychiatric: Patient exhibits normal mood with appropriate affect.  Patient seems to possess insight as to their current situation.     Labs on Admission: I have personally reviewed following labs and imaging studies -   CBC: Recent Labs  Lab 06/28/20 1844  WBC 14.0*  NEUTROABS 11.6*  HGB 14.6  HCT 44.2  MCV 90.9  PLT 425   Basic Metabolic Panel: Recent Labs  Lab 06/28/20 1844  NA 141  K 3.8  CL 110  CO2 21*  GLUCOSE 110*  BUN 25*  CREATININE 1.22  CALCIUM 8.5*  MG 1.5*   GFR: Estimated Creatinine Clearance: 40.5 mL/min (by C-G formula based on SCr of 1.22 mg/dL). Liver  Function Tests: Recent Labs  Lab 06/28/20 1844  AST 25  ALT 20  ALKPHOS 73  BILITOT 0.8  PROT 5.6*  ALBUMIN 3.2*   No results for input(s): LIPASE, AMYLASE in the last 168 hours. No results for input(s): AMMONIA in the last 168 hours. Coagulation Profile: Recent Labs  Lab 06/28/20 1844  INR 1.2   Cardiac Enzymes: No results for input(s): CKTOTAL, CKMB, CKMBINDEX, TROPONINI in the last 168 hours. BNP (last 3 results) No results for input(s): PROBNP in the last 8760 hours. HbA1C: No results for input(s): HGBA1C in the last 72 hours. CBG: No results for input(s): GLUCAP in the last 168 hours. Lipid Profile: No results for input(s): CHOL, HDL, LDLCALC, TRIG, CHOLHDL, LDLDIRECT in the last 72 hours. Thyroid Function Tests: No results for input(s): TSH, T4TOTAL, FREET4, T3FREE, THYROIDAB in the last 72 hours. Anemia Panel: No results for input(s): VITAMINB12, FOLATE, FERRITIN, TIBC, IRON, RETICCTPCT in the last 72 hours. Urine analysis:    Component Value Date/Time   COLORURINE YELLOW 02/19/2018 2153   APPEARANCEUR CLEAR 02/19/2018 2153   LABSPEC 1.044 (H) 02/19/2018 2153   PHURINE 5.0 02/19/2018 2153   GLUCOSEU NEGATIVE 02/19/2018 2153   HGBUR NEGATIVE 02/19/2018 2153   [place] NEGATIVE 02/19/2018 2153   [place] NEGATIVE 02/19/2018 2153   PROTEINUR 30 (A) 02/19/2018 2153   UROBILINOGEN 0.2 12/11/2014 1450   NITRITE NEGATIVE 02/19/2018 2153   LEUKOCYTESUR NEGATIVE 02/19/2018 2153    Radiological Exams on Admission - Personally Reviewed: DG Knee 2 Views Left  Result Date: 06/28/2020 CLINICAL DATA:  Recent falls with left knee pain, initial encounter EXAM: LEFT KNEE - 2 VIEW COMPARISON:  None. FINDINGS: No evidence of fracture, dislocation, or joint effusion. No evidence of arthropathy or other focal bone abnormality. Soft tissues are unremarkable. IMPRESSION: No acute abnormality noted. Electronically Signed   By: Inez Catalina M.D.   On: 06/28/2020 17:56   CT  Head Wo Contrast  Result Date: 06/28/2020 CLINICAL DATA:  Head trauma, minor. Additional history provided: Falls. EXAM: CT HEAD WITHOUT CONTRAST CT CERVICAL SPINE WITHOUT CONTRAST TECHNIQUE: Multidetector CT imaging of the head and cervical spine was performed following the standard protocol without intravenous contrast. Multiplanar CT image reconstructions of the cervical spine were also generated. COMPARISON:  Brain MRI 02/20/2018. Head CT 02/19/2018. CT cervical spine 03/14/2012. FINDINGS: CT HEAD FINDINGS Brain: Moderate cerebral atrophy. Advanced ill-defined hypoattenuation within the cerebral white matter is nonspecific, but consistent with chronic small vessel ischemic disease. Known chronic lacunar infarcts within the deep gray nuclei, some of which were better appreciated on the prior MRI of 02/20/2018. There is no acute intracranial hemorrhage. No demarcated cortical infarct. No extra-axial fluid collection. No evidence of intracranial mass. No midline shift. Vascular: No hyperdense vessel.  Atherosclerotic calcifications. Skull: Normal. Negative for fracture or focal lesion. Sinuses/Orbits: Visualized orbits show no acute finding. Mild scattered paranasal sinus mucosal thickening. Small left maxillary sinus mucous retention cyst. Trace fluid within the left mastoid air cells. CT CERVICAL SPINE FINDINGS Alignment: Trace C2-C3 grade 1 retrolisthesis. Skull base and vertebrae: The basion-dental and atlanto-dental intervals are maintained.No evidence of acute fracture to the cervical spine. 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 Redemonstrated mild chronic T2 and T3 superior endplate deformities. Soft tissues and spinal canal: No prevertebral fluid or swelling. No visible canal hematoma. Disc levels: Cervical spondylosis. Most notably at C6-C7, there is advanced disc space narrowing with a prominent posterior disc osteophyte complex and left greater than right disc osteophyte  ridge/uncinate hypertrophy. There is moderate spinal canal stenosis at this level with left-sided bony neural foraminal narrowing. Upper chest: No consolidation within the imaged lung apices. No visible pneumothorax. Other: Known chronic fracture deformity of the distal left clavicle, partially imaged. IMPRESSION: CT head: 1. No evidence of acute intracranial abnormality. 2. Moderate cerebral atrophy and advanced chronic  small vessel ischemic disease, as described and stable as compared to the brain MRI of 02/20/2018. 3. Mild paranasal sinus mucosal thickening. CT cervical spine: 1. No evidence of acute fracture to the cervical spine. 2. Trace C2-C3 grade 1 retrolisthesis. 3. 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 4. Cervical spondylosis as described and greatest at C6-C7. 5. Redemonstrated mild chronic T2 and T3 superior endplate deformities. Electronically Signed   By: Kellie Simmering DO   On: 06/28/2020 18:27   CT Cervical Spine Wo Contrast  Result Date: 06/28/2020 CLINICAL DATA:  Head trauma, minor. Additional history provided: Falls. EXAM: CT HEAD WITHOUT CONTRAST CT CERVICAL SPINE WITHOUT CONTRAST TECHNIQUE: Multidetector CT imaging of the head and cervical spine was performed following the standard protocol without intravenous contrast. Multiplanar CT image reconstructions of the cervical spine were also generated. COMPARISON:  Brain MRI 02/20/2018. Head CT 02/19/2018. CT cervical spine 03/14/2012. FINDINGS: CT HEAD FINDINGS Brain: Moderate cerebral atrophy. Advanced ill-defined hypoattenuation within the cerebral white matter is nonspecific, but consistent with chronic small vessel ischemic disease. Known chronic lacunar infarcts within the deep gray nuclei, some of which were better appreciated on the prior MRI of 02/20/2018. There is no acute intracranial hemorrhage. No demarcated cortical infarct. No extra-axial fluid collection. No evidence of intracranial mass. No midline  shift. Vascular: No hyperdense vessel.  Atherosclerotic calcifications. Skull: Normal. Negative for fracture or focal lesion. Sinuses/Orbits: Visualized orbits show no acute finding. Mild scattered paranasal sinus mucosal thickening. Small left maxillary sinus mucous retention cyst. Trace fluid within the left mastoid air cells. CT CERVICAL SPINE FINDINGS Alignment: Trace C2-C3 grade 1 retrolisthesis. Skull base and vertebrae: The basion-dental and atlanto-dental intervals are maintained.No evidence of acute fracture to the cervical spine. 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 Redemonstrated mild chronic T2 and T3 superior endplate deformities. Soft tissues and spinal canal: No prevertebral fluid or swelling. No visible canal hematoma. Disc levels: Cervical spondylosis. Most notably at C6-C7, there is advanced disc space narrowing with a prominent posterior disc osteophyte complex and left greater than right disc osteophyte ridge/uncinate hypertrophy. There is moderate spinal canal stenosis at this level with left-sided bony neural foraminal narrowing. Upper chest: No consolidation within the imaged lung apices. No visible pneumothorax. Other: Known chronic fracture deformity of the distal left clavicle, partially imaged. IMPRESSION: CT head: 1. No evidence of acute intracranial abnormality. 2. Moderate cerebral atrophy and advanced chronic small vessel ischemic disease, as described and stable as compared to the brain MRI of 02/20/2018. 3. Mild paranasal sinus mucosal thickening. CT cervical spine: 1. No evidence of acute fracture to the cervical spine. 2. Trace C2-C3 grade 1 retrolisthesis. 3. 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 4. Cervical spondylosis as described and greatest at C6-C7. 5. Redemonstrated mild chronic T2 and T3 superior endplate deformities. Electronically Signed   By: Kellie Simmering DO   On: 06/28/2020 18:27   DG Pelvis  Portable  Result Date: 06/28/2020 CLINICAL DATA:  Recent falls with left hip pain, initial encounter EXAM: PORTABLE PELVIS 1-2 VIEWS COMPARISON:  None. FINDINGS: Pelvic ring demonstrates mildly displaced fractures through the superior and inferior pubic rami on the left. Proximal left femur shows no acute fracture. Mild degenerative changes of the hip joints are noted. Stellate bladder calculus is noted. IMPRESSION: Fractures of the left superior and inferior pubic rami. Bladder calculus. Electronically Signed   By: Inez Catalina M.D.   On: 06/28/2020 17:56   DG Chest Portable 1 View  Result Date: 06/28/2020  CLINICAL DATA:  Recent falls with chest pain, initial encounter EXAM: PORTABLE CHEST 1 VIEW COMPARISON:  05/04/2019 FINDINGS: Cardiac shadow is within normal limits but accentuated by the AP technique. Aortic calcifications are seen. Lungs are clear bilaterally. No focal infiltrate or effusion is seen. No acute rib fractures are noted. Old healed rib fractures on the left are seen. Prior left shoulder replacement is noted. IMPRESSION: No acute abnormality noted. Electronically Signed   By: Inez Catalina M.D.   On: 06/28/2020 17:52   DG Femur 1V Left  Result Date: 06/28/2020 CLINICAL DATA:  Recent falls with left hip pain, initial encounter EXAM: LEFT FEMUR 1 VIEW COMPARISON:  None. FINDINGS: No acute fracture or dislocation is noted. No soft tissue abnormality is seen. IMPRESSION: No acute abnormality noted. Electronically Signed   By: Inez Catalina M.D.   On: 06/28/2020 17:53    EKG: Personally reviewed.  Rhythm is normal sinus rhythm with heart rate of 65 bpm.  No dynamic ST segment changes appreciated.  Assessment/Plan Principal Problem:   Syncope   Patient has been experiencing episodes of syncope approximately every 4 to 6 weeks for the past several years however in the past 24 hours patient has experienced 2 episodes.  Patient is notably hypertensive here in the emergency  department.  Carotid massage yields no evidence of carotid sinus syndrome  No evidence of arrhythmias on review of telemetry or EKG  We will obtain orthostatic vital signs  Will evaluate patient for any evidence of infection considering presence of leukocytosis.  Obtaining chest x-ray and urinalysis  Hydrating patient gently with intravenous isotonic fluids  Resuming patient's home regimen of reduced dose Toprol-XL, holding remainder of antihypertensives for now.    Obtaining echocardiogram considering history of mild/moderate aortic stenosis with last cardiogram in 2019  Monitoring patient on telemetry overnight for any evidence of arrhythmias  Patient may benefit from repeat event monitor at time of discharge.  PT evaluation in the morning especially considering pubic rami fractures  Active Problems:   Dementia ([place])   According to the son patient may be suffering from progressive slow decline over the past several months  Continue home regimen of Aricept  Case management and physical therapy evaluations ordered -home health services or skilled nursing facility may be required at time of discharge.    Coronary artery disease involving native coronary artery of native heart without angina pectoris  . Patient is currently chest pain free . Monitoring patient on telemetry . Continue home regimen of  lipid lowering therapy and AV nodal blocking therapy    AF (paroxysmal atrial fibrillation) (HCC)   Currently rate controlled.  Holding home regimen of Eliquis considering substantial left arm ecchymosis as well as multiple recent falls.  Continue home regimen of metoprolol  Monitoring on telemetry    Mild/Moderate aortic stenosis   Known history of mild/moderate aortic stenosis based on review of echocardiogram from 2019  Obtaining repeat echocardiogram in light of multiple falls presumably secondary syncope    Chronic kidney disease, stage 3a (HCC)  Strict intake  and output monitoring Creatinine near baseline Minimizing nephrotoxic agents as much as possible Serial chemistries to monitor renal function and electrolytes    Hypomagnesemia   Substantial hypomagnesemia noted  Replacing with intravenous magnesium sulfate  Monitoring magnesium levels with serial chemistries.    Closed fracture of multiple pubic rami (HCC)   Notable fractures of the superior and inferior pubic rami on pelvic x-ray  On examination, patient does experience pain in  the past negative range of motion of the left leg without evidence of hip fracture on radiographic imaging  Pubic rami fractures typically warrant conservative management -will discuss with orthopedic surgery in the morning concerning outpatient care and follow-up  Will order physical therapy evaluation for the morning and proceed unless orthopedic surgery objects.    Leukocytosis   Patient presenting with leukocytosis without other SIRS criteria  Obtaining chest x-ray and urinalysis  Son reports 1 month history of intermittent productive cough    Hematoma of arm, left, initial encounter   Substantial left elbow hematoma secondary to skin tear status post fall on examination  Obtaining left elbow x-ray  Dressing changes daily    GERD without esophagitis  Continue home regimen of PPI    Code Status:  DNR Family Communication: Case discussed with son at the bedside who has been updated on plan of care  Status is: Observation  The patient remains OBS appropriate and will d/c before 2 midnights.  Dispo: The patient is from: Home              Anticipated d/c is to: Home              Anticipated d/c date is: 2 days              Patient currently is not medically stable to d/c.        Vernelle Emerald MD [facility] Pager [phone]  If 7PM-7AM, please contact night-coverage [email or web address] Use universal [place] password for that web site. If you do not have the  password, please call the hospital operator.  06/28/2020, 9:05 PM
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
## 2020-06-28 NOTE — ED Notes (Signed)
Dr. Cyd Silence is at bedside at this time. Pt's son is at bedside.

## 2020-06-28 NOTE — ED Notes (Signed)
Post void bladder scan- 16ml

## 2020-06-28 NOTE — ED Triage Notes (Signed)
Pt arrived via [facility]. Pt had two falls today. The first fall was at home, pt had a syncopal episode getting out of the shower. Pt hit his head. Pt is on eliquis. Family helped pt up and took him to barber shop and fell on the concrete on his left side. Per EMS pt c/o left shoulder, hip, left thigh pain. Per EMS pt has skin tear on left forearm. Pt is A&Ox2 to person and place. Pt is NSR on monitor. VSS.

## 2020-06-28 NOTE — ED Notes (Signed)
Did not obtain a standing BP and HR due to pt having a pelvic fracture.

## 2020-06-29 ENCOUNTER — Observation Stay (HOSPITAL_BASED_OUTPATIENT_CLINIC_OR_DEPARTMENT_OTHER): Payer: Medicare Other

## 2020-06-29 ENCOUNTER — Other Ambulatory Visit: Payer: Self-pay

## 2020-06-29 DIAGNOSIS — R55 Syncope and collapse: Secondary | ICD-10-CM | POA: Diagnosis not present

## 2020-06-29 DIAGNOSIS — I350 Nonrheumatic aortic (valve) stenosis: Secondary | ICD-10-CM

## 2020-06-29 LAB — CBC WITH DIFFERENTIAL/PLATELET
Abs Immature Granulocytes: 0.04 10*3/uL (ref 0.00–0.07)
Basophils Absolute: 0 10*3/uL (ref 0.0–0.1)
Basophils Relative: 0 %
Eosinophils Absolute: 0.1 10*3/uL (ref 0.0–0.5)
Eosinophils Relative: 1 %
HCT: 40.8 % (ref 39.0–52.0)
Hemoglobin: 13.7 g/dL (ref 13.0–17.0)
Immature Granulocytes: 0 %
Lymphocytes Relative: 14 %
Lymphs Abs: 1.7 10*3/uL (ref 0.7–4.0)
MCH: 30.3 pg (ref 26.0–34.0)
MCHC: 33.6 g/dL (ref 30.0–36.0)
MCV: 90.3 fL (ref 80.0–100.0)
Monocytes Absolute: 1 10*3/uL (ref 0.1–1.0)
Monocytes Relative: 9 %
Neutro Abs: 9.1 10*3/uL — ABNORMAL HIGH (ref 1.7–7.7)
Neutrophils Relative %: 76 %
Platelets: 211 10*3/uL (ref 150–400)
RBC: 4.52 MIL/uL (ref 4.22–5.81)
RDW: 13.5 % (ref 11.5–15.5)
WBC: 12 10*3/uL — ABNORMAL HIGH (ref 4.0–10.5)
nRBC: 0 % (ref 0.0–0.2)

## 2020-06-29 LAB — ECHOCARDIOGRAM COMPLETE
AR max vel: 1.22 cm2
AV Area VTI: 1.33 cm2
AV Area mean vel: 1 cm2
AV Mean grad: 26.5 mmHg
AV Peak grad: 41.6 mmHg
Ao pk vel: 3.23 m/s
Area-P 1/2: 2.66 cm2
Calc EF: 53.1 %
Height: 70.5 in
S' Lateral: 3.4 cm
Single Plane A2C EF: 60.5 %
Single Plane A4C EF: 46 %
Weight: 2560 oz

## 2020-06-29 LAB — COMPREHENSIVE METABOLIC PANEL
ALT: 21 U/L (ref 0–44)
AST: 26 U/L (ref 15–41)
Albumin: 3.5 g/dL (ref 3.5–5.0)
Alkaline Phosphatase: 79 U/L (ref 38–126)
Anion gap: 9 (ref 5–15)
BUN: 24 mg/dL — ABNORMAL HIGH (ref 8–23)
CO2: 24 mmol/L (ref 22–32)
Calcium: 9.2 mg/dL (ref 8.9–10.3)
Chloride: 105 mmol/L (ref 98–111)
Creatinine, Ser: 1.33 mg/dL — ABNORMAL HIGH (ref 0.61–1.24)
GFR, Estimated: 50 mL/min — ABNORMAL LOW (ref >60)
Glucose, Bld: 147 mg/dL — ABNORMAL HIGH (ref 70–99)
Potassium: 3.8 mmol/L (ref 3.5–5.1)
Sodium: 138 mmol/L (ref 135–145)
Total Bilirubin: 0.9 mg/dL (ref 0.3–1.2)
Total Protein: 6.2 g/dL — ABNORMAL LOW (ref 6.5–8.1)

## 2020-06-29 LAB — PROTIME-INR
INR: 1.2 (ref 0.8–1.2)
Prothrombin Time: 14.7 seconds (ref 11.4–15.2)

## 2020-06-29 LAB — APTT: aPTT: 41 seconds — ABNORMAL HIGH (ref 24–36)

## 2020-06-29 LAB — MAGNESIUM: Magnesium: 2.2 mg/dL (ref 1.7–2.4)

## 2020-06-29 MED ORDER — AMLODIPINE BESYLATE 2.5 MG PO TABS: 2.5000 mg | ORAL_TABLET | Freq: Every day | ORAL | Status: DC

## 2020-06-29 MED ORDER — SODIUM CHLORIDE 0.9 % IV SOLN: INTRAVENOUS | Status: AC

## 2020-06-29 MED ORDER — INFLUENZA VAC A&B SA ADJ QUAD 0.5 ML IM PRSY
0.5000 mL | PREFILLED_SYRINGE | INTRAMUSCULAR | Status: AC
  Administered 2020-06-30: 0.5 mL via INTRAMUSCULAR
  Filled 2020-06-29: qty 0.5

## 2020-06-29 MED ORDER — PNEUMOCOCCAL VAC POLYVALENT 25 MCG/0.5ML IJ INJ
0.5000 mL | INJECTION | INTRAMUSCULAR | Status: AC
  Administered 2020-06-30: 0.5 mL via INTRAMUSCULAR
  Filled 2020-06-29: qty 0.5

## 2020-06-29 MED ORDER — SODIUM CHLORIDE 0.9 % IV SOLN: INTRAVENOUS | Status: DC

## 2020-06-29 NOTE — ED Notes (Signed)
MD Maren Beach notified of abnormal orthostatic vital signs. Pt returned to bed and turned for comfort. Resistant to change of brief will reassess.

## 2020-06-29 NOTE — Progress Notes (Signed)
PROGRESS NOTE    Derek [name]  ITG:[id number] DOB: 03-28-1927 DOA: 06/28/2020 PCP: Seward Carol, MD   Chief Complaint  Patient presents with  . Fall   Brief Narrative: 87 year old male with history of paroxysmal atrial fibrillation on Eliquis, coronary artery disease status post BMS to CFX in 2014, mild/moderate aortic stenosis valve area 1.29 cm, echo 12/2017, dementia, CKD stage IIIa, OSA noncompliant with CPAP, HLD, HTN, GERD, with multiple falls last past 2 years, brought to the ED with loss of consciousness and fall. On admission son explained that in the past several months the patient has exhibited gradually worsening generalized weakness associated with unsteady gait and progressively worsening confusion. Early on morning on 06/28/20, while the patient was sitting on his shower chair, he experienced an unwitnessed episode of loss of consciousness and fall.  When the son ran into the bathroom it became apparent that the patient had hit his head on the side of the tub without any apparent injury.  Later in the day, as the patient was leaving the barbershop and walking to his son's car patient experienced another episode of loss of consciousness and fall, hitting his head on an embankment nearby.  During the fall the patient suffered multiple injuries including an abrasion to the scalp as well as a substantial skin tear to the left arm.  Because of these injuries and the increasing frequency of these episodes patient was brought in by EMS to [facility] emergency department for further evaluation. Patient had intermittent productive cough with clear to yellow sputum for a month.   Seen in the ED and was admitted for further management.  Subjective: Seen this morning laying on the bed,nursing did orthostatics and was positive and he was symptomatic.   Assessment & Plan:  Syncope suspect orthostatic hypotension ( Sitting 164/90 HR 81 to standing BP 108/67 HR 130 but patient  has had syncopal episodes on and off for past 2 years occurring every 4 to 6 weeks and was evaluated by cardiology and they were cutting down on his meds. Son endorses it happens when he over exerts. Will hold  Off his amlodipine. Cont on ivf hydration. Repeat echo EF 60 to 65%, severe aortic valve thickening with moderate aortic stenosis. Continue monitoring telemetry.  Telemetry supportive management multiple PVCs no other acute finding.  We will continue PT OT evaluation anticipate a skilled nursing facility placement.  Chest x-ray and urinalysis were obtained and no evidence of infection.Toprol dose is reduced.  Due to orthostatics.  Leukocytosis:unclear etiology, downtrending could be in the setting of dehydration.  Monitor and continue IV fluids.  Moderate aortic stenosis, known history echo repeated today-valve area 1.33, previously 1.29.  Dementia: He is alert, awake and baseline.  Supportive measures, fall precaution.  He seems to be having progressive slow decline, continue home Aricept.  CAD native coronary artery of native heart without angina pectoris: No chest pain.  Continue metoprolol, statin.  PAF: Rate controlled, Eliquis has been discontinued given his substantial left arm ecchymosis as well as multiple recent falls, continue home metoprolol and monitor in telemetry. Discussed w/ son regarding holding eliquis- he understands risks/beenfits of holding and agrees.  Chronic kidney disease, stage 3a: Renal function overall stable at baseline.cont to monitor  Hypomagnesemia: Replaced.  Closed fracture of multiple pubic rami due to fall continue PT OT eval.  Continue pain control.  Hematoma of arm, left, initial encounter: Substantial left elbow ecchymosis with skin tears secondary to fall.  Left elbow x-ray  was done continue dressing changes.  GERD continue PPI.  Nutrition: Diet Order            Diet Heart Room service appropriate? Yes; Fluid consistency: Thin  Diet effective  now                Body mass index is 22.63 kg/m. DVT prophylaxis: SCDs Start: 06/28/20 2106 Code Status:   Code Status: DNR Family Communication: plan of care discussed with patient at bedside and with his soN over the phone.  Status is: admitted Observation And remains hospitalized due to ongoing symptomatic orthostatic hypotension and syncope, remains on IV fluids. Dispo: The patient is from: Home              Anticipated d/c is to: SNF              Anticipated d/c date is: 2 days              Patient currently is not medically stable to d/c. Consultants:see note  Procedures:see note  Left Elbow XRAY No definitive fracture seen. Soft tissue swelling along the proximal forearm. Irregularity of the proximal radial head consistent with a healed prior fracture.  TTE: IMPRESSIONS  1. Left ventricular ejection fraction, by estimation, is 60 to 65%. The  left ventricle has normal function. The left ventricle has no regional  wall motion abnormalities. Left ventricular diastolic parameters are  consistent with Grade I diastolic  dysfunction (impaired relaxation).  2. Right ventricular systolic function is normal. The right ventricular  size is normal.  3. The mitral valve is normal in structure. Trivial mitral valve  regurgitation. No evidence of mitral stenosis.  4. The aortic valve has an indeterminant number of cusps. There is severe  calcifcation of the aortic valve. There is severe thickening of the aortic  valve. Aortic valve regurgitation is not visualized. Moderate aortic valve  stenosis. Aortic valve area,  by VTI measures 1.33 cm. Aortic valve mean gradient measures 26.5 mmHg.  Aortic valve Vmax measures 3.22 m/s.  5. The inferior vena cava is normal in size with greater than 50%  respiratory variability, suggesting right atrial pressure of 3 mmHg.  Culture/Microbiology    Component Value Date/Time   SDES URINE, CLEAN CATCH 02/19/2018 2153   SPECREQUEST   02/19/2018 2153    NONE Performed at [facility] [address], [place], [place] [address]    CULT MULTIPLE SPECIES PRESENT, SUGGEST RECOLLECTION (A) 02/19/2018 2153   REPTSTATUS 02/21/2018 FINAL 02/19/2018 2153    Other culture-see note  Medications: Scheduled Meds: . atorvastatin  40 mg Oral QPM  . donepezil  10 mg Oral QHS  . isosorbide mononitrate  30 mg Oral Daily  . metoprolol succinate  25 mg Oral Daily  . pantoprazole  40 mg Oral QAC breakfast   Continuous Infusions: . sodium chloride      Antimicrobials: Anti-infectives (From admission, onward)   None     Objective: Vitals: Today's Vitals   06/29/20 1300 06/29/20 1345 06/29/20 1400 06/29/20 1430  BP: (!) 147/83 114/70 125/67 139/69  Pulse: 63  (!) 53 63  Resp: 15 17 13 13   Temp:    99.1 F (37.3 C)  TempSrc:    Oral  SpO2: 96%  97% 95%  Weight:      Height:      PainSc:       No intake or output data in the 24 hours ending 06/29/20 1501 Filed Weights   06/28/20  1557  Weight: 72.6 kg   Weight change:   Intake/Output from previous day: No intake/output data recorded. Intake/Output this shift: No intake/output data recorded.  Examination: General exam: AAO ,NAD, weak appearing. HEENT:Oral mucosa moist, Ear/Nose WNL grossly,dentition normal. Respiratory system: bilaterally clear,no wheezing or crackles,no use of accessory muscle, non tender. Cardiovascular system: S1 & S2 +, regular, No JVD. Gastrointestinal system: Abdomen soft, NT,ND, BS+. Nervous System:Alert, awake, moving extremities and grossly nonfocal Extremities: No edema, distal peripheral pulses palpable.  Skin: No rashes,no icterus. MSK: left arm ecchymoses. Normal muscle bulk,tone, power  Data Reviewed: I have personally reviewed following labs and imaging studies CBC: Recent Labs  Lab 06/28/20 1844 06/29/20 0210  WBC 14.0* 12.0*  NEUTROABS 11.6* 9.1*  HGB 14.6 13.7  HCT 44.2 40.8  MCV 90.9 90.3  PLT 215 151    Basic Metabolic Panel: Recent Labs  Lab 06/28/20 1844 06/29/20 0210  NA 141 138  K 3.8 3.8  CL 110 105  CO2 21* 24  GLUCOSE 110* 147*  BUN 25* 24*  CREATININE 1.22 1.33*  CALCIUM 8.5* 9.2  MG 1.5* 2.2   GFR: Estimated Creatinine Clearance: 37.1 mL/min (A) (by C-G formula based on SCr of 1.33 mg/dL (H)). Liver Function Tests: Recent Labs  Lab 06/28/20 1844 06/29/20 0210  AST 25 26  ALT 20 21  ALKPHOS 73 79  BILITOT 0.8 0.9  PROT 5.6* 6.2*  ALBUMIN 3.2* 3.5   No results for input(s): LIPASE, AMYLASE in the last 168 hours. No results for input(s): AMMONIA in the last 168 hours. Coagulation Profile: Recent Labs  Lab 06/28/20 1844 06/29/20 0210  INR 1.2 1.2   Cardiac Enzymes: No results for input(s): CKTOTAL, CKMB, CKMBINDEX, TROPONINI in the last 168 hours. BNP (last 3 results) No results for input(s): PROBNP in the last 8760 hours. HbA1C: No results for input(s): HGBA1C in the last 72 hours. CBG: No results for input(s): GLUCAP in the last 168 hours. Lipid Profile: No results for input(s): CHOL, HDL, LDLCALC, TRIG, CHOLHDL, LDLDIRECT in the last 72 hours. Thyroid Function Tests: No results for input(s): TSH, T4TOTAL, FREET4, T3FREE, THYROIDAB in the last 72 hours. Anemia Panel: No results for input(s): VITAMINB12, FOLATE, FERRITIN, TIBC, IRON, RETICCTPCT in the last 72 hours. Sepsis Labs: No results for input(s): PROCALCITON, LATICACIDVEN in the last 168 hours.  Recent Results (from the past 240 hour(s))  Respiratory Panel by RT PCR (Flu A&B, Covid) - Nasopharyngeal Swab     Status: None   Collection Time: 06/28/20  7:59 PM   Specimen: Nasopharyngeal Swab  Result Value Ref Range Status   SARS Coronavirus 2 by RT PCR NEGATIVE NEGATIVE Final    Comment: (NOTE) SARS-CoV-2 target nucleic acids are NOT DETECTED.  The SARS-CoV-2 RNA is generally detectable in upper respiratoy specimens during the acute phase of infection. The lowest concentration of  SARS-CoV-2 viral copies this assay can detect is 131 copies/mL. A negative result does not preclude SARS-Cov-2 infection and should not be used as the sole basis for treatment or other patient management decisions. A negative result may occur with  improper specimen collection/handling, submission of specimen other than nasopharyngeal swab, presence of viral mutation(s) within the areas targeted by this assay, and inadequate number of viral copies (<131 copies/mL). A negative result must be combined with clinical observations, patient history, and epidemiological information. The expected result is Negative.  Fact Sheet for Patients:  PinkCheek.be  Fact Sheet for Healthcare Providers:  GravelBags.it  This test is no t  yet approved or cleared by the [place] and  has been authorized for detection and/or diagnosis of SARS-CoV-2 by FDA under an Emergency Use Authorization (EUA). This EUA will remain  in effect (meaning this test can be used) for the duration of the COVID-19 declaration under Section 564(b)(1) of the Act, 21 U.S.C. section 360bbb-3(b)(1), unless the authorization is terminated or revoked sooner.     Influenza A by PCR NEGATIVE NEGATIVE Final   Influenza B by PCR NEGATIVE NEGATIVE Final    Comment: (NOTE) The Xpert Xpress SARS-CoV-2/FLU/RSV assay is intended as an aid in  the diagnosis of influenza from Nasopharyngeal swab specimens and  should not be used as a sole basis for treatment. Nasal washings and  aspirates are unacceptable for Xpert Xpress SARS-CoV-2/FLU/RSV  testing.  Fact Sheet for Patients: PinkCheek.be  Fact Sheet for Healthcare Providers: GravelBags.it  This test is not yet approved or cleared by the [place] FDA and  has been authorized for detection and/or diagnosis of SARS-CoV-2 by  FDA under an Emergency Use  Authorization (EUA). This EUA will remain  in effect (meaning this test can be used) for the duration of the  Covid-19 declaration under Section 564(b)(1) of the Act, 21  U.S.C. section 360bbb-3(b)(1), unless the authorization is  terminated or revoked. Performed at [facility], [place] [address], [place], [place] [address]      Radiology Studies: DG Elbow 2 Views Left  Result Date: 06/28/2020 CLINICAL DATA:  Recent fall with skin tears, initial encounter EXAM: LEFT ELBOW - 2 VIEW COMPARISON:  None. FINDINGS: Considerable soft tissue swelling is noted along the proximal forearm consistent with the recent injury. Mild olecranon spurring is seen. No acute fracture is seen. There are findings suspicious for prior healed radial head fracture. No joint effusion is noted. IMPRESSION: No definitive fracture seen. Soft tissue swelling along the proximal forearm. Irregularity of the proximal radial head consistent with a healed prior fracture. Electronically Signed   By: Inez Catalina M.D.   On: 06/28/2020 21:37   DG Knee 2 Views Left  Result Date: 06/28/2020 CLINICAL DATA:  Recent falls with left knee pain, initial encounter EXAM: LEFT KNEE - 2 VIEW COMPARISON:  None. FINDINGS: No evidence of fracture, dislocation, or joint effusion. No evidence of arthropathy or other focal bone abnormality. Soft tissues are unremarkable. IMPRESSION: No acute abnormality noted. Electronically Signed   By: Inez Catalina M.D.   On: 06/28/2020 17:56   CT Head Wo Contrast  Result Date: 06/28/2020 CLINICAL DATA:  Head trauma, minor. Additional history provided: Falls. EXAM: CT HEAD WITHOUT CONTRAST CT CERVICAL SPINE WITHOUT CONTRAST TECHNIQUE: Multidetector CT imaging of the head and cervical spine was performed following the standard protocol without intravenous contrast. Multiplanar CT image reconstructions of the cervical spine were also generated. COMPARISON:  Brain MRI 02/20/2018. Head CT 02/19/2018. CT cervical  spine 03/14/2012. FINDINGS: CT HEAD FINDINGS Brain: Moderate cerebral atrophy. Advanced ill-defined hypoattenuation within the cerebral white matter is nonspecific, but consistent with chronic small vessel ischemic disease. Known chronic lacunar infarcts within the deep gray nuclei, some of which were better appreciated on the prior MRI of 02/20/2018. There is no acute intracranial hemorrhage. No demarcated cortical infarct. No extra-axial fluid collection. No evidence of intracranial mass. No midline shift. Vascular: No hyperdense vessel.  Atherosclerotic calcifications. Skull: Normal. Negative for fracture or focal lesion. Sinuses/Orbits: Visualized orbits show no acute finding. Mild scattered paranasal sinus mucosal thickening. Small left maxillary sinus mucous retention cyst.  Trace fluid within the left mastoid air cells. CT CERVICAL SPINE FINDINGS Alignment: Trace C2-C3 grade 1 retrolisthesis. Skull base and vertebrae: The basion-dental and atlanto-dental intervals are maintained.No evidence of acute fracture to the cervical spine. 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 Redemonstrated mild chronic T2 and T3 superior endplate deformities. Soft tissues and spinal canal: No prevertebral fluid or swelling. No visible canal hematoma. Disc levels: Cervical spondylosis. Most notably at C6-C7, there is advanced disc space narrowing with a prominent posterior disc osteophyte complex and left greater than right disc osteophyte ridge/uncinate hypertrophy. There is moderate spinal canal stenosis at this level with left-sided bony neural foraminal narrowing. Upper chest: No consolidation within the imaged lung apices. No visible pneumothorax. Other: Known chronic fracture deformity of the distal left clavicle, partially imaged. IMPRESSION: CT head: 1. No evidence of acute intracranial abnormality. 2. Moderate cerebral atrophy and advanced chronic small vessel ischemic disease, as described and  stable as compared to the brain MRI of 02/20/2018. 3. Mild paranasal sinus mucosal thickening. CT cervical spine: 1. No evidence of acute fracture to the cervical spine. 2. Trace C2-C3 grade 1 retrolisthesis. 3. 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 4. Cervical spondylosis as described and greatest at C6-C7. 5. Redemonstrated mild chronic T2 and T3 superior endplate deformities. Electronically Signed   By: Kellie Simmering DO   On: 06/28/2020 18:27   CT Cervical Spine Wo Contrast  Result Date: 06/28/2020 CLINICAL DATA:  Head trauma, minor. Additional history provided: Falls. EXAM: CT HEAD WITHOUT CONTRAST CT CERVICAL SPINE WITHOUT CONTRAST TECHNIQUE: Multidetector CT imaging of the head and cervical spine was performed following the standard protocol without intravenous contrast. Multiplanar CT image reconstructions of the cervical spine were also generated. COMPARISON:  Brain MRI 02/20/2018. Head CT 02/19/2018. CT cervical spine 03/14/2012. FINDINGS: CT HEAD FINDINGS Brain: Moderate cerebral atrophy. Advanced ill-defined hypoattenuation within the cerebral white matter is nonspecific, but consistent with chronic small vessel ischemic disease. Known chronic lacunar infarcts within the deep gray nuclei, some of which were better appreciated on the prior MRI of 02/20/2018. There is no acute intracranial hemorrhage. No demarcated cortical infarct. No extra-axial fluid collection. No evidence of intracranial mass. No midline shift. Vascular: No hyperdense vessel.  Atherosclerotic calcifications. Skull: Normal. Negative for fracture or focal lesion. Sinuses/Orbits: Visualized orbits show no acute finding. Mild scattered paranasal sinus mucosal thickening. Small left maxillary sinus mucous retention cyst. Trace fluid within the left mastoid air cells. CT CERVICAL SPINE FINDINGS Alignment: Trace C2-C3 grade 1 retrolisthesis. Skull base and vertebrae: The basion-dental and atlanto-dental  intervals are maintained.No evidence of acute fracture to the cervical spine. 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 Redemonstrated mild chronic T2 and T3 superior endplate deformities. Soft tissues and spinal canal: No prevertebral fluid or swelling. No visible canal hematoma. Disc levels: Cervical spondylosis. Most notably at C6-C7, there is advanced disc space narrowing with a prominent posterior disc osteophyte complex and left greater than right disc osteophyte ridge/uncinate hypertrophy. There is moderate spinal canal stenosis at this level with left-sided bony neural foraminal narrowing. Upper chest: No consolidation within the imaged lung apices. No visible pneumothorax. Other: Known chronic fracture deformity of the distal left clavicle, partially imaged. IMPRESSION: CT head: 1. No evidence of acute intracranial abnormality. 2. Moderate cerebral atrophy and advanced chronic small vessel ischemic disease, as described and stable as compared to the brain MRI of 02/20/2018. 3. Mild paranasal sinus mucosal thickening. CT cervical spine: 1. No evidence of acute fracture  to the cervical spine. 2. Trace C2-C3 grade 1 retrolisthesis. 3. 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 4. Cervical spondylosis as described and greatest at C6-C7. 5. Redemonstrated mild chronic T2 and T3 superior endplate deformities. Electronically Signed   By: Kellie Simmering DO   On: 06/28/2020 18:27   DG Pelvis Portable  Result Date: 06/28/2020 CLINICAL DATA:  Recent falls with left hip pain, initial encounter EXAM: PORTABLE PELVIS 1-2 VIEWS COMPARISON:  None. FINDINGS: Pelvic ring demonstrates mildly displaced fractures through the superior and inferior pubic rami on the left. Proximal left femur shows no acute fracture. Mild degenerative changes of the hip joints are noted. Stellate bladder calculus is noted. IMPRESSION: Fractures of the left superior and inferior pubic rami.  Bladder calculus. Electronically Signed   By: Inez Catalina M.D.   On: 06/28/2020 17:56   DG Chest Portable 1 View  Result Date: 06/28/2020 CLINICAL DATA:  Recent falls with chest pain, initial encounter EXAM: PORTABLE CHEST 1 VIEW COMPARISON:  05/04/2019 FINDINGS: Cardiac shadow is within normal limits but accentuated by the AP technique. Aortic calcifications are seen. Lungs are clear bilaterally. No focal infiltrate or effusion is seen. No acute rib fractures are noted. Old healed rib fractures on the left are seen. Prior left shoulder replacement is noted. IMPRESSION: No acute abnormality noted. Electronically Signed   By: Inez Catalina M.D.   On: 06/28/2020 17:52   ECHOCARDIOGRAM COMPLETE  Result Date: 06/29/2020    ECHOCARDIOGRAM REPORT   Patient Name:   Derek [name] Date of Exam: 06/29/2020 Medical Rec #:  [id number]        Height:       70.5 in Accession #:    [id number]       Weight:       160.0 lb Date of Birth:  15-Apr-1927        BSA:          1.909 m Patient Age:    38 years         BP:           156/98 mmHg Patient Gender: M                HR:           81 bpm. Exam Location:  Inpatient Procedure: 2D Echo, Cardiac Doppler and Color Doppler Indications:    Aortic Stenosis 424.1 / 135.0  History:        Patient has prior history of Echocardiogram examinations, most                 recent 12/25/2017. CAD and Previous Myocardial Infarction, Stroke                 and TIA; Risk Factors:Hypertension and Non-Smoker.  Sonographer:    Vickie Epley RDCS Referring Phys: [id number] [place]  1. Left ventricular ejection fraction, by estimation, is 60 to 65%. The left ventricle has normal function. The left ventricle has no regional wall motion abnormalities. Left ventricular diastolic parameters are consistent with Grade I diastolic dysfunction (impaired relaxation).  2. Right ventricular systolic function is normal. The right ventricular size is normal.  3. The mitral valve is normal in  structure. Trivial mitral valve regurgitation. No evidence of mitral stenosis.  4. The aortic valve has an indeterminant number of cusps. There is severe calcifcation of the aortic valve. There is severe thickening of the aortic valve. Aortic valve regurgitation is not visualized. Moderate aortic valve  stenosis. Aortic valve area, by VTI measures 1.33 cm. Aortic valve mean gradient measures 26.5 mmHg. Aortic valve Vmax measures 3.22 m/s.  5. The inferior vena cava is normal in size with greater than 50% respiratory variability, suggesting right atrial pressure of 3 mmHg. FINDINGS  Left Ventricle: Left ventricular ejection fraction, by estimation, is 60 to 65%. The left ventricle has normal function. The left ventricle has no regional wall motion abnormalities. The left ventricular internal cavity size was normal in size. There is  no left ventricular hypertrophy. Left ventricular diastolic parameters are consistent with Grade I diastolic dysfunction (impaired relaxation). Normal left ventricular filling pressure. Right Ventricle: The right ventricular size is normal. No increase in right ventricular wall thickness. Right ventricular systolic function is normal. Left Atrium: Left atrial size was normal in size. Right Atrium: Right atrial size was normal in size. Pericardium: There is no evidence of pericardial effusion. Mitral Valve: The mitral valve is normal in structure. Mild to moderate mitral annular calcification. Trivial mitral valve regurgitation. No evidence of mitral valve stenosis. Tricuspid Valve: The tricuspid valve is normal in structure. Tricuspid valve regurgitation is trivial. No evidence of tricuspid stenosis. Aortic Valve: The aortic valve has an indeterminant number of cusps. There is severe calcifcation of the aortic valve. There is severe thickening of the aortic valve. Aortic valve regurgitation is not visualized. Moderate aortic stenosis is present. Aortic valve mean gradient measures 26.5  mmHg. Aortic valve peak gradient measures 41.6 mmHg. Aortic valve area, by VTI measures 1.33 cm. Pulmonic Valve: The pulmonic valve was normal in structure. Pulmonic valve regurgitation is not visualized. No evidence of pulmonic stenosis. Aorta: The aortic root is normal in size and structure. Venous: The inferior vena cava is normal in size with greater than 50% respiratory variability, suggesting right atrial pressure of 3 mmHg. IAS/Shunts: No atrial level shunt detected by color flow Doppler.  LEFT VENTRICLE PLAX 2D LVIDd:         4.50 cm     Diastology LVIDs:         3.40 cm     LV e' medial:    5.52 cm/s LV PW:         0.80 cm     LV E/e' medial:  14.7 LV IVS:        0.80 cm     LV e' lateral:   7.97 cm/s LVOT diam:     2.10 cm     LV E/e' lateral: 10.2 LV SV:         106 LV SV Index:   56 LVOT Area:     3.46 cm  LV Volumes (MOD) LV vol d, MOD A2C: 69.3 ml LV vol d, MOD A4C: 71.3 ml LV vol s, MOD A2C: 27.4 ml LV vol s, MOD A4C: 38.5 ml LV SV MOD A2C:     41.9 ml LV SV MOD A4C:     71.3 ml LV SV MOD BP:      37.6 ml RIGHT VENTRICLE RV S prime:     15.10 cm/s TAPSE (M-mode): 2.6 cm LEFT ATRIUM             Index       RIGHT ATRIUM           Index LA diam:        4.40 cm 2.31 cm/m  RA Area:     13.30 cm LA Vol (A2C):   49.0 ml 25.67 ml/m RA Volume:   31.90 ml  16.71 ml/m LA Vol (A4C):   27.4 ml 14.36 ml/m LA Biplane Vol: 40.6 ml 21.27 ml/m  AORTIC VALVE AV Area (Vmax):    1.22 cm AV Area (Vmean):   1.00 cm AV Area (VTI):     1.33 cm AV Vmax:           322.50 cm/s AV Vmean:          246.500 cm/s AV VTI:            0.794 m AV Peak Grad:      41.6 mmHg AV Mean Grad:      26.5 mmHg LVOT Vmax:         114.00 cm/s LVOT Vmean:        71.500 cm/s LVOT VTI:          0.306 m LVOT/AV VTI ratio: 0.39  AORTA Ao Root diam: 3.20 cm MITRAL VALVE MV Area (PHT): 2.66 cm     SHUNTS MV Decel Time: 285 msec     Systemic VTI:  0.31 m MV E velocity: 81.40 cm/s   Systemic Diam: 2.10 cm MV A velocity: 124.00 cm/s MV E/A ratio:   0.66 Fransico Him MD Electronically signed by Fransico Him MD Signature Date/Time: 06/29/2020/10:42:50 AM    Final    DG Femur 1V Left  Result Date: 06/28/2020 CLINICAL DATA:  Recent falls with left hip pain, initial encounter EXAM: LEFT FEMUR 1 VIEW COMPARISON:  None. FINDINGS: No acute fracture or dislocation is noted. No soft tissue abnormality is seen. IMPRESSION: No acute abnormality noted. Electronically Signed   By: Inez Catalina M.D.   On: 06/28/2020 17:53     LOS: 0 days   Antonieta Pert, MD [facility]  06/29/2020, 3:01 PM

## 2020-06-29 NOTE — Progress Notes (Signed)
  Echocardiogram 2D Echocardiogram has been performed.  Geoffery Lyons Swaim 06/29/2020, 9:11 AM

## 2020-06-29 NOTE — Evaluation (Signed)
Physical Therapy Evaluation Patient Details Name: Derek [name] MRN: [id number] DOB: 29-Oct-1926 Today's Date: 06/29/2020   History of Present Illness  Pt is a 86 y/o male admitted secondary to syncopal episode with fall. Found to have L pubic rami fx to be treated conservatively. PMH includes CAD, a fib, CKD, and dementia.   Clinical Impression  Pt admitted secondary to problem above with deficits below. Pt very limited secondary to pain. Attempted to sit at edge of stretcher, however, when PT started moving LEs, pt requesting to stop. Was able to come into long sitting with mod A and heavy use of BUEs. Assisted with rolling for repositioning; pt requiring max A. Feel pt would benefit from SNF level therapies at d/c. Will continue to follow acutely.     Follow Up Recommendations SNF;Supervision/Assistance - 24 hour    Equipment Recommendations  Wheelchair cushion (measurements PT);Wheelchair (measurements PT) (hoyer and Acupuncturist)    Recommendations for Other Services       Precautions / Restrictions Precautions Precautions: Fall Restrictions Weight Bearing Restrictions: No Other Position/Activity Restrictions: WBAT; conservative management.       Mobility  Bed Mobility Overal bed mobility: Needs Assistance Bed Mobility: Supine to Sit;Rolling Rolling: Max assist         General bed mobility comments: MAx A to roll for repositioning. Attempted to come to sitting, however, when legs were moved towards EOB, pt requesting to stop secondary to pain. Was able to come to long sitting with heavy use of bed rails and mod A.     Transfers                 General transfer comment: unable   Ambulation/Gait                Stairs            Wheelchair Mobility    Modified Rankin (Stroke Patients Only)       Balance                                             Pertinent Vitals/Pain Pain Assessment: 0-10 Pain Score: 10-Worst  pain ever Pain Location: L pelivis area and RLE Pain Descriptors / Indicators: Grimacing;Guarding Pain Intervention(s): Limited activity within patient's tolerance;Monitored during session;Repositioned    Home Living Family/patient expects to be discharged to:: Skilled nursing facility                 Additional Comments: Pt unable to provide home information secondary to dementia.     Prior Function           Comments: Reports he ambulates with RW, but not sure if he required assist.      Hand Dominance        Extremity/Trunk Assessment   Upper Extremity Assessment Upper Extremity Assessment: Generalized weakness    Lower Extremity Assessment Lower Extremity Assessment: Generalized weakness;LLE deficits/detail RLE Deficits / Details: Reports pain in RLE, however, fx in L pubic rami LLE Deficits / Details: Very limited ROM secondary to pain.       Communication   Communication: No difficulties  Cognition Arousal/Alertness: Awake/alert Behavior During Therapy: WFL for tasks assessed/performed Overall Cognitive Status: No family/caregiver present to determine baseline cognitive functioning  General Comments: Hx of dementia at baseline. Only oriented to self.       General Comments      Exercises     Assessment/Plan    PT Assessment Patient needs continued PT services  PT Problem List Decreased strength;Decreased balance;Decreased mobility;Decreased activity tolerance;Decreased cognition;Decreased safety awareness;Decreased knowledge of use of DME;Decreased knowledge of precautions;Pain       PT Treatment Interventions DME instruction;Gait training;Functional mobility training;Therapeutic activities;Therapeutic exercise;Balance training;Patient/family education    PT Goals (Current goals can be found in the Care Plan section)  Acute Rehab PT Goals PT Goal Formulation: Patient unable to participate in goal  setting Time For Goal Achievement: 07/13/20 Potential to Achieve Goals: Fair    Frequency Min 2X/week   Barriers to discharge        Co-evaluation               AM-PAC PT "6 Clicks" Mobility  Outcome Measure Help needed turning from your back to your side while in a flat bed without using bedrails?: Total Help needed moving from lying on your back to sitting on the side of a flat bed without using bedrails?: Total Help needed moving to and from a bed to a chair (including a wheelchair)?: Total Help needed standing up from a chair using your arms (e.g., wheelchair or bedside chair)?: Total Help needed to walk in hospital room?: Total Help needed climbing 3-5 steps with a railing? : Total 6 Click Score: 6    End of Session   Activity Tolerance: Patient limited by pain Patient left: in bed;with call bell/phone within reach (on stretcher in ED ) Nurse Communication: Mobility status PT Visit Diagnosis: Unsteadiness on feet (R26.81);Muscle weakness (generalized) (M62.81);Pain Pain - Right/Left: Left Pain - part of body:  (pelvis)    Time: 8099-8338 PT Time Calculation (min) (ACUTE ONLY): 12 min   Charges:   PT Evaluation $PT Eval Moderate Complexity: 1 Mod          Reuel Derby, PT, DPT  Acute Rehabilitation Services  Pager: [phone] Office: [phone]   Rudean Hitt 06/29/2020, 1:09 PM

## 2020-06-29 NOTE — ED Notes (Signed)
Performed aseptic dressing change to left upper extremity wound

## 2020-06-29 NOTE — ED Notes (Signed)
Call daughter Marcos Eke at [phone] with an update please

## 2020-06-29 NOTE — ED Notes (Signed)
Report to [facility]

## 2020-06-29 NOTE — ED Notes (Signed)
Alerted family, Marcos Eke of pt transport to PCU.
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
## 2020-06-29 NOTE — ED Notes (Signed)
Lunch Tray Ordered @ 1020.  

## 2020-06-29 NOTE — ED Notes (Signed)
Ordered breakfast--Derek [name] 

## 2020-06-29 NOTE — ED Notes (Signed)
Echo at bedside

## 2020-06-29 NOTE — ED Notes (Signed)
First contact. Change of shift. Pt resting in bed. NADN. Alert and oriented to self only.

## 2020-06-30 ENCOUNTER — Inpatient Hospital Stay (HOSPITAL_COMMUNITY): Payer: Medicare Other

## 2020-06-30 DIAGNOSIS — Y93E1 Activity, personal bathing and showering: Secondary | ICD-10-CM | POA: Diagnosis not present

## 2020-06-30 DIAGNOSIS — S32599D Other specified fracture of unspecified pubis, subsequent encounter for fracture with routine healing: Secondary | ICD-10-CM | POA: Diagnosis not present

## 2020-06-30 DIAGNOSIS — Z7401 Bed confinement status: Secondary | ICD-10-CM | POA: Diagnosis not present

## 2020-06-30 DIAGNOSIS — S5002XA Contusion of left elbow, initial encounter: Secondary | ICD-10-CM | POA: Diagnosis present

## 2020-06-30 DIAGNOSIS — I499 Cardiac arrhythmia, unspecified: Secondary | ICD-10-CM | POA: Diagnosis not present

## 2020-06-30 DIAGNOSIS — I2510 Atherosclerotic heart disease of native coronary artery without angina pectoris: Secondary | ICD-10-CM | POA: Diagnosis present

## 2020-06-30 DIAGNOSIS — I951 Orthostatic hypotension: Secondary | ICD-10-CM | POA: Diagnosis present

## 2020-06-30 DIAGNOSIS — J9690 Respiratory failure, unspecified, unspecified whether with hypoxia or hypercapnia: Secondary | ICD-10-CM | POA: Diagnosis not present

## 2020-06-30 DIAGNOSIS — G4733 Obstructive sleep apnea (adult) (pediatric): Secondary | ICD-10-CM | POA: Diagnosis present

## 2020-06-30 DIAGNOSIS — Y92091 Bathroom in other non-institutional residence as the place of occurrence of the external cause: Secondary | ICD-10-CM | POA: Diagnosis not present

## 2020-06-30 DIAGNOSIS — M2550 Pain in unspecified joint: Secondary | ICD-10-CM | POA: Diagnosis not present

## 2020-06-30 DIAGNOSIS — N1831 Chronic kidney disease, stage 3a: Secondary | ICD-10-CM | POA: Diagnosis present

## 2020-06-30 DIAGNOSIS — R55 Syncope and collapse: Secondary | ICD-10-CM | POA: Diagnosis not present

## 2020-06-30 DIAGNOSIS — I480 Paroxysmal atrial fibrillation: Secondary | ICD-10-CM | POA: Diagnosis present

## 2020-06-30 DIAGNOSIS — Z9119 Patient's noncompliance with other medical treatment and regimen: Secondary | ICD-10-CM | POA: Diagnosis not present

## 2020-06-30 DIAGNOSIS — E785 Hyperlipidemia, unspecified: Secondary | ICD-10-CM | POA: Diagnosis present

## 2020-06-30 DIAGNOSIS — R0902 Hypoxemia: Secondary | ICD-10-CM | POA: Diagnosis not present

## 2020-06-30 DIAGNOSIS — R2681 Unsteadiness on feet: Secondary | ICD-10-CM | POA: Diagnosis present

## 2020-06-30 DIAGNOSIS — N210 Calculus in bladder: Secondary | ICD-10-CM | POA: Diagnosis not present

## 2020-06-30 DIAGNOSIS — S32512A Fracture of superior rim of left pubis, initial encounter for closed fracture: Secondary | ICD-10-CM | POA: Diagnosis present

## 2020-06-30 DIAGNOSIS — K219 Gastro-esophageal reflux disease without esophagitis: Secondary | ICD-10-CM | POA: Diagnosis present

## 2020-06-30 DIAGNOSIS — K4090 Unilateral inguinal hernia, without obstruction or gangrene, not specified as recurrent: Secondary | ICD-10-CM | POA: Diagnosis not present

## 2020-06-30 DIAGNOSIS — I350 Nonrheumatic aortic (valve) stenosis: Secondary | ICD-10-CM | POA: Diagnosis present

## 2020-06-30 DIAGNOSIS — S0001XA Abrasion of scalp, initial encounter: Secondary | ICD-10-CM | POA: Diagnosis present

## 2020-06-30 DIAGNOSIS — I130 Hypertensive heart and chronic kidney disease with heart failure and stage 1 through stage 4 chronic kidney disease, or unspecified chronic kidney disease: Secondary | ICD-10-CM | POA: Diagnosis present

## 2020-06-30 DIAGNOSIS — J90 Pleural effusion, not elsewhere classified: Secondary | ICD-10-CM | POA: Diagnosis not present

## 2020-06-30 DIAGNOSIS — E440 Moderate protein-calorie malnutrition: Secondary | ICD-10-CM | POA: Diagnosis not present

## 2020-06-30 DIAGNOSIS — S32592A Other specified fracture of left pubis, initial encounter for closed fracture: Secondary | ICD-10-CM | POA: Diagnosis not present

## 2020-06-30 DIAGNOSIS — R296 Repeated falls: Secondary | ICD-10-CM | POA: Diagnosis present

## 2020-06-30 DIAGNOSIS — I471 Supraventricular tachycardia: Secondary | ICD-10-CM | POA: Diagnosis present

## 2020-06-30 DIAGNOSIS — S41112A Laceration without foreign body of left upper arm, initial encounter: Secondary | ICD-10-CM | POA: Diagnosis present

## 2020-06-30 DIAGNOSIS — W07XXXA Fall from chair, initial encounter: Secondary | ICD-10-CM | POA: Diagnosis not present

## 2020-06-30 DIAGNOSIS — R531 Weakness: Secondary | ICD-10-CM | POA: Diagnosis not present

## 2020-06-30 DIAGNOSIS — Z66 Do not resuscitate: Secondary | ICD-10-CM | POA: Diagnosis present

## 2020-06-30 DIAGNOSIS — Z8673 Personal history of transient ischemic attack (TIA), and cerebral infarction without residual deficits: Secondary | ICD-10-CM | POA: Diagnosis not present

## 2020-06-30 DIAGNOSIS — F0390 Unspecified dementia without behavioral disturbance: Secondary | ICD-10-CM | POA: Diagnosis present

## 2020-06-30 DIAGNOSIS — Z23 Encounter for immunization: Secondary | ICD-10-CM | POA: Diagnosis not present

## 2020-06-30 DIAGNOSIS — J189 Pneumonia, unspecified organism: Secondary | ICD-10-CM | POA: Diagnosis not present

## 2020-06-30 DIAGNOSIS — N183 Chronic kidney disease, stage 3 unspecified: Secondary | ICD-10-CM | POA: Diagnosis not present

## 2020-06-30 DIAGNOSIS — I10 Essential (primary) hypertension: Secondary | ICD-10-CM | POA: Diagnosis not present

## 2020-06-30 DIAGNOSIS — Z20822 Contact with and (suspected) exposure to covid-19: Secondary | ICD-10-CM | POA: Diagnosis present

## 2020-06-30 LAB — URINE CULTURE: Culture: <10000 — AB

## 2020-06-30 LAB — GLUCOSE, CAPILLARY: Glucose-Capillary: 94 mg/dL (ref 70–99)

## 2020-06-30 MED ORDER — SODIUM CHLORIDE 0.9 % IV SOLN: INTRAVENOUS | Status: DC

## 2020-06-30 NOTE — Progress Notes (Signed)
Bedside report given to Angie, RN. Patient resting in bed, sleeping, with intermittent congested cough. Patient's SPO2 88% on room air, so RN placed 2L oxygen via [place] on patient; sats improved to 92%. Patient's daughter stating that patient has been having a congested cough at home as well. Of note, patient's HR beginning to trend up into the 90s; has been in the 60s-70s for most of the shift. Angie, night shift RN, is aware and will closely monitor patient.

## 2020-06-30 NOTE — Progress Notes (Signed)
PROGRESS NOTE    Derek [name]  OEU:[id number] DOB: 21-Mar-1927 DOA: 06/28/2020 PCP: Seward Carol, MD   Chief Complaint  Patient presents with  . Fall   Brief Narrative: 86 year old male with history of paroxysmal atrial fibrillation on Eliquis, coronary artery disease status post BMS to CFX in 2014, mild/moderate aortic stenosis valve area 1.29 cm, echo 12/2017, dementia, CKD stage IIIa, OSA noncompliant with CPAP, HLD, HTN, GERD, with multiple falls last past 2 years, brought to the ED with loss of consciousness and fall. On admission son explained that in the past several months the patient has exhibited gradually worsening generalized weakness associated with unsteady gait and progressively worsening confusion. Early on morning on 06/28/20, while the patient was sitting on his shower chair, he experienced an unwitnessed episode of loss of consciousness and fall.  When the son ran into the bathroom it became apparent that the patient had hit his head on the side of the tub without any apparent injury.  Later in the day, as the patient was leaving the barbershop and walking to his son's car patient experienced another episode of loss of consciousness and fall, hitting his head on an embankment nearby.  During the fall the patient suffered multiple injuries including an abrasion to the scalp as well as a substantial skin tear to the left arm.  Because of these injuries and the increasing frequency of these episodes patient was brought in by EMS to [facility] emergency department for further evaluation. Patient had intermittent productive cough with clear to yellow sputum for a month.   Seen in the ED and was admitted for further management. Patient was orthostatic in the ED, was given IV fluids and admitted.  Subjective: Patient appeared very weak today needing a lot of assistance to get up for orthostatics.   Denies any nausea vomiting. He is nonfocal but grossly weak. Pain is  controlled   Assessment & Plan:  Syncope suspect orthostatic hypotension contributing (Sitting 164/90 HR 81 to standing BP 108/67 HR 130 in the ED but patient has had syncopal episodes on and off for past 2 years occurring every 4 to 6 weeks and was evaluated by cardiology and they were cutting down on his meds.Son endorses it happens when he over exerts. Amlodipine has been discontinued, on reduced dose of metoprolol.Keep on gentle IV fluid hydration.Repeat echo was obtained shows LVEF 60 to 65%,severe aortic valve thickening with moderate aortic stenosis.Continue telemetry, monitor,CXR/urinalysis were obtained and no evidence of infection.  Closed fracture of multiple pubic rami (superior and inferior pubic rami on pelvic x-ray):Due to fall, discussed with orthopedic, obtaining CT pelvis, cont pain control.  Leukocytosis:unclear etiology,downtrending.  Likely from dehydration, keep on gentle fluids.  Moderate aortic stenosis,known history echo repeated  As below  Dementia: Alert awake conversant, appears to be at baseline continue supportive care, fall precaution.He seems to be having progressive slow decline, continue home Aricept.  CAD native coronary artery of native heart without angina pectoris:he denies chest pain.  Continue metoprolol, statin.  PAF: Rate controlled, Eliquis has been discontinued given his left arm ecchymosis as well as multiple recent falls, continue home metoprolol and monitor in telemetry. Discussed w/ son regarding holding eliquis- he understands risks/beenfits of holding and agrees.  Chronic kidney disease, stage 3a: Renal function is stable.  Monitor. Recent Labs  Lab 06/28/20 1844 06/29/20 0210  BUN 25* 24*  CREATININE 1.22 1.33*   Hypomagnesemia: Replaced.  Hematoma of arm, left, initial encounter: Substantial left elbow ecchymosis  with skin tears secondary to fall.  Left elbow x-ray was done continue dressing changes.  GERD continue  PPI.  Nutrition: Diet Order            Diet NPO time specified Except for: Sips with Meds  Diet effective now                Body mass index is 24.05 kg/m. DVT prophylaxis: SCDs Start: 06/28/20 2106 Code Status:   Code Status: DNR Family Communication: plan of care discussed with patient at bedside.  I had updated patient's son over the phone yesterday.   Status is: admitted Observation. Patient remains hospitalized for ongoing management of syncope, with IV fluid hydration for dehydration and orthostatic hypotension. Dispo: The patient is from: Home              Anticipated d/c is to: SNF              Anticipated d/c date is:1-2 days              Patient currently is not medically stable to d/c. Consultants:see note  Procedures:see note  Left Elbow XRAY No definitive fracture seen. Soft tissue swelling along the proximal forearm. Irregularity of the proximal radial head consistent with a healed prior fracture.  TTE: IMPRESSIONS  1. Left ventricular ejection fraction, by estimation, is 60 to 65%. The  left ventricle has normal function. The left ventricle has no regional  wall motion abnormalities. Left ventricular diastolic parameters are  consistent with Grade I diastolic  dysfunction (impaired relaxation).  2. Right ventricular systolic function is normal. The right ventricular  size is normal.  3. The mitral valve is normal in structure. Trivial mitral valve  regurgitation. No evidence of mitral stenosis.  4. The aortic valve has an indeterminant number of cusps. There is severe  calcifcation of the aortic valve. There is severe thickening of the aortic  valve. Aortic valve regurgitation is not visualized. Moderate aortic valve  stenosis. Aortic valve area,  by VTI measures 1.33 cm. Aortic valve mean gradient measures 26.5 mmHg.  Aortic valve Vmax measures 3.22 m/s.  5. The inferior vena cava is normal in size with greater than 50%  respiratory  variability, suggesting right atrial pressure of 3 mmHg.  Culture/Microbiology    Component Value Date/Time   SDES URINE, CLEAN CATCH 06/28/2020 2133   SPECREQUEST NONE 06/28/2020 2133   CULT (A) 06/28/2020 2133    <10,000 COLONIES/mL INSIGNIFICANT GROWTH Performed at [facility] [address], [place], [place] [address]    REPTSTATUS 06/30/2020 FINAL 06/28/2020 2133    Other culture-see note  Medications: Scheduled Meds: . atorvastatin  40 mg Oral QPM  . donepezil  10 mg Oral QHS  . isosorbide mononitrate  30 mg Oral Daily  . metoprolol succinate  25 mg Oral Daily  . pantoprazole  40 mg Oral QAC breakfast   Continuous Infusions: . sodium chloride 75 mL/hr at 06/30/20 1149    Antimicrobials: Anti-infectives (From admission, onward)   None     Objective: Vitals: Today's Vitals   06/30/20 0100 06/30/20 0756 06/30/20 0800 06/30/20 0914  BP: (!) 139/57 (!) 127/50    Pulse: 62 63  76  Resp: 18 13    Temp: 98.9 F (37.2 C) 98.2 F (36.8 C)  98.3 F (36.8 C)  TempSrc: Oral Oral  Oral  SpO2:  94%    Weight: 77.1 kg     Height:      PainSc:  0-No pain     Intake/Output Summary (Last 24 hours) at 06/30/2020 1446 Last data filed at 06/30/2020 1149 Gross per 24 hour  Intake 362.33 ml  Output 350 ml  Net 12.33 ml   Filed Weights   06/28/20 1557 06/30/20 0100  Weight: 72.6 kg 77.1 kg   Weight change: 4.536 kg  Intake/Output from previous day: 10/22 0701 - 10/23 0700 In: 362.3 [P.O.:120; I.V.:242.3] Out: -  Intake/Output this shift: Total I/O In: -  Out: 350 [Urine:350]  Examination: General exam: AAO,NAD, weak appearing. HEENT:Oral mucosa moist, Ear/Nose WNL grossly, dentition normal. Respiratory system: bilaterally clear,no wheezing or crackles,no use of accessory muscle Cardiovascular system: S1 & S2 +, No JVD,. Gastrointestinal system: Abdomen soft, NT,ND, BS+ Nervous System:Alert, awake, moving ankles well grossly weak in lower extremities,  nonfocal Extremities: No edema, distal peripheral pulses palpable.  Skin: No rashes,no icterus. MSK: Normal muscle bulk,tone, powerr  Data Reviewed: I have personally reviewed following labs and imaging studies CBC: Recent Labs  Lab 06/28/20 1844 06/29/20 0210  WBC 14.0* 12.0*  NEUTROABS 11.6* 9.1*  HGB 14.6 13.7  HCT 44.2 40.8  MCV 90.9 90.3  PLT 215 846   Basic Metabolic Panel: Recent Labs  Lab 06/28/20 1844 06/29/20 0210  NA 141 138  K 3.8 3.8  CL 110 105  CO2 21* 24  GLUCOSE 110* 147*  BUN 25* 24*  CREATININE 1.22 1.33*  CALCIUM 8.5* 9.2  MG 1.5* 2.2   GFR: Estimated Creatinine Clearance: 38 mL/min (A) (by C-G formula based on SCr of 1.33 mg/dL (H)). Liver Function Tests: Recent Labs  Lab 06/28/20 1844 06/29/20 0210  AST 25 26  ALT 20 21  ALKPHOS 73 79  BILITOT 0.8 0.9  PROT 5.6* 6.2*  ALBUMIN 3.2* 3.5   No results for input(s): LIPASE, AMYLASE in the last 168 hours. No results for input(s): AMMONIA in the last 168 hours. Coagulation Profile: Recent Labs  Lab 06/28/20 1844 06/29/20 0210  INR 1.2 1.2   Cardiac Enzymes: No results for input(s): CKTOTAL, CKMB, CKMBINDEX, TROPONINI in the last 168 hours. BNP (last 3 results) No results for input(s): PROBNP in the last 8760 hours. HbA1C: No results for input(s): HGBA1C in the last 72 hours. CBG: Recent Labs  Lab 06/30/20 1153  GLUCAP 94   Lipid Profile: No results for input(s): CHOL, HDL, LDLCALC, TRIG, CHOLHDL, LDLDIRECT in the last 72 hours. Thyroid Function Tests: No results for input(s): TSH, T4TOTAL, FREET4, T3FREE, THYROIDAB in the last 72 hours. Anemia Panel: No results for input(s): VITAMINB12, FOLATE, FERRITIN, TIBC, IRON, RETICCTPCT in the last 72 hours. Sepsis Labs: No results for input(s): PROCALCITON, LATICACIDVEN in the last 168 hours.  Recent Results (from the past 240 hour(s))  Respiratory Panel by RT PCR (Flu A&B, Covid) - Nasopharyngeal Swab     Status: None   Collection  Time: 06/28/20  7:59 PM   Specimen: Nasopharyngeal Swab  Result Value Ref Range Status   SARS Coronavirus 2 by RT PCR NEGATIVE NEGATIVE Final    Comment: (NOTE) SARS-CoV-2 target nucleic acids are NOT DETECTED.  The SARS-CoV-2 RNA is generally detectable in upper respiratoy specimens during the acute phase of infection. The lowest concentration of SARS-CoV-2 viral copies this assay can detect is 131 copies/mL. A negative result does not preclude SARS-Cov-2 infection and should not be used as the sole basis for treatment or other patient management decisions. A negative result may occur with  improper specimen collection/handling, submission of specimen other than nasopharyngeal swab,  presence of viral mutation(s) within the areas targeted by this assay, and inadequate number of viral copies (<131 copies/mL). A negative result must be combined with clinical observations, patient history, and epidemiological information. The expected result is Negative.  Fact Sheet for Patients:  PinkCheek.be  Fact Sheet for Healthcare Providers:  GravelBags.it  This test is no t yet approved or cleared by the [place] FDA and  has been authorized for detection and/or diagnosis of SARS-CoV-2 by FDA under an Emergency Use Authorization (EUA). This EUA will remain  in effect (meaning this test can be used) for the duration of the COVID-19 declaration under Section 564(b)(1) of the Act, 21 U.S.C. section 360bbb-3(b)(1), unless the authorization is terminated or revoked sooner.     Influenza A by PCR NEGATIVE NEGATIVE Final   Influenza B by PCR NEGATIVE NEGATIVE Final    Comment: (NOTE) The Xpert Xpress SARS-CoV-2/FLU/RSV assay is intended as an aid in  the diagnosis of influenza from Nasopharyngeal swab specimens and  should not be used as a sole basis for treatment. Nasal washings and  aspirates are unacceptable for Xpert Xpress  SARS-CoV-2/FLU/RSV  testing.  Fact Sheet for Patients: PinkCheek.be  Fact Sheet for Healthcare Providers: GravelBags.it  This test is not yet approved or cleared by the [place] FDA and  has been authorized for detection and/or diagnosis of SARS-CoV-2 by  FDA under an Emergency Use Authorization (EUA). This EUA will remain  in effect (meaning this test can be used) for the duration of the  Covid-19 declaration under Section 564(b)(1) of the Act, 21  U.S.C. section 360bbb-3(b)(1), unless the authorization is  terminated or revoked. Performed at [facility], [place] [address] [place], [place] [address]   Culture, Urine     Status: Abnormal   Collection Time: 06/28/20  9:33 PM   Specimen: Urine, Clean Catch  Result Value Ref Range Status   Specimen Description URINE, CLEAN CATCH  Final   Special Requests NONE  Final   Culture (A)  Final    <10,000 COLONIES/mL INSIGNIFICANT GROWTH Performed at [facility], [place] [address] [place], [place] [address]    Report Status 06/30/2020 FINAL  Final     Radiology Studies: DG Elbow 2 Views Left  Result Date: 06/28/2020 CLINICAL DATA:  Recent fall with skin tears, initial encounter EXAM: LEFT ELBOW - 2 VIEW COMPARISON:  None. FINDINGS: Considerable soft tissue swelling is noted along the proximal forearm consistent with the recent injury. Mild olecranon spurring is seen. No acute fracture is seen. There are findings suspicious for prior healed radial head fracture. No joint effusion is noted. IMPRESSION: No definitive fracture seen. Soft tissue swelling along the proximal forearm. Irregularity of the proximal radial head consistent with a healed prior fracture. Electronically Signed   By: Inez Catalina M.D.   On: 06/28/2020 21:37   DG Knee 2 Views Left  Result Date: 06/28/2020 CLINICAL DATA:  Recent falls with left knee pain, initial encounter EXAM: LEFT KNEE - 2 VIEW  COMPARISON:  None. FINDINGS: No evidence of fracture, dislocation, or joint effusion. No evidence of arthropathy or other focal bone abnormality. Soft tissues are unremarkable. IMPRESSION: No acute abnormality noted. Electronically Signed   By: Inez Catalina M.D.   On: 06/28/2020 17:56   CT Head Wo Contrast  Result Date: 06/28/2020 CLINICAL DATA:  Head trauma, minor. Additional history provided: Falls. EXAM: CT HEAD WITHOUT CONTRAST CT CERVICAL SPINE WITHOUT CONTRAST TECHNIQUE: Multidetector CT imaging of the head  and cervical spine was performed following the standard protocol without intravenous contrast. Multiplanar CT image reconstructions of the cervical spine were also generated. COMPARISON:  Brain MRI 02/20/2018. Head CT 02/19/2018. CT cervical spine 03/14/2012. FINDINGS: CT HEAD FINDINGS Brain: Moderate cerebral atrophy. Advanced ill-defined hypoattenuation within the cerebral white matter is nonspecific, but consistent with chronic small vessel ischemic disease. Known chronic lacunar infarcts within the deep gray nuclei, some of which were better appreciated on the prior MRI of 02/20/2018. There is no acute intracranial hemorrhage. No demarcated cortical infarct. No extra-axial fluid collection. No evidence of intracranial mass. No midline shift. Vascular: No hyperdense vessel.  Atherosclerotic calcifications. Skull: Normal. Negative for fracture or focal lesion. Sinuses/Orbits: Visualized orbits show no acute finding. Mild scattered paranasal sinus mucosal thickening. Small left maxillary sinus mucous retention cyst. Trace fluid within the left mastoid air cells. CT CERVICAL SPINE FINDINGS Alignment: Trace C2-C3 grade 1 retrolisthesis. Skull base and vertebrae: The basion-dental and atlanto-dental intervals are maintained.No evidence of acute fracture to the cervical spine. 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 Redemonstrated mild chronic T2 and T3 superior endplate  deformities. Soft tissues and spinal canal: No prevertebral fluid or swelling. No visible canal hematoma. Disc levels: Cervical spondylosis. Most notably at C6-C7, there is advanced disc space narrowing with a prominent posterior disc osteophyte complex and left greater than right disc osteophyte ridge/uncinate hypertrophy. There is moderate spinal canal stenosis at this level with left-sided bony neural foraminal narrowing. Upper chest: No consolidation within the imaged lung apices. No visible pneumothorax. Other: Known chronic fracture deformity of the distal left clavicle, partially imaged. IMPRESSION: CT head: 1. No evidence of acute intracranial abnormality. 2. Moderate cerebral atrophy and advanced chronic small vessel ischemic disease, as described and stable as compared to the brain MRI of 02/20/2018. 3. Mild paranasal sinus mucosal thickening. CT cervical spine: 1. No evidence of acute fracture to the cervical spine. 2. Trace C2-C3 grade 1 retrolisthesis. 3. 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 4. Cervical spondylosis as described and greatest at C6-C7. 5. Redemonstrated mild chronic T2 and T3 superior endplate deformities. Electronically Signed   By: Kellie Simmering DO   On: 06/28/2020 18:27   CT Cervical Spine Wo Contrast  Result Date: 06/28/2020 CLINICAL DATA:  Head trauma, minor. Additional history provided: Falls. EXAM: CT HEAD WITHOUT CONTRAST CT CERVICAL SPINE WITHOUT CONTRAST TECHNIQUE: Multidetector CT imaging of the head and cervical spine was performed following the standard protocol without intravenous contrast. Multiplanar CT image reconstructions of the cervical spine were also generated. COMPARISON:  Brain MRI 02/20/2018. Head CT 02/19/2018. CT cervical spine 03/14/2012. FINDINGS: CT HEAD FINDINGS Brain: Moderate cerebral atrophy. Advanced ill-defined hypoattenuation within the cerebral white matter is nonspecific, but consistent with chronic small vessel  ischemic disease. Known chronic lacunar infarcts within the deep gray nuclei, some of which were better appreciated on the prior MRI of 02/20/2018. There is no acute intracranial hemorrhage. No demarcated cortical infarct. No extra-axial fluid collection. No evidence of intracranial mass. No midline shift. Vascular: No hyperdense vessel.  Atherosclerotic calcifications. Skull: Normal. Negative for fracture or focal lesion. Sinuses/Orbits: Visualized orbits show no acute finding. Mild scattered paranasal sinus mucosal thickening. Small left maxillary sinus mucous retention cyst. Trace fluid within the left mastoid air cells. CT CERVICAL SPINE FINDINGS Alignment: Trace C2-C3 grade 1 retrolisthesis. Skull base and vertebrae: The basion-dental and atlanto-dental intervals are maintained.No evidence of acute fracture to the cervical spine. 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 Redemonstrated  mild chronic T2 and T3 superior endplate deformities. Soft tissues and spinal canal: No prevertebral fluid or swelling. No visible canal hematoma. Disc levels: Cervical spondylosis. Most notably at C6-C7, there is advanced disc space narrowing with a prominent posterior disc osteophyte complex and left greater than right disc osteophyte ridge/uncinate hypertrophy. There is moderate spinal canal stenosis at this level with left-sided bony neural foraminal narrowing. Upper chest: No consolidation within the imaged lung apices. No visible pneumothorax. Other: Known chronic fracture deformity of the distal left clavicle, partially imaged. IMPRESSION: CT head: 1. No evidence of acute intracranial abnormality. 2. Moderate cerebral atrophy and advanced chronic small vessel ischemic disease, as described and stable as compared to the brain MRI of 02/20/2018. 3. Mild paranasal sinus mucosal thickening. CT cervical spine: 1. No evidence of acute fracture to the cervical spine. 2. Trace C2-C3 grade 1 retrolisthesis.  3. 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 4. Cervical spondylosis as described and greatest at C6-C7. 5. Redemonstrated mild chronic T2 and T3 superior endplate deformities. Electronically Signed   By: Kellie Simmering DO   On: 06/28/2020 18:27   DG Pelvis Portable  Result Date: 06/28/2020 CLINICAL DATA:  Recent falls with left hip pain, initial encounter EXAM: PORTABLE PELVIS 1-2 VIEWS COMPARISON:  None. FINDINGS: Pelvic ring demonstrates mildly displaced fractures through the superior and inferior pubic rami on the left. Proximal left femur shows no acute fracture. Mild degenerative changes of the hip joints are noted. Stellate bladder calculus is noted. IMPRESSION: Fractures of the left superior and inferior pubic rami. Bladder calculus. Electronically Signed   By: Inez Catalina M.D.   On: 06/28/2020 17:56   DG Chest Portable 1 View  Result Date: 06/28/2020 CLINICAL DATA:  Recent falls with chest pain, initial encounter EXAM: PORTABLE CHEST 1 VIEW COMPARISON:  05/04/2019 FINDINGS: Cardiac shadow is within normal limits but accentuated by the AP technique. Aortic calcifications are seen. Lungs are clear bilaterally. No focal infiltrate or effusion is seen. No acute rib fractures are noted. Old healed rib fractures on the left are seen. Prior left shoulder replacement is noted. IMPRESSION: No acute abnormality noted. Electronically Signed   By: Inez Catalina M.D.   On: 06/28/2020 17:52   ECHOCARDIOGRAM COMPLETE  Result Date: 06/29/2020    ECHOCARDIOGRAM REPORT   Patient Name:   AYUSH BOULET Date of Exam: 06/29/2020 Medical Rec #:  [id number]        Height:       70.5 in Accession #:    [id number]       Weight:       160.0 lb Date of Birth:  1927-01-04        BSA:          1.909 m Patient Age:    53 years         BP:           156/98 mmHg Patient Gender: M                HR:           81 bpm. Exam Location:  Inpatient Procedure: 2D Echo, Cardiac Doppler and Color Doppler  Indications:    Aortic Stenosis 424.1 / 135.0  History:        Patient has prior history of Echocardiogram examinations, most                 recent 12/25/2017. CAD and Previous Myocardial Infarction, Stroke  and TIA; Risk Factors:Hypertension and Non-Smoker.  Sonographer:    Vickie Epley RDCS Referring Phys: [id number] [place]  1. Left ventricular ejection fraction, by estimation, is 60 to 65%. The left ventricle has normal function. The left ventricle has no regional wall motion abnormalities. Left ventricular diastolic parameters are consistent with Grade I diastolic dysfunction (impaired relaxation).  2. Right ventricular systolic function is normal. The right ventricular size is normal.  3. The mitral valve is normal in structure. Trivial mitral valve regurgitation. No evidence of mitral stenosis.  4. The aortic valve has an indeterminant number of cusps. There is severe calcifcation of the aortic valve. There is severe thickening of the aortic valve. Aortic valve regurgitation is not visualized. Moderate aortic valve stenosis. Aortic valve area, by VTI measures 1.33 cm. Aortic valve mean gradient measures 26.5 mmHg. Aortic valve Vmax measures 3.22 m/s.  5. The inferior vena cava is normal in size with greater than 50% respiratory variability, suggesting right atrial pressure of 3 mmHg. FINDINGS  Left Ventricle: Left ventricular ejection fraction, by estimation, is 60 to 65%. The left ventricle has normal function. The left ventricle has no regional wall motion abnormalities. The left ventricular internal cavity size was normal in size. There is  no left ventricular hypertrophy. Left ventricular diastolic parameters are consistent with Grade I diastolic dysfunction (impaired relaxation). Normal left ventricular filling pressure. Right Ventricle: The right ventricular size is normal. No increase in right ventricular wall thickness. Right ventricular systolic function is normal.  Left Atrium: Left atrial size was normal in size. Right Atrium: Right atrial size was normal in size. Pericardium: There is no evidence of pericardial effusion. Mitral Valve: The mitral valve is normal in structure. Mild to moderate mitral annular calcification. Trivial mitral valve regurgitation. No evidence of mitral valve stenosis. Tricuspid Valve: The tricuspid valve is normal in structure. Tricuspid valve regurgitation is trivial. No evidence of tricuspid stenosis. Aortic Valve: The aortic valve has an indeterminant number of cusps. There is severe calcifcation of the aortic valve. There is severe thickening of the aortic valve. Aortic valve regurgitation is not visualized. Moderate aortic stenosis is present. Aortic valve mean gradient measures 26.5 mmHg. Aortic valve peak gradient measures 41.6 mmHg. Aortic valve area, by VTI measures 1.33 cm. Pulmonic Valve: The pulmonic valve was normal in structure. Pulmonic valve regurgitation is not visualized. No evidence of pulmonic stenosis. Aorta: The aortic root is normal in size and structure. Venous: The inferior vena cava is normal in size with greater than 50% respiratory variability, suggesting right atrial pressure of 3 mmHg. IAS/Shunts: No atrial level shunt detected by color flow Doppler.  LEFT VENTRICLE PLAX 2D LVIDd:         4.50 cm     Diastology LVIDs:         3.40 cm     LV e' medial:    5.52 cm/s LV PW:         0.80 cm     LV E/e' medial:  14.7 LV IVS:        0.80 cm     LV e' lateral:   7.97 cm/s LVOT diam:     2.10 cm     LV E/e' lateral: 10.2 LV SV:         106 LV SV Index:   56 LVOT Area:     3.46 cm  LV Volumes (MOD) LV vol d, MOD A2C: 69.3 ml LV vol d, MOD A4C: 71.3 ml LV vol  s, MOD A2C: 27.4 ml LV vol s, MOD A4C: 38.5 ml LV SV MOD A2C:     41.9 ml LV SV MOD A4C:     71.3 ml LV SV MOD BP:      37.6 ml RIGHT VENTRICLE RV S prime:     15.10 cm/s TAPSE (M-mode): 2.6 cm LEFT ATRIUM             Index       RIGHT ATRIUM           Index LA diam:         4.40 cm 2.31 cm/m  RA Area:     13.30 cm LA Vol (A2C):   49.0 ml 25.67 ml/m RA Volume:   31.90 ml  16.71 ml/m LA Vol (A4C):   27.4 ml 14.36 ml/m LA Biplane Vol: 40.6 ml 21.27 ml/m  AORTIC VALVE AV Area (Vmax):    1.22 cm AV Area (Vmean):   1.00 cm AV Area (VTI):     1.33 cm AV Vmax:           322.50 cm/s AV Vmean:          246.500 cm/s AV VTI:            0.794 m AV Peak Grad:      41.6 mmHg AV Mean Grad:      26.5 mmHg LVOT Vmax:         114.00 cm/s LVOT Vmean:        71.500 cm/s LVOT VTI:          0.306 m LVOT/AV VTI ratio: 0.39  AORTA Ao Root diam: 3.20 cm MITRAL VALVE MV Area (PHT): 2.66 cm     SHUNTS MV Decel Time: 285 msec     Systemic VTI:  0.31 m MV E velocity: 81.40 cm/s   Systemic Diam: 2.10 cm MV A velocity: 124.00 cm/s MV E/A ratio:  0.66 Fransico Him MD Electronically signed by Fransico Him MD Signature Date/Time: 06/29/2020/10:42:50 AM    Final    DG Femur 1V Left  Result Date: 06/28/2020 CLINICAL DATA:  Recent falls with left hip pain, initial encounter EXAM: LEFT FEMUR 1 VIEW COMPARISON:  None. FINDINGS: No acute fracture or dislocation is noted. No soft tissue abnormality is seen. IMPRESSION: No acute abnormality noted. Electronically Signed   By: Inez Catalina M.D.   On: 06/28/2020 17:53     LOS: 0 days   Antonieta Pert, MD [facility]  06/30/2020, 2:46 PM

## 2020-06-30 NOTE — Evaluation (Signed)
Clinical/Bedside Swallow Evaluation Patient Details  Name: Derek [name] MRN: [id number] Date of Birth: October 30, 1926  Today's Date: 06/30/2020 Time: SLP Start Time (ACUTE ONLY): 97 SLP Stop Time (ACUTE ONLY): 1555 SLP Time Calculation (min) (ACUTE ONLY): 9 min  Past Medical History:  Past Medical History:  Diagnosis Date  . Broken shoulder   . Cerebrovascular disease    MRI/MRA in 2007 - 50% stenosis of supraclinoid ICA, left  . Clavicle fracture   . Closed wedge compression fracture of T1 vertebra ([place]) 02/20/2018  . Complex sleep apnea syndrome    adapt SV titration EEP 6 min 6 and max 15 cm water.   . Compression fracture   . Coronary atherosclerosis of native coronary artery 07/12/2013   a. NSTEMI in setting of AF with RVR 11/14 >> BMS to CFX;  b. Nuclear (9/15):  High risk - apical lateral ischemia >> c. LHC (9/15):  pLAD 50%, D1 (smaller br 90%/larger br 80%, pDx 50-60%, dLAD 50%, prox-mid CFX stent ok, pOM1 and OM2 50-70% (jailed by stent), OM5 70-80%, mPDA 90% (CFX dsz unchanged from 2014), RCA diff dsz, EF 55%, mild ant-apical HK >>  Med Rx  . CVA (cerebral vascular accident) ([place])   . Dementia ([place])   . History of pneumonia   . Hx of echocardiogram    a. Echo 11/14): EF 55-60%, no RWMA, grade 2 DD, mild aortic stenosis (mean 15 mmHg), mild LAE  . Hypertension   . Lung nodule < 6cm on CT 02/20/2018  . MI (myocardial infarction) ([place]) 07/2013  . PAF (paroxysmal atrial fibrillation) ([facility])    hx of NSTEMI in setting of AF with RVR 07/2013;  Eliquis for AC  . Shingles    left chest  . Syncope   . TIA (transient ischemic attack) 07/12/2013   Past Surgical History:  Past Surgical History:  Procedure Laterality Date  . CATARACT EXTRACTION Bilateral   . KYPHOPLASTY    . LEFT HEART CATHETERIZATION WITH CORONARY ANGIOGRAM N/A 08/08/2013   Procedure: LEFT HEART CATHETERIZATION WITH CORONARY ANGIOGRAM;  Surgeon: Jettie Booze, MD;  Location: [facility] CATH LAB;  Service:  Cardiovascular;  Laterality: N/A;  . LEFT HEART CATHETERIZATION WITH CORONARY ANGIOGRAM N/A 05/29/2014   Procedure: LEFT HEART CATHETERIZATION WITH CORONARY ANGIOGRAM;  Surgeon: Sinclair Grooms, MD;  Location: [facility] CATH LAB;  Service: Cardiovascular;  Laterality: N/A;  . PERCUTANEOUS STENT INTERVENTION  08/08/2013   Procedure: PERCUTANEOUS STENT INTERVENTION;  Surgeon: Jettie Booze, MD;  Location: [facility] CATH LAB;  Service: Cardiovascular;;  . SHOULDER SURGERY Left   . TONSILLECTOMY     HPI:  86 year old male with history of paroxysmal atrial fibrillation on Eliquis, coronary artery disease status post BMS to CFX in 2014, mild/moderate aortic stenosis valve area 1.29 cm, echo 12/2017, dementia, CKD stage IIIa, OSA noncompliant with CPAP, HLD, HTN, GERD, with multiple falls last past 2 years, brought to the ED with loss of consciousness and fall. Initially passed nursing dysphagia screen, but later with +s/s aspiration of water.   Assessment / Plan / Recommendation Clinical Impression  [name].  He is pleasantly confused with PMH of dementia. Family denies any history of swallowing difficulty, however, he was noted with significant coughing with thin liquids with nursing yesterday. CXR remains clear and pt is without fever. He was seen with thin liquids via cup/straw, purees, and regular consistency solids. He had no s/s aspiration with any consistency. He was noted with sequential straw  sips to effectively clear mild oral residue from graham cracker. No difficulty observed, so initiate dysphagia 3 diet with thin liquids with strict precautions of sitting upright at 90 degrees, small bites/sips, alternate solids/liquids given h/o GERD, meds as tolerated--may benefit from crushed in puree. ST to follow up to ensure tolerance of this diet given prior presentation with nursing.     SLP Visit Diagnosis: Dysphagia, unspecified (R13.10)     Aspiration Risk  Mild aspiration risk    Diet Recommendation Thin liquid;Dysphagia 3 (Mech soft)   Liquid Administration via: Cup;Straw Medication Administration: Whole meds with puree Supervision: Staff to assist with self feeding Compensations: Small sips/bites;Slow rate;Follow solids with liquid Postural Changes: Seated upright at 90 degrees    Other  Recommendations Oral Care Recommendations: Oral care BID;Staff/trained caregiver to provide oral care   Follow up Recommendations None      Frequency and Duration min 1 x/week  1 week       Prognosis Prognosis for Safe Diet Advancement: Good      Swallow Study   General Date of Onset: 06/29/20 HPI: 86 year old male with history of paroxysmal atrial fibrillation on Eliquis, coronary artery disease status post BMS to CFX in 2014, mild/moderate aortic stenosis valve area 1.29 cm, echo 12/2017, dementia, CKD stage IIIa, OSA noncompliant with CPAP, HLD, HTN, GERD, with multiple falls last past 2 years, brought to the ED with loss of consciousness and fall. Initially passed nursing dysphagia screen, but later with +s/s aspiration of water. Type of Study: Bedside Swallow Evaluation Previous Swallow Assessment: 2019 CSE WNL Diet Prior to this Study: NPO Temperature Spikes Noted: No Respiratory Status: Room air History of Recent Intubation: No Behavior/Cognition: Alert;Cooperative;Pleasant mood;Confused Oral Cavity Assessment: Within Functional Limits Oral Care Completed by SLP: No Oral Cavity - Dentition: Adequate natural dentition Self-Feeding Abilities: Needs assist Patient Positioning: Upright in bed Baseline Vocal Quality: Low vocal intensity Volitional Cough: Weak;Congested    Oral/Motor/Sensory Function Overall Oral Motor/Sensory Function: Within functional limits   Ice Chips Ice chips: Within functional limits Presentation: Spoon   Thin Liquid Thin Liquid: Within functional limits Presentation: Straw;Cup;Spoon     Puree Puree: Within functional limits Presentation: Spoon   Solid     Solid: Within functional limits     Shaquavia Whisonant P. Dorsie Burich, M.S., CCC-SLP Speech-Language Pathologist Acute Rehabilitation Services Pager: [place] 06/30/2020,4:06 PM

## 2020-07-01 ENCOUNTER — Inpatient Hospital Stay (HOSPITAL_COMMUNITY): Payer: Medicare Other

## 2020-07-01 DIAGNOSIS — J189 Pneumonia, unspecified organism: Secondary | ICD-10-CM

## 2020-07-01 LAB — PROCALCITONIN: Procalcitonin: 0.59 ng/mL

## 2020-07-01 LAB — LACTIC ACID, PLASMA: Lactic Acid, Venous: 1 mmol/L (ref 0.5–1.9)

## 2020-07-01 LAB — STREP PNEUMONIAE URINARY ANTIGEN: Strep Pneumo Urinary Antigen: NEGATIVE

## 2020-07-01 MED ORDER — SODIUM CHLORIDE 0.9 % IV SOLN
2.0000 g | Freq: Every day | INTRAVENOUS | Status: DC
  Administered 2020-07-01 – 2020-07-03 (×4): 2 g via INTRAVENOUS
  Filled 2020-07-01: qty 20
  Filled 2020-07-01 (×3): qty 2
  Filled 2020-07-01: qty 20

## 2020-07-01 MED ORDER — SODIUM CHLORIDE 0.9 % IV SOLN
500.0000 mg | Freq: Every day | INTRAVENOUS | Status: DC
  Administered 2020-07-01 – 2020-07-03 (×4): 500 mg via INTRAVENOUS
  Filled 2020-07-01 (×5): qty 500

## 2020-07-01 MED ORDER — LEVOFLOXACIN IN D5W 500 MG/100ML IV SOLN
500.0000 mg | INTRAVENOUS | Status: DC
  Filled 2020-07-01: qty 100

## 2020-07-01 NOTE — Progress Notes (Signed)
Notified that patient has developed mild tachypnea and mild hypoxia, has been coughing per wife. COVID was negative. CXR obtained and concerning for developing infiltrate involving lower left lung. Plan to start antibiotics.

## 2020-07-01 NOTE — Progress Notes (Signed)
Following up from [facility] note- Pt HR 90's- 100's, Resp rate increasing- I paged Dr Myna Hidalgo with this information- orders to stop current running fluids and a chest xray has been ordered.

## 2020-07-01 NOTE — Progress Notes (Signed)
PROGRESS NOTE    Derek [name]  RSW:[id number] DOB: [birth year] DOA: 06/28/2020 PCP: Seward Carol, MD   Chief Complaint  Patient presents with   Fall   Brief Narrative: 86 year old male with history of paroxysmal atrial fibrillation on Eliquis, coronary artery disease status post BMS to CFX in 2014, mild/moderate aortic stenosis valve area 1.29 cm, echo 12/2017, dementia, CKD stage IIIa, OSA noncompliant with CPAP, HLD, HTN, GERD, with multiple falls last past 2 years, brought to the ED with loss of consciousness and fall. On admission son explained that in the past several months the patient has exhibited gradually worsening generalized weakness associated with unsteady gait and progressively worsening confusion. Early on morning on 06/28/20, while the patient was sitting on his shower chair, he experienced an unwitnessed episode of loss of consciousness and fall.  When the son ran into the bathroom it became apparent that the patient had hit his head on the side of the tub without any apparent injury.  Later in the day, as the patient was leaving the barbershop and walking to his son's car patient experienced another episode of loss of consciousness and fall, hitting his head on an embankment nearby.  During the fall the patient suffered multiple injuries including an abrasion to the scalp as well as a substantial skin tear to the left arm.  Because of these injuries and the increasing frequency of these episodes patient was brought in by EMS to [facility] emergency department for further evaluation. Patient had intermittent productive cough with clear to yellow sputum for a month.   Seen in the ED and was admitted for further management. Patient was orthostatic in the ED,was given IV fluids and admitted.  Subjective: Patient is alert awake, on 2 L nasal cannula. Afebrile overnight, patient needing oxygen to be tachypneic mild hypoxic yesterday and chest x-ray was done overnight  as he has been coughing, Covid is negative chest x-ray showed features concerning for developing infiltrates on left lower lung and antibiotics were started Denies any complaint.  He does have some coughing intermittently   Assessment & Plan:  Syncope suspect orthostatic hypotension contributing (Sitting 164/90 HR 81 to standing BP 108/67 HR 130 in the ED but patient has had syncopal episodes on and off for past 2 years occurring every 4 to 6 weeks and was evaluated by cardiology and they were cutting down on his meds.Son endorses it happens when he over exerts. Amlodipine has been discontinued, on reduced dose of metoprolol.orthostatic vitals have improved.  TTE this admission LVEF 60 to 65%,severe aortic valve thickening with moderate aortic stenosis.Continue telemetry, monitor  Closed fracture of multiple pubic rami (superior and inferior pubic rami on pelvic x-ray):Due to fall, discussed with orthopedic had CT pelvis, and was discussed with orthopedics advised conservative management.   Developing infiltrates on the left lower lung chest x-ray suspecting pneumonia question aspiration continue speech eval, IV antibiotics-ceftriaxone azithromycin. Follow-up Legionella, strep pneumo antigen negative lactic acid normal procalcitonin slightly up 0.59  Leukocytosis: Continue antibiotics for pneumonia, monitor WBC count. Downtrending.   Moderate aortic stenosis,known history echo repeated  As below  Dementia: Alert awake and oriented to place, self but not to date, is conversant, appears to be at baseline continue supportive care, fall precaution.continue PT OT will need a skilled nursing facility. Continue Aricept. Son has endorsed some progressive decline for some time.   CAD native coronary artery of native heart without angina pectoris:he denies chest pain.  Continue metoprolol, statin.  PAF: Rate  controlled.Eliquis has been discontinued given his left arm ecchymosis as well as multiple recent  falls, continue home metoprolol and monitor in telemetry. Discussed w/ son regarding holding eliquis- he understands risks/beenfits of holding and agrees.  Chronic kidney disease, stage 3a: Monitor renal function.. Recent Labs  Lab 06/28/20 1844 06/29/20 0210  BUN 25* 24*  CREATININE 1.22 1.33*   Hypomagnesemia: Replaced.  Hematoma of arm, left, initial encounter: Substantial left elbow ecchymosis with skin tears secondary to fall.  Left elbow x-ray was done continue dressing changes.  GERD continue PPI.  Goals of care DNR:Has dementia with progressive decline, will request palliative care consultation.  Nutrition: Diet Order            DIET DYS 3 Room service appropriate? No; Fluid consistency: Thin  Diet effective now                Body mass index is 22.63 kg/m. DVT prophylaxis: SCDs Start: 06/28/20 2106 Code Status:   Code Status: DNR Family Communication: plan of care discussed with patient at bedside.  I had updated patient's son over the phone yesterday.   Status is: admitted Observation. Patient remains hospitalized for ongoing management of syncope, with IV fluid hydration for dehydration and orthostatic hypotension. Dispo: The patient is from: Home              Anticipated d/c is to: SNF              Anticipated d/c date is:2 days              Patient currently is not medically stable to d/c. Consultants:see note  Procedures:see note  Left Elbow XRAY No definitive fracture seen. Soft tissue swelling along the proximal forearm. Irregularity of the proximal radial head consistent with a healed prior fracture.  TTE: IMPRESSIONS  1. Left ventricular ejection fraction, by estimation, is 60 to 65%. The  left ventricle has normal function. The left ventricle has no regional  wall motion abnormalities. Left ventricular diastolic parameters are  consistent with Grade I diastolic  dysfunction (impaired relaxation).  2. Right ventricular systolic function is  normal. The right ventricular  size is normal.  3. The mitral valve is normal in structure. Trivial mitral valve  regurgitation. No evidence of mitral stenosis.  4. The aortic valve has an indeterminant number of cusps. There is severe  calcifcation of the aortic valve. There is severe thickening of the aortic  valve. Aortic valve regurgitation is not visualized. Moderate aortic valve  stenosis. Aortic valve area,  by VTI measures 1.33 cm. Aortic valve mean gradient measures 26.5 mmHg.  Aortic valve Vmax measures 3.22 m/s.  5. The inferior vena cava is normal in size with greater than 50%  respiratory variability, suggesting right atrial pressure of 3 mmHg.  Culture/Microbiology    Component Value Date/Time   SDES URINE, CLEAN CATCH 06/28/2020 2133   SPECREQUEST NONE 06/28/2020 2133   CULT (A) 06/28/2020 2133    <10,000 COLONIES/mL INSIGNIFICANT GROWTH Performed at [facility] [address], [place], [place] [address]    REPTSTATUS 06/30/2020 FINAL 06/28/2020 2133    Other culture-see note  Medications: Scheduled Meds:  atorvastatin  40 mg Oral QPM   donepezil  10 mg Oral QHS   isosorbide mononitrate  30 mg Oral Daily   metoprolol succinate  25 mg Oral Daily   pantoprazole  40 mg Oral QAC breakfast   Continuous Infusions:  azithromycin 500 mg (07/01/20 0428)   cefTRIAXone (  ROCEPHIN)  IV 2 g (07/01/20 0352)    Antimicrobials: Anti-infectives (From admission, onward)   Start     Dose/Rate Route Frequency Ordered Stop   07/01/20 0200  levofloxacin (LEVAQUIN) IVPB 500 mg  Status:  Discontinued        500 mg 100 mL/hr over 60 Minutes Intravenous Every 24 hours 07/01/20 0054 07/01/20 0107   07/01/20 0200  cefTRIAXone (ROCEPHIN) 2 g in sodium chloride 0.9 % 100 mL IVPB        2 g 200 mL/hr over 30 Minutes Intravenous Daily at bedtime 07/01/20 0108     07/01/20 0200  azithromycin (ZITHROMAX) 500 mg in sodium chloride 0.9 % 250 mL IVPB        500 mg 250  mL/hr over 60 Minutes Intravenous Daily at bedtime 07/01/20 0108       Objective: Vitals: Today's Vitals   06/30/20 1753 06/30/20 1907 06/30/20 2130 07/01/20 0244  BP: (!) 159/89 (!) 181/75  (!) 126/58  Pulse: 82     Resp: (!) 21   18  Temp: 98.5 F (36.9 C) 98.7 F (37.1 C)  98.7 F (37.1 C)  TempSrc: Oral Oral  Oral  SpO2: 94%     Weight:    72.6 kg  Height:      PainSc:   0-No pain     Intake/Output Summary (Last 24 hours) at 07/01/2020 0851 Last data filed at 07/01/2020 0352 Gross per 24 hour  Intake 335.63 ml  Output 1150 ml  Net -814.37 ml   Filed Weights   06/28/20 1557 06/30/20 0100 07/01/20 0244  Weight: 72.6 kg 77.1 kg 72.6 kg   Weight change: -4.536 kg  Intake/Output from previous day: 10/23 0701 - 10/24 0700 In: 335.6 [I.V.:235.6; IV Piggyback:100] Out: 1150 [Urine:1150] Intake/Output this shift: No intake/output data recorded.  Examination: General exam: AAOx2,, NAD, weak appearing. HEENT:Oral mucosa moist, Ear/Nose WNL grossly, dentition normal. Respiratory system: bilaterally basal crackles,no wheezing,no use of accessory muscle Cardiovascular system: S1 & S2 +, No JVD,. Gastrointestinal system: Abdomen soft, NT,ND, BS+ Nervous System:Alert, awake, moving extremities and grossly nonfocal Extremities: No edema, distal peripheral pulses palpable.  Skin: No rashes,no icterus.  Left arm with a skin bruise and ecchymosis MSK: Normal muscle bulk,tone, power  Data Reviewed: I have personally reviewed following labs and imaging studies CBC: Recent Labs  Lab 06/28/20 1844 06/29/20 0210  WBC 14.0* 12.0*  NEUTROABS 11.6* 9.1*  HGB 14.6 13.7  HCT 44.2 40.8  MCV 90.9 90.3  PLT 215 209   Basic Metabolic Panel: Recent Labs  Lab 06/28/20 1844 06/29/20 0210  NA 141 138  K 3.8 3.8  CL 110 105  CO2 21* 24  GLUCOSE 110* 147*  BUN 25* 24*  CREATININE 1.22 1.33*  CALCIUM 8.5* 9.2  MG 1.5* 2.2   GFR: Estimated Creatinine Clearance: 37.1 mL/min  (A) (by C-G formula based on SCr of 1.33 mg/dL (H)). Liver Function Tests: Recent Labs  Lab 06/28/20 1844 06/29/20 0210  AST 25 26  ALT 20 21  ALKPHOS 73 79  BILITOT 0.8 0.9  PROT 5.6* 6.2*  ALBUMIN 3.2* 3.5   No results for input(s): LIPASE, AMYLASE in the last 168 hours. No results for input(s): AMMONIA in the last 168 hours. Coagulation Profile: Recent Labs  Lab 06/28/20 1844 06/29/20 0210  INR 1.2 1.2   Cardiac Enzymes: No results for input(s): CKTOTAL, CKMB, CKMBINDEX, TROPONINI in the last 168 hours. BNP (last 3 results) No results for input(s): PROBNP in  the last 8760 hours. HbA1C: No results for input(s): HGBA1C in the last 72 hours. CBG: Recent Labs  Lab 06/30/20 1153  GLUCAP 94   Lipid Profile: No results for input(s): CHOL, HDL, LDLCALC, TRIG, CHOLHDL, LDLDIRECT in the last 72 hours. Thyroid Function Tests: No results for input(s): TSH, T4TOTAL, FREET4, T3FREE, THYROIDAB in the last 72 hours. Anemia Panel: No results for input(s): VITAMINB12, FOLATE, FERRITIN, TIBC, IRON, RETICCTPCT in the last 72 hours. Sepsis Labs: Recent Labs  Lab 07/01/20 0324  PROCALCITON 0.59  LATICACIDVEN 1.0    Recent Results (from the past 240 hour(s))  Respiratory Panel by RT PCR (Flu A&B, Covid) - Nasopharyngeal Swab     Status: None   Collection Time: 06/28/20  7:59 PM   Specimen: Nasopharyngeal Swab  Result Value Ref Range Status   SARS Coronavirus 2 by RT PCR NEGATIVE NEGATIVE Final    Comment: (NOTE) SARS-CoV-2 target nucleic acids are NOT DETECTED.  The SARS-CoV-2 RNA is generally detectable in upper respiratoy specimens during the acute phase of infection. The lowest concentration of SARS-CoV-2 viral copies this assay can detect is 131 copies/mL. A negative result does not preclude SARS-Cov-2 infection and should not be used as the sole basis for treatment or other patient management decisions. A negative result may occur with  improper specimen  collection/handling, submission of specimen other than nasopharyngeal swab, presence of viral mutation(s) within the areas targeted by this assay, and inadequate number of viral copies (<131 copies/mL). A negative result must be combined with clinical observations, patient history, and epidemiological information. The expected result is Negative.  Fact Sheet for Patients:  PinkCheek.be  Fact Sheet for Healthcare Providers:  GravelBags.it  This test is no t yet approved or cleared by the [place] FDA and  has been authorized for detection and/or diagnosis of SARS-CoV-2 by FDA under an Emergency Use Authorization (EUA). This EUA will remain  in effect (meaning this test can be used) for the duration of the COVID-19 declaration under Section 564(b)(1) of the Act, 21 U.S.C. section 360bbb-3(b)(1), unless the authorization is terminated or revoked sooner.     Influenza A by PCR NEGATIVE NEGATIVE Final   Influenza B by PCR NEGATIVE NEGATIVE Final    Comment: (NOTE) The Xpert Xpress SARS-CoV-2/FLU/RSV assay is intended as an aid in  the diagnosis of influenza from Nasopharyngeal swab specimens and  should not be used as a sole basis for treatment. Nasal washings and  aspirates are unacceptable for Xpert Xpress SARS-CoV-2/FLU/RSV  testing.  Fact Sheet for Patients: PinkCheek.be  Fact Sheet for Healthcare Providers: GravelBags.it  This test is not yet approved or cleared by the [place] FDA and  has been authorized for detection and/or diagnosis of SARS-CoV-2 by  FDA under an Emergency Use Authorization (EUA). This EUA will remain  in effect (meaning this test can be used) for the duration of the  Covid-19 declaration under Section 564(b)(1) of the Act, 21  U.S.C. section 360bbb-3(b)(1), unless the authorization is  terminated or revoked. Performed at [facility], [place] [address] [place], [place] [address]   Culture, Urine     Status: Abnormal   Collection Time: 06/28/20  9:33 PM   Specimen: Urine, Clean Catch  Result Value Ref Range Status   Specimen Description URINE, CLEAN CATCH  Final   Special Requests NONE  Final   Culture (A)  Final    <10,000 COLONIES/mL INSIGNIFICANT GROWTH Performed at [facility], [place] [address]  [place], [place] [address]    Report Status 06/30/2020 FINAL  Final     Radiology Studies: CT PELVIS WO CONTRAST  Result Date: 06/30/2020 CLINICAL DATA:  Pelvic fracture after fall. EXAM: CT PELVIS WITHOUT CONTRAST TECHNIQUE: Multidetector CT imaging of the pelvis was performed following the standard protocol without intravenous contrast. COMPARISON:  June 28, 2020. FINDINGS: Urinary Tract: Large irregular bladder calculus is noted which measures 2.9 x 2.4 cm. Bowel:  Sigmoid diverticulosis is noted without inflammation. Vascular/Lymphatic: Aortic atherosclerosis.  No adenopathy is noted. Reproductive:  Moderate prostatic enlargement is noted. Other:  Small fat containing left inguinal hernia is noted. Musculoskeletal: Nondisplaced fractures are seen involving the left superior and inferior pubic rami. Hip and sacroiliac joints are unremarkable. No other bony abnormality is noted. IMPRESSION: 1. Nondisplaced fractures are seen involving the left superior and inferior pubic rami. 2. Large irregular bladder calculus is noted which measures 2.9 x 2.4 cm. 3. Moderate prostatic enlargement is noted. 4. Sigmoid diverticulosis without inflammation. 5. Small fat containing left inguinal hernia. 6. Aortic atherosclerosis. Aortic Atherosclerosis (ICD10-I70.0). Electronically Signed   By: Marijo Conception M.D.   On: 06/30/2020 16:33   DG CHEST PORT 1 VIEW  Result Date: 07/01/2020 CLINICAL DATA:  Acute respiratory failure with hypoxia EXAM: PORTABLE CHEST 1 VIEW COMPARISON:  06/28/2020 FINDINGS: Shallow inspiration.  Atelectasis or infiltration is developing in the left lower lung. New prominence of perihilar structures may represent infiltrates or developing central vascular congestion. No pleural effusions. No pneumothorax. Calcification of the aorta. Postoperative change in the left shoulder. IMPRESSION: Developing infiltration or atelectasis in the left lower lung and perihilar regions. Electronically Signed   By: Lucienne Capers M.D.   On: 07/01/2020 00:39   ECHOCARDIOGRAM COMPLETE  Result Date: 06/29/2020    ECHOCARDIOGRAM REPORT   Patient Name:   Derek [name] Date of Exam: 06/29/2020 Medical Rec #:  [id number]        Height:       70.5 in Accession #:    [id number]       Weight:       160.0 lb Date of Birth:  Jan 26, 1927        BSA:          1.909 m Patient Age:    30 years         BP:           156/98 mmHg Patient Gender: M                HR:           81 bpm. Exam Location:  Inpatient Procedure: 2D Echo, Cardiac Doppler and Color Doppler Indications:    Aortic Stenosis 424.1 / 135.0  History:        Patient has prior history of Echocardiogram examinations, most                 recent 12/25/2017. CAD and Previous Myocardial Infarction, Stroke                 and TIA; Risk Factors:Hypertension and Non-Smoker.  Sonographer:    Vickie Epley RDCS Referring Phys: [id number] [place]  1. Left ventricular ejection fraction, by estimation, is 60 to 65%. The left ventricle has normal function. The left ventricle has no regional wall motion abnormalities. Left ventricular diastolic parameters are consistent with Grade I diastolic dysfunction (impaired relaxation).  2. Right ventricular systolic function is normal. The right ventricular size is normal.  3.  The mitral valve is normal in structure. Trivial mitral valve regurgitation. No evidence of mitral stenosis.  4. The aortic valve has an indeterminant number of cusps. There is severe calcifcation of the aortic valve. There is severe thickening of the  aortic valve. Aortic valve regurgitation is not visualized. Moderate aortic valve stenosis. Aortic valve area, by VTI measures 1.33 cm. Aortic valve mean gradient measures 26.5 mmHg. Aortic valve Vmax measures 3.22 m/s.  5. The inferior vena cava is normal in size with greater than 50% respiratory variability, suggesting right atrial pressure of 3 mmHg. FINDINGS  Left Ventricle: Left ventricular ejection fraction, by estimation, is 60 to 65%. The left ventricle has normal function. The left ventricle has no regional wall motion abnormalities. The left ventricular internal cavity size was normal in size. There is  no left ventricular hypertrophy. Left ventricular diastolic parameters are consistent with Grade I diastolic dysfunction (impaired relaxation). Normal left ventricular filling pressure. Right Ventricle: The right ventricular size is normal. No increase in right ventricular wall thickness. Right ventricular systolic function is normal. Left Atrium: Left atrial size was normal in size. Right Atrium: Right atrial size was normal in size. Pericardium: There is no evidence of pericardial effusion. Mitral Valve: The mitral valve is normal in structure. Mild to moderate mitral annular calcification. Trivial mitral valve regurgitation. No evidence of mitral valve stenosis. Tricuspid Valve: The tricuspid valve is normal in structure. Tricuspid valve regurgitation is trivial. No evidence of tricuspid stenosis. Aortic Valve: The aortic valve has an indeterminant number of cusps. There is severe calcifcation of the aortic valve. There is severe thickening of the aortic valve. Aortic valve regurgitation is not visualized. Moderate aortic stenosis is present. Aortic valve mean gradient measures 26.5 mmHg. Aortic valve peak gradient measures 41.6 mmHg. Aortic valve area, by VTI measures 1.33 cm. Pulmonic Valve: The pulmonic valve was normal in structure. Pulmonic valve regurgitation is not visualized. No evidence of  pulmonic stenosis. Aorta: The aortic root is normal in size and structure. Venous: The inferior vena cava is normal in size with greater than 50% respiratory variability, suggesting right atrial pressure of 3 mmHg. IAS/Shunts: No atrial level shunt detected by color flow Doppler.  LEFT VENTRICLE PLAX 2D LVIDd:         4.50 cm     Diastology LVIDs:         3.40 cm     LV e' medial:    5.52 cm/s LV PW:         0.80 cm     LV E/e' medial:  14.7 LV IVS:        0.80 cm     LV e' lateral:   7.97 cm/s LVOT diam:     2.10 cm     LV E/e' lateral: 10.2 LV SV:         106 LV SV Index:   56 LVOT Area:     3.46 cm  LV Volumes (MOD) LV vol d, MOD A2C: 69.3 ml LV vol d, MOD A4C: 71.3 ml LV vol s, MOD A2C: 27.4 ml LV vol s, MOD A4C: 38.5 ml LV SV MOD A2C:     41.9 ml LV SV MOD A4C:     71.3 ml LV SV MOD BP:      37.6 ml RIGHT VENTRICLE RV S prime:     15.10 cm/s TAPSE (M-mode): 2.6 cm LEFT ATRIUM             Index  RIGHT ATRIUM           Index LA diam:        4.40 cm 2.31 cm/m  RA Area:     13.30 cm LA Vol (A2C):   49.0 ml 25.67 ml/m RA Volume:   31.90 ml  16.71 ml/m LA Vol (A4C):   27.4 ml 14.36 ml/m LA Biplane Vol: 40.6 ml 21.27 ml/m  AORTIC VALVE AV Area (Vmax):    1.22 cm AV Area (Vmean):   1.00 cm AV Area (VTI):     1.33 cm AV Vmax:           322.50 cm/s AV Vmean:          246.500 cm/s AV VTI:            0.794 m AV Peak Grad:      41.6 mmHg AV Mean Grad:      26.5 mmHg LVOT Vmax:         114.00 cm/s LVOT Vmean:        71.500 cm/s LVOT VTI:          0.306 m LVOT/AV VTI ratio: 0.39  AORTA Ao Root diam: 3.20 cm MITRAL VALVE MV Area (PHT): 2.66 cm     SHUNTS MV Decel Time: 285 msec     Systemic VTI:  0.31 m MV E velocity: 81.40 cm/s   Systemic Diam: 2.10 cm MV A velocity: 124.00 cm/s MV E/A ratio:  0.66 Fransico Him MD Electronically signed by Fransico Him MD Signature Date/Time: 06/29/2020/10:42:50 AM    Final      LOS: 1 day   Antonieta Pert, MD [facility]  07/01/2020, 8:51 AM

## 2020-07-01 NOTE — TOC Progression Note (Signed)
Transition of Care [facility]) - Progression Note    Patient Details  Name: Derek [name] MRN: [id number] Date of Birth: [birth year]  [facility] CM/SW Contact  Joanne Chars, LCSW Phone Number: 07/01/2020, 10:58 AM  Clinical Narrative:   FL2 completed.  Pt info sent out in hub.    Expected Discharge Plan: [place] Barriers to Discharge: Continued Medical Work up  Expected Discharge Plan and Services Expected Discharge Plan: [place] arrangements for the past 2 months: Single Family Home                                       Social Determinants of Health (SDOH) Interventions    Readmission Risk Interventions No flowsheet data found.
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
## 2020-07-02 DIAGNOSIS — I480 Paroxysmal atrial fibrillation: Secondary | ICD-10-CM

## 2020-07-02 DIAGNOSIS — R55 Syncope and collapse: Secondary | ICD-10-CM

## 2020-07-02 LAB — CBC
HCT: 32.4 % — ABNORMAL LOW (ref 39.0–52.0)
Hemoglobin: 10.8 g/dL — ABNORMAL LOW (ref 13.0–17.0)
MCH: 29.9 pg (ref 26.0–34.0)
MCHC: 33.3 g/dL (ref 30.0–36.0)
MCV: 89.8 fL (ref 80.0–100.0)
Platelets: 181 K/uL (ref 150–400)
RBC: 3.61 MIL/uL — ABNORMAL LOW (ref 4.22–5.81)
RDW: 13.4 % (ref 11.5–15.5)
WBC: 10.5 K/uL (ref 4.0–10.5)
nRBC: 0 % (ref 0.0–0.2)

## 2020-07-02 LAB — SARS CORONAVIRUS 2 BY RT PCR (HOSPITAL ORDER, PERFORMED IN ~~LOC~~ HOSPITAL LAB): SARS Coronavirus 2: NEGATIVE

## 2020-07-02 LAB — BASIC METABOLIC PANEL WITH GFR
Anion gap: 7 (ref 5–15)
BUN: 21 mg/dL (ref 8–23)
CO2: 24 mmol/L (ref 22–32)
Calcium: 8.4 mg/dL — ABNORMAL LOW (ref 8.9–10.3)
Chloride: 108 mmol/L (ref 98–111)
Creatinine, Ser: 1.17 mg/dL (ref 0.61–1.24)
GFR, Estimated: 59 mL/min — ABNORMAL LOW (ref >60)
Glucose, Bld: 123 mg/dL — ABNORMAL HIGH (ref 70–99)
Potassium: 3 mmol/L — ABNORMAL LOW (ref 3.5–5.1)
Sodium: 139 mmol/L (ref 135–145)

## 2020-07-02 LAB — LEGIONELLA PNEUMOPHILA SEROGP 1 UR AG: L. pneumophila Serogp 1 Ur Ag: NEGATIVE

## 2020-07-02 MED ORDER — ADULT MULTIVITAMIN W/MINERALS CH
1.0000 | ORAL_TABLET | Freq: Every day | ORAL | Status: DC
  Administered 2020-07-02 – 2020-07-04 (×3): 1 via ORAL
  Filled 2020-07-02 (×3): qty 1

## 2020-07-02 MED ORDER — ENSURE ENLIVE PO LIQD: 237.0000 mL | Freq: Three times a day (TID) | ORAL | Status: DC

## 2020-07-02 MED ORDER — APIXABAN 2.5 MG PO TABS
2.5000 mg | ORAL_TABLET | Freq: Two times a day (BID) | ORAL | Status: DC
  Administered 2020-07-02 – 2020-07-04 (×4): 2.5 mg via ORAL
  Filled 2020-07-02 (×4): qty 1

## 2020-07-02 MED ORDER — ENSURE ENLIVE PO LIQD
237.0000 mL | Freq: Two times a day (BID) | ORAL | Status: DC
  Administered 2020-07-02 – 2020-07-03 (×2): 237 mL via ORAL

## 2020-07-02 MED ORDER — POTASSIUM CHLORIDE CRYS ER 20 MEQ PO TBCR
40.0000 meq | EXTENDED_RELEASE_TABLET | Freq: Once | ORAL | Status: AC
  Administered 2020-07-02: 40 meq via ORAL
  Filled 2020-07-02: qty 2

## 2020-07-02 MED ORDER — POTASSIUM CHLORIDE CRYS ER 20 MEQ PO TBCR
20.0000 meq | EXTENDED_RELEASE_TABLET | Freq: Every day | ORAL | Status: AC
  Administered 2020-07-02 – 2020-07-03 (×2): 20 meq via ORAL
  Filled 2020-07-02 (×2): qty 1

## 2020-07-02 NOTE — Consult Note (Signed)
Patient ID: Derek [name] MRN: [id number] DOB/AGE: 03/08/1927 86 y.o.  Admit date: 06/28/2020  Admission Diagnoses:  Principal Problem:   Syncope Active Problems:   Dementia ([place])   Coronary artery disease involving native coronary artery of native heart without angina pectoris   AF (paroxysmal atrial fibrillation) (HCC)   Moderate aortic stenosis   Chronic kidney disease, stage 3a (HCC)   Hypomagnesemia   Closed fracture of multiple pubic rami (HCC)   Leukocytosis   Hematoma of arm, left, initial encounter   GERD without esophagitis   HPI: Derek [name] is a pleasant male with multiple comorbidities who had a syncopal episode and fall on 10/21, was brought o the ED and admitted; ortho was asked to consult regarding pubic rami fractures. Son is at bedside and producing history, states that the patient has baseline unsteady gait and syncopal episodes, additionally the patient frequently chooses to ambulate without walker. Patient is resting comfortably and denies any significant pain currently.   Past Medical History: Past Medical History:  Diagnosis Date  . Broken shoulder   . Cerebrovascular disease    MRI/MRA in 2007 - 50% stenosis of supraclinoid ICA, left  . Clavicle fracture   . Closed wedge compression fracture of T1 vertebra ([place]) 02/20/2018  . Complex sleep apnea syndrome    adapt SV titration EEP 6 min 6 and max 15 cm water.   . Compression fracture   . Coronary atherosclerosis of native coronary artery 07/12/2013   a. NSTEMI in setting of AF with RVR 11/14 >> BMS to CFX;  b. Nuclear (9/15):  High risk - apical lateral ischemia >> c. LHC (9/15):  pLAD 50%, D1 (smaller br 90%/larger br 80%, pDx 50-60%, dLAD 50%, prox-mid CFX stent ok, pOM1 and OM2 50-70% (jailed by stent), OM5 70-80%, mPDA 90% (CFX dsz unchanged from 2014), RCA diff dsz, EF 55%, mild ant-apical HK >>  Med Rx  . CVA (cerebral vascular accident) ([place])   . Dementia ([place])   . History of pneumonia   .  Hx of echocardiogram    a. Echo 11/14): EF 55-60%, no RWMA, grade 2 DD, mild aortic stenosis (mean 15 mmHg), mild LAE  . Hypertension   . Lung nodule < 6cm on CT 02/20/2018  . MI (myocardial infarction) ([place]) 07/2013  . PAF (paroxysmal atrial fibrillation) (HCC)    hx of NSTEMI in setting of AF with RVR 07/2013;  Eliquis for AC  . Shingles    left chest  . Syncope   . TIA (transient ischemic attack) 07/12/2013    Surgical History: Past Surgical History:  Procedure Laterality Date  . CATARACT EXTRACTION Bilateral   . KYPHOPLASTY    . LEFT HEART CATHETERIZATION WITH CORONARY ANGIOGRAM N/A 08/08/2013   Procedure: LEFT HEART CATHETERIZATION WITH CORONARY ANGIOGRAM;  Surgeon: Jettie Booze, MD;  Location: [facility] CATH LAB;  Service: Cardiovascular;  Laterality: N/A;  . LEFT HEART CATHETERIZATION WITH CORONARY ANGIOGRAM N/A 05/29/2014   Procedure: LEFT HEART CATHETERIZATION WITH CORONARY ANGIOGRAM;  Surgeon: Sinclair Grooms, MD;  Location: [facility] CATH LAB;  Service: Cardiovascular;  Laterality: N/A;  . PERCUTANEOUS STENT INTERVENTION  08/08/2013   Procedure: PERCUTANEOUS STENT INTERVENTION;  Surgeon: Jettie Booze, MD;  Location: [facility] CATH LAB;  Service: Cardiovascular;;  . SHOULDER SURGERY Left   . TONSILLECTOMY      Family History: Family History  Problem Relation Age of Onset  . Diabetes Mother   . Diabetes Father   . Cancer - Lung  Brother   . Gait disorder Brother   . Cancer Daughter   . Stroke Sister   . Heart attack Neg Hx     Social History: Social History   Socioeconomic History  . Marital status: Widowed    Spouse name: Not on file  . Number of children: 2  . Years of education: Engineer, maintenance (IT)  . Highest education level: Not on file  Occupational History  . Occupation: retired  Tobacco Use  . Smoking status: Never Smoker  . Smokeless tobacco: Never Used  Substance and Sexual Activity  . Alcohol use: Yes    Alcohol/week: 1.0 standard drink    Types: 1 Glasses of wine per  week    Comment: occasionally  . Drug use: No  . Sexual activity: Not on file  Other Topics Concern  . Not on file  Social History Narrative   Patient is widowed Derek [name]), has 2 children   Patient is right handed   Education level is 4 year degree.   Caffeine consumption is 2-4 cups daily   Social Determinants of Health   Financial Resource Strain:   . Difficulty of Paying Living Expenses: Not on file  Food Insecurity:   . Worried About Charity fundraiser in the Last Year: Not on file  . Ran Out of Food in the Last Year: Not on file  Transportation Needs:   . Lack of Transportation (Medical): Not on file  . Lack of Transportation (Non-Medical): Not on file  Physical Activity:   . Days of Exercise per Week: Not on file  . Minutes of Exercise per Session: Not on file  Stress:   . Feeling of Stress : Not on file  Social Connections:   . Frequency of Communication with Friends and Family: Not on file  . Frequency of Social Gatherings with Friends and Family: Not on file  . Attends Religious Services: Not on file  . Active Member of Clubs or Organizations: Not on file  . Attends Archivist Meetings: Not on file  . Marital Status: Not on file  Intimate Partner Violence:   . Fear of Current or Ex-Partner: Not on file  . Emotionally Abused: Not on file  . Physically Abused: Not on file  . Sexually Abused: Not on file    Allergies: Augmentin [amoxicillin-pot clavulanate], Keppra [levetiracetam], Penicillins, and Sulfa antibiotics  Medications: I have reviewed the patient's current medications.  Vital Signs: Patient Vitals for the past 24 hrs:  BP Temp Temp src Pulse Resp SpO2 Weight  07/02/20 1525 119/69 98 F (36.7 C) Oral 65 (!) 22 100 % --  07/02/20 0729 (!) 166/71 98.6 F (37 C) Oral 76 17 97 % --  07/02/20 0500 (!) 170/89 98.9 F (37.2 C) Oral 80 20 96 % 76.2 kg  07/01/20 2216 (!) 141/83 98.9 F (37.2 C) Oral 73 20 97 % --    Radiology: DG Elbow 2  Views Left  Result Date: 06/28/2020 CLINICAL DATA:  Recent fall with skin tears, initial encounter EXAM: LEFT ELBOW - 2 VIEW COMPARISON:  None. FINDINGS: Considerable soft tissue swelling is noted along the proximal forearm consistent with the recent injury. Mild olecranon spurring is seen. No acute fracture is seen. There are findings suspicious for prior healed radial head fracture. No joint effusion is noted. IMPRESSION: No definitive fracture seen. Soft tissue swelling along the proximal forearm. Irregularity of the proximal radial head consistent with a healed prior fracture. Electronically Signed   By: Elta Guadeloupe  Lukens M.D.   On: 06/28/2020 21:37   DG Knee 2 Views Left  Result Date: 06/28/2020 CLINICAL DATA:  Recent falls with left knee pain, initial encounter EXAM: LEFT KNEE - 2 VIEW COMPARISON:  None. FINDINGS: No evidence of fracture, dislocation, or joint effusion. No evidence of arthropathy or other focal bone abnormality. Soft tissues are unremarkable. IMPRESSION: No acute abnormality noted. Electronically Signed   By: Inez Catalina M.D.   On: 06/28/2020 17:56   CT Head Wo Contrast  Result Date: 06/28/2020 CLINICAL DATA:  Head trauma, minor. Additional history provided: Falls. EXAM: CT HEAD WITHOUT CONTRAST CT CERVICAL SPINE WITHOUT CONTRAST TECHNIQUE: Multidetector CT imaging of the head and cervical spine was performed following the standard protocol without intravenous contrast. Multiplanar CT image reconstructions of the cervical spine were also generated. COMPARISON:  Brain MRI 02/20/2018. Head CT 02/19/2018. CT cervical spine 03/14/2012. FINDINGS: CT HEAD FINDINGS Brain: Moderate cerebral atrophy. Advanced ill-defined hypoattenuation within the cerebral white matter is nonspecific, but consistent with chronic small vessel ischemic disease. Known chronic lacunar infarcts within the deep gray nuclei, some of which were better appreciated on the prior MRI of 02/20/2018. There is no acute  intracranial hemorrhage. No demarcated cortical infarct. No extra-axial fluid collection. No evidence of intracranial mass. No midline shift. Vascular: No hyperdense vessel.  Atherosclerotic calcifications. Skull: Normal. Negative for fracture or focal lesion. Sinuses/Orbits: Visualized orbits show no acute finding. Mild scattered paranasal sinus mucosal thickening. Small left maxillary sinus mucous retention cyst. Trace fluid within the left mastoid air cells. CT CERVICAL SPINE FINDINGS Alignment: Trace C2-C3 grade 1 retrolisthesis. Skull base and vertebrae: The basion-dental and atlanto-dental intervals are maintained.No evidence of acute fracture to the cervical spine. 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 Redemonstrated mild chronic T2 and T3 superior endplate deformities. Soft tissues and spinal canal: No prevertebral fluid or swelling. No visible canal hematoma. Disc levels: Cervical spondylosis. Most notably at C6-C7, there is advanced disc space narrowing with a prominent posterior disc osteophyte complex and left greater than right disc osteophyte ridge/uncinate hypertrophy. There is moderate spinal canal stenosis at this level with left-sided bony neural foraminal narrowing. Upper chest: No consolidation within the imaged lung apices. No visible pneumothorax. Other: Known chronic fracture deformity of the distal left clavicle, partially imaged. IMPRESSION: CT head: 1. No evidence of acute intracranial abnormality. 2. Moderate cerebral atrophy and advanced chronic small vessel ischemic disease, as described and stable as compared to the brain MRI of 02/20/2018. 3. Mild paranasal sinus mucosal thickening. CT cervical spine: 1. No evidence of acute fracture to the cervical spine. 2. Trace C2-C3 grade 1 retrolisthesis. 3. 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 4. Cervical spondylosis as described and greatest at C6-C7. 5. Redemonstrated mild chronic T2  and T3 superior endplate deformities. Electronically Signed   By: Kellie Simmering DO   On: 06/28/2020 18:27   CT Cervical Spine Wo Contrast  Result Date: 06/28/2020 CLINICAL DATA:  Head trauma, minor. Additional history provided: Falls. EXAM: CT HEAD WITHOUT CONTRAST CT CERVICAL SPINE WITHOUT CONTRAST TECHNIQUE: Multidetector CT imaging of the head and cervical spine was performed following the standard protocol without intravenous contrast. Multiplanar CT image reconstructions of the cervical spine were also generated. COMPARISON:  Brain MRI 02/20/2018. Head CT 02/19/2018. CT cervical spine 03/14/2012. FINDINGS: CT HEAD FINDINGS Brain: Moderate cerebral atrophy. Advanced ill-defined hypoattenuation within the cerebral white matter is nonspecific, but consistent with chronic small vessel ischemic disease. Known chronic lacunar infarcts within the deep gray  nuclei, some of which were better appreciated on the prior MRI of 02/20/2018. There is no acute intracranial hemorrhage. No demarcated cortical infarct. No extra-axial fluid collection. No evidence of intracranial mass. No midline shift. Vascular: No hyperdense vessel.  Atherosclerotic calcifications. Skull: Normal. Negative for fracture or focal lesion. Sinuses/Orbits: Visualized orbits show no acute finding. Mild scattered paranasal sinus mucosal thickening. Small left maxillary sinus mucous retention cyst. Trace fluid within the left mastoid air cells. CT CERVICAL SPINE FINDINGS Alignment: Trace C2-C3 grade 1 retrolisthesis. Skull base and vertebrae: The basion-dental and atlanto-dental intervals are maintained.No evidence of acute fracture to the cervical spine. 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 Redemonstrated mild chronic T2 and T3 superior endplate deformities. Soft tissues and spinal canal: No prevertebral fluid or swelling. No visible canal hematoma. Disc levels: Cervical spondylosis. Most notably at C6-C7, there is  advanced disc space narrowing with a prominent posterior disc osteophyte complex and left greater than right disc osteophyte ridge/uncinate hypertrophy. There is moderate spinal canal stenosis at this level with left-sided bony neural foraminal narrowing. Upper chest: No consolidation within the imaged lung apices. No visible pneumothorax. Other: Known chronic fracture deformity of the distal left clavicle, partially imaged. IMPRESSION: CT head: 1. No evidence of acute intracranial abnormality. 2. Moderate cerebral atrophy and advanced chronic small vessel ischemic disease, as described and stable as compared to the brain MRI of 02/20/2018. 3. Mild paranasal sinus mucosal thickening. CT cervical spine: 1. No evidence of acute fracture to the cervical spine. 2. Trace C2-C3 grade 1 retrolisthesis. 3. 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 4. Cervical spondylosis as described and greatest at C6-C7. 5. Redemonstrated mild chronic T2 and T3 superior endplate deformities. Electronically Signed   By: Kellie Simmering DO   On: 06/28/2020 18:27   CT PELVIS WO CONTRAST  Result Date: 06/30/2020 CLINICAL DATA:  Pelvic fracture after fall. EXAM: CT PELVIS WITHOUT CONTRAST TECHNIQUE: Multidetector CT imaging of the pelvis was performed following the standard protocol without intravenous contrast. COMPARISON:  June 28, 2020. FINDINGS: Urinary Tract: Large irregular bladder calculus is noted which measures 2.9 x 2.4 cm. Bowel:  Sigmoid diverticulosis is noted without inflammation. Vascular/Lymphatic: Aortic atherosclerosis.  No adenopathy is noted. Reproductive:  Moderate prostatic enlargement is noted. Other:  Small fat containing left inguinal hernia is noted. Musculoskeletal: Nondisplaced fractures are seen involving the left superior and inferior pubic rami. Hip and sacroiliac joints are unremarkable. No other bony abnormality is noted. IMPRESSION: 1. Nondisplaced fractures are seen involving the  left superior and inferior pubic rami. 2. Large irregular bladder calculus is noted which measures 2.9 x 2.4 cm. 3. Moderate prostatic enlargement is noted. 4. Sigmoid diverticulosis without inflammation. 5. Small fat containing left inguinal hernia. 6. Aortic atherosclerosis. Aortic Atherosclerosis (ICD10-I70.0). Electronically Signed   By: Marijo Conception M.D.   On: 06/30/2020 16:33   DG Pelvis Portable  Result Date: 06/28/2020 CLINICAL DATA:  Recent falls with left hip pain, initial encounter EXAM: PORTABLE PELVIS 1-2 VIEWS COMPARISON:  None. FINDINGS: Pelvic ring demonstrates mildly displaced fractures through the superior and inferior pubic rami on the left. Proximal left femur shows no acute fracture. Mild degenerative changes of the hip joints are noted. Stellate bladder calculus is noted. IMPRESSION: Fractures of the left superior and inferior pubic rami. Bladder calculus. Electronically Signed   By: Inez Catalina M.D.   On: 06/28/2020 17:56   DG CHEST PORT 1 VIEW  Result Date: 07/01/2020 CLINICAL DATA:  Acute respiratory failure with  hypoxia EXAM: PORTABLE CHEST 1 VIEW COMPARISON:  06/28/2020 FINDINGS: Shallow inspiration. Atelectasis or infiltration is developing in the left lower lung. New prominence of perihilar structures may represent infiltrates or developing central vascular congestion. No pleural effusions. No pneumothorax. Calcification of the aorta. Postoperative change in the left shoulder. IMPRESSION: Developing infiltration or atelectasis in the left lower lung and perihilar regions. Electronically Signed   By: Lucienne Capers M.D.   On: 07/01/2020 00:39   DG Chest Portable 1 View  Result Date: 06/28/2020 CLINICAL DATA:  Recent falls with chest pain, initial encounter EXAM: PORTABLE CHEST 1 VIEW COMPARISON:  05/04/2019 FINDINGS: Cardiac shadow is within normal limits but accentuated by the AP technique. Aortic calcifications are seen. Lungs are clear bilaterally. No focal  infiltrate or effusion is seen. No acute rib fractures are noted. Old healed rib fractures on the left are seen. Prior left shoulder replacement is noted. IMPRESSION: No acute abnormality noted. Electronically Signed   By: Inez Catalina M.D.   On: 06/28/2020 17:52   ECHOCARDIOGRAM COMPLETE  Result Date: 06/29/2020    ECHOCARDIOGRAM REPORT   Patient Name:   Derek [name] Date of Exam: 06/29/2020 Medical Rec #:  [id number]        Height:       70.5 in Accession #:    [id number]       Weight:       160.0 lb Date of Birth:  06-21-1927        BSA:          1.909 m Patient Age:    41 years         BP:           156/98 mmHg Patient Gender: M                HR:           81 bpm. Exam Location:  Inpatient Procedure: 2D Echo, Cardiac Doppler and Color Doppler Indications:    Aortic Stenosis 424.1 / 135.0  History:        Patient has prior history of Echocardiogram examinations, most                 recent 12/25/2017. CAD and Previous Myocardial Infarction, Stroke                 and TIA; Risk Factors:Hypertension and Non-Smoker.  Sonographer:    Vickie Epley RDCS Referring Phys: [id number] [place]  1. Left ventricular ejection fraction, by estimation, is 60 to 65%. The left ventricle has normal function. The left ventricle has no regional wall motion abnormalities. Left ventricular diastolic parameters are consistent with Grade I diastolic dysfunction (impaired relaxation).  2. Right ventricular systolic function is normal. The right ventricular size is normal.  3. The mitral valve is normal in structure. Trivial mitral valve regurgitation. No evidence of mitral stenosis.  4. The aortic valve has an indeterminant number of cusps. There is severe calcifcation of the aortic valve. There is severe thickening of the aortic valve. Aortic valve regurgitation is not visualized. Moderate aortic valve stenosis. Aortic valve area, by VTI measures 1.33 cm. Aortic valve mean gradient measures 26.5 mmHg. Aortic  valve Vmax measures 3.22 m/s.  5. The inferior vena cava is normal in size with greater than 50% respiratory variability, suggesting right atrial pressure of 3 mmHg. FINDINGS  Left Ventricle: Left ventricular ejection fraction, by estimation, is 60 to 65%. The left ventricle has normal function. The  left ventricle has no regional wall motion abnormalities. The left ventricular internal cavity size was normal in size. There is  no left ventricular hypertrophy. Left ventricular diastolic parameters are consistent with Grade I diastolic dysfunction (impaired relaxation). Normal left ventricular filling pressure. Right Ventricle: The right ventricular size is normal. No increase in right ventricular wall thickness. Right ventricular systolic function is normal. Left Atrium: Left atrial size was normal in size. Right Atrium: Right atrial size was normal in size. Pericardium: There is no evidence of pericardial effusion. Mitral Valve: The mitral valve is normal in structure. Mild to moderate mitral annular calcification. Trivial mitral valve regurgitation. No evidence of mitral valve stenosis. Tricuspid Valve: The tricuspid valve is normal in structure. Tricuspid valve regurgitation is trivial. No evidence of tricuspid stenosis. Aortic Valve: The aortic valve has an indeterminant number of cusps. There is severe calcifcation of the aortic valve. There is severe thickening of the aortic valve. Aortic valve regurgitation is not visualized. Moderate aortic stenosis is present. Aortic valve mean gradient measures 26.5 mmHg. Aortic valve peak gradient measures 41.6 mmHg. Aortic valve area, by VTI measures 1.33 cm. Pulmonic Valve: The pulmonic valve was normal in structure. Pulmonic valve regurgitation is not visualized. No evidence of pulmonic stenosis. Aorta: The aortic root is normal in size and structure. Venous: The inferior vena cava is normal in size with greater than 50% respiratory variability, suggesting right atrial  pressure of 3 mmHg. IAS/Shunts: No atrial level shunt detected by color flow Doppler.  LEFT VENTRICLE PLAX 2D LVIDd:         4.50 cm     Diastology LVIDs:         3.40 cm     LV e' medial:    5.52 cm/s LV PW:         0.80 cm     LV E/e' medial:  14.7 LV IVS:        0.80 cm     LV e' lateral:   7.97 cm/s LVOT diam:     2.10 cm     LV E/e' lateral: 10.2 LV SV:         106 LV SV Index:   56 LVOT Area:     3.46 cm  LV Volumes (MOD) LV vol d, MOD A2C: 69.3 ml LV vol d, MOD A4C: 71.3 ml LV vol s, MOD A2C: 27.4 ml LV vol s, MOD A4C: 38.5 ml LV SV MOD A2C:     41.9 ml LV SV MOD A4C:     71.3 ml LV SV MOD BP:      37.6 ml RIGHT VENTRICLE RV S prime:     15.10 cm/s TAPSE (M-mode): 2.6 cm LEFT ATRIUM             Index       RIGHT ATRIUM           Index LA diam:        4.40 cm 2.31 cm/m  RA Area:     13.30 cm LA Vol (A2C):   49.0 ml 25.67 ml/m RA Volume:   31.90 ml  16.71 ml/m LA Vol (A4C):   27.4 ml 14.36 ml/m LA Biplane Vol: 40.6 ml 21.27 ml/m  AORTIC VALVE AV Area (Vmax):    1.22 cm AV Area (Vmean):   1.00 cm AV Area (VTI):     1.33 cm AV Vmax:           322.50 cm/s AV Vmean:  246.500 cm/s AV VTI:            0.794 m AV Peak Grad:      41.6 mmHg AV Mean Grad:      26.5 mmHg LVOT Vmax:         114.00 cm/s LVOT Vmean:        71.500 cm/s LVOT VTI:          0.306 m LVOT/AV VTI ratio: 0.39  AORTA Ao Root diam: 3.20 cm MITRAL VALVE MV Area (PHT): 2.66 cm     SHUNTS MV Decel Time: 285 msec     Systemic VTI:  0.31 m MV E velocity: 81.40 cm/s   Systemic Diam: 2.10 cm MV A velocity: 124.00 cm/s MV E/A ratio:  0.66 Fransico Him MD Electronically signed by Fransico Him MD Signature Date/Time: 06/29/2020/10:42:50 AM    Final    DG Femur 1V Left  Result Date: 06/28/2020 CLINICAL DATA:  Recent falls with left hip pain, initial encounter EXAM: LEFT FEMUR 1 VIEW COMPARISON:  None. FINDINGS: No acute fracture or dislocation is noted. No soft tissue abnormality is seen. IMPRESSION: No acute abnormality noted.  Electronically Signed   By: Inez Catalina M.D.   On: 06/28/2020 17:53    Labs: Recent Labs    07/02/20 0937  WBC 10.5  RBC 3.61*  HCT 32.4*  PLT 181   Recent Labs    07/02/20 0937  NA 139  K 3.0*  CL 108  CO2 24  BUN 21  CREATININE 1.17  GLUCOSE 123*  CALCIUM 8.4*   No results for input(s): LABPT, INR in the last 72 hours.  Review of Systems: As stated in HPI  Physical Exam: Body mass index is 23.76 kg/m.  Patient Alert, resting comfortably, NAD Knee flexion, ankle plantarfelsion and dorsiflexion intact. Good quad activation. 5/5 Dorsiflexion and plantarflexion bilaterally. Distal sensation in tact. Extremities warm and well profused, palpable pulses, good cap refill. No calf tenderness, negative Homan's.  Assessment and Plan:  Fractures of the left superior and inferior pubic rami-- Non-operative management. Pain control via primary-- currently comfortable Guarded WBAT, OK for PT/OT from orthopedic standpoint. DVT PPx per primary team Dispo per primary team.  F/u as outpatient with Dr. Lyla Glassing at [facility] in 1 month  Xray findings and orthopedic plan discussed with patient and patient's son who is at bedside. All questions answered.   Estill Bamberg Eleuterio Dollar PA-C with Dr. Dorma Russell

## 2020-07-02 NOTE — TOC Progression Note (Addendum)
Transition of Care [facility]) - Progression Note    Patient Details  Name: Derek [name] MRN: [id number] Date of Birth: April 19, 1927  Transition of Care [facility]) CM/SW [place], [place] Phone Number: 07/02/2020, 11:40 AM  Clinical Narrative:     10/25 4:23PM-CSW spoke with [name] with [name]. [name] wants CSW to check back first thing in the morning to confirm that they have a bed available for patient. CSW will follow up with Janie in the morning. Family has been updated.  Pending bed at [name].  CSW will continue to follow. 10/25 3:35pm- CSW received call from patients daughter who has chosen SNF placement for patient at [facility]. CSW called [name] and left voicemail for Janie to confirm bed offer. CSW awaiting callback.  CSW will continue to follow.  10/25 2:50pm-CSW spoke with patients son Anthonio at beside and provided [employer] Compare ratings list with accepted SNF bed offers for review. Patients son told CSW that he should have SNF choice by 4:00pm today and that CSW will hear from either him or patients daughter with SNF choice.  CSW will continue to follow.  CSW spoke with patients son Anthonio and provided SNF bed offers. Patients son would like to discuss bed offers with his sister. Patients son confirmed he will call CSW back shortly with SNF choice.  SNF choice pending.  CSW will continue to follow.  Expected Discharge Plan: [place] Barriers to Discharge: Continued Medical Work up  Expected Discharge Plan and Services Expected Discharge Plan: [place] arrangements for the past 2 months: Single Family Home                                       Social Determinants of Health (SDOH) Interventions    Readmission Risk Interventions No flowsheet data found.

## 2020-07-02 NOTE — Progress Notes (Addendum)
PROGRESS NOTE    Derek [name]  AOZ:[id number] DOB: 1926-09-20 DOA: 06/28/2020 PCP: Seward Carol, MD   Chief Complaint  Patient presents with   Fall   Brief Narrative: 86 year old male with history of paroxysmal atrial fibrillation on Eliquis, coronary artery disease status post BMS to CFX in 2014, mild/moderate aortic stenosis valve area 1.29 cm, echo 12/2017, dementia, CKD stage IIIa, OSA noncompliant with CPAP, HLD, HTN, GERD, with multiple falls last past 2 years, brought to the ED with loss of consciousness and fall. On admission son explained that in the past several months the patient has exhibited gradually worsening generalized weakness associated with unsteady gait and progressively worsening confusion. Early on morning on 06/28/20, while the patient was sitting on his shower chair, he experienced an unwitnessed episode of loss of consciousness and fall.  When the son ran into the bathroom it became apparent that the patient had hit his head on the side of the tub without any apparent injury.  Later in the day, as the patient was leaving the barbershop and walking to his son's car patient experienced another episode of loss of consciousness and fall, hitting his head on an embankment nearby.  During the fall the patient suffered multiple injuries including an abrasion to the scalp as well as a substantial skin tear to the left arm.  Because of these injuries and the increasing frequency of these episodes patient was brought in by EMS to [facility] emergency department for further evaluation. Patient had intermittent productive cough with clear to yellow sputum for a month.   Seen in the ED and was admitted for further management. Patient was orthostatic in the ED,was given IV fluids and admitted.  Subjective: Alert,awake w/ baseline dementia And intermittent coughing.  Denies any new complaints.  Generalized weakness/debility   Assessment & Plan:  Syncope suspect  orthostatic hypotension contributing (Sitting 164/90 HR 81 to standing BP 108/67 HR 130 in the ED but patient has had syncopal episodes on and off for past 2 years occurring every 4 to 6 weeks and was evaluated by cardiology and they were cutting down on his meds.Son endorses it happens when he over exerts. Amlodipine has been discontinued, on reduced dose of metoprolol.orthostatic vitals have improved.  TTE this admission LVEF 60 to 65%,severe aortic valve thickening with moderate aortic stenosis.? Holter monitoring, requested cardiology input today.    Closed fracture of multiple pubic rami (superior and inferior pubic rami on pelvic x-ray): CT scan shows nondisplaced fractures left superior and inferior pubic rami. 2/2  fall, discussed with orthopedic-advised conservative management.  Continue PT OT.  Weightbearing as tolerated I discussed with Hilbert Odor from Orthopedics.  Developing infiltrates on the left lower lung chest x-ray suspecting pneumonia question aspiration respiratory status is stable.  Continue with speech follow-up.  Continue ceftriaxone azithromycin and switch to p.o. on discharge.Follow-up Legionella,strep pneumo antigen negative, procalcitonin slightly up 0.59  Hypokalemia being replaced  Leukocytosis: Improved.   Moderate aortic stenosis,known history echo repeated  As below  Dementia: He appears to be at baseline alert awake conversant.  Continue his Aricept supportive care fall precaution.   CAD native coronary artery of native heart without angina pectoris: No chest pain continue metoprolol statin.    NGE:XBMW controlled,eliquis has been discontinued given his left arm ecchymosis as well as multiple recent falls, continue home metoprolol and monitor in telemetry. Discussed w/ son regarding holding eliquis.  His primary cardiology team has been consulted and Eliquis remains on hold  Chronic kidney disease, stage 3a: Renal function is stable. Recent Labs  Lab  06/28/20 1844 06/29/20 0210 07/02/20 0937  BUN 25* 24* 21  CREATININE 1.22 1.33* 1.17   Hypomagnesemia:was replaced.  Hematoma of arm, left, initial encounter: w/ left elbow ecchymosis with skin tears secondary to fall.  Left elbow x-ray shows irregularity of the proximal radial head consistent with a healed prior fracture.    GERD continue PPI.  Goals of care DNR:Has dementia with progressive decline.  Nursing reports son is wanting booster dose for covid, inpatient vaccine team to evaluate.He is being treated for developing pneumonia.  Nutrition: Diet Order            DIET DYS 3 Room service appropriate? No; Fluid consistency: Thin  Diet effective now                Body mass index is 23.76 kg/m. DVT prophylaxis: SCDs Start: 06/28/20 2106 Code Status:   Code Status: DNR Family Communication: plan of care discussed with patient at bedside.  I had updated patient's son over the phone yesterday.   Status is: admitted Observation. Patient remains hospitalized for ongoing management of syncope, with IV fluid hydration for dehydration and orthostatic hypotension. Dispo: The patient is from: Home              Anticipated d/c is to: SNF              Anticipated d/c date is: 1 day              Patient currently is not medically stable to d/c. Consultants:see note  Procedures:see note  Left Elbow XRAY No definitive fracture seen. Soft tissue swelling along the proximal forearm. Irregularity of the proximal radial head consistent with a healed prior fracture.  TTE: IMPRESSIONS  1. Left ventricular ejection fraction, by estimation, is 60 to 65%. The  left ventricle has normal function. The left ventricle has no regional  wall motion abnormalities. Left ventricular diastolic parameters are  consistent with Grade I diastolic  dysfunction (impaired relaxation).  2. Right ventricular systolic function is normal. The right ventricular  size is normal.  3. The mitral valve  is normal in structure. Trivial mitral valve  regurgitation. No evidence of mitral stenosis.  4. The aortic valve has an indeterminant number of cusps. There is severe  calcifcation of the aortic valve. There is severe thickening of the aortic  valve. Aortic valve regurgitation is not visualized. Moderate aortic valve  stenosis. Aortic valve area,  by VTI measures 1.33 cm. Aortic valve mean gradient measures 26.5 mmHg.  Aortic valve Vmax measures 3.22 m/s.  5. The inferior vena cava is normal in size with greater than 50%  respiratory variability, suggesting right atrial pressure of 3 mmHg.  Culture/Microbiology    Component Value Date/Time   SDES URINE, CLEAN CATCH 06/28/2020 2133   SPECREQUEST NONE 06/28/2020 2133   CULT (A) 06/28/2020 2133    <10,000 COLONIES/mL INSIGNIFICANT GROWTH Performed at [facility] [address] [place], [place] [address]    REPTSTATUS 06/30/2020 FINAL 06/28/2020 2133    Other culture-see note  Medications: Scheduled Meds:  atorvastatin  40 mg Oral QPM   donepezil  10 mg Oral QHS   feeding supplement  237 mL Oral TID BM   isosorbide mononitrate  30 mg Oral Daily   metoprolol succinate  25 mg Oral Daily   pantoprazole  40 mg Oral QAC breakfast   potassium chloride  20 mEq  Oral Daily   potassium chloride  40 mEq Oral Once   Continuous Infusions:  azithromycin 500 mg (07/01/20 2214)   cefTRIAXone (ROCEPHIN)  IV 2 g (07/01/20 2333)    Antimicrobials: Anti-infectives (From admission, onward)   Start     Dose/Rate Route Frequency Ordered Stop   07/01/20 0200  levofloxacin (LEVAQUIN) IVPB 500 mg  Status:  Discontinued        500 mg 100 mL/hr over 60 Minutes Intravenous Every 24 hours 07/01/20 0054 07/01/20 0107   07/01/20 0200  cefTRIAXone (ROCEPHIN) 2 g in sodium chloride 0.9 % 100 mL IVPB        2 g 200 mL/hr over 30 Minutes Intravenous Daily at bedtime 07/01/20 0108     07/01/20 0200  azithromycin (ZITHROMAX) 500 mg in  sodium chloride 0.9 % 250 mL IVPB        500 mg 250 mL/hr over 60 Minutes Intravenous Daily at bedtime 07/01/20 0108       Objective: Vitals: Today's Vitals   07/01/20 1250 07/01/20 2216 07/02/20 0500 07/02/20 0729  BP: 128/68 (!) 141/83 (!) 170/89 (!) 166/71  Pulse: 65 73 80 76  Resp: 20 20 20 17   Temp: 98.1 F (36.7 C) 98.9 F (37.2 C) 98.9 F (37.2 C) 98.6 F (37 C)  TempSrc: Oral Oral Oral Oral  SpO2: 97% 97% 96% 97%  Weight:   76.2 kg   Height:      PainSc:  0-No pain  0-No pain    Intake/Output Summary (Last 24 hours) at 07/02/2020 1313 Last data filed at 07/01/2020 1800 Gross per 24 hour  Intake 350 ml  Output 250 ml  Net 100 ml   Filed Weights   06/30/20 0100 07/01/20 0244 07/02/20 0500  Weight: 77.1 kg 72.6 kg 76.2 kg   Weight change: 3.629 kg  Intake/Output from previous day: 10/24 0701 - 10/25 0700 In: 950 [P.O.:600; IV Piggyback:350] Out: 250 [Urine:250] Intake/Output this shift: No intake/output data recorded.  Examination:  General exam: AAOx2 , NAD, weak appearing. HEENT:Oral mucosa moist, Ear/Nose WNL grossly, dentition normal. Respiratory system: bilaterally basal crackles,no wheezing or crackles,no use of accessory muscle Cardiovascular system: S1 & S2 +, No JVD. Gastrointestinal system: Abdomen soft, NT,ND, BS+ Nervous System:Alert, awake, moving extremities and grossly nonfocal Extremities: No edema, distal peripheral pulses palpable.  Skin: No rashes,no icterus.  Left elbow skin bruise ecchymosis MSK: Normal muscle bulk,tone, power  Data Reviewed: I have personally reviewed following labs and imaging studies CBC: Recent Labs  Lab 06/28/20 1844 06/29/20 0210 07/02/20 0937  WBC 14.0* 12.0* 10.5  NEUTROABS 11.6* 9.1*  --   HGB 14.6 13.7 10.8*  HCT 44.2 40.8 32.4*  MCV 90.9 90.3 89.8  PLT 215 211 676   Basic Metabolic Panel: Recent Labs  Lab 06/28/20 1844 06/29/20 0210 07/02/20 0937  NA 141 138 139  K 3.8 3.8 3.0*  CL 110  105 108  CO2 21* 24 24  GLUCOSE 110* 147* 123*  BUN 25* 24* 21  CREATININE 1.22 1.33* 1.17  CALCIUM 8.5* 9.2 8.4*  MG 1.5* 2.2  --    GFR: Estimated Creatinine Clearance: 43.2 mL/min (by C-G formula based on SCr of 1.17 mg/dL). Liver Function Tests: Recent Labs  Lab 06/28/20 1844 06/29/20 0210  AST 25 26  ALT 20 21  ALKPHOS 73 79  BILITOT 0.8 0.9  PROT 5.6* 6.2*  ALBUMIN 3.2* 3.5   No results for input(s): LIPASE, AMYLASE in the last 168 hours. No results  for input(s): AMMONIA in the last 168 hours. Coagulation Profile: Recent Labs  Lab 06/28/20 1844 06/29/20 0210  INR 1.2 1.2   Cardiac Enzymes: No results for input(s): CKTOTAL, CKMB, CKMBINDEX, TROPONINI in the last 168 hours. BNP (last 3 results) No results for input(s): PROBNP in the last 8760 hours. HbA1C: No results for input(s): HGBA1C in the last 72 hours. CBG: Recent Labs  Lab 06/30/20 1153  GLUCAP 94   Lipid Profile: No results for input(s): CHOL, HDL, LDLCALC, TRIG, CHOLHDL, LDLDIRECT in the last 72 hours. Thyroid Function Tests: No results for input(s): TSH, T4TOTAL, FREET4, T3FREE, THYROIDAB in the last 72 hours. Anemia Panel: No results for input(s): VITAMINB12, FOLATE, FERRITIN, TIBC, IRON, RETICCTPCT in the last 72 hours. Sepsis Labs: Recent Labs  Lab 07/01/20 0324  PROCALCITON 0.59  LATICACIDVEN 1.0    Recent Results (from the past 240 hour(s))  Respiratory Panel by RT PCR (Flu A&B, Covid) - Nasopharyngeal Swab     Status: None   Collection Time: 06/28/20  7:59 PM   Specimen: Nasopharyngeal Swab  Result Value Ref Range Status   SARS Coronavirus 2 by RT PCR NEGATIVE NEGATIVE Final    Comment: (NOTE) SARS-CoV-2 target nucleic acids are NOT DETECTED.  The SARS-CoV-2 RNA is generally detectable in upper respiratoy specimens during the acute phase of infection. The lowest concentration of SARS-CoV-2 viral copies this assay can detect is 131 copies/mL. A negative result does not preclude  SARS-Cov-2 infection and should not be used as the sole basis for treatment or other patient management decisions. A negative result may occur with  improper specimen collection/handling, submission of specimen other than nasopharyngeal swab, presence of viral mutation(s) within the areas targeted by this assay, and inadequate number of viral copies (<131 copies/mL). A negative result must be combined with clinical observations, patient history, and epidemiological information. The expected result is Negative.  Fact Sheet for Patients:  PinkCheek.be  Fact Sheet for Healthcare Providers:  GravelBags.it  This test is no t yet approved or cleared by the [place] FDA and  has been authorized for detection and/or diagnosis of SARS-CoV-2 by FDA under an Emergency Use Authorization (EUA). This EUA will remain  in effect (meaning this test can be used) for the duration of the COVID-19 declaration under Section 564(b)(1) of the Act, 21 U.S.C. section 360bbb-3(b)(1), unless the authorization is terminated or revoked sooner.     Influenza A by PCR NEGATIVE NEGATIVE Final   Influenza B by PCR NEGATIVE NEGATIVE Final    Comment: (NOTE) The Xpert Xpress SARS-CoV-2/FLU/RSV assay is intended as an aid in  the diagnosis of influenza from Nasopharyngeal swab specimens and  should not be used as a sole basis for treatment. Nasal washings and  aspirates are unacceptable for Xpert Xpress SARS-CoV-2/FLU/RSV  testing.  Fact Sheet for Patients: PinkCheek.be  Fact Sheet for Healthcare Providers: GravelBags.it  This test is not yet approved or cleared by the [place] FDA and  has been authorized for detection and/or diagnosis of SARS-CoV-2 by  FDA under an Emergency Use Authorization (EUA). This EUA will remain  in effect (meaning this test can be used) for the duration of the    Covid-19 declaration under Section 564(b)(1) of the Act, 21  U.S.C. section 360bbb-3(b)(1), unless the authorization is  terminated or revoked. Performed at [facility], [place] [address], [place], [place] [address]   Culture, Urine     Status: Abnormal   Collection Time: 06/28/20  9:33 PM  Specimen: Urine, Clean Catch  Result Value Ref Range Status   Specimen Description URINE, CLEAN CATCH  Final   Special Requests NONE  Final   Culture (A)  Final    <10,000 COLONIES/mL INSIGNIFICANT GROWTH Performed at [facility], [address] [place], [place] [address]    Report Status 06/30/2020 FINAL  Final     Radiology Studies: CT PELVIS WO CONTRAST  Result Date: 06/30/2020 CLINICAL DATA:  Pelvic fracture after fall. EXAM: CT PELVIS WITHOUT CONTRAST TECHNIQUE: Multidetector CT imaging of the pelvis was performed following the standard protocol without intravenous contrast. COMPARISON:  June 28, 2020. FINDINGS: Urinary Tract: Large irregular bladder calculus is noted which measures 2.9 x 2.4 cm. Bowel:  Sigmoid diverticulosis is noted without inflammation. Vascular/Lymphatic: Aortic atherosclerosis.  No adenopathy is noted. Reproductive:  Moderate prostatic enlargement is noted. Other:  Small fat containing left inguinal hernia is noted. Musculoskeletal: Nondisplaced fractures are seen involving the left superior and inferior pubic rami. Hip and sacroiliac joints are unremarkable. No other bony abnormality is noted. IMPRESSION: 1. Nondisplaced fractures are seen involving the left superior and inferior pubic rami. 2. Large irregular bladder calculus is noted which measures 2.9 x 2.4 cm. 3. Moderate prostatic enlargement is noted. 4. Sigmoid diverticulosis without inflammation. 5. Small fat containing left inguinal hernia. 6. Aortic atherosclerosis. Aortic Atherosclerosis (ICD10-I70.0). Electronically Signed   By: Marijo Conception M.D.   On: 06/30/2020 16:33   DG CHEST PORT 1  VIEW  Result Date: 07/01/2020 CLINICAL DATA:  Acute respiratory failure with hypoxia EXAM: PORTABLE CHEST 1 VIEW COMPARISON:  06/28/2020 FINDINGS: Shallow inspiration. Atelectasis or infiltration is developing in the left lower lung. New prominence of perihilar structures may represent infiltrates or developing central vascular congestion. No pleural effusions. No pneumothorax. Calcification of the aorta. Postoperative change in the left shoulder. IMPRESSION: Developing infiltration or atelectasis in the left lower lung and perihilar regions. Electronically Signed   By: Lucienne Capers M.D.   On: 07/01/2020 00:39     LOS: 2 days   Antonieta Pert, MD [facility]  07/02/2020, 1:13 PM

## 2020-07-02 NOTE — Progress Notes (Signed)
Initial Nutrition Assessment  DOCUMENTATION CODES:   Non-severe (moderate) malnutrition in context of chronic illness  INTERVENTION:    Ensure Enlive po BID, each supplement provides 350 kcal and 20 grams of protein  MVI with minerals daily  NUTRITION DIAGNOSIS:   Moderate Malnutrition related to chronic illness (CAD, dementia, CKD) as evidenced by mild fat depletion, moderate fat depletion, mild muscle depletion, moderate muscle depletion, severe muscle depletion.  GOAL:   Patient will meet greater than or equal to 90% of their needs  MONITOR:   PO intake, Supplement acceptance  REASON FOR ASSESSMENT:   Malnutrition Screening Tool    ASSESSMENT:   86 yo male admitted with LOC s/p fall at home and another episode of LOC and fall when leaving the barber shop. PMH includes PAF, CAD, aortic stenosis, dementia, CKD stage IIIa, OSA, HLD, HTN, GERD, multiple falls.   Spoke to patient at bedside. He reports poor intake recently due to poor appetite. He denies difficulty chewing or swallowing. He lives with his son.  Meal completions documented at 75-100% of meals.    Usual weights reviewed. Patient has lost 7% of his usual weight within the past 6 months.   Labs reviewed. K 3 CBG: 94  Medications reviewed and include potassium chloride, IV Rocephin.   NUTRITION - FOCUSED PHYSICAL EXAM:    Most Recent Value  Orbital Region Moderate depletion  Upper Arm Region No depletion  Thoracic and Lumbar Region Mild depletion  Buccal Region Mild depletion  Temple Region Moderate depletion  Clavicle Bone Region Moderate depletion  Clavicle and Acromion Bone Region Moderate depletion  Scapular Bone Region Moderate depletion  Dorsal Hand Moderate depletion  Patellar Region Severe depletion  Anterior Thigh Region Severe depletion  Posterior Calf Region Mild depletion  Edema (RD Assessment) None  Hair Reviewed  Eyes Reviewed  Mouth Reviewed  Skin Reviewed  Nails Reviewed        Diet Order:   Diet Order            DIET DYS 3 Room service appropriate? No; Fluid consistency: Thin  Diet effective now                 EDUCATION NEEDS:   No education needs have been identified at this time  Skin:  Skin Assessment: Reviewed RN Assessment (skin tears to L arm and R hand)  Last BM:  10/24  Height:   Ht Readings from Last 1 Encounters:  06/28/20 5' 10.5" (1.791 m)    Weight:   Wt Readings from Last 1 Encounters:  07/02/20 76.2 kg    Ideal Body Weight:  76.8 kg  BMI:  Body mass index is 23.76 kg/m.  Estimated Nutritional Needs:   Kcal:  1800-2000  Protein:  90-105 gm  Fluid:  1.8-2 L    Lucas Mallow, RD, LDN, CNSC Please refer to Amion for contact information.

## 2020-07-02 NOTE — TOC CAGE-AID Note (Signed)
Transition of Care [facility]) - CAGE-AID Screening   Patient Details  Name: Derek [name] MRN: [id number] Date of Birth: Mar 06, 1927  Clinical Narrative: Patient denied any use of alcohol. No interventions required.    CAGE-AID Screening:   Have You Ever Felt You Ought to Cut Down on Your Drinking or Drug Use?: No Have People Annoyed You By Critizing Your Drinking Or Drug Use?: No Have You Felt Bad Or Guilty About Your Drinking Or Drug Use?: No Have You Ever Had a Drink or Used Drugs First Thing In The Morning to Steady Your Nerves or to Get Rid of a Hangover?: No CAGE-AID Score: 0

## 2020-07-02 NOTE — Discharge Summary (Addendum)
Physician Discharge Summary  Derek [name] ZOX:[id number] DOB: 06/06/1927 DOA: 06/28/2020  PCP: Seward Carol, MD  Admit date: 06/28/2020 Discharge date: 07/03/2020  Admitted From: HOME Disposition:  snf  Recommendations for Outpatient Follow-up:  1. Follow up with PCP in 1-2 weeks 2. Please obtain BMP/CBC in one week 3. Please follow up on the following pending results:  Home Health:no  Equipment/Devices: none  Discharge Condition: Stable Code Status:   Code Status: DNR Diet recommendation:  Diet Order            Diet - low sodium heart healthy           DIET DYS 3 Room service appropriate? No; Fluid consistency: Thin  Diet effective now                 Brief/Interim Summary: 86 year old male with history of paroxysmal atrial fibrillation on Eliquis, coronary artery disease status post BMS to CFX in 2014, mild/moderate aortic stenosis valve area 1.29 cm, echo 12/2017, dementia, CKD stage IIIa, OSA noncompliant with CPAP, HLD, HTN, GERD, with multiple falls last past 2 years, brought to the ED with loss of consciousness and fall. On admission son explained that in the past several months the patient has exhibited gradually worsening generalized weakness associated with unsteady gait and progressively worsening confusion. Early on morning on 06/28/20, while the patient was sitting on his shower chair, he experienced an unwitnessed episode of loss of consciousness and fall. When the son ran into the bathroom it became apparent that the patient had hit his head on the side of the tub without any apparent injury. Later in the day, as the patient was leaving the barbershop and walking to his son's car patient experienced another episode of loss of consciousness and fall, hitting his head onan embankment nearby. During the fall the patient suffered multiple injuries including an abrasion to the scalp as well as a substantial skin tear to the left arm. Because of these injuries and  the increasing frequency of these episodes patient was brought in by EMS to [facility] emergency department for further evaluation. Patient had intermittent productive cough with clear to yellow sputum for a month.   Seen in the ED and was admitted for further management. Patient was orthostatic in the ED,was given IV fluids and admitted. Patient was orthostatic and was given IV fluid hydration. He seems to have some issue with swallowing with cough and possible developing aspiration pneumonia and given antibiotics. Amlodipine has been discontinued blood pressure is stabilized.  Seen by cardiology and has further discontinued rest of the antihypertensive and after discussing with the son risks benefits resuming Eliquis low-dose Patient is being discharged to skilled nursing facility. He will receive booster prio to d/c. He had episode of a fib- metoprolol was resumed at 25 mg bdi- hr stable in 100-110 I nafternnon, disucssed with Dr Marlou Porch personally and okay to d/c to SNF today. SW updated.  Discharge Diagnoses:  Syncope suspect orthostatic hypotension/autonomic dysfunction givne his advanced age.Patient has had syncopal episodes on and off for past 2 years occurring every 4 to 6 weeks and was evaluated by cardiology and they were cutting down on his meds.off antihypertensive, after discussing risk benefits he is back on Eliquis per cardiology.TTE this admission LVEF 60 to 65%,severe aortic valve thickening with moderate aortic stenosis.continue PT OT and rehab.Did PT this am and was standing and did well, no dizziness or pelvic pain. BP 130-160s asymptomatic. Discussed w/ cardio, off metoprolol ( but  resumed 2/2 a fib w/ RVR), off imdur, off amlodipine, added compression stocking- and can consider abdomen binder if still having issues in future. Ensure adequate hydration 1.5-32 liter liquid, HOB elevate at least 6 nich, and okay to d/c SNF today.  Closed fracture of multiple pubic rami  (superior and inferior pubic rami on pelvic x-ray):Due to fall, discussed with orthopedic Dr Radene Ou conservative weightbearing as tolerated and follow-up with Dr. Baruch Gouty in 1 month   Developing infiltrates on the left lower lung chest x-ray suspecting pneumonia question aspiration-seen by speech, will complete antibiotics upon discharge respiratory status is stable.Legionellaa ag pending, strep pneumo antigen negative lactic acid normal procalcitonin slightly up 0.59  Arrythmia- atrial tach vs a fib. Off aa RX and on leiquis.  Leukocytosis:  Resolved.    Moderate aortic stenosis,known history echo repeated  As below  Dementia: He is alert awake, continue PT OT fall precaution delirium precaution, continue Aricept   CAD native coronary artery of native heart without angina pectoris:he denies chest pain.  Continue statin.  PAF: Rate controlled.bakc o neliquis per card. Metoprolol resumed at 25 mg bid per card and hr stable, okay to d/c to SNF  Chronic kidney disease, stage 3a: Monitor renal function.  Hypomagnesemia: Replaced.  Hematoma of arm, left, initial encounter: Substantial left elbow ecchymosis with skin tears secondary to fall.  Left elbow x-ray was done continue dressing changes.  Continue supportive care  GERD continue PPI.  Goals of care DNR:Has dementia with progressive decline, will request palliative care consultation.  Consults:  Cardiology, orthopedics.  Subjective: Did PT this am and was standing and did well, no dizziness or pelvic pain. BP 130-160s asymptomatic.  Discharge Exam: Vitals:   07/03/20 1305 07/03/20 1450  BP: 131/73 126/72  Pulse: 61 (!) 103  Resp: 17 19  Temp: 97.9 F (36.6 C) (!) 97.5 F (36.4 C)  SpO2: 91% 92%   General: Pt is alert, awake, not in acute distress Cardiovascular: RRR, S1/S2 +, no rubs, no gallops Respiratory: CTA bilaterally, no wheezing, no rhonchi Abdominal: Soft, NT, ND, bowel sounds  + Extremities: no edema, no cyanosis  Discharge Instructions  Discharge Instructions    Compression stockings   Complete by: As directed    Diet - low sodium heart healthy   Complete by: As directed    DIET DYS 3; Fluid consistency: Thin   Discharge instructions   Complete by: As directed    Please call call MD or return to ER for similar or worsening recurring problem that brought you to hospital or if any fever,nausea/vomiting,abdominal pain, uncontrolled pain, chest pain,  shortness of breath or any other alarming symptoms.  Please follow-up your doctor as instructed in a week time and call the office for appointment.  Please avoid alcohol, smoking, or any other illicit substance and maintain healthy habits including taking your regular medications as prescribed.  You were cared for by a hospitalist during your hospital stay. If you have any questions about your discharge medications or the care you received while you were in the hospital after you are discharged, you can call the unit and ask to speak with the hospitalist on call if the hospitalist that took care of you is not available.  Once you are discharged, your primary care physician will handle any further medical issues. Please note that NO REFILLS for any discharge medications will be authorized once you are discharged, as it is imperative that you return to your primary care physician (or establish a  relationship with a primary care physician if you do not have one) for your aftercare needs so that they can reassess your need for medications and monitor your lab values   Discharge wound care:   Complete by: As directed    Continue routine wound care of the left arm skin tear   Increase activity slowly   Complete by: As directed      Allergies as of 07/03/2020      Reactions   Augmentin [amoxicillin-pot Clavulanate] Rash   Keppra [levetiracetam]    Trouble with walking, fatigued   Penicillins Rash   Has patient had a  PCN reaction causing immediate rash, facial/tongue/throat swelling, SOB or lightheadedness with hypotension: Yes Has patient had a PCN reaction causing severe rash involving mucus membranes or skin necrosis: Unk Has patient had a PCN reaction that required hospitalization: Yes Has patient had a PCN reaction occurring within the last 10 years: No If all of the above answers are "NO", then may proceed with Cephalosporin use.   Sulfa Antibiotics Rash      Medication List    STOP taking these medications   amLODipine 5 MG tablet Commonly known as: NORVASC   isosorbide mononitrate 30 MG 24 hr tablet Commonly known as: IMDUR   metoprolol succinate 25 MG 24 hr tablet Commonly known as: TOPROL-XL     TAKE these medications   atorvastatin 40 MG tablet Commonly known as: LIPITOR TAKE 1 TABLET ONCE DAILY AT 6 PM. What changed: See the new instructions.   cefdinir 300 MG capsule Commonly known as: OMNICEF Take 1 capsule (300 mg total) by mouth 2 (two) times daily for 2 days.   PreserVision AREDS 2+Multi Vit Caps Take 1 capsule by mouth in the morning and at bedtime.   CENTRUM SILVER PO Take 1 tablet by mouth daily.   cholecalciferol 1000 units tablet Commonly known as: VITAMIN D Take 1,000 Units by mouth daily.   donepezil 10 MG tablet Commonly known as: ARICEPT Take 1 tablet (10 mg total) by mouth at bedtime.   Eliquis 2.5 MG Tabs tablet Generic drug: apixaban TAKE 1 TABLET BY MOUTH TWICE DAILY.   metoprolol tartrate 25 MG tablet Commonly known as: LOPRESSOR Take 1 tablet (25 mg total) by mouth 2 (two) times daily.   nitroGLYCERIN 0.4 MG SL tablet Commonly known as: Nitrostat Place 1 tablet (0.4 mg total) under the tongue every 5 (five) minutes as needed for chest pain.   omeprazole 20 MG capsule Commonly known as: PRILOSEC Take 20 mg by mouth daily before breakfast.            Discharge Care Instructions  (From admission, onward)         Start     Ordered    07/03/20 0000  Discharge wound care:       Comments: Continue routine wound care of the left arm skin tear   07/03/20 0954          Contact information for follow-up providers    Swinteck, Aaron Edelman, MD Follow up in 1 month(s).   Specialty: Orthopedic Surgery Contact information: [address] [place] [address] [phone]            Contact information for after-discharge care    Destination    [facility] .   Service: Skilled Chiropodist information: [place] [phone]  Allergies  Allergen Reactions  . Augmentin [Amoxicillin-Pot Clavulanate] Rash  . Keppra [Levetiracetam]     Trouble with walking, fatigued  . Penicillins Rash    Has patient had a PCN reaction causing immediate rash, facial/tongue/throat swelling, SOB or lightheadedness with hypotension: Yes Has patient had a PCN reaction causing severe rash involving mucus membranes or skin necrosis: Unk Has patient had a PCN reaction that required hospitalization: Yes Has patient had a PCN reaction occurring within the last 10 years: No If all of the above answers are "NO", then may proceed with Cephalosporin use.   . Sulfa Antibiotics Rash    The results of significant diagnostics from this hospitalization (including imaging, microbiology, ancillary and laboratory) are listed below for reference.    Microbiology: Recent Results (from the past 240 hour(s))  Respiratory Panel by RT PCR (Flu A&B, Covid) - Nasopharyngeal Swab     Status: None   Collection Time: 06/28/20  7:59 PM   Specimen: Nasopharyngeal Swab  Result Value Ref Range Status   SARS Coronavirus 2 by RT PCR NEGATIVE NEGATIVE Final    Comment: (NOTE) SARS-CoV-2 target nucleic acids are NOT DETECTED.  The SARS-CoV-2 RNA is generally detectable in upper respiratoy specimens during the acute phase of infection. The  lowest concentration of SARS-CoV-2 viral copies this assay can detect is 131 copies/mL. A negative result does not preclude SARS-Cov-2 infection and should not be used as the sole basis for treatment or other patient management decisions. A negative result may occur with  improper specimen collection/handling, submission of specimen other than nasopharyngeal swab, presence of viral mutation(s) within the areas targeted by this assay, and inadequate number of viral copies (<131 copies/mL). A negative result must be combined with clinical observations, patient history, and epidemiological information. The expected result is Negative.  Fact Sheet for Patients:  PinkCheek.be  Fact Sheet for Healthcare Providers:  GravelBags.it  This test is no t yet approved or cleared by the [place] FDA and  has been authorized for detection and/or diagnosis of SARS-CoV-2 by FDA under an Emergency Use Authorization (EUA). This EUA will remain  in effect (meaning this test can be used) for the duration of the COVID-19 declaration under Section 564(b)(1) of the Act, 21 U.S.C. section 360bbb-3(b)(1), unless the authorization is terminated or revoked sooner.     Influenza A by PCR NEGATIVE NEGATIVE Final   Influenza B by PCR NEGATIVE NEGATIVE Final    Comment: (NOTE) The Xpert Xpress SARS-CoV-2/FLU/RSV assay is intended as an aid in  the diagnosis of influenza from Nasopharyngeal swab specimens and  should not be used as a sole basis for treatment. Nasal washings and  aspirates are unacceptable for Xpert Xpress SARS-CoV-2/FLU/RSV  testing.  Fact Sheet for Patients: PinkCheek.be  Fact Sheet for Healthcare Providers: GravelBags.it  This test is not yet approved or cleared by the [place] FDA and  has been authorized for detection and/or diagnosis of SARS-CoV-2 by  FDA under  an Emergency Use Authorization (EUA). This EUA will remain  in effect (meaning this test can be used) for the duration of the  Covid-19 declaration under Section 564(b)(1) of the Act, 21  U.S.C. section 360bbb-3(b)(1), unless the authorization is  terminated or revoked. Performed at [facility], [place] [address], [place], [place] [address]   Culture, Urine     Status: Abnormal   Collection Time: 06/28/20  9:33 PM   Specimen: Urine, Clean Catch  Result Value Ref Range Status   Specimen  Description URINE, CLEAN CATCH  Final   Special Requests NONE  Final   Culture (A)  Final    <10,000 COLONIES/mL INSIGNIFICANT GROWTH Performed at [facility], [place] [address], [place], [place] [address]    Report Status 06/30/2020 FINAL  Final  SARS Coronavirus 2 by RT PCR (hospital order, performed in [facility] hospital lab) Nasopharyngeal Nasopharyngeal Swab     Status: None   Collection Time: 07/02/20 11:57 AM   Specimen: Nasopharyngeal Swab  Result Value Ref Range Status   SARS Coronavirus 2 NEGATIVE NEGATIVE Final    Comment: (NOTE) SARS-CoV-2 target nucleic acids are NOT DETECTED.  The SARS-CoV-2 RNA is generally detectable in upper and lower respiratory specimens during the acute phase of infection. The lowest concentration of SARS-CoV-2 viral copies this assay can detect is 250 copies / mL. A negative result does not preclude SARS-CoV-2 infection and should not be used as the sole basis for treatment or other patient management decisions.  A negative result may occur with improper specimen collection / handling, submission of specimen other than nasopharyngeal swab, presence of viral mutation(s) within the areas targeted by this assay, and inadequate number of viral copies (<250 copies / mL). A negative result must be combined with clinical observations, patient history, and epidemiological information.  Fact Sheet for Patients:    StrictlyIdeas.no  Fact Sheet for Healthcare Providers: BankingDealers.co.za  This test is not yet approved or  cleared by the [place] FDA and has been authorized for detection and/or diagnosis of SARS-CoV-2 by FDA under an Emergency Use Authorization (EUA).  This EUA will remain in effect (meaning this test can be used) for the duration of the COVID-19 declaration under Section 564(b)(1) of the Act, 21 U.S.C. section 360bbb-3(b)(1), unless the authorization is terminated or revoked sooner.  Performed at [facility], [place] [address] [place], [place] [address]     Procedures/Studies: DG Elbow 2 Views Left  Result Date: 06/28/2020 CLINICAL DATA:  Recent fall with skin tears, initial encounter EXAM: LEFT ELBOW - 2 VIEW COMPARISON:  None. FINDINGS: Considerable soft tissue swelling is noted along the proximal forearm consistent with the recent injury. Mild olecranon spurring is seen. No acute fracture is seen. There are findings suspicious for prior healed radial head fracture. No joint effusion is noted. IMPRESSION: No definitive fracture seen. Soft tissue swelling along the proximal forearm. Irregularity of the proximal radial head consistent with a healed prior fracture. Electronically Signed   By: Inez Catalina M.D.   On: 06/28/2020 21:37   DG Knee 2 Views Left  Result Date: 06/28/2020 CLINICAL DATA:  Recent falls with left knee pain, initial encounter EXAM: LEFT KNEE - 2 VIEW COMPARISON:  None. FINDINGS: No evidence of fracture, dislocation, or joint effusion. No evidence of arthropathy or other focal bone abnormality. Soft tissues are unremarkable. IMPRESSION: No acute abnormality noted. Electronically Signed   By: Inez Catalina M.D.   On: 06/28/2020 17:56   CT Head Wo Contrast  Result Date: 06/28/2020 CLINICAL DATA:  Head trauma, minor. Additional history provided: Falls. EXAM: CT HEAD WITHOUT CONTRAST CT CERVICAL SPINE  WITHOUT CONTRAST TECHNIQUE: Multidetector CT imaging of the head and cervical spine was performed following the standard protocol without intravenous contrast. Multiplanar CT image reconstructions of the cervical spine were also generated. COMPARISON:  Brain MRI 02/20/2018. Head CT 02/19/2018. CT cervical spine 03/14/2012. FINDINGS: CT HEAD FINDINGS Brain: Moderate cerebral atrophy. Advanced ill-defined hypoattenuation within the cerebral white matter is nonspecific, but  consistent with chronic small vessel ischemic disease. Known chronic lacunar infarcts within the deep gray nuclei, some of which were better appreciated on the prior MRI of 02/20/2018. There is no acute intracranial hemorrhage. No demarcated cortical infarct. No extra-axial fluid collection. No evidence of intracranial mass. No midline shift. Vascular: No hyperdense vessel.  Atherosclerotic calcifications. Skull: Normal. Negative for fracture or focal lesion. Sinuses/Orbits: Visualized orbits show no acute finding. Mild scattered paranasal sinus mucosal thickening. Small left maxillary sinus mucous retention cyst. Trace fluid within the left mastoid air cells. CT CERVICAL SPINE FINDINGS Alignment: Trace C2-C3 grade 1 retrolisthesis. Skull base and vertebrae: The basion-dental and atlanto-dental intervals are maintained.No evidence of acute fracture to the cervical spine. 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 Redemonstrated mild chronic T2 and T3 superior endplate deformities. Soft tissues and spinal canal: No prevertebral fluid or swelling. No visible canal hematoma. Disc levels: Cervical spondylosis. Most notably at C6-C7, there is advanced disc space narrowing with a prominent posterior disc osteophyte complex and left greater than right disc osteophyte ridge/uncinate hypertrophy. There is moderate spinal canal stenosis at this level with left-sided bony neural foraminal narrowing. Upper chest: No consolidation within  the imaged lung apices. No visible pneumothorax. Other: Known chronic fracture deformity of the distal left clavicle, partially imaged. IMPRESSION: CT head: 1. No evidence of acute intracranial abnormality. 2. Moderate cerebral atrophy and advanced chronic small vessel ischemic disease, as described and stable as compared to the brain MRI of 02/20/2018. 3. Mild paranasal sinus mucosal thickening. CT cervical spine: 1. No evidence of acute fracture to the cervical spine. 2. Trace C2-C3 grade 1 retrolisthesis. 3. 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 4. Cervical spondylosis as described and greatest at C6-C7. 5. Redemonstrated mild chronic T2 and T3 superior endplate deformities. Electronically Signed   By: Kellie Simmering DO   On: 06/28/2020 18:27   CT Cervical Spine Wo Contrast  Result Date: 06/28/2020 CLINICAL DATA:  Head trauma, minor. Additional history provided: Falls. EXAM: CT HEAD WITHOUT CONTRAST CT CERVICAL SPINE WITHOUT CONTRAST TECHNIQUE: Multidetector CT imaging of the head and cervical spine was performed following the standard protocol without intravenous contrast. Multiplanar CT image reconstructions of the cervical spine were also generated. COMPARISON:  Brain MRI 02/20/2018. Head CT 02/19/2018. CT cervical spine 03/14/2012. FINDINGS: CT HEAD FINDINGS Brain: Moderate cerebral atrophy. Advanced ill-defined hypoattenuation within the cerebral white matter is nonspecific, but consistent with chronic small vessel ischemic disease. Known chronic lacunar infarcts within the deep gray nuclei, some of which were better appreciated on the prior MRI of 02/20/2018. There is no acute intracranial hemorrhage. No demarcated cortical infarct. No extra-axial fluid collection. No evidence of intracranial mass. No midline shift. Vascular: No hyperdense vessel.  Atherosclerotic calcifications. Skull: Normal. Negative for fracture or focal lesion. Sinuses/Orbits: Visualized orbits show no  acute finding. Mild scattered paranasal sinus mucosal thickening. Small left maxillary sinus mucous retention cyst. Trace fluid within the left mastoid air cells. CT CERVICAL SPINE FINDINGS Alignment: Trace C2-C3 grade 1 retrolisthesis. Skull base and vertebrae: The basion-dental and atlanto-dental intervals are maintained.No evidence of acute fracture to the cervical spine. 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 Redemonstrated mild chronic T2 and T3 superior endplate deformities. Soft tissues and spinal canal: No prevertebral fluid or swelling. No visible canal hematoma. Disc levels: Cervical spondylosis. Most notably at C6-C7, there is advanced disc space narrowing with a prominent posterior disc osteophyte complex and left greater than right disc osteophyte ridge/uncinate hypertrophy. There is  moderate spinal canal stenosis at this level with left-sided bony neural foraminal narrowing. Upper chest: No consolidation within the imaged lung apices. No visible pneumothorax. Other: Known chronic fracture deformity of the distal left clavicle, partially imaged. IMPRESSION: CT head: 1. No evidence of acute intracranial abnormality. 2. Moderate cerebral atrophy and advanced chronic small vessel ischemic disease, as described and stable as compared to the brain MRI of 02/20/2018. 3. Mild paranasal sinus mucosal thickening. CT cervical spine: 1. No evidence of acute fracture to the cervical spine. 2. Trace C2-C3 grade 1 retrolisthesis. 3. 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 4. Cervical spondylosis as described and greatest at C6-C7. 5. Redemonstrated mild chronic T2 and T3 superior endplate deformities. Electronically Signed   By: Kellie Simmering DO   On: 06/28/2020 18:27   CT PELVIS WO CONTRAST  Result Date: 06/30/2020 CLINICAL DATA:  Pelvic fracture after fall. EXAM: CT PELVIS WITHOUT CONTRAST TECHNIQUE: Multidetector CT imaging of the pelvis was performed  following the standard protocol without intravenous contrast. COMPARISON:  June 28, 2020. FINDINGS: Urinary Tract: Large irregular bladder calculus is noted which measures 2.9 x 2.4 cm. Bowel:  Sigmoid diverticulosis is noted without inflammation. Vascular/Lymphatic: Aortic atherosclerosis.  No adenopathy is noted. Reproductive:  Moderate prostatic enlargement is noted. Other:  Small fat containing left inguinal hernia is noted. Musculoskeletal: Nondisplaced fractures are seen involving the left superior and inferior pubic rami. Hip and sacroiliac joints are unremarkable. No other bony abnormality is noted. IMPRESSION: 1. Nondisplaced fractures are seen involving the left superior and inferior pubic rami. 2. Large irregular bladder calculus is noted which measures 2.9 x 2.4 cm. 3. Moderate prostatic enlargement is noted. 4. Sigmoid diverticulosis without inflammation. 5. Small fat containing left inguinal hernia. 6. Aortic atherosclerosis. Aortic Atherosclerosis (ICD10-I70.0). Electronically Signed   By: Marijo Conception M.D.   On: 06/30/2020 16:33   DG Pelvis Portable  Result Date: 06/28/2020 CLINICAL DATA:  Recent falls with left hip pain, initial encounter EXAM: PORTABLE PELVIS 1-2 VIEWS COMPARISON:  None. FINDINGS: Pelvic ring demonstrates mildly displaced fractures through the superior and inferior pubic rami on the left. Proximal left femur shows no acute fracture. Mild degenerative changes of the hip joints are noted. Stellate bladder calculus is noted. IMPRESSION: Fractures of the left superior and inferior pubic rami. Bladder calculus. Electronically Signed   By: Inez Catalina M.D.   On: 06/28/2020 17:56   DG CHEST PORT 1 VIEW  Result Date: 07/01/2020 CLINICAL DATA:  Acute respiratory failure with hypoxia EXAM: PORTABLE CHEST 1 VIEW COMPARISON:  06/28/2020 FINDINGS: Shallow inspiration. Atelectasis or infiltration is developing in the left lower lung. New prominence of perihilar structures may  represent infiltrates or developing central vascular congestion. No pleural effusions. No pneumothorax. Calcification of the aorta. Postoperative change in the left shoulder. IMPRESSION: Developing infiltration or atelectasis in the left lower lung and perihilar regions. Electronically Signed   By: Lucienne Capers M.D.   On: 07/01/2020 00:39   DG Chest Portable 1 View  Result Date: 06/28/2020 CLINICAL DATA:  Recent falls with chest pain, initial encounter EXAM: PORTABLE CHEST 1 VIEW COMPARISON:  05/04/2019 FINDINGS: Cardiac shadow is within normal limits but accentuated by the AP technique. Aortic calcifications are seen. Lungs are clear bilaterally. No focal infiltrate or effusion is seen. No acute rib fractures are noted. Old healed rib fractures on the left are seen. Prior left shoulder replacement is noted. IMPRESSION: No acute abnormality noted. Electronically Signed   By: Linus Mako.D.  On: 06/28/2020 17:52   ECHOCARDIOGRAM COMPLETE  Result Date: 06/29/2020    ECHOCARDIOGRAM REPORT   Patient Name:   MAHONRI SEIDEN Date of Exam: 06/29/2020 Medical Rec #:  [id number]        Height:       70.5 in Accession #:    [id number]       Weight:       160.0 lb Date of Birth:  [birth year]        BSA:          1.909 m Patient Age:    86 years         BP:           156/98 mmHg Patient Gender: M                HR:           81 bpm. Exam Location:  Inpatient Procedure: 2D Echo, Cardiac Doppler and Color Doppler Indications:    Aortic Stenosis 424.1 / 135.0  History:        Patient has prior history of Echocardiogram examinations, most                 recent 12/25/2017. CAD and Previous Myocardial Infarction, Stroke                 and TIA; Risk Factors:Hypertension and Non-Smoker.  Sonographer:    Vickie Epley RDCS Referring Phys: [id number] [place]  1. Left ventricular ejection fraction, by estimation, is 60 to 65%. The left ventricle has normal function. The left ventricle has no regional  wall motion abnormalities. Left ventricular diastolic parameters are consistent with Grade I diastolic dysfunction (impaired relaxation).  2. Right ventricular systolic function is normal. The right ventricular size is normal.  3. The mitral valve is normal in structure. Trivial mitral valve regurgitation. No evidence of mitral stenosis.  4. The aortic valve has an indeterminant number of cusps. There is severe calcifcation of the aortic valve. There is severe thickening of the aortic valve. Aortic valve regurgitation is not visualized. Moderate aortic valve stenosis. Aortic valve area, by VTI measures 1.33 cm. Aortic valve mean gradient measures 26.5 mmHg. Aortic valve Vmax measures 3.22 m/s.  5. The inferior vena cava is normal in size with greater than 50% respiratory variability, suggesting right atrial pressure of 3 mmHg. FINDINGS  Left Ventricle: Left ventricular ejection fraction, by estimation, is 60 to 65%. The left ventricle has normal function. The left ventricle has no regional wall motion abnormalities. The left ventricular internal cavity size was normal in size. There is  no left ventricular hypertrophy. Left ventricular diastolic parameters are consistent with Grade I diastolic dysfunction (impaired relaxation). Normal left ventricular filling pressure. Right Ventricle: The right ventricular size is normal. No increase in right ventricular wall thickness. Right ventricular systolic function is normal. Left Atrium: Left atrial size was normal in size. Right Atrium: Right atrial size was normal in size. Pericardium: There is no evidence of pericardial effusion. Mitral Valve: The mitral valve is normal in structure. Mild to moderate mitral annular calcification. Trivial mitral valve regurgitation. No evidence of mitral valve stenosis. Tricuspid Valve: The tricuspid valve is normal in structure. Tricuspid valve regurgitation is trivial. No evidence of tricuspid stenosis. Aortic Valve: The aortic valve  has an indeterminant number of cusps. There is severe calcifcation of the aortic valve. There is severe thickening of the aortic valve. Aortic valve regurgitation is not visualized. Moderate aortic stenosis is  present. Aortic valve mean gradient measures 26.5 mmHg. Aortic valve peak gradient measures 41.6 mmHg. Aortic valve area, by VTI measures 1.33 cm. Pulmonic Valve: The pulmonic valve was normal in structure. Pulmonic valve regurgitation is not visualized. No evidence of pulmonic stenosis. Aorta: The aortic root is normal in size and structure. Venous: The inferior vena cava is normal in size with greater than 50% respiratory variability, suggesting right atrial pressure of 3 mmHg. IAS/Shunts: No atrial level shunt detected by color flow Doppler.  LEFT VENTRICLE PLAX 2D LVIDd:         4.50 cm     Diastology LVIDs:         3.40 cm     LV e' medial:    5.52 cm/s LV PW:         0.80 cm     LV E/e' medial:  14.7 LV IVS:        0.80 cm     LV e' lateral:   7.97 cm/s LVOT diam:     2.10 cm     LV E/e' lateral: 10.2 LV SV:         106 LV SV Index:   56 LVOT Area:     3.46 cm  LV Volumes (MOD) LV vol d, MOD A2C: 69.3 ml LV vol d, MOD A4C: 71.3 ml LV vol s, MOD A2C: 27.4 ml LV vol s, MOD A4C: 38.5 ml LV SV MOD A2C:     41.9 ml LV SV MOD A4C:     71.3 ml LV SV MOD BP:      37.6 ml RIGHT VENTRICLE RV S prime:     15.10 cm/s TAPSE (M-mode): 2.6 cm LEFT ATRIUM             Index       RIGHT ATRIUM           Index LA diam:        4.40 cm 2.31 cm/m  RA Area:     13.30 cm LA Vol (A2C):   49.0 ml 25.67 ml/m RA Volume:   31.90 ml  16.71 ml/m LA Vol (A4C):   27.4 ml 14.36 ml/m LA Biplane Vol: 40.6 ml 21.27 ml/m  AORTIC VALVE AV Area (Vmax):    1.22 cm AV Area (Vmean):   1.00 cm AV Area (VTI):     1.33 cm AV Vmax:           322.50 cm/s AV Vmean:          246.500 cm/s AV VTI:            0.794 m AV Peak Grad:      41.6 mmHg AV Mean Grad:      26.5 mmHg LVOT Vmax:         114.00 cm/s LVOT Vmean:        71.500 cm/s LVOT VTI:           0.306 m LVOT/AV VTI ratio: 0.39  AORTA Ao Root diam: 3.20 cm MITRAL VALVE MV Area (PHT): 2.66 cm     SHUNTS MV Decel Time: 285 msec     Systemic VTI:  0.31 m MV E velocity: 81.40 cm/s   Systemic Diam: 2.10 cm MV A velocity: 124.00 cm/s MV E/A ratio:  0.66 Fransico Him MD Electronically signed by Fransico Him MD Signature Date/Time: 06/29/2020/10:42:50 AM    Final    DG Femur 1V Left  Result Date: 06/28/2020 CLINICAL DATA:  Recent falls with left  hip pain, initial encounter EXAM: LEFT FEMUR 1 VIEW COMPARISON:  None. FINDINGS: No acute fracture or dislocation is noted. No soft tissue abnormality is seen. IMPRESSION: No acute abnormality noted. Electronically Signed   By: Inez Catalina M.D.   On: 06/28/2020 17:53    Labs: BNP (last 3 results) Recent Labs    06/28/20 1849  BNP 786.7*   Basic Metabolic Panel: Recent Labs  Lab 06/28/20 1844 06/29/20 0210 07/02/20 0937 07/03/20 0845  NA 141 138 139 138  K 3.8 3.8 3.0* 3.4*  CL 110 105 108 103  CO2 21* 24 24 24   GLUCOSE 110* 147* 123* 109*  BUN 25* 24* 21 18  CREATININE 1.22 1.33* 1.17 0.98  CALCIUM 8.5* 9.2 8.4* 8.7*  MG 1.5* 2.2  --   --    Liver Function Tests: Recent Labs  Lab 06/28/20 1844 06/29/20 0210  AST 25 26  ALT 20 21  ALKPHOS 73 79  BILITOT 0.8 0.9  PROT 5.6* 6.2*  ALBUMIN 3.2* 3.5   No results for input(s): LIPASE, AMYLASE in the last 168 hours. No results for input(s): AMMONIA in the last 168 hours. CBC: Recent Labs  Lab 06/28/20 1844 06/29/20 0210 07/02/20 0937  WBC 14.0* 12.0* 10.5  NEUTROABS 11.6* 9.1*  --   HGB 14.6 13.7 10.8*  HCT 44.2 40.8 32.4*  MCV 90.9 90.3 89.8  PLT 215 211 181   Cardiac Enzymes: No results for input(s): CKTOTAL, CKMB, CKMBINDEX, TROPONINI in the last 168 hours. BNP: Invalid input(s): POCBNP CBG: Recent Labs  Lab 06/30/20 1153  GLUCAP 94   D-Dimer No results for input(s): DDIMER in the last 72 hours. Hgb A1c No results for input(s): HGBA1C in the last 72  hours. Lipid Profile No results for input(s): CHOL, HDL, LDLCALC, TRIG, CHOLHDL, LDLDIRECT in the last 72 hours. Thyroid function studies No results for input(s): TSH, T4TOTAL, T3FREE, THYROIDAB in the last 72 hours.  Invalid input(s): FREET3 Anemia work up No results for input(s): VITAMINB12, FOLATE, FERRITIN, TIBC, IRON, RETICCTPCT in the last 72 hours. Urinalysis    Component Value Date/Time   COLORURINE YELLOW 06/28/2020 2133   APPEARANCEUR HAZY (A) 06/28/2020 2133   LABSPEC 1.018 06/28/2020 2133   PHURINE 5.0 06/28/2020 2133   GLUCOSEU NEGATIVE 06/28/2020 2133   HGBUR NEGATIVE 06/28/2020 2133   BILIRUBINUR NEGATIVE 06/28/2020 2133   KETONESUR 20 (A) 06/28/2020 2133   PROTEINUR 100 (A) 06/28/2020 2133   UROBILINOGEN 0.2 12/11/2014 1450   NITRITE NEGATIVE 06/28/2020 2133   LEUKOCYTESUR NEGATIVE 06/28/2020 2133   Sepsis Labs Invalid input(s): PROCALCITONIN,  WBC,  LACTICIDVEN Microbiology Recent Results (from the past 240 hour(s))  Respiratory Panel by RT PCR (Flu A&B, Covid) - Nasopharyngeal Swab     Status: None   Collection Time: 06/28/20  7:59 PM   Specimen: Nasopharyngeal Swab  Result Value Ref Range Status   SARS Coronavirus 2 by RT PCR NEGATIVE NEGATIVE Final    Comment: (NOTE) SARS-CoV-2 target nucleic acids are NOT DETECTED.  The SARS-CoV-2 RNA is generally detectable in upper respiratoy specimens during the acute phase of infection. The lowest concentration of SARS-CoV-2 viral copies this assay can detect is 131 copies/mL. A negative result does not preclude SARS-Cov-2 infection and should not be used as the sole basis for treatment or other patient management decisions. A negative result may occur with  improper specimen collection/handling, submission of specimen other than nasopharyngeal swab, presence of viral mutation(s) within the areas targeted by this assay, and inadequate number  of viral copies (<131 copies/mL). A negative result must be combined  with clinical observations, patient history, and epidemiological information. The expected result is Negative.  Fact Sheet for Patients:  PinkCheek.be  Fact Sheet for Healthcare Providers:  GravelBags.it  This test is no t yet approved or cleared by the [place] FDA and  has been authorized for detection and/or diagnosis of SARS-CoV-2 by FDA under an Emergency Use Authorization (EUA). This EUA will remain  in effect (meaning this test can be used) for the duration of the COVID-19 declaration under Section 564(b)(1) of the Act, 21 U.S.C. section 360bbb-3(b)(1), unless the authorization is terminated or revoked sooner.     Influenza A by PCR NEGATIVE NEGATIVE Final   Influenza B by PCR NEGATIVE NEGATIVE Final    Comment: (NOTE) The Xpert Xpress SARS-CoV-2/FLU/RSV assay is intended as an aid in  the diagnosis of influenza from Nasopharyngeal swab specimens and  should not be used as a sole basis for treatment. Nasal washings and  aspirates are unacceptable for Xpert Xpress SARS-CoV-2/FLU/RSV  testing.  Fact Sheet for Patients: PinkCheek.be  Fact Sheet for Healthcare Providers: GravelBags.it  This test is not yet approved or cleared by the [place] FDA and  has been authorized for detection and/or diagnosis of SARS-CoV-2 by  FDA under an Emergency Use Authorization (EUA). This EUA will remain  in effect (meaning this test can be used) for the duration of the  Covid-19 declaration under Section 564(b)(1) of the Act, 21  U.S.C. section 360bbb-3(b)(1), unless the authorization is  terminated or revoked. Performed at [facility], [place] [address], [place], [place] [address]   Culture, Urine     Status: Abnormal   Collection Time: 06/28/20  9:33 PM   Specimen: Urine, Clean Catch  Result Value Ref Range Status   Specimen Description URINE, CLEAN CATCH   Final   Special Requests NONE  Final   Culture (A)  Final    <10,000 COLONIES/mL INSIGNIFICANT GROWTH Performed at [facility], [place] [address], [place], [place] [address]    Report Status 06/30/2020 FINAL  Final  SARS Coronavirus 2 by RT PCR (hospital order, performed in [facility] hospital lab) Nasopharyngeal Nasopharyngeal Swab     Status: None   Collection Time: 07/02/20 11:57 AM   Specimen: Nasopharyngeal Swab  Result Value Ref Range Status   SARS Coronavirus 2 NEGATIVE NEGATIVE Final    Comment: (NOTE) SARS-CoV-2 target nucleic acids are NOT DETECTED.  The SARS-CoV-2 RNA is generally detectable in upper and lower respiratory specimens during the acute phase of infection. The lowest concentration of SARS-CoV-2 viral copies this assay can detect is 250 copies / mL. A negative result does not preclude SARS-CoV-2 infection and should not be used as the sole basis for treatment or other patient management decisions.  A negative result may occur with improper specimen collection / handling, submission of specimen other than nasopharyngeal swab, presence of viral mutation(s) within the areas targeted by this assay, and inadequate number of viral copies (<250 copies / mL). A negative result must be combined with clinical observations, patient history, and epidemiological information.  Fact Sheet for Patients:   StrictlyIdeas.no  Fact Sheet for Healthcare Providers: BankingDealers.co.za  This test is not yet approved or  cleared by the [place] FDA and has been authorized for detection and/or diagnosis of SARS-CoV-2 by FDA under an Emergency Use Authorization (EUA).  This EUA will remain in effect (meaning this test can be used)  for the duration of the COVID-19 declaration under Section 564(b)(1) of the Act, 21 U.S.C. section 360bbb-3(b)(1), unless the authorization is terminated or revoked sooner.  Performed at [facility], [place] [address], [place], [place] [address]      Time coordinating discharge: 35  minutes  SIGNED: Antonieta Pert, MD  [facility] 07/03/2020, 3:42 PM  If 7PM-7AM, please contact night-coverage [email or web address]

## 2020-07-02 NOTE — Consult Note (Addendum)
Cardiology Consultation:   Patient ID: Derek [name] MRN: [id number]; DOB: [birth year]  Admit date: 06/28/2020 Date of Consult: 07/02/2020  Primary Care Provider: Seward Carol, MD [facility] Cardiologist: Sinclair Grooms, MD   Patient Profile:   Derek [name] is a 86 y.o. male with a hx of CAD s/p BMS to CFx (2014), PAF on Eliquis, embolic CVA, OSA non complaint with CPAP, mild AS,CKDstage II-III, dementia and previous syncope who is being seen today for the evaluation of syncope and anticoagulation managment at the request of Dr. Lupita Leash.   High risk stress test in 2015. Follow up last cath 2015 showed widely patent proximal LAD and circumflex. Circumflex stent patent, high grade disease in the first diagonal, fifth OM and PDA accounting for the high risk abnormality on NST. Diffuse disease in nondominant RCA, preserved overall LV function. Medical therapy recommended.  There is a long standing history of fainting 1-2 times per month over the last several months.  It is not associated with nausea or sweating. Felt relatively related to low blood pressure. His antihypertensive has reduced when last seen by Dr. Tamala Julian 03/2020.  History of Present Illness:   Mr. Derek [name] admitted after recurrent episodes of syncope.  Patient has dementia and unable to answer question.  History obtained from reviewing chart and son at bedside.  Patient with prior history of orthostatic hypotension.  Son reported a frequent episodes of syncope/near syncope with 2 positional situation.  1st occurres after eating while sitting in a chair.  2nd  occurs after walking.  Son lives with patient and most of the time he notices stumping of present and catches him before fall.  There are also multiple incidents of syncope.  On October 20 patient had episode of syncope after dinner on table.  Another episode occurred on October 21 in bathroom while sitting on chair.  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  Multiple abrasion of skin due to fall.  Admitted to ER for further evaluation.  Patient was orthostatic in emergency room (blood pressure 164/90 with heart rate of 81 while sitting, blood pressure 108/67 with heart rate 130 on standing). He received fluids. CT of head/cervical spine without acuate findings. Eliquis and amlodipine held. CT of pelvis showed Nondisplaced fractures are seen involving the left superior and inferior pubic rami.  Currently on Imdur 30mg  qd and Toprol XL 25mg  qd. Most recent BP reading 166/71.  Echo this admission showed LVEF of 60-65%, grade 1 DD, moderate AS with mean gradient of 26.86mm Hg.    Past Medical History:  Diagnosis Date  . Broken shoulder   . Cerebrovascular disease    MRI/MRA in 2007 - 50% stenosis of supraclinoid ICA, left  . Clavicle fracture   . Closed wedge compression fracture of T1 vertebra ([place]) 02/20/2018  . Complex sleep apnea syndrome    adapt SV titration EEP 6 min 6 and max 15 cm water.   . Compression fracture   . Coronary atherosclerosis of native coronary artery 07/12/2013   a. NSTEMI in setting of AF with RVR 11/14 >> BMS to CFX;  b. Nuclear (9/15):  High risk - apical lateral ischemia >> c. LHC (9/15):  pLAD 50%, D1 (smaller br 90%/larger br 80%, pDx 50-60%, dLAD 50%, prox-mid CFX stent ok, pOM1 and OM2 50-70% (jailed by stent), OM5 70-80%, mPDA 90% (CFX dsz unchanged from 2014), RCA diff dsz, EF 55%, mild ant-apical HK >>  Med Rx  . CVA (cerebral vascular  accident) ([place])   . Dementia ([place])   . History of pneumonia   . Hx of echocardiogram    a. Echo 11/14): EF 55-60%, no RWMA, grade 2 DD, mild aortic stenosis (mean 15 mmHg), mild LAE  . Hypertension   . Lung nodule < 6cm on CT 02/20/2018  . MI (myocardial infarction) ([place]) 07/2013  . PAF (paroxysmal atrial fibrillation) ([facility])    hx of NSTEMI in setting of AF with RVR 07/2013;  Eliquis for AC  . Shingles    left chest  . Syncope   . TIA (transient ischemic  attack) 07/12/2013    Past Surgical History:  Procedure Laterality Date  . CATARACT EXTRACTION Bilateral   . KYPHOPLASTY    . LEFT HEART CATHETERIZATION WITH CORONARY ANGIOGRAM N/A 08/08/2013   Procedure: LEFT HEART CATHETERIZATION WITH CORONARY ANGIOGRAM;  Surgeon: Jettie Booze, MD;  Location: [facility] CATH LAB;  Service: Cardiovascular;  Laterality: N/A;  . LEFT HEART CATHETERIZATION WITH CORONARY ANGIOGRAM N/A 05/29/2014   Procedure: LEFT HEART CATHETERIZATION WITH CORONARY ANGIOGRAM;  Surgeon: Sinclair Grooms, MD;  Location: [facility] CATH LAB;  Service: Cardiovascular;  Laterality: N/A;  . PERCUTANEOUS STENT INTERVENTION  08/08/2013   Procedure: PERCUTANEOUS STENT INTERVENTION;  Surgeon: Jettie Booze, MD;  Location: [facility] CATH LAB;  Service: Cardiovascular;;  . SHOULDER SURGERY Left   . TONSILLECTOMY      Inpatient Medications: Scheduled Meds: . atorvastatin  40 mg Oral QPM  . donepezil  10 mg Oral QHS  . feeding supplement  237 mL Oral TID BM  . isosorbide mononitrate  30 mg Oral Daily  . metoprolol succinate  25 mg Oral Daily  . pantoprazole  40 mg Oral QAC breakfast  . potassium chloride  20 mEq Oral Daily  . potassium chloride  40 mEq Oral Once   Continuous Infusions: . azithromycin 500 mg (07/01/20 2214)  . cefTRIAXone (ROCEPHIN)  IV 2 g (07/01/20 2333)   PRN Meds: acetaminophen **OR** acetaminophen, nitroGLYCERIN, ondansetron **OR** ondansetron (ZOFRAN) IV, polyethylene glycol  Allergies:    Allergies  Allergen Reactions  . Augmentin [Amoxicillin-Pot Clavulanate] Rash  . Keppra [Levetiracetam]     Trouble with walking, fatigued  . Penicillins Rash    Has patient had a PCN reaction causing immediate rash, facial/tongue/throat swelling, SOB or lightheadedness with hypotension: Yes Has patient had a PCN reaction causing severe rash involving mucus membranes or skin necrosis: Unk Has patient had a PCN reaction that required hospitalization: Yes Has patient had a PCN  reaction occurring within the last 10 years: No If all of the above answers are "NO", then may proceed with Cephalosporin use.   . Sulfa Antibiotics Rash    Social History:   Social History   Socioeconomic History  . Marital status: Widowed    Spouse name: Not on file  . Number of children: 2  . Years of education: Engineer, maintenance (IT)  . Highest education level: Not on file  Occupational History  . Occupation: retired  Tobacco Use  . Smoking status: Never Smoker  . Smokeless tobacco: Never Used  Substance and Sexual Activity  . Alcohol use: Yes    Alcohol/week: 1.0 standard drink    Types: 1 Glasses of wine per week    Comment: occasionally  . Drug use: No  . Sexual activity: Not on file  Other Topics Concern  . Not on file  Social History Narrative   Patient is widowed [name]), has 2 children   Patient is right handed  Education level is 4 year degree.   Caffeine consumption is 2-4 cups daily   Social Determinants of Health   Financial Resource Strain:   . Difficulty of Paying Living Expenses: Not on file  Food Insecurity:   . Worried About Charity fundraiser in the Last Year: Not on file  . Ran Out of Food in the Last Year: Not on file  Transportation Needs:   . Lack of Transportation (Medical): Not on file  . Lack of Transportation (Non-Medical): Not on file  Physical Activity:   . Days of Exercise per Week: Not on file  . Minutes of Exercise per Session: Not on file  Stress:   . Feeling of Stress : Not on file  Social Connections:   . Frequency of Communication with Friends and Family: Not on file  . Frequency of Social Gatherings with Friends and Family: Not on file  . Attends Religious Services: Not on file  . Active Member of Clubs or Organizations: Not on file  . Attends Archivist Meetings: Not on file  . Marital Status: Not on file  Intimate Partner Violence:   . Fear of Current or Ex-Partner: Not on file  . Emotionally Abused: Not on file  .  Physically Abused: Not on file  . Sexually Abused: Not on file    Family History:   Family History  Problem Relation Age of Onset  . Diabetes Mother   . Diabetes Father   . Cancer - Lung Brother   . Gait disorder Brother   . Cancer Daughter   . Stroke Sister   . Heart attack Neg Hx      ROS:  Please see the history of present illness.  All other ROS reviewed and negative.     Physical Exam/Data:   Vitals:   07/01/20 1250 07/01/20 2216 07/02/20 0500 07/02/20 0729  BP: 128/68 (!) 141/83 (!) 170/89 (!) 166/71  Pulse: 65 73 80 76  Resp: 20 20 20 17   Temp: 98.1 F (36.7 C) 98.9 F (37.2 C) 98.9 F (37.2 C) 98.6 F (37 C)  TempSrc: Oral Oral Oral Oral  SpO2: 97% 97% 96% 97%  Weight:   76.2 kg   Height:        Intake/Output Summary (Last 24 hours) at 07/02/2020 1432 Last data filed at 07/01/2020 1800 Gross per 24 hour  Intake 350 ml  Output 250 ml  Net 100 ml   Last 3 Weights 07/02/2020 07/01/2020 06/30/2020  Weight (lbs) 168 lb 160 lb 170 lb  Weight (kg) 76.204 kg 72.576 kg 77.111 kg     Body mass index is 23.76 kg/m.  General:  Ill appearing elderly demented male in no acute distress HEENT: normal Lymph: no adenopathy Neck: no JVD Endocrine:  No thryomegaly Vascular: No carotid bruits; FA pulses 2+ bilaterally without bruits  Cardiac:  normal S1, S2; RRR; 3/6 systolic murmur  Lungs:  clear to auscultation bilaterally, no wheezing, rhonchi or rales  Abd: soft, nontender, no hepatomegaly  Ext: no edema Musculoskeletal:  No deformities, BUE and BLE strength normal and equal Skin: warm and dry  Neuro:  CNs 2-12 intact, no focal abnormalities noted Psych:  Normal affect   EKG:  The EKG was personally reviewed and demonstrates:  Sinus rhythm at rate of 65 bpm Telemetry:  Telemetry was personally reviewed and demonstrates:  Sinus rhythm  Relevant CV Studies: Echo 06/29/2020 1. Left ventricular ejection fraction, by estimation, is 60 to 65%. The  left  ventricle has normal function. The left ventricle has no regional  wall motion abnormalities. Left ventricular diastolic parameters are  consistent with Grade I diastolic  dysfunction (impaired relaxation).  2. Right ventricular systolic function is normal. The right ventricular  size is normal.  3. The mitral valve is normal in structure. Trivial mitral valve  regurgitation. No evidence of mitral stenosis.  4. The aortic valve has an indeterminant number of cusps. There is severe  calcifcation of the aortic valve. There is severe thickening of the aortic  valve. Aortic valve regurgitation is not visualized. Moderate aortic valve  stenosis. Aortic valve area,  by VTI measures 1.33 cm. Aortic valve mean gradient measures 26.5 mmHg.  Aortic valve Vmax measures 3.22 m/s.  5. The inferior vena cava is normal in size with greater than 50%  respiratory variability, suggesting right atrial pressure of 3 mmHg.  Monitor 12/2017  Normal sinus rhythm  PACs accounting for up to 6% of cardiac activity.  Rare PVCs  No atrial fibrillation or ventricular tachycardia noted  No excessive bradycardia.   Normal sinus rhythm PACs Rare PVCs No atrial fib, excessive bradycardia, or significant ventricular ectopy.  HEART RATE EPISODES Minimum HR: 45 BPM at 12:22:21 PM Maximum HR: 111 BPM at 7:52:44 AM Average HR: 61 BPM   Laboratory Data:  High Sensitivity Troponin:   Recent Labs  Lab 06/28/20 1844 06/28/20 2118  TROPONINIHS 9 15     Chemistry Recent Labs  Lab 06/28/20 1844 06/29/20 0210 07/02/20 0937  NA 141 138 139  K 3.8 3.8 3.0*  CL 110 105 108  CO2 21* 24 24  GLUCOSE 110* 147* 123*  BUN 25* 24* 21  CREATININE 1.22 1.33* 1.17  CALCIUM 8.5* 9.2 8.4*  GFRNONAA 56* 50* 59*  ANIONGAP 10 9 7     Recent Labs  Lab 06/28/20 1844 06/29/20 0210  PROT 5.6* 6.2*  ALBUMIN 3.2* 3.5  AST 25 26  ALT 20 21  ALKPHOS 73 79  BILITOT 0.8 0.9   Hematology Recent Labs  Lab  06/28/20 1844 06/29/20 0210 07/02/20 0937  WBC 14.0* 12.0* 10.5  RBC 4.86 4.52 3.61*  HGB 14.6 13.7 10.8*  HCT 44.2 40.8 32.4*  MCV 90.9 90.3 89.8  MCH 30.0 30.3 29.9  MCHC 33.0 33.6 33.3  RDW 13.6 13.5 13.4  PLT 215 211 181   BNP Recent Labs  Lab 06/28/20 1849  BNP 133.2*   Radiology/Studies:  DG Elbow 2 Views Left  Result Date: 06/28/2020 CLINICAL DATA:  Recent fall with skin tears, initial encounter EXAM: LEFT ELBOW - 2 VIEW COMPARISON:  None. FINDINGS: Considerable soft tissue swelling is noted along the proximal forearm consistent with the recent injury. Mild olecranon spurring is seen. No acute fracture is seen. There are findings suspicious for prior healed radial head fracture. No joint effusion is noted. IMPRESSION: No definitive fracture seen. Soft tissue swelling along the proximal forearm. Irregularity of the proximal radial head consistent with a healed prior fracture. Electronically Signed   By: Inez Catalina M.D.   On: 06/28/2020 21:37   DG Knee 2 Views Left  Result Date: 06/28/2020 CLINICAL DATA:  Recent falls with left knee pain, initial encounter EXAM: LEFT KNEE - 2 VIEW COMPARISON:  None. FINDINGS: No evidence of fracture, dislocation, or joint effusion. No evidence of arthropathy or other focal bone abnormality. Soft tissues are unremarkable. IMPRESSION: No acute abnormality noted. Electronically Signed   By: Inez Catalina M.D.   On: 06/28/2020 17:56   CT  Head Wo Contrast  Result Date: 06/28/2020 CLINICAL DATA:  Head trauma, minor. Additional history provided: Falls. EXAM: CT HEAD WITHOUT CONTRAST CT CERVICAL SPINE WITHOUT CONTRAST TECHNIQUE: Multidetector CT imaging of the head and cervical spine was performed following the standard protocol without intravenous contrast. Multiplanar CT image reconstructions of the cervical spine were also generated. COMPARISON:  Brain MRI 02/20/2018. Head CT 02/19/2018. CT cervical spine 03/14/2012. FINDINGS: CT HEAD FINDINGS  Brain: Moderate cerebral atrophy. Advanced ill-defined hypoattenuation within the cerebral white matter is nonspecific, but consistent with chronic small vessel ischemic disease. Known chronic lacunar infarcts within the deep gray nuclei, some of which were better appreciated on the prior MRI of 02/20/2018. There is no acute intracranial hemorrhage. No demarcated cortical infarct. No extra-axial fluid collection. No evidence of intracranial mass. No midline shift. Vascular: No hyperdense vessel.  Atherosclerotic calcifications. Skull: Normal. Negative for fracture or focal lesion. Sinuses/Orbits: Visualized orbits show no acute finding. Mild scattered paranasal sinus mucosal thickening. Small left maxillary sinus mucous retention cyst. Trace fluid within the left mastoid air cells. CT CERVICAL SPINE FINDINGS Alignment: Trace C2-C3 grade 1 retrolisthesis. Skull base and vertebrae: The basion-dental and atlanto-dental intervals are maintained.No evidence of acute fracture to the cervical spine. 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 Redemonstrated mild chronic T2 and T3 superior endplate deformities. Soft tissues and spinal canal: No prevertebral fluid or swelling. No visible canal hematoma. Disc levels: Cervical spondylosis. Most notably at C6-C7, there is advanced disc space narrowing with a prominent posterior disc osteophyte complex and left greater than right disc osteophyte ridge/uncinate hypertrophy. There is moderate spinal canal stenosis at this level with left-sided bony neural foraminal narrowing. Upper chest: No consolidation within the imaged lung apices. No visible pneumothorax. Other: Known chronic fracture deformity of the distal left clavicle, partially imaged. IMPRESSION: CT head: 1. No evidence of acute intracranial abnormality. 2. Moderate cerebral atrophy and advanced chronic small vessel ischemic disease, as described and stable as compared to the brain MRI of  02/20/2018. 3. Mild paranasal sinus mucosal thickening. CT cervical spine: 1. No evidence of acute fracture to the cervical spine. 2. Trace C2-C3 grade 1 retrolisthesis. 3. 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 4. Cervical spondylosis as described and greatest at C6-C7. 5. Redemonstrated mild chronic T2 and T3 superior endplate deformities. Electronically Signed   By: Kellie Simmering DO   On: 06/28/2020 18:27   CT Cervical Spine Wo Contrast  Result Date: 06/28/2020 CLINICAL DATA:  Head trauma, minor. Additional history provided: Falls. EXAM: CT HEAD WITHOUT CONTRAST CT CERVICAL SPINE WITHOUT CONTRAST TECHNIQUE: Multidetector CT imaging of the head and cervical spine was performed following the standard protocol without intravenous contrast. Multiplanar CT image reconstructions of the cervical spine were also generated. COMPARISON:  Brain MRI 02/20/2018. Head CT 02/19/2018. CT cervical spine 03/14/2012. FINDINGS: CT HEAD FINDINGS Brain: Moderate cerebral atrophy. Advanced ill-defined hypoattenuation within the cerebral white matter is nonspecific, but consistent with chronic small vessel ischemic disease. Known chronic lacunar infarcts within the deep gray nuclei, some of which were better appreciated on the prior MRI of 02/20/2018. There is no acute intracranial hemorrhage. No demarcated cortical infarct. No extra-axial fluid collection. No evidence of intracranial mass. No midline shift. Vascular: No hyperdense vessel.  Atherosclerotic calcifications. Skull: Normal. Negative for fracture or focal lesion. Sinuses/Orbits: Visualized orbits show no acute finding. Mild scattered paranasal sinus mucosal thickening. Small left maxillary sinus mucous retention cyst. Trace fluid within the left mastoid air cells. CT CERVICAL SPINE FINDINGS  Alignment: Trace C2-C3 grade 1 retrolisthesis. Skull base and vertebrae: The basion-dental and atlanto-dental intervals are maintained.No evidence of acute  fracture to the cervical spine. 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 Redemonstrated mild chronic T2 and T3 superior endplate deformities. Soft tissues and spinal canal: No prevertebral fluid or swelling. No visible canal hematoma. Disc levels: Cervical spondylosis. Most notably at C6-C7, there is advanced disc space narrowing with a prominent posterior disc osteophyte complex and left greater than right disc osteophyte ridge/uncinate hypertrophy. There is moderate spinal canal stenosis at this level with left-sided bony neural foraminal narrowing. Upper chest: No consolidation within the imaged lung apices. No visible pneumothorax. Other: Known chronic fracture deformity of the distal left clavicle, partially imaged. IMPRESSION: CT head: 1. No evidence of acute intracranial abnormality. 2. Moderate cerebral atrophy and advanced chronic small vessel ischemic disease, as described and stable as compared to the brain MRI of 02/20/2018. 3. Mild paranasal sinus mucosal thickening. CT cervical spine: 1. No evidence of acute fracture to the cervical spine. 2. Trace C2-C3 grade 1 retrolisthesis. 3. 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 4. Cervical spondylosis as described and greatest at C6-C7. 5. Redemonstrated mild chronic T2 and T3 superior endplate deformities. Electronically Signed   By: Kellie Simmering DO   On: 06/28/2020 18:27   CT PELVIS WO CONTRAST  Result Date: 06/30/2020 CLINICAL DATA:  Pelvic fracture after fall. EXAM: CT PELVIS WITHOUT CONTRAST TECHNIQUE: Multidetector CT imaging of the pelvis was performed following the standard protocol without intravenous contrast. COMPARISON:  June 28, 2020. FINDINGS: Urinary Tract: Large irregular bladder calculus is noted which measures 2.9 x 2.4 cm. Bowel:  Sigmoid diverticulosis is noted without inflammation. Vascular/Lymphatic: Aortic atherosclerosis.  No adenopathy is noted. Reproductive:  Moderate  prostatic enlargement is noted. Other:  Small fat containing left inguinal hernia is noted. Musculoskeletal: Nondisplaced fractures are seen involving the left superior and inferior pubic rami. Hip and sacroiliac joints are unremarkable. No other bony abnormality is noted. IMPRESSION: 1. Nondisplaced fractures are seen involving the left superior and inferior pubic rami. 2. Large irregular bladder calculus is noted which measures 2.9 x 2.4 cm. 3. Moderate prostatic enlargement is noted. 4. Sigmoid diverticulosis without inflammation. 5. Small fat containing left inguinal hernia. 6. Aortic atherosclerosis. Aortic Atherosclerosis (ICD10-I70.0). Electronically Signed   By: Marijo Conception M.D.   On: 06/30/2020 16:33   DG Pelvis Portable  Result Date: 06/28/2020 CLINICAL DATA:  Recent falls with left hip pain, initial encounter EXAM: PORTABLE PELVIS 1-2 VIEWS COMPARISON:  None. FINDINGS: Pelvic ring demonstrates mildly displaced fractures through the superior and inferior pubic rami on the left. Proximal left femur shows no acute fracture. Mild degenerative changes of the hip joints are noted. Stellate bladder calculus is noted. IMPRESSION: Fractures of the left superior and inferior pubic rami. Bladder calculus. Electronically Signed   By: Inez Catalina M.D.   On: 06/28/2020 17:56   DG CHEST PORT 1 VIEW  Result Date: 07/01/2020 CLINICAL DATA:  Acute respiratory failure with hypoxia EXAM: PORTABLE CHEST 1 VIEW COMPARISON:  06/28/2020 FINDINGS: Shallow inspiration. Atelectasis or infiltration is developing in the left lower lung. New prominence of perihilar structures may represent infiltrates or developing central vascular congestion. No pleural effusions. No pneumothorax. Calcification of the aorta. Postoperative change in the left shoulder. IMPRESSION: Developing infiltration or atelectasis in the left lower lung and perihilar regions. Electronically Signed   By: Lucienne Capers M.D.   On: 07/01/2020 00:39     DG  Chest Portable 1 View  Result Date: 06/28/2020 CLINICAL DATA:  Recent falls with chest pain, initial encounter EXAM: PORTABLE CHEST 1 VIEW COMPARISON:  05/04/2019 FINDINGS: Cardiac shadow is within normal limits but accentuated by the AP technique. Aortic calcifications are seen. Lungs are clear bilaterally. No focal infiltrate or effusion is seen. No acute rib fractures are noted. Old healed rib fractures on the left are seen. Prior left shoulder replacement is noted. IMPRESSION: No acute abnormality noted. Electronically Signed   By: Inez Catalina M.D.   On: 06/28/2020 17:52   ECHOCARDIOGRAM COMPLETE  Result Date: 06/29/2020    ECHOCARDIOGRAM REPORT   Patient Name:   WYNSTON ROMEY Date of Exam: 06/29/2020 Medical Rec #:  [id number]        Height:       70.5 in Accession #:    [id number]       Weight:       160.0 lb Date of Birth:  1927/02/14        BSA:          1.909 m Patient Age:    43 years         BP:           156/98 mmHg Patient Gender: M                HR:           81 bpm. Exam Location:  Inpatient Procedure: 2D Echo, Cardiac Doppler and Color Doppler Indications:    Aortic Stenosis 424.1 / 135.0  History:        Patient has prior history of Echocardiogram examinations, most                 recent 12/25/2017. CAD and Previous Myocardial Infarction, Stroke                 and TIA; Risk Factors:Hypertension and Non-Smoker.  Sonographer:    Vickie Epley RDCS Referring Phys: [id number] [place]  1. Left ventricular ejection fraction, by estimation, is 60 to 65%. The left ventricle has normal function. The left ventricle has no regional wall motion abnormalities. Left ventricular diastolic parameters are consistent with Grade I diastolic dysfunction (impaired relaxation).  2. Right ventricular systolic function is normal. The right ventricular size is normal.  3. The mitral valve is normal in structure. Trivial mitral valve regurgitation. No evidence of mitral stenosis.  4. The  aortic valve has an indeterminant number of cusps. There is severe calcifcation of the aortic valve. There is severe thickening of the aortic valve. Aortic valve regurgitation is not visualized. Moderate aortic valve stenosis. Aortic valve area, by VTI measures 1.33 cm. Aortic valve mean gradient measures 26.5 mmHg. Aortic valve Vmax measures 3.22 m/s.  5. The inferior vena cava is normal in size with greater than 50% respiratory variability, suggesting right atrial pressure of 3 mmHg. FINDINGS  Left Ventricle: Left ventricular ejection fraction, by estimation, is 60 to 65%. The left ventricle has normal function. The left ventricle has no regional wall motion abnormalities. The left ventricular internal cavity size was normal in size. There is  no left ventricular hypertrophy. Left ventricular diastolic parameters are consistent with Grade I diastolic dysfunction (impaired relaxation). Normal left ventricular filling pressure. Right Ventricle: The right ventricular size is normal. No increase in right ventricular wall thickness. Right ventricular systolic function is normal. Left Atrium: Left atrial size was normal in size. Right Atrium: Right atrial size was normal in  size. Pericardium: There is no evidence of pericardial effusion. Mitral Valve: The mitral valve is normal in structure. Mild to moderate mitral annular calcification. Trivial mitral valve regurgitation. No evidence of mitral valve stenosis. Tricuspid Valve: The tricuspid valve is normal in structure. Tricuspid valve regurgitation is trivial. No evidence of tricuspid stenosis. Aortic Valve: The aortic valve has an indeterminant number of cusps. There is severe calcifcation of the aortic valve. There is severe thickening of the aortic valve. Aortic valve regurgitation is not visualized. Moderate aortic stenosis is present. Aortic valve mean gradient measures 26.5 mmHg. Aortic valve peak gradient measures 41.6 mmHg. Aortic valve area, by VTI measures  1.33 cm. Pulmonic Valve: The pulmonic valve was normal in structure. Pulmonic valve regurgitation is not visualized. No evidence of pulmonic stenosis. Aorta: The aortic root is normal in size and structure. Venous: The inferior vena cava is normal in size with greater than 50% respiratory variability, suggesting right atrial pressure of 3 mmHg. IAS/Shunts: No atrial level shunt detected by color flow Doppler.  LEFT VENTRICLE PLAX 2D LVIDd:         4.50 cm     Diastology LVIDs:         3.40 cm     LV e' medial:    5.52 cm/s LV PW:         0.80 cm     LV E/e' medial:  14.7 LV IVS:        0.80 cm     LV e' lateral:   7.97 cm/s LVOT diam:     2.10 cm     LV E/e' lateral: 10.2 LV SV:         106 LV SV Index:   56 LVOT Area:     3.46 cm  LV Volumes (MOD) LV vol d, MOD A2C: 69.3 ml LV vol d, MOD A4C: 71.3 ml LV vol s, MOD A2C: 27.4 ml LV vol s, MOD A4C: 38.5 ml LV SV MOD A2C:     41.9 ml LV SV MOD A4C:     71.3 ml LV SV MOD BP:      37.6 ml RIGHT VENTRICLE RV S prime:     15.10 cm/s TAPSE (M-mode): 2.6 cm LEFT ATRIUM             Index       RIGHT ATRIUM           Index LA diam:        4.40 cm 2.31 cm/m  RA Area:     13.30 cm LA Vol (A2C):   49.0 ml 25.67 ml/m RA Volume:   31.90 ml  16.71 ml/m LA Vol (A4C):   27.4 ml 14.36 ml/m LA Biplane Vol: 40.6 ml 21.27 ml/m  AORTIC VALVE AV Area (Vmax):    1.22 cm AV Area (Vmean):   1.00 cm AV Area (VTI):     1.33 cm AV Vmax:           322.50 cm/s AV Vmean:          246.500 cm/s AV VTI:            0.794 m AV Peak Grad:      41.6 mmHg AV Mean Grad:      26.5 mmHg LVOT Vmax:         114.00 cm/s LVOT Vmean:        71.500 cm/s LVOT VTI:          0.306 m LVOT/AV VTI ratio:  0.13  AORTA Ao Root diam: 3.20 cm MITRAL VALVE MV Area (PHT): 2.66 cm     SHUNTS MV Decel Time: 285 msec     Systemic VTI:  0.31 m MV E velocity: 81.40 cm/s   Systemic Diam: 2.10 cm MV A velocity: 124.00 cm/s MV E/A ratio:  0.66 Fransico Him MD Electronically signed by Fransico Him MD Signature Date/Time:  06/29/2020/10:42:50 AM    Final    DG Femur 1V Left  Result Date: 06/28/2020 CLINICAL DATA:  Recent falls with left hip pain, initial encounter EXAM: LEFT FEMUR 1 VIEW COMPARISON:  None. FINDINGS: No acute fracture or dislocation is noted. No soft tissue abnormality is seen. IMPRESSION: No acute abnormality noted. Electronically Signed   By: Inez Catalina M.D.   On: 06/28/2020 17:53    Assessment and Plan:   1. Recurrent syncope - Sounds like orthostatic and vagal mediated. Significant orthostasis during admit in ER. Amlodipine has been discontinued.  - Echo with preserved LVEF. He has moderate AS but no significant to cause him syncope. However, he is not candidate for TVAR/AVR. Keep hydrated.  -Currently on Imdur 30 qd and Toprol XL 25mg  qd>> will stop to avoid autonomic dysfunction.  - Consider abdominal binder and/or stockings.   2. PAF - Maintaining sinus rhythm. His eliquis has been hold since admit. Discussed risk and benefits of anticoagulation with son who clearly wants his father to be on anticoagulation to avoid stroke risk and understand risk of bleeding in setting of recurrent fall.  Restart Eliquis 2.5mg  BID.   CHA2DS2-VASc Score = 6  This indicates a 9.7% annual risk of stroke. The patient's score is based upon: CHF History: 0 HTN History: 1 Diabetes History: 0 Stroke History: 2 Vascular Disease History: 1 Age Score: 2 Gender Score: 0   3. Moderate AS - Aortic valve area, by VTI measures 1.33 cm. Aortic valve mean gradient measures 26.5 mmHg. Aortic valve Vmax measures 3.22 m/s.  - Follow clinically. He is not a surgical candidate.   4. CAD - Last cath in 2015 as above. Recommended medical therapy - No chest pain  - Not on ASA due to need of anticoagulation - Continue statin  5. Hypokalemia - Supplement given   Patient has been seen by PT and recommended SNF.  Consider palliative care.   For questions or updates, please contact [place] Please  consult [email or web address] for contact info under    Jarrett Soho, PA  07/02/2020 2:32 PM   Personally seen and examined. Agree with above.   Had lengthy conversation with his son Davionne Dowty. plan will be to pull back on all of his antihypertensives given his dramatic decrease in blood pressure indicative of severe orthostatic hypotension.  Willing to tolerate elevated blood pressures such as 324 systolic given these wild swings.  His autonomic nervous system is not as responsive as it used to be.  Progressive dementia with good days and bad days also exacerbating the issue.  His son has seen his mother died with ALS ultimately on hospice.  He has realistic expectations for his father and would like for him to try to utilize walker in rehab to regain some of his strength.  I am worried that he may end up having more episodes of loss of consciousness etc.  His son is very diligent with him and takes great care of him and catches him when he is about to fall.  We discussed the risks and benefits of Eliquis.  My  recommendation originally was to consider discontinuation given his recurrent falls and recurrent near syncopal episodes and progressive dementia.  At this time, his son would like to continue the Eliquis out of fear of potential stroke.  We discussed his CHADSVASc risk.  For now, we will go ahead and stop his antihypertensives.  We will stop Imdur 30, Toprol 25.  Challenging situation.  Candee Furbish, MD

## 2020-07-03 ENCOUNTER — Ambulatory Visit: Payer: Medicare Other | Admitting: Neurology

## 2020-07-03 ENCOUNTER — Inpatient Hospital Stay: Payer: Medicare Other

## 2020-07-03 DIAGNOSIS — R55 Syncope and collapse: Secondary | ICD-10-CM | POA: Diagnosis not present

## 2020-07-03 DIAGNOSIS — Z23 Encounter for immunization: Secondary | ICD-10-CM

## 2020-07-03 DIAGNOSIS — E440 Moderate protein-calorie malnutrition: Secondary | ICD-10-CM | POA: Insufficient documentation

## 2020-07-03 DIAGNOSIS — I480 Paroxysmal atrial fibrillation: Secondary | ICD-10-CM | POA: Diagnosis not present

## 2020-07-03 LAB — BASIC METABOLIC PANEL
Anion gap: 11 (ref 5–15)
BUN: 18 mg/dL (ref 8–23)
CO2: 24 mmol/L (ref 22–32)
Calcium: 8.7 mg/dL — ABNORMAL LOW (ref 8.9–10.3)
Chloride: 103 mmol/L (ref 98–111)
Creatinine, Ser: 0.98 mg/dL (ref 0.61–1.24)
GFR, Estimated: >60 mL/min (ref >60)
Glucose, Bld: 109 mg/dL — ABNORMAL HIGH (ref 70–99)
Potassium: 3.4 mmol/L — ABNORMAL LOW (ref 3.5–5.1)
Sodium: 138 mmol/L (ref 135–145)

## 2020-07-03 MED ORDER — CEFDINIR 300 MG PO CAPS: 300.0000 mg | ORAL_CAPSULE | Freq: Two times a day (BID) | ORAL | 0 refills | Status: AC

## 2020-07-03 MED ORDER — METOPROLOL TARTRATE 25 MG PO TABS: 25.0000 mg | ORAL_TABLET | Freq: Two times a day (BID) | ORAL | Status: DC

## 2020-07-03 MED ORDER — METOPROLOL TARTRATE 25 MG PO TABS
25.0000 mg | ORAL_TABLET | Freq: Two times a day (BID) | ORAL | Status: DC
  Administered 2020-07-03 – 2020-07-04 (×3): 25 mg via ORAL
  Filled 2020-07-03 (×3): qty 1

## 2020-07-03 NOTE — Progress Notes (Signed)
Physical Therapy Treatment Patient Details Name: Derek [name] MRN: [id number] DOB: 1927-08-30 Today's Date: 07/03/2020    History of Present Illness Pt is a 86 y/o male admitted secondary to syncopal episode with fall. Found to have L pubic rami fx to be treated conservatively. PMH includes CAD, a fib, CKD, and dementia.      PT Comments    Pt making good progress. Continue to recommend ST-SNF. Orthostatic vitals as follows:  Orthostatic BPs  Supine 167/91 85 HR  Sitting 155/77 102 HR  Standing 132/76 113 HR  Standing after 3 min 137/71 120 HR      Follow Up Recommendations  SNF;Supervision/Assistance - 24 hour      Equipment Recommendations  Other (comment) (TBD at next venue)     Recommendations for Other Services        Precautions / Restrictions Precautions Precautions: Fall Restrictions Other Position/Activity Restrictions: WBAT; conservative management.      Mobility  Bed Mobility Overal bed mobility: Needs Assistance Bed Mobility: Supine to Sit     Supine to sit: Mod assist     General bed mobility comments: Assist to bring legs off of bed, elevate trunk into sitting and bring hips to EOB  Transfers Overall transfer level: Needs assistance Equipment used: Ambulation equipment used;Rolling walker (2 wheeled) Transfers: Sit to/from Stand Sit to Stand: Mod assist;+2 physical assistance         General transfer comment: Assist to bring hips up and for balance. Stood from bed with Stedy and used stedy for bed to chair. From chair stood with walker.   Ambulation/Gait Ambulation/Gait assistance: Mod assist;+2 physical assistance Gait Distance (Feet): 2 Feet Assistive device: Rolling walker (2 wheeled) Gait Pattern/deviations: Step-to pattern;Decreased step length - right;Decreased step length - left;Shuffle;Decreased stance time - left;Antalgic Gait velocity: decr Gait velocity interpretation: <1.31 ft/sec, indicative of household  ambulator General Gait Details: Assist with balance and support.    Stairs             Wheelchair Mobility    Modified Rankin (Stroke Patients Only)       Balance Overall balance assessment: Needs assistance Sitting-balance support: Bilateral upper extremity supported;Feet supported Sitting balance-Leahy Scale: Poor Sitting balance - Comments: UE support   Standing balance support: Bilateral upper extremity supported Standing balance-Leahy Scale: Poor Standing balance comment: Stood with stedy with min assist for 3 minutes. Stood with walker with min assist for static                            Cognition Arousal/Alertness: Awake/alert Behavior During Therapy: WFL for tasks assessed/performed Overall Cognitive Status: No family/caregiver present to determine baseline cognitive functioning                                 General Comments: Hx of dementia at baseline. Only oriented to self.       Exercises      General Comments General comments (skin integrity, edema, etc.): See assessment for orthostatics. HR to 140 briefly with standing and sustained at 120 with standing and gait      Pertinent Vitals/Pain Pain Assessment: Faces Faces Pain Scale: Hurts little more Pain Location: Lt groin with weight bearing Pain Descriptors / Indicators: Grimacing;Guarding Pain Intervention(s): Monitored during session    Home Living  Prior Function            PT Goals (current goals can now be found in the care plan section) Acute Rehab PT Goals PT Goal Formulation: Patient unable to participate in goal setting Time For Goal Achievement: 07/13/20 Potential to Achieve Goals: Good Progress towards PT goals: Goals met and updated - see care plan;Progressing toward goals    Frequency    Min 2X/week      PT Plan Current plan remains appropriate    Co-evaluation              AM-PAC PT "6 Clicks"  Mobility   Outcome Measure  Help needed turning from your back to your side while in a flat bed without using bedrails?: Total Help needed moving from lying on your back to sitting on the side of a flat bed without using bedrails?: A Lot Help needed moving to and from a bed to a chair (including a wheelchair)?: A Lot Help needed standing up from a chair using your arms (e.g., wheelchair or bedside chair)?: A Lot Help needed to walk in hospital room?: A Lot Help needed climbing 3-5 steps with a railing? : Total 6 Click Score: 10    End of Session Equipment Utilized During Treatment: Gait belt Activity Tolerance: Patient tolerated treatment well Patient left: with call bell/phone within reach;in chair;with chair alarm set Nurse Communication: Mobility status PT Visit Diagnosis: Unsteadiness on feet (R26.81);Muscle weakness (generalized) (M62.81);Pain Pain - Right/Left: Left Pain - part of body:  (pelvis)     Time: 6116-4353 PT Time Calculation (min) (ACUTE ONLY): 35 min  Charges:  $Therapeutic Activity: 23-37 mins                     [place] Pager [phone] Office [place] 07/03/2020, 10:14 AM

## 2020-07-03 NOTE — Progress Notes (Addendum)
Progress Note  Patient Name: Derek [name] Date of Encounter: 07/03/2020  [facility] Cardiologist: Sinclair Grooms, MD   Subjective   C/o dizziness, c/o hunger, no chest pain  Inpatient Medications    Scheduled Meds: . apixaban  2.5 mg Oral BID  . atorvastatin  40 mg Oral QPM  . donepezil  10 mg Oral QHS  . feeding supplement  237 mL Oral BID BM  . multivitamin with minerals  1 tablet Oral Daily  . pantoprazole  40 mg Oral QAC breakfast  . potassium chloride  20 mEq Oral Daily   Continuous Infusions: . azithromycin 500 mg (07/02/20 2344)  . cefTRIAXone (ROCEPHIN)  IV 2 g (07/02/20 2243)   PRN Meds: acetaminophen **OR** acetaminophen, nitroGLYCERIN, ondansetron **OR** ondansetron (ZOFRAN) IV, polyethylene glycol   Vital Signs    Vitals:   07/02/20 0729 07/02/20 1525 07/02/20 2211 07/03/20 0559  BP: (!) 166/71 119/69 (!) 160/80 (!) 167/89  Pulse: 76 65 73 73  Resp: 17 20 20 16   Temp: 98.6 F (37 C) 98 F (36.7 C) 98 F (36.7 C) 97.9 F (36.6 C)  TempSrc: Oral Oral Oral Oral  SpO2: 97% 100% 95% 95%  Weight:    80.3 kg  Height:        Intake/Output Summary (Last 24 hours) at 07/03/2020 0748 Last data filed at 07/03/2020 0603 Gross per 24 hour  Intake 550 ml  Output 2675 ml  Net -2125 ml   Last 3 Weights 07/03/2020 07/02/2020 07/01/2020  Weight (lbs) 177 lb 168 lb 160 lb  Weight (kg) 80.287 kg 76.204 kg 72.576 kg      Telemetry    SR, short runs atrial tach vs afib - Personally Reviewed  ECG    None today - Personally Reviewed  Physical Exam   GEN: No acute distress. Mouth appears dry  Neck: No JVD Cardiac: RRR, 2-3/6 murmur, no rubs, or gallops.  Respiratory: Clear to auscultation bilaterally. GI: Soft, nontender, non-distended  MS: No edema; No deformity.  Neuro:  Nonfocal  Psych: Normal affect   Labs    High Sensitivity Troponin:   Recent Labs  Lab 06/28/20 1844 06/28/20 2118  TROPONINIHS 9 15      Chemistry Recent  Labs  Lab 06/28/20 1844 06/29/20 0210 07/02/20 0937  NA 141 138 139  K 3.8 3.8 3.0*  CL 110 105 108  CO2 21* 24 24  GLUCOSE 110* 147* 123*  BUN 25* 24* 21  CREATININE 1.22 1.33* 1.17  CALCIUM 8.5* 9.2 8.4*  PROT 5.6* 6.2*  --   ALBUMIN 3.2* 3.5  --   AST 25 26  --   ALT 20 21  --   ALKPHOS 73 79  --   BILITOT 0.8 0.9  --   GFRNONAA 56* 50* 59*  ANIONGAP 10 9 7      Hematology Recent Labs  Lab 06/28/20 1844 06/29/20 0210 07/02/20 0937  WBC 14.0* 12.0* 10.5  RBC 4.86 4.52 3.61*  HGB 14.6 13.7 10.8*  HCT 44.2 40.8 32.4*  MCV 90.9 90.3 89.8  MCH 30.0 30.3 29.9  MCHC 33.0 33.6 33.3  RDW 13.6 13.5 13.4  PLT 215 211 181    BNP Recent Labs  Lab 06/28/20 1849  BNP 133.2*    Lab Results  Component Value Date   TSH 1.100 01/31/2020   Lab Results  Component Value Date   HGBA1C 6.3 (H) 12/11/2014    DDimer No results for input(s): DDIMER in the  last 168 hours.   Radiology    No results found.  Cardiac Studies   ECHO: 06/29/2020 1. Left ventricular ejection fraction, by estimation, is 60 to 65%. The  left ventricle has normal function. The left ventricle has no regional  wall motion abnormalities. Left ventricular diastolic parameters are  consistent with Grade I diastolic  dysfunction (impaired relaxation).  2. Right ventricular systolic function is normal. The right ventricular  size is normal.  3. The mitral valve is normal in structure. Trivial mitral valve  regurgitation. No evidence of mitral stenosis.  4. The aortic valve has an indeterminant number of cusps. There is severe  calcifcation of the aortic valve. There is severe thickening of the aortic  valve. Aortic valve regurgitation is not visualized. Moderate aortic valve  stenosis. Aortic valve area,  by VTI measures 1.33 cm. Aortic valve mean gradient measures 26.5 mmHg.  Aortic valve Vmax measures 3.22 m/s.  5. The inferior vena cava is normal in size with greater than 50%    respiratory variability, suggesting right atrial pressure of 3 mmHg.   Patient Profile     86 y.o. male with hx CAD s/p BMS to CFx (2014), PAF on Eliquis, embolic CVA, OSA non complaint with CPAP, mild AS,CKDstage II-III, dementia and previous syncope, was admitted 10/23 with syncope.   Assessment & Plan    1. Syncope - Hx of orthostatic hypotension, orthostatic VS were ++ on admit - Imdur, amlodipine and Toprol XL held - recheck orthostatic VS now - suspect he does not drink enough, need to give his son goals for intake, suggest 1.5-2.0 L qd - review orthostatic VS results and decide on TED hose, abd binder - also needs to elevate the head of the bed at least 6 inches - he should never lie flat  2. HTN - rx d/c'd - [id number] last 24 hr - off all rx  3. Arrhrythmia - short runs a tach vs a fib - not causing sx - hx PAF, on Eliquis low-dose as son strongly wishes to prevent a stroke   Otherwise, per IM Principal Problem:   Syncope Active Problems:   Dementia ([place])   Coronary artery disease involving native coronary artery of native heart without angina pectoris   AF (paroxysmal atrial fibrillation) (HCC)   Moderate aortic stenosis   Chronic kidney disease, stage 3a (HCC)   Hypomagnesemia   Closed fracture of multiple pubic rami (HCC)   Leukocytosis   Hematoma of arm, left, initial encounter   GERD without esophagitis        For questions or updates, please contact [facility] Please consult [email or web address] for contact info under        Signed, Rosaria Ferries, PA-C  07/03/2020, 7:48 AM    Personally seen and examined. Agree with above.   Telemetry reviewed -- occasional paroxysmal atrial tachycardia, non sustained, asymptomatic. No pauses. Would continue our plan for avoidance of BP meds at this point.   Stopped all BP meds yesterday including metoprolol.  Continued Eliquis at request of son.   Severe orthostatic symptoms with autonomic vagal type  reactions. Underlying progressive dementia, falls  SNF/rehab to hopefully encourage walker use, mobility. Lengthy conversation with son yesterday, see prior note.  Will have follow up as outpatient in 6 weeks (Has appt with Dr. Tamala Julian scheduled).  No monitor needed as outpatient  Will sign off.   Candee Furbish, MD

## 2020-07-03 NOTE — Progress Notes (Signed)
Verbal consent given by pt's brother and son, and by Dr. Samuel Bouche.  Murvin Natal, RN   Covid-19 Vaccination Clinic  Name:  Derek [name]    MRN: [id number] DOB: 01-Feb-1927  07/03/2020  Derek [name] was observed post Covid-19 immunization for 15 minutes without incident. He was provided with Vaccine Information Sheet and instruction to access the V-Safe system.   Derek [name] was instructed to call 911 with any severe reactions post vaccine: Marland Kitchen Difficulty breathing  . Swelling of face and throat  . A fast heartbeat  . A bad rash all over body  . Dizziness and weakness   Immunizations Administered    Name Date Dose VIS Date Route   Pfizer COVID-19 Vaccine 07/03/2020 11:00 AM 0.3 mL 11/02/2018 Intramuscular   Manufacturer: [place]   Lot: [id number]   Tat Momoli: [address]-9

## 2020-07-03 NOTE — TOC Progression Note (Signed)
Transition of Care [name]) - Progression Note    Patient Details  Name: Derek [name] MRN: [id number] Date of Birth: March 21, 1927  Transition of Care [name]) CM/SW [name], [name] Phone Number: 07/03/2020, 10:14 AM  Clinical Narrative:     CSW spoke with [name] at [name] and she confirmed that she can accept patient for SNF placement. CSW called patients daughter [name] to let her know that [name] will have a bed available for patient today.  Patient has SNF bed at [name].   CSW will continue to follow.   Expected Discharge Plan: [name] Barriers to Discharge: No Barriers Identified  Expected Discharge Plan and Services Expected Discharge Plan: [name] arrangements for the past 2 months: Single Family Home Expected Discharge Date: 07/03/20                                     Social Determinants of Health (SDOH) Interventions    Readmission Risk Interventions No flowsheet data found.

## 2020-07-03 NOTE — Progress Notes (Signed)
Patient resting in lounge chair with no s/s intolerance. Afib with rate of 120-140's started around 1050 this am and increased rate. Given metoprolol tartrate 25 mg at 1153 and monitored cardiac rate and rhythm. Remains in afib but rate has decreased to 100-110 since 1500. Notified hospitalist and cardiology through secure chat.  Orders received for dc, notified SW Farris Has and son Gershon Mussel.

## 2020-07-03 NOTE — TOC Transition Note (Signed)
Transition of Care [facility]) - CM/SW Discharge Note   Patient Details  Name: RAYNOLD BLANKENBAKER MRN: [id number] Date of Birth: Mar 25, 1927  Transition of Care [facility]) CM/SW Contact:  Trula Ore, [place] Phone Number: 07/03/2020, 11:16 AM   Clinical Narrative:     Patient will DC to: [name]  Anticipated DC date: 07/03/2020  Family notified: [name]  Transport by: Corey Harold  ?  Per MD patient ready for DC to [name]. RN, patient, patient's family, and facility notified of DC. Discharge Summary sent to facility. RN given number for report tele#[phone] RM#[phone]. DC packet on chart. DNR signed by MD,attached to DC packet. Ambulance transport requested for patient.  CSW signing off.  Final next level of care: Skilled Nursing Facility Barriers to Discharge: No Barriers Identified   Patient Goals and CMS Choice   CMS Medicare.gov Compare Post Acute Care list provided to:: Patient Represenative (must comment) Marcello Moores and [name]) Choice offered to / list presented to : Adult Children Marcello Moores and [name])  Discharge Placement              Patient chooses bed at: [facility] Patient to be transferred to facility by: [place] Name of family member notified: [name] Patient and family notified of of transfer: 07/03/20  Discharge Plan and Services                                     Social Determinants of Health (SDOH) Interventions     Readmission Risk Interventions No flowsheet data found.

## 2020-07-04 DIAGNOSIS — Z23 Encounter for immunization: Secondary | ICD-10-CM | POA: Diagnosis not present

## 2020-07-04 DIAGNOSIS — W07XXXA Fall from chair, initial encounter: Secondary | ICD-10-CM | POA: Diagnosis not present

## 2020-07-04 DIAGNOSIS — I350 Nonrheumatic aortic (valve) stenosis: Secondary | ICD-10-CM | POA: Diagnosis not present

## 2020-07-04 DIAGNOSIS — J181 Lobar pneumonia, unspecified organism: Secondary | ICD-10-CM | POA: Diagnosis not present

## 2020-07-04 DIAGNOSIS — Y93E1 Activity, personal bathing and showering: Secondary | ICD-10-CM | POA: Diagnosis not present

## 2020-07-04 DIAGNOSIS — I10 Essential (primary) hypertension: Secondary | ICD-10-CM | POA: Diagnosis not present

## 2020-07-04 DIAGNOSIS — N183 Chronic kidney disease, stage 3 unspecified: Secondary | ICD-10-CM | POA: Diagnosis not present

## 2020-07-04 DIAGNOSIS — F0390 Unspecified dementia without behavioral disturbance: Secondary | ICD-10-CM | POA: Diagnosis not present

## 2020-07-04 DIAGNOSIS — R531 Weakness: Secondary | ICD-10-CM | POA: Diagnosis not present

## 2020-07-04 DIAGNOSIS — Z7401 Bed confinement status: Secondary | ICD-10-CM | POA: Diagnosis not present

## 2020-07-04 DIAGNOSIS — E785 Hyperlipidemia, unspecified: Secondary | ICD-10-CM | POA: Diagnosis not present

## 2020-07-04 DIAGNOSIS — M2550 Pain in unspecified joint: Secondary | ICD-10-CM | POA: Diagnosis not present

## 2020-07-04 DIAGNOSIS — Z20828 Contact with and (suspected) exposure to other viral communicable diseases: Secondary | ICD-10-CM | POA: Diagnosis not present

## 2020-07-04 DIAGNOSIS — K219 Gastro-esophageal reflux disease without esophagitis: Secondary | ICD-10-CM | POA: Diagnosis not present

## 2020-07-04 DIAGNOSIS — I2510 Atherosclerotic heart disease of native coronary artery without angina pectoris: Secondary | ICD-10-CM | POA: Diagnosis not present

## 2020-07-04 DIAGNOSIS — I480 Paroxysmal atrial fibrillation: Secondary | ICD-10-CM | POA: Diagnosis not present

## 2020-07-04 DIAGNOSIS — E440 Moderate protein-calorie malnutrition: Secondary | ICD-10-CM | POA: Diagnosis not present

## 2020-07-04 DIAGNOSIS — R55 Syncope and collapse: Secondary | ICD-10-CM | POA: Diagnosis not present

## 2020-07-04 DIAGNOSIS — I499 Cardiac arrhythmia, unspecified: Secondary | ICD-10-CM | POA: Diagnosis not present

## 2020-07-04 DIAGNOSIS — S32599D Other specified fracture of unspecified pubis, subsequent encounter for fracture with routine healing: Secondary | ICD-10-CM | POA: Diagnosis not present

## 2020-07-04 DIAGNOSIS — R296 Repeated falls: Secondary | ICD-10-CM | POA: Diagnosis not present

## 2020-07-04 DIAGNOSIS — I129 Hypertensive chronic kidney disease with stage 1 through stage 4 chronic kidney disease, or unspecified chronic kidney disease: Secondary | ICD-10-CM | POA: Diagnosis not present

## 2020-07-04 DIAGNOSIS — G4733 Obstructive sleep apnea (adult) (pediatric): Secondary | ICD-10-CM | POA: Diagnosis not present

## 2020-07-04 DIAGNOSIS — F0280 Dementia in other diseases classified elsewhere without behavioral disturbance: Secondary | ICD-10-CM | POA: Diagnosis not present

## 2020-07-04 DIAGNOSIS — Y92091 Bathroom in other non-institutional residence as the place of occurrence of the external cause: Secondary | ICD-10-CM | POA: Diagnosis not present

## 2020-07-04 DIAGNOSIS — T148XXA Other injury of unspecified body region, initial encounter: Secondary | ICD-10-CM | POA: Diagnosis not present

## 2020-07-04 DIAGNOSIS — I4891 Unspecified atrial fibrillation: Secondary | ICD-10-CM | POA: Diagnosis not present

## 2020-07-04 DIAGNOSIS — N1831 Chronic kidney disease, stage 3a: Secondary | ICD-10-CM | POA: Diagnosis not present

## 2020-07-04 LAB — BASIC METABOLIC PANEL
Anion gap: 9 (ref 5–15)
BUN: 22 mg/dL (ref 8–23)
CO2: 25 mmol/L (ref 22–32)
Calcium: 8.6 mg/dL — ABNORMAL LOW (ref 8.9–10.3)
Chloride: 104 mmol/L (ref 98–111)
Creatinine, Ser: 1.03 mg/dL (ref 0.61–1.24)
GFR, Estimated: >60 mL/min (ref >60)
Glucose, Bld: 135 mg/dL — ABNORMAL HIGH (ref 70–99)
Potassium: 3.8 mmol/L (ref 3.5–5.1)
Sodium: 138 mmol/L (ref 135–145)

## 2020-07-04 NOTE — Progress Notes (Addendum)
[name]  Updated ;PTAR transferring pt to [facility] . Report given to [facility] SNF called, left number for callback .

## 2020-07-04 NOTE — Progress Notes (Signed)
  Speech Language Pathology Treatment: Dysphagia  Patient Details Name: Derek [name] MRN: [id number] DOB: August 22, 1927 Today's Date: 07/04/2020 Time: 3491-7915 SLP Time Calculation (min) (ACUTE ONLY): 16 min  Assessment / Plan / Recommendation Clinical Impression  Pt was seen for dysphagia treatment with meal tray during lunch. He consumed a meal of spaghetti, broccolli, and thin liquids via straw. Pt exhibited coughing three times during the meal and this was often immediately after food was placed in his mouth. Due to his baseline cough and time of coughing with relation to presentation of solid boluses, the possibility of coughing being unrelated to swallowing is considered. No s/sx of aspiration were observed with any liquids. It is recommended that the pt's current diet be continued. Discharge orders have been placed and pt would benefit from SLP services at next level of care to at least further monitor pt's swallow function.    HPI HPI: 86 year old male with history of paroxysmal atrial fibrillation on Eliquis, coronary artery disease status post BMS to CFX in 2014, mild/moderate aortic stenosis valve area 1.29 cm, echo 12/2017, dementia, CKD stage IIIa, OSA noncompliant with CPAP, HLD, HTN, GERD, with multiple falls last past 2 years, brought to the ED with loss of consciousness and fall. Initially passed nursing dysphagia screen, but later with +s/s aspiration of water.      SLP Plan  Continue with current plan of care       Recommendations  Diet recommendations: Regular;Thin liquid Liquids provided via: Cup;Straw Medication Administration: Whole meds with puree Supervision: Staff to assist with self feeding Compensations: Small sips/bites;Slow rate;Follow solids with liquid                Oral Care Recommendations: Oral care BID;Staff/trained caregiver to provide oral care Follow up Recommendations: Skilled Nursing facility SLP Visit Diagnosis: Dysphagia, unspecified  (R13.10) Plan: Continue with current plan of care       Pattie Flaharty I. Hardin Negus, [place], [place] Office number [phone] Pager [place] 07/04/2020, 12:58 PM

## 2020-07-04 NOTE — Plan of Care (Signed)
  Problem: Education: Goal: Knowledge of General Education information will improve Description Including pain rating scale, medication(s)/side effects and non-pharmacologic comfort measures Outcome: Progressing   Problem: Health Behavior/Discharge Planning: Goal: Ability to manage health-related needs will improve Outcome: Progressing   

## 2020-07-04 NOTE — Progress Notes (Signed)
Progress Note  Patient Name: Derek [name] Date of Encounter: 07/04/2020  [facility] Cardiologist: Belva Crome III, MD   Subjective   Had episodes of atrial fibrillation with rapid ventricular spots yesterday in the 120s to 130s.  Metoprolol 25 mg twice daily was added.  Feeling better.  Discussed with son.  Ready for discharge.  No chest pain no shortness of breath.  No further syncopal episodes.  Inpatient Medications    Scheduled Meds: . apixaban  2.5 mg Oral BID  . atorvastatin  40 mg Oral QPM  . donepezil  10 mg Oral QHS  . feeding supplement  237 mL Oral BID BM  . metoprolol tartrate  25 mg Oral BID  . multivitamin with minerals  1 tablet Oral Daily  . pantoprazole  40 mg Oral QAC breakfast   Continuous Infusions: . azithromycin 500 mg (07/03/20 2314)  . cefTRIAXone (ROCEPHIN)  IV 2 g (07/03/20 2213)   PRN Meds: acetaminophen **OR** acetaminophen, nitroGLYCERIN, ondansetron **OR** ondansetron (ZOFRAN) IV, polyethylene glycol   Vital Signs    Vitals:   07/03/20 1305 07/03/20 1450 07/03/20 2011 07/04/20 0626  BP: 131/73 126/72 (!) 142/79 (!) 157/84  Pulse: 61 99 83 (!) 42  Resp: 17 19 18 20   Temp: 97.9 F (36.6 C) (!) 97.5 F (36.4 C) 98.6 F (37 C) 97.6 F (36.4 C)  TempSrc: Oral Oral Oral Oral  SpO2: 91% 92% 96% 94%  Weight:    76.2 kg  Height:        Intake/Output Summary (Last 24 hours) at 07/04/2020 0937 Last data filed at 07/04/2020 0600 Gross per 24 hour  Intake 526.02 ml  Output 900 ml  Net -373.98 ml   Last 3 Weights 07/04/2020 07/03/2020 07/02/2020  Weight (lbs) 168 lb 177 lb 168 lb  Weight (kg) 76.204 kg 80.287 kg 76.204 kg      Telemetry    Previously A. fib with RVR 120s to 130s now sinus rhythm- Personal  GEN: No acute distress.  Elderly Neck: No JVD Cardiac: RRR, no murmurs, rubs, or gallops.  Respiratory: Clear to auscultation bilaterally. GI: Soft, nontender, non-distended  MS: No edema; No deformity. Neuro:   Nonfocal  Psych: Normal affect   Labs    High Sensitivity Troponin:   Recent Labs  Lab 06/28/20 1844 06/28/20 2118  TROPONINIHS 9 15      Chemistry Recent Labs  Lab 06/28/20 1844 06/28/20 1844 06/29/20 0210 06/29/20 0210 07/02/20 0937 07/03/20 0845 07/04/20 0145  NA 141   < > 138   < > 139 138 138  K 3.8   < > 3.8   < > 3.0* 3.4* 3.8  CL 110   < > 105   < > 108 103 104  CO2 21*   < > 24   < > 24 24 25   GLUCOSE 110*   < > 147*   < > 123* 109* 135*  BUN 25*   < > 24*   < > 21 18 22   CREATININE 1.22   < > 1.33*   < > 1.17 0.98 1.03  CALCIUM 8.5*   < > 9.2   < > 8.4* 8.7* 8.6*  PROT 5.6*  --  6.2*  --   --   --   --   ALBUMIN 3.2*  --  3.5  --   --   --   --   AST 25  --  26  --   --   --   --  ALT 20  --  21  --   --   --   --   ALKPHOS 73  --  79  --   --   --   --   BILITOT 0.8  --  0.9  --   --   --   --   GFRNONAA 56*   < > 50*   < > 59* >60 >60  ANIONGAP 10   < > 9   < > 7 11 9    < > = values in this interval not displayed.     Hematology Recent Labs  Lab 06/28/20 1844 06/29/20 0210 07/02/20 0937  WBC 14.0* 12.0* 10.5  RBC 4.86 4.52 3.61*  HGB 14.6 13.7 10.8*  HCT 44.2 40.8 32.4*  MCV 90.9 90.3 89.8  MCH 30.0 30.3 29.9  MCHC 33.0 33.6 33.3  RDW 13.6 13.5 13.4  PLT 215 211 181    BNP Recent Labs  Lab 06/28/20 1849  BNP 133.2*       Assessment & Plan    Atrial fibrillation paroxysmal with rapid ventricular response -Now back in sinus rhythm.  Continue with metoprolol 25 twice daily.  Earlier this medication was discontinued because of his severe orthostatic hypotension.  It looks like he does need some degree of rate controlling agent or beta-blocker.  We will continue with this low-dose which hopefully will not affect his blood pressure too much.  Severe orthostatic hypotension -Autonomic dysfunction as well -Discontinued all antihypertensives.  He is on metoprolol currently for A. fib rate control. -Willing to tolerate elevated blood  pressures such as 646 or 803 systolic because of his significant decline when in an upright position.  For instance in the ER he demonstrated decline from 1 60-1 70 down to 212 systolic.  Chronic anticoagulation -He continues with Eliquis appropriately dosed.  Had long discussion with son about timing of potentially coming off of the medicine especially with his falls and worsening cognitive decline.  At this time they would like to continue with Eliquis.  Appropriate for discharge currently.      For questions or updates, please contact [facility] Please consult [email or web address] for contact info under        Signed, Candee Furbish, MD  07/04/2020, 9:37 AM

## 2020-07-04 NOTE — Progress Notes (Signed)
Patient seen and examined this morning.  He is in normal sinus rhythm heart rate controlled blood pressure stable.  He was monitored overnight after starting metoprolol due to his A. fib.  Discussed with son at the bedside.  Patient appears cheerful.  Discussed with the cardiology okay to discharge with no change in medication from yesterday.  Please refer to discharge summary from yesterday.

## 2020-07-04 NOTE — TOC Transition Note (Signed)
Transition of Care [facility]) - CM/SW Discharge Note   Patient Details  Name: PANAGIOTIS OELKERS MRN: [id number] Date of Birth: 03/26/1927  Transition of Care [facility]) CM/SW Contact:  Trula Ore, [place] Phone Number: 07/04/2020, 11:31 AM   Clinical Narrative:     Patient will DC to: [name]   Anticipated DC date: 07/04/2020  Family notified: [name]  Transport by: Corey Harold  ?  Per MD patient ready for DC to [name] . RN, patient, patient's family, and facility notified of DC. Discharge Summary sent to facility. RN given number for report tele#[phone] RM#[phone].. DC packet on chart. DNR signed by MD on chart.Ambulance transport requested for patient.  CSW signing off.   Final next level of care: Skilled Nursing Facility Barriers to Discharge: No Barriers Identified   Patient Goals and CMS Choice   CMS Medicare.gov Compare Post Acute Care list provided to:: Patient Represenative (must comment) Marcello Moores) Choice offered to / list presented to : Adult Children Marcello Moores and [name])  Discharge Placement              Patient chooses bed at: [facility] Patient to be transferred to facility by: [place] Name of family member notified: [name] Patient and family notified of of transfer: 07/04/20  Discharge Plan and Services                                     Social Determinants of Health (SDOH) Interventions     Readmission Risk Interventions No flowsheet data found.

## 2020-07-09 DIAGNOSIS — Z20828 Contact with and (suspected) exposure to other viral communicable diseases: Secondary | ICD-10-CM | POA: Diagnosis not present

## 2020-07-14 DIAGNOSIS — G4733 Obstructive sleep apnea (adult) (pediatric): Secondary | ICD-10-CM | POA: Diagnosis not present

## 2020-07-14 DIAGNOSIS — I4891 Unspecified atrial fibrillation: Secondary | ICD-10-CM | POA: Diagnosis not present

## 2020-07-14 DIAGNOSIS — I350 Nonrheumatic aortic (valve) stenosis: Secondary | ICD-10-CM | POA: Diagnosis not present

## 2020-07-14 DIAGNOSIS — F0280 Dementia in other diseases classified elsewhere without behavioral disturbance: Secondary | ICD-10-CM | POA: Diagnosis not present

## 2020-07-14 DIAGNOSIS — E785 Hyperlipidemia, unspecified: Secondary | ICD-10-CM | POA: Diagnosis not present

## 2020-07-14 DIAGNOSIS — K219 Gastro-esophageal reflux disease without esophagitis: Secondary | ICD-10-CM | POA: Diagnosis not present

## 2020-07-14 DIAGNOSIS — R55 Syncope and collapse: Secondary | ICD-10-CM | POA: Diagnosis not present

## 2020-07-14 DIAGNOSIS — I129 Hypertensive chronic kidney disease with stage 1 through stage 4 chronic kidney disease, or unspecified chronic kidney disease: Secondary | ICD-10-CM | POA: Diagnosis not present

## 2020-07-14 DIAGNOSIS — I2510 Atherosclerotic heart disease of native coronary artery without angina pectoris: Secondary | ICD-10-CM | POA: Diagnosis not present

## 2020-07-14 DIAGNOSIS — J181 Lobar pneumonia, unspecified organism: Secondary | ICD-10-CM | POA: Diagnosis not present

## 2020-07-14 DIAGNOSIS — N1831 Chronic kidney disease, stage 3a: Secondary | ICD-10-CM | POA: Diagnosis not present

## 2020-07-14 DIAGNOSIS — R296 Repeated falls: Secondary | ICD-10-CM | POA: Diagnosis not present

## 2020-07-20 DIAGNOSIS — T148XXA Other injury of unspecified body region, initial encounter: Secondary | ICD-10-CM | POA: Diagnosis not present

## 2020-07-23 ENCOUNTER — Other Ambulatory Visit: Payer: Self-pay | Admitting: *Deleted

## 2020-07-23 NOTE — Patient Outreach (Signed)
Member screened for potential [facility] Care Management needs as a benefit of [address] Medicare.  Per Patient Derek [name] member resides in [facility] [facility].   Communication sent to [name] SW to collaborate about anticipated dc plans and potential [facility] Care Management needs.  Will continue to follow while member resides in SNF.    Marthenia Rolling, MSN, RN,BSN [place] Acute Care Coordinator [phone] [facility]) [phone]  (Toll free office)

## 2020-07-24 ENCOUNTER — Other Ambulatory Visit: Payer: Self-pay | Admitting: *Deleted

## 2020-07-24 NOTE — Patient Outreach (Signed)
[facility] Post- Acute Care Coordinator follow up. Member screened for potential [facility] Care Management needs as a benefit of [place] Medicare.  Update received from [name] SN[facility] SW indicating member likely to transition home with caregiver assistance on this Friday.  Will plan outreach to Mr. Bolding's family to discuss [place] Management services.  Will continue to follow for transition plans while member resides in SNF.    Marthenia Rolling, MSN, RN,BSN [place] Acute Care Coordinator [phone] [facility]) [phone]  (Toll free office)

## 2020-07-25 ENCOUNTER — Telehealth: Payer: Self-pay | Admitting: *Deleted

## 2020-07-25 ENCOUNTER — Other Ambulatory Visit: Payer: Self-pay | Admitting: Physician Assistant

## 2020-07-25 MED ORDER — METOPROLOL TARTRATE 25 MG PO TABS
25.0000 mg | ORAL_TABLET | Freq: Two times a day (BID) | ORAL | 0 refills | Status: DC
Start: 2020-07-25 — End: 2020-07-25

## 2020-07-25 MED ORDER — METOPROLOL TARTRATE 25 MG PO TABS
25.0000 mg | ORAL_TABLET | Freq: Two times a day (BID) | ORAL | 0 refills | Status: DC
Start: 2020-07-25 — End: 2020-08-21

## 2020-07-25 NOTE — Telephone Encounter (Signed)
Pt's son called in requesting refill for Lopressor 25 bid.  Pt was recently in hospital and meds was changed. Sent in #60 to [facility], as pt is scheduled to see Dr. Tamala Julian 08/17/20.

## 2020-07-25 NOTE — Addendum Note (Signed)
Addended by: Gaetano Net on: 07/25/2020 04:11 PM   Modules accepted: Orders

## 2020-07-26 DIAGNOSIS — T148XXA Other injury of unspecified body region, initial encounter: Secondary | ICD-10-CM | POA: Diagnosis not present

## 2020-07-29 DIAGNOSIS — S32512D Fracture of superior rim of left pubis, subsequent encounter for fracture with routine healing: Secondary | ICD-10-CM | POA: Diagnosis not present

## 2020-07-29 DIAGNOSIS — I480 Paroxysmal atrial fibrillation: Secondary | ICD-10-CM | POA: Diagnosis not present

## 2020-07-29 DIAGNOSIS — I2510 Atherosclerotic heart disease of native coronary artery without angina pectoris: Secondary | ICD-10-CM | POA: Diagnosis not present

## 2020-07-29 DIAGNOSIS — F0390 Unspecified dementia without behavioral disturbance: Secondary | ICD-10-CM | POA: Diagnosis not present

## 2020-07-29 DIAGNOSIS — Z7901 Long term (current) use of anticoagulants: Secondary | ICD-10-CM | POA: Diagnosis not present

## 2020-07-29 DIAGNOSIS — I350 Nonrheumatic aortic (valve) stenosis: Secondary | ICD-10-CM | POA: Diagnosis not present

## 2020-07-29 DIAGNOSIS — Z9181 History of falling: Secondary | ICD-10-CM | POA: Diagnosis not present

## 2020-07-29 DIAGNOSIS — N1831 Chronic kidney disease, stage 3a: Secondary | ICD-10-CM | POA: Diagnosis not present

## 2020-07-29 DIAGNOSIS — S32592A Other specified fracture of left pubis, initial encounter for closed fracture: Secondary | ICD-10-CM | POA: Diagnosis not present

## 2020-07-29 DIAGNOSIS — K219 Gastro-esophageal reflux disease without esophagitis: Secondary | ICD-10-CM | POA: Diagnosis not present

## 2020-07-29 DIAGNOSIS — E440 Moderate protein-calorie malnutrition: Secondary | ICD-10-CM | POA: Diagnosis not present

## 2020-07-29 DIAGNOSIS — I129 Hypertensive chronic kidney disease with stage 1 through stage 4 chronic kidney disease, or unspecified chronic kidney disease: Secondary | ICD-10-CM | POA: Diagnosis not present

## 2020-07-29 DIAGNOSIS — G4733 Obstructive sleep apnea (adult) (pediatric): Secondary | ICD-10-CM | POA: Diagnosis not present

## 2020-07-29 DIAGNOSIS — E785 Hyperlipidemia, unspecified: Secondary | ICD-10-CM | POA: Diagnosis not present

## 2020-07-30 ENCOUNTER — Other Ambulatory Visit: Payer: Self-pay | Admitting: *Deleted

## 2020-07-30 NOTE — Patient Outreach (Signed)
[place] Coordinator follow up. Member screened for potential [facility] Care Management needs as a benefit of [place] Medicare.  Verified in Patient [place] that Mr. Wintle transitioned to home on 07/27/20.  Has  [facility]  Telephone call made to member's son/DPR Phil Dopp, Brooke Bonito. [phone]. Patient identifiers confirmed. Schylar reports they are trying to get member reacclimated with being home. States member has a robust long term care policy and they are in the process of looking for caregivers for 8 hrs a day. Darrien also reports they are looking into purchasing a lift chair as well.   Istvan states he canceled PCP appointment with Dr. Delfina Redwood today because "things have been so hectic." States he will reschedule appointment for next week or week after.  [place] PT has had home visit. Rob reports he has been keeping a close eye on member and is trying to prevent falls and ensure Mr. Mullane "moves around safely."  Mr. Catala and son live together.   Explained [place] Management services. Phil Dopp (son/DPR) is agreeable. Explained [facility] Care Management will not replace or interfere services provided by home health. Writer to email [facility] Management and writer's contact information.   Kacee (son) reports he and his sister are considering other options regarding keeping member home vs staying long term in a facility.   Writer to make referral to [place] for care coordination. Mr. Bates has medical history of Afib, aortic stenosis, CKD, OSA, HLD, HTN, multiple falls, dementia.    Marthenia Rolling, MSN, RN,BSN [place] Acute Care Coordinator [phone] [facility]) [phone]  (Toll free office)

## 2020-07-31 DIAGNOSIS — F0390 Unspecified dementia without behavioral disturbance: Secondary | ICD-10-CM | POA: Diagnosis not present

## 2020-07-31 DIAGNOSIS — S32592A Other specified fracture of left pubis, initial encounter for closed fracture: Secondary | ICD-10-CM | POA: Diagnosis not present

## 2020-07-31 DIAGNOSIS — I129 Hypertensive chronic kidney disease with stage 1 through stage 4 chronic kidney disease, or unspecified chronic kidney disease: Secondary | ICD-10-CM | POA: Diagnosis not present

## 2020-07-31 DIAGNOSIS — S32512D Fracture of superior rim of left pubis, subsequent encounter for fracture with routine healing: Secondary | ICD-10-CM | POA: Diagnosis not present

## 2020-07-31 DIAGNOSIS — N1831 Chronic kidney disease, stage 3a: Secondary | ICD-10-CM | POA: Diagnosis not present

## 2020-07-31 DIAGNOSIS — I480 Paroxysmal atrial fibrillation: Secondary | ICD-10-CM | POA: Diagnosis not present

## 2020-08-01 ENCOUNTER — Other Ambulatory Visit: Payer: Self-pay

## 2020-08-01 ENCOUNTER — Other Ambulatory Visit: Payer: Self-pay | Admitting: *Deleted

## 2020-08-01 DIAGNOSIS — N1831 Chronic kidney disease, stage 3a: Secondary | ICD-10-CM | POA: Diagnosis not present

## 2020-08-01 DIAGNOSIS — S32512D Fracture of superior rim of left pubis, subsequent encounter for fracture with routine healing: Secondary | ICD-10-CM | POA: Diagnosis not present

## 2020-08-01 DIAGNOSIS — I129 Hypertensive chronic kidney disease with stage 1 through stage 4 chronic kidney disease, or unspecified chronic kidney disease: Secondary | ICD-10-CM | POA: Diagnosis not present

## 2020-08-01 DIAGNOSIS — S32592A Other specified fracture of left pubis, initial encounter for closed fracture: Secondary | ICD-10-CM | POA: Diagnosis not present

## 2020-08-01 DIAGNOSIS — I480 Paroxysmal atrial fibrillation: Secondary | ICD-10-CM | POA: Diagnosis not present

## 2020-08-01 DIAGNOSIS — F0390 Unspecified dementia without behavioral disturbance: Secondary | ICD-10-CM | POA: Diagnosis not present

## 2020-08-01 NOTE — Patient Outreach (Signed)
[facility]) Care Management  08/01/2020  Derek [name] 20-Aug-1927 [id number]  Initial outreach for [name]  Talked with pt's son [name]. He has been primary caregiver for last 6 years and they have lived together that long. [place] permission for intake. Validated [facility] servcies as described by co-worker, Marthenia Rolling. After discussion it has been decided they do not need our services at this time but would like to have access in the future should needs arise.  Brooke Bonito. Has activated [name]' long term care insurance plan and they will get a lift chair and also an 8 hour a day caregiver. Pt is also a veteran and has benefits there.   NP to send a successful outreach letter, sent Jr. My information via text. Encouraged him to reach out if needs arise that he needs guidance with.  Eulah Pont. Myrtie Neither, MSN, [facility] Gerontological Nurse Practitioner [facility] Care Management [phone]

## 2020-08-07 DIAGNOSIS — I480 Paroxysmal atrial fibrillation: Secondary | ICD-10-CM | POA: Diagnosis not present

## 2020-08-07 DIAGNOSIS — S32592A Other specified fracture of left pubis, initial encounter for closed fracture: Secondary | ICD-10-CM | POA: Diagnosis not present

## 2020-08-07 DIAGNOSIS — I129 Hypertensive chronic kidney disease with stage 1 through stage 4 chronic kidney disease, or unspecified chronic kidney disease: Secondary | ICD-10-CM | POA: Diagnosis not present

## 2020-08-07 DIAGNOSIS — S32512D Fracture of superior rim of left pubis, subsequent encounter for fracture with routine healing: Secondary | ICD-10-CM | POA: Diagnosis not present

## 2020-08-07 DIAGNOSIS — N1831 Chronic kidney disease, stage 3a: Secondary | ICD-10-CM | POA: Diagnosis not present

## 2020-08-07 DIAGNOSIS — F0390 Unspecified dementia without behavioral disturbance: Secondary | ICD-10-CM | POA: Diagnosis not present

## 2020-08-08 DIAGNOSIS — F0390 Unspecified dementia without behavioral disturbance: Secondary | ICD-10-CM | POA: Diagnosis not present

## 2020-08-08 DIAGNOSIS — I129 Hypertensive chronic kidney disease with stage 1 through stage 4 chronic kidney disease, or unspecified chronic kidney disease: Secondary | ICD-10-CM | POA: Diagnosis not present

## 2020-08-08 DIAGNOSIS — S32512D Fracture of superior rim of left pubis, subsequent encounter for fracture with routine healing: Secondary | ICD-10-CM | POA: Diagnosis not present

## 2020-08-08 DIAGNOSIS — N1831 Chronic kidney disease, stage 3a: Secondary | ICD-10-CM | POA: Diagnosis not present

## 2020-08-08 DIAGNOSIS — S32592A Other specified fracture of left pubis, initial encounter for closed fracture: Secondary | ICD-10-CM | POA: Diagnosis not present

## 2020-08-08 DIAGNOSIS — I480 Paroxysmal atrial fibrillation: Secondary | ICD-10-CM | POA: Diagnosis not present

## 2020-08-13 ENCOUNTER — Inpatient Hospital Stay (HOSPITAL_COMMUNITY)
Admission: EM | Admit: 2020-08-13 | Discharge: 2020-08-17 | DRG: 312 | Disposition: A | Discharge status: Home Health | Payer: Medicare Other | Attending: Family Medicine | Admitting: Family Medicine

## 2020-08-13 ENCOUNTER — Emergency Department (HOSPITAL_COMMUNITY): Payer: Medicare Other

## 2020-08-13 DIAGNOSIS — G9341 Metabolic encephalopathy: Secondary | ICD-10-CM | POA: Diagnosis not present

## 2020-08-13 DIAGNOSIS — R55 Syncope and collapse: Secondary | ICD-10-CM | POA: Diagnosis not present

## 2020-08-13 DIAGNOSIS — Z955 Presence of coronary angioplasty implant and graft: Secondary | ICD-10-CM

## 2020-08-13 DIAGNOSIS — Z8701 Personal history of pneumonia (recurrent): Secondary | ICD-10-CM

## 2020-08-13 DIAGNOSIS — N1831 Chronic kidney disease, stage 3a: Secondary | ICD-10-CM | POA: Diagnosis not present

## 2020-08-13 DIAGNOSIS — I482 Chronic atrial fibrillation, unspecified: Secondary | ICD-10-CM | POA: Diagnosis present

## 2020-08-13 DIAGNOSIS — R011 Cardiac murmur, unspecified: Secondary | ICD-10-CM | POA: Diagnosis present

## 2020-08-13 DIAGNOSIS — I130 Hypertensive heart and chronic kidney disease with heart failure and stage 1 through stage 4 chronic kidney disease, or unspecified chronic kidney disease: Secondary | ICD-10-CM | POA: Diagnosis present

## 2020-08-13 DIAGNOSIS — I480 Paroxysmal atrial fibrillation: Secondary | ICD-10-CM | POA: Diagnosis not present

## 2020-08-13 DIAGNOSIS — I5032 Chronic diastolic (congestive) heart failure: Secondary | ICD-10-CM | POA: Diagnosis present

## 2020-08-13 DIAGNOSIS — S32592A Other specified fracture of left pubis, initial encounter for closed fracture: Secondary | ICD-10-CM | POA: Diagnosis not present

## 2020-08-13 DIAGNOSIS — N179 Acute kidney failure, unspecified: Secondary | ICD-10-CM | POA: Diagnosis present

## 2020-08-13 DIAGNOSIS — G9340 Encephalopathy, unspecified: Secondary | ICD-10-CM | POA: Diagnosis not present

## 2020-08-13 DIAGNOSIS — I951 Orthostatic hypotension: Principal | ICD-10-CM | POA: Diagnosis present

## 2020-08-13 DIAGNOSIS — Z20822 Contact with and (suspected) exposure to covid-19: Secondary | ICD-10-CM | POA: Diagnosis not present

## 2020-08-13 DIAGNOSIS — Z79899 Other long term (current) drug therapy: Secondary | ICD-10-CM

## 2020-08-13 DIAGNOSIS — I252 Old myocardial infarction: Secondary | ICD-10-CM

## 2020-08-13 DIAGNOSIS — Z66 Do not resuscitate: Secondary | ICD-10-CM | POA: Diagnosis not present

## 2020-08-13 DIAGNOSIS — R531 Weakness: Secondary | ICD-10-CM | POA: Diagnosis not present

## 2020-08-13 DIAGNOSIS — Z8673 Personal history of transient ischemic attack (TIA), and cerebral infarction without residual deficits: Secondary | ICD-10-CM

## 2020-08-13 DIAGNOSIS — Z7901 Long term (current) use of anticoagulants: Secondary | ICD-10-CM

## 2020-08-13 DIAGNOSIS — R4182 Altered mental status, unspecified: Secondary | ICD-10-CM | POA: Diagnosis not present

## 2020-08-13 DIAGNOSIS — E785 Hyperlipidemia, unspecified: Secondary | ICD-10-CM | POA: Diagnosis present

## 2020-08-13 DIAGNOSIS — R0902 Hypoxemia: Secondary | ICD-10-CM | POA: Diagnosis not present

## 2020-08-13 DIAGNOSIS — K219 Gastro-esophageal reflux disease without esophagitis: Secondary | ICD-10-CM | POA: Diagnosis present

## 2020-08-13 DIAGNOSIS — Z880 Allergy status to penicillin: Secondary | ICD-10-CM

## 2020-08-13 DIAGNOSIS — S32512D Fracture of superior rim of left pubis, subsequent encounter for fracture with routine healing: Secondary | ICD-10-CM | POA: Diagnosis not present

## 2020-08-13 DIAGNOSIS — H748X2 Other specified disorders of left middle ear and mastoid: Secondary | ICD-10-CM | POA: Diagnosis not present

## 2020-08-13 DIAGNOSIS — I2510 Atherosclerotic heart disease of native coronary artery without angina pectoris: Secondary | ICD-10-CM | POA: Diagnosis present

## 2020-08-13 DIAGNOSIS — Z888 Allergy status to other drugs, medicaments and biological substances status: Secondary | ICD-10-CM

## 2020-08-13 DIAGNOSIS — R9431 Abnormal electrocardiogram [ECG] [EKG]: Secondary | ICD-10-CM

## 2020-08-13 DIAGNOSIS — Z823 Family history of stroke: Secondary | ICD-10-CM

## 2020-08-13 DIAGNOSIS — Z882 Allergy status to sulfonamides status: Secondary | ICD-10-CM

## 2020-08-13 DIAGNOSIS — I10 Essential (primary) hypertension: Secondary | ICD-10-CM | POA: Diagnosis present

## 2020-08-13 DIAGNOSIS — G4733 Obstructive sleep apnea (adult) (pediatric): Secondary | ICD-10-CM | POA: Diagnosis present

## 2020-08-13 DIAGNOSIS — I350 Nonrheumatic aortic (valve) stenosis: Secondary | ICD-10-CM | POA: Diagnosis present

## 2020-08-13 DIAGNOSIS — F0390 Unspecified dementia without behavioral disturbance: Secondary | ICD-10-CM | POA: Diagnosis not present

## 2020-08-13 DIAGNOSIS — I129 Hypertensive chronic kidney disease with stage 1 through stage 4 chronic kidney disease, or unspecified chronic kidney disease: Secondary | ICD-10-CM | POA: Diagnosis not present

## 2020-08-13 DIAGNOSIS — R29810 Facial weakness: Secondary | ICD-10-CM | POA: Diagnosis not present

## 2020-08-13 NOTE — ED Triage Notes (Signed)
PT BIB [facility] for AMS from baseline and syncopal episode at dinner around 2230. Pt has hx of dementia and stroke.

## 2020-08-14 ENCOUNTER — Encounter (HOSPITAL_COMMUNITY): Payer: Self-pay | Admitting: Internal Medicine

## 2020-08-14 ENCOUNTER — Other Ambulatory Visit: Payer: Self-pay

## 2020-08-14 ENCOUNTER — Emergency Department (HOSPITAL_COMMUNITY): Payer: Medicare Other

## 2020-08-14 DIAGNOSIS — R9431 Abnormal electrocardiogram [ECG] [EKG]: Secondary | ICD-10-CM

## 2020-08-14 DIAGNOSIS — I480 Paroxysmal atrial fibrillation: Secondary | ICD-10-CM

## 2020-08-14 DIAGNOSIS — I10 Essential (primary) hypertension: Secondary | ICD-10-CM

## 2020-08-14 DIAGNOSIS — Z7901 Long term (current) use of anticoagulants: Secondary | ICD-10-CM | POA: Diagnosis not present

## 2020-08-14 DIAGNOSIS — E782 Mixed hyperlipidemia: Secondary | ICD-10-CM | POA: Diagnosis not present

## 2020-08-14 DIAGNOSIS — H748X2 Other specified disorders of left middle ear and mastoid: Secondary | ICD-10-CM | POA: Diagnosis not present

## 2020-08-14 DIAGNOSIS — R4182 Altered mental status, unspecified: Secondary | ICD-10-CM | POA: Diagnosis not present

## 2020-08-14 DIAGNOSIS — R55 Syncope and collapse: Secondary | ICD-10-CM | POA: Diagnosis not present

## 2020-08-14 DIAGNOSIS — G9340 Encephalopathy, unspecified: Secondary | ICD-10-CM

## 2020-08-14 LAB — URINALYSIS, ROUTINE W REFLEX MICROSCOPIC
Bilirubin Urine: NEGATIVE
Glucose, UA: NEGATIVE mg/dL
Hgb urine dipstick: NEGATIVE
Ketones, ur: NEGATIVE mg/dL
Leukocytes,Ua: NEGATIVE
Nitrite: NEGATIVE
Protein, ur: 100 mg/dL — AB
Specific Gravity, Urine: 1.012 (ref 1.005–1.030)
pH: 5 (ref 5.0–8.0)

## 2020-08-14 LAB — CBC WITH DIFFERENTIAL/PLATELET
Abs Immature Granulocytes: 0.03 10*3/uL (ref 0.00–0.07)
Basophils Absolute: 0.1 10*3/uL (ref 0.0–0.1)
Basophils Relative: 1 %
Eosinophils Absolute: 0.3 10*3/uL (ref 0.0–0.5)
Eosinophils Relative: 3 %
HCT: 45.5 % (ref 39.0–52.0)
Hemoglobin: 14.9 g/dL (ref 13.0–17.0)
Immature Granulocytes: 0 %
Lymphocytes Relative: 14 %
Lymphs Abs: 1.4 10*3/uL (ref 0.7–4.0)
MCH: 30.2 pg (ref 26.0–34.0)
MCHC: 32.7 g/dL (ref 30.0–36.0)
MCV: 92.3 fL (ref 80.0–100.0)
Monocytes Absolute: 0.7 10*3/uL (ref 0.1–1.0)
Monocytes Relative: 7 %
Neutro Abs: 7.4 10*3/uL (ref 1.7–7.7)
Neutrophils Relative %: 75 %
Platelets: 204 10*3/uL (ref 150–400)
RBC: 4.93 MIL/uL (ref 4.22–5.81)
RDW: 13.3 % (ref 11.5–15.5)
WBC: 10 10*3/uL (ref 4.0–10.5)
nRBC: 0 % (ref 0.0–0.2)

## 2020-08-14 LAB — COMPREHENSIVE METABOLIC PANEL
ALT: 18 U/L (ref 0–44)
AST: 25 U/L (ref 15–41)
Albumin: 3.5 g/dL (ref 3.5–5.0)
Alkaline Phosphatase: 118 U/L (ref 38–126)
Anion gap: 11 (ref 5–15)
BUN: 21 mg/dL (ref 8–23)
CO2: 25 mmol/L (ref 22–32)
Calcium: 9.5 mg/dL (ref 8.9–10.3)
Chloride: 101 mmol/L (ref 98–111)
Creatinine, Ser: 1.35 mg/dL — ABNORMAL HIGH (ref 0.61–1.24)
GFR, Estimated: 50 mL/min — ABNORMAL LOW (ref >60)
Glucose, Bld: 110 mg/dL — ABNORMAL HIGH (ref 70–99)
Potassium: 4.2 mmol/L (ref 3.5–5.1)
Sodium: 137 mmol/L (ref 135–145)
Total Bilirubin: 1 mg/dL (ref 0.3–1.2)
Total Protein: 6.7 g/dL (ref 6.5–8.1)

## 2020-08-14 LAB — LACTIC ACID, PLASMA: Lactic Acid, Venous: 1.2 mmol/L (ref 0.5–1.9)

## 2020-08-14 LAB — RESP PANEL BY RT-PCR (FLU A&B, COVID) ARPGX2
Influenza A by PCR: NEGATIVE
Influenza B by PCR: NEGATIVE
SARS Coronavirus 2 by RT PCR: NEGATIVE

## 2020-08-14 LAB — TROPONIN I (HIGH SENSITIVITY)
Troponin I (High Sensitivity): 13 ng/L (ref <18)
Troponin I (High Sensitivity): 12 ng/L (ref <18)

## 2020-08-14 LAB — BRAIN NATRIURETIC PEPTIDE: B Natriuretic Peptide: 265.3 pg/mL — ABNORMAL HIGH (ref 0.0–100.0)

## 2020-08-14 LAB — TSH: TSH: 0.947 u[IU]/mL (ref 0.350–4.500)

## 2020-08-14 MED ORDER — ACETAMINOPHEN 650 MG RE SUPP: 650.0000 mg | Freq: Four times a day (QID) | RECTAL | Status: DC | PRN

## 2020-08-14 MED ORDER — ALBUTEROL SULFATE (2.5 MG/3ML) 0.083% IN NEBU: 2.5000 mg | INHALATION_SOLUTION | Freq: Four times a day (QID) | RESPIRATORY_TRACT | Status: DC | PRN

## 2020-08-14 MED ORDER — SODIUM CHLORIDE 0.9 % IV SOLN: Freq: Once | INTRAVENOUS | Status: AC

## 2020-08-14 MED ORDER — METOPROLOL TARTRATE 25 MG PO TABS
25.0000 mg | ORAL_TABLET | Freq: Two times a day (BID) | ORAL | Status: DC
  Administered 2020-08-14 – 2020-08-17 (×6): 25 mg via ORAL
  Filled 2020-08-14 (×6): qty 1

## 2020-08-14 MED ORDER — DONEPEZIL HCL 10 MG PO TABS: 10.0000 mg | ORAL_TABLET | Freq: Every day | ORAL | Status: DC

## 2020-08-14 MED ORDER — ATORVASTATIN CALCIUM 40 MG PO TABS
40.0000 mg | ORAL_TABLET | Freq: Every day | ORAL | Status: DC
  Administered 2020-08-14 – 2020-08-17 (×4): 40 mg via ORAL
  Filled 2020-08-14 (×4): qty 1

## 2020-08-14 MED ORDER — SODIUM CHLORIDE 0.9 % IV BOLUS
500.0000 mL | Freq: Once | INTRAVENOUS | Status: AC
  Administered 2020-08-14: 500 mL via INTRAVENOUS

## 2020-08-14 MED ORDER — LABETALOL HCL 5 MG/ML IV SOLN: 10.0000 mg | Freq: Once | INTRAVENOUS | Status: DC

## 2020-08-14 MED ORDER — PANTOPRAZOLE SODIUM 40 MG PO TBEC
40.0000 mg | DELAYED_RELEASE_TABLET | Freq: Every day | ORAL | Status: DC
  Administered 2020-08-14: 40 mg via ORAL
  Filled 2020-08-14: qty 1

## 2020-08-14 MED ORDER — APIXABAN 2.5 MG PO TABS
2.5000 mg | ORAL_TABLET | Freq: Two times a day (BID) | ORAL | Status: DC
  Administered 2020-08-14 – 2020-08-17 (×6): 2.5 mg via ORAL
  Filled 2020-08-14 (×8): qty 1

## 2020-08-14 MED ORDER — SODIUM CHLORIDE 0.9% FLUSH
3.0000 mL | Freq: Two times a day (BID) | INTRAVENOUS | Status: DC
  Administered 2020-08-14 – 2020-08-17 (×6): 3 mL via INTRAVENOUS

## 2020-08-14 MED ORDER — ACETAMINOPHEN 325 MG PO TABS
650.0000 mg | ORAL_TABLET | Freq: Four times a day (QID) | ORAL | Status: DC | PRN
  Filled 2020-08-14: qty 2
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
## 2020-08-14 NOTE — Evaluation (Signed)
Physical Therapy Evaluation Patient Details Name: Derek [name] MRN: [id number] DOB: 04/07/1927 Today's Date: 08/14/2020   History of Present Illness  Pt is a 86 y/o male admitted secondary to syncopal episode. PMH includes dementia, HTN and s/p kyphoplasty.   Clinical Impression  Pt admitted secondary to problem above with deficits below. Pt with cognitive deficits, so unsure of accuracy of home information and PLOF. Pt with increased difficulty sequencing when attempting mobility tasks. Required mod A +2 to stand and max A +2 to attempt to take side steps at EOB. Feel pt is at increased risk for falls and will require increased assist at d/c. Will continue to follow acutely to maximize functional mobility independence and safety.     Follow Up Recommendations SNF;Supervision/Assistance - 24 hour    Equipment Recommendations  Rolling walker with 5" wheels;3in1 (PT)    Recommendations for Other Services       Precautions / Restrictions Precautions Precautions: Fall Restrictions Weight Bearing Restrictions: No      Mobility  Bed Mobility Overal bed mobility: Needs Assistance Bed Mobility: Supine to Sit;Sit to Supine     Supine to sit: Max assist;+2 for physical assistance Sit to supine: Max assist;+2 for physical assistance   General bed mobility comments: Max A for LE and trunk assist. Also required assist to scoot hips to EOB.     Transfers Overall transfer level: Needs assistance Equipment used: 2 person hand held assist Transfers: Sit to/from Stand Sit to Stand: Mod assist;+2 physical assistance         General transfer comment: Mod A +2 for steadying and lift assist. Pt with posterior lean and requiring manual assist to correct posture.   Ambulation/Gait             General Gait Details: Attempted to take side steps, however, pt with increased difficulty sequencing. Able to take side steps with LLE, however, required physical assist to take step with  RLE.   Stairs            Wheelchair Mobility    Modified Rankin (Stroke Patients Only)       Balance Overall balance assessment: Needs assistance Sitting-balance support: No upper extremity supported;Feet supported Sitting balance-Leahy Scale: Fair   Postural control: Posterior lean Standing balance support: Bilateral upper extremity supported Standing balance-Leahy Scale: Poor Standing balance comment: Reliant on BUE and external support                              Pertinent Vitals/Pain Pain Assessment: Faces Faces Pain Scale: No hurt    Home Living Family/patient expects to be discharged to:: Private residence Living Arrangements: Alone   Type of Home: House Home Access: Level entry     Home Layout: One level Home Equipment: None Additional Comments: Pt reporting conflicting information, so unsure of accuracy. Pt initially reports he lives alone, however, then reports he lives with his wife.     Prior Function Level of Independence: Independent         Comments: Pt reports he is independent with ambulation and can perform ADLs. Unsure of accuracy given cognitive deficits.      Hand Dominance        Extremity/Trunk Assessment   Upper Extremity Assessment Upper Extremity Assessment: Defer to OT evaluation    Lower Extremity Assessment Lower Extremity Assessment: Generalized weakness    Cervical / Trunk Assessment Cervical / Trunk Assessment: Kyphotic  Communication   Communication:  No difficulties  Cognition Arousal/Alertness: Awake/alert Behavior During Therapy: WFL for tasks assessed/performed Overall Cognitive Status: No family/caregiver present to determine baseline cognitive functioning                                 General Comments: Pt with hx of dementia. Memory deficits noted. Pt also with difficulty sequencing when performing tasks, requiring max multimodal cues.       General Comments       Exercises     Assessment/Plan    PT Assessment Patient needs continued PT services  PT Problem List Decreased strength;Decreased balance;Decreased mobility;Decreased activity tolerance;Decreased knowledge of use of DME;Decreased safety awareness;Decreased knowledge of precautions;Decreased cognition;Decreased coordination       PT Treatment Interventions DME instruction;Gait training;Functional mobility training;Therapeutic activities;Therapeutic exercise;Neuromuscular re-education;Balance training;Patient/family education    PT Goals (Current goals can be found in the Care Plan section)  Acute Rehab PT Goals PT Goal Formulation: Patient unable to participate in goal setting Time For Goal Achievement: 08/28/20 Potential to Achieve Goals: Fair    Frequency Min 2X/week   Barriers to discharge Other (comment) unsure of caregiver support     Co-evaluation PT/OT/SLP Co-Evaluation/Treatment: Yes Reason for Co-Treatment: Complexity of the patient's impairments (multi-system involvement);For patient/therapist safety;To address functional/ADL transfers PT goals addressed during session: Mobility/safety with mobility;Balance         AM-PAC PT "6 Clicks" Mobility  Outcome Measure Help needed turning from your back to your side while in a flat bed without using bedrails?: A Lot Help needed moving from lying on your back to sitting on the side of a flat bed without using bedrails?: A Lot Help needed moving to and from a bed to a chair (including a wheelchair)?: A Lot Help needed standing up from a chair using your arms (e.g., wheelchair or bedside chair)?: A Lot Help needed to walk in hospital room?: Total Help needed climbing 3-5 steps with a railing? : Total 6 Click Score: 10    End of Session Equipment Utilized During Treatment: Gait belt Activity Tolerance: Patient tolerated treatment well Patient left: in bed;with call bell/phone within reach (on stretcher in ED ) Nurse  Communication: Mobility status PT Visit Diagnosis: Unsteadiness on feet (R26.81);Muscle weakness (generalized) (M62.81);Difficulty in walking, not elsewhere classified (R26.2)    Time: 5597-4163 PT Time Calculation (min) (ACUTE ONLY): 22 min   Charges:   PT Evaluation $PT Eval Moderate Complexity: 1 Mod          Reuel Derby, PT, DPT  Acute Rehabilitation Services  Pager: [phone] Office: [phone]   Rudean Hitt 08/14/2020, 11:50 AM

## 2020-08-14 NOTE — ED Notes (Signed)
Therapy at bedside.

## 2020-08-14 NOTE — Evaluation (Addendum)
Occupational Therapy Evaluation Patient Details Name: Derek [name] MRN: [id number] DOB: 06-11-1927 Today's Date: 08/14/2020    History of Present Illness Pt is a 86 y/o male admitted secondary to syncopal episode. PMH includes dementia, HTN and s/p kyphoplasty.    Clinical Impression   Pt pleasantly confused and cooperative throughout session today. Pt unreliable historian. Pt today is mod A to feed himself breakfast (potentially positional due to being in bed - HOB elevated) or use of spoon? Pt is max A +2 for bed mobility, mod to max A +2 to come into standing, max multimodal cues for taking side steps up the bed. Pt's diaper brief wet, and total A for changing it and cleaning peri area. At this time recommending SNF post-acute due to increased needs in physical assist - should this change and become more like baseline and family feels they can care for his adequately Pt with hx of dementia tend to recover better in home/familiar environment - but again Pt will need to demonstrate improvement in ability to transfer for ADL.  Of Note: BP consistent throughout session, no drop with positional changes    Follow Up Recommendations  SNF;Supervision/Assistance - 24 hour    Equipment Recommendations  3 in 1 bedside commode;Wheelchair (measurements OT);Wheelchair cushion (measurements OT) (TBD when family can confirm needs)    Recommendations for Other Services       Precautions / Restrictions Precautions Precautions: Fall Restrictions Weight Bearing Restrictions: No      Mobility Bed Mobility Overal bed mobility: Needs Assistance Bed Mobility: Supine to Sit;Sit to Supine     Supine to sit: Max assist;+2 for physical assistance Sit to supine: Max assist;+2 for physical assistance   General bed mobility comments: Max A for LE and trunk assist. Also required assist to scoot hips to EOB.     Transfers Overall transfer level: Needs assistance Equipment used: 2 person hand held  assist Transfers: Sit to/from Stand Sit to Stand: Mod assist;+2 physical assistance         General transfer comment: Mod A +2 for steadying and lift assist. Pt with posterior lean and requiring manual assist to correct posture.     Balance Overall balance assessment: Needs assistance Sitting-balance support: No upper extremity supported;Feet supported Sitting balance-Leahy Scale: Fair   Postural control: Posterior lean Standing balance support: Bilateral upper extremity supported Standing balance-Leahy Scale: Poor Standing balance comment: Reliant on BUE and external support                            ADL either performed or assessed with clinical judgement   ADL Overall ADL's : Needs assistance/impaired Eating/Feeding: Moderate assistance;Bed level (HOB elevated) Eating/Feeding Details (indicate cue type and reason): Pt required assist to load spoon and bring to mouth without spilling food Grooming: Minimal assistance;Sitting;Wash/dry face   Upper Body Bathing: Moderate assistance;Sitting   Lower Body Bathing: Maximal assistance;Sitting/lateral leans   Upper Body Dressing : Moderate assistance;Sitting   Lower Body Dressing: Maximal assistance;Sit to/from stand;+2 for physical assistance   Toilet Transfer: Moderate assistance;+2 for physical assistance;+2 for safety/equipment Toilet Transfer Details (indicate cue type and reason): face to face transfer [place] and Hygiene: Total assistance Toileting - Clothing Manipulation Details (indicate cue type and reason): use of diaper brief currently, changed out wet for clean/dry     Functional mobility during ADLs: Moderate assistance;+2 for physical assistance;+2 for safety/equipment;Cueing for safety;Cueing for sequencing General ADL Comments: Pt pleasantly confused,  requires multimodal cues and assist     Vision         Perception     Praxis      Pertinent Vitals/Pain Pain  Assessment: Faces Faces Pain Scale: No hurt     Hand Dominance     Extremity/Trunk Assessment Upper Extremity Assessment Upper Extremity Assessment: Generalized weakness   Lower Extremity Assessment Lower Extremity Assessment: Defer to PT evaluation   Cervical / Trunk Assessment Cervical / Trunk Assessment: Kyphotic   Communication Communication Communication: No difficulties   Cognition Arousal/Alertness: Awake/alert Behavior During Therapy: WFL for tasks assessed/performed Overall Cognitive Status: No family/caregiver present to determine baseline cognitive functioning                                 General Comments: Pt with hx of dementia. Memory deficits noted. Pt also with difficulty sequencing when performing tasks, requiring max multimodal cues.    General Comments  VSS throughout session    Exercises     Shoulder Instructions      Home Living Family/patient expects to be discharged to:: Private residence Living Arrangements: Alone   Type of Home: House Home Access: Level entry     Home Layout: One level     Bathroom Shower/Tub: Teacher, early years/pre: Standard     Home Equipment: None   Additional Comments: Pt reporting conflicting information, so unsure of accuracy. Pt initially reports he lives alone, however, then reports he lives with his wife.       Prior Functioning/Environment Level of Independence: Independent        Comments: Pt reports he is independent with ambulation and can perform ADLs. Unsure of accuracy given cognitive deficits.         OT Problem List: Impaired balance (sitting and/or standing);Decreased cognition;Decreased safety awareness      OT Treatment/Interventions: Self-care/ADL training;Therapeutic activities;Cognitive remediation/compensation;Patient/family education;Balance training    OT Goals(Current goals can be found in the care plan section) Acute Rehab OT Goals Patient Stated  Goal: eat breakfast OT Goal Formulation: With patient Time For Goal Achievement: 08/28/20 Potential to Achieve Goals: Good ADL Goals Pt Will Perform Eating: with set-up;sitting Pt Will Perform Grooming: with supervision;sitting Pt Will Perform Upper Body Dressing: with supervision;sitting Pt Will Perform Lower Body Dressing: with min assist;sit to/from stand Pt Will Transfer to Toilet: with min assist;ambulating Pt Will Perform Toileting - Clothing Manipulation and hygiene: with mod assist;sit to/from stand  OT Frequency: Min 2X/week   Barriers to D/C:    unsure of home situation due to no family present and Pt unreliable historian - with dementia it is preferrable that Pt return to familiar environment, but family will need to be able to provide heavy lift assist and 24/7 supervision currently       Co-evaluation PT/OT/SLP Co-Evaluation/Treatment: Yes Reason for Co-Treatment: Complexity of the patient's impairments (multi-system involvement);Necessary to address cognition/behavior during functional activity;To address functional/ADL transfers;For patient/therapist safety PT goals addressed during session: Mobility/safety with mobility;Balance;Strengthening/ROM OT goals addressed during session: ADL's and self-care;Strengthening/ROM      AM-PAC OT "6 Clicks" Daily Activity     Outcome Measure Help from another person eating meals?: A Lot Help from another person taking care of personal grooming?: A Lot Help from another person toileting, which includes using toliet, bedpan, or urinal?: A Lot Help from another person bathing (including washing, rinsing, drying)?: A Lot Help from another person to put on  and taking off regular upper body clothing?: A Lot Help from another person to put on and taking off regular lower body clothing?: A Lot 6 Click Score: 12   End of Session Equipment Utilized During Treatment: Gait belt Nurse Communication: Mobility status  Activity Tolerance:  Patient tolerated treatment well Patient left: in bed;with call bell/phone within reach;Other (comment) (rails of ED stretcher back up)  OT Visit Diagnosis: Unsteadiness on feet (R26.81);Other abnormalities of gait and mobility (R26.89);History of falling (Z91.81);Muscle weakness (generalized) (M62.81);Other symptoms and signs involving cognitive function                Time: 1013-1036 OT Time Calculation (min): 23 min Charges:  OT General Charges $OT Visit: 1 Visit OT Evaluation $OT Eval Moderate Complexity: [place] OTR/L Acute Rehabilitation Services Pager: [phone] Office: [place] 08/14/2020, 2:17 PM

## 2020-08-14 NOTE — ED Notes (Addendum)
Pt able to stand with assistance beside of bed, however, pt is unable to ambulate.

## 2020-08-14 NOTE — ED Notes (Signed)
Lunch Tray Ordered @ 1036. 

## 2020-08-14 NOTE — ED Notes (Signed)
Family contact information: Son: Terris Germano- [phone]  Daughter: [name] [phone]

## 2020-08-14 NOTE — ED Notes (Signed)
RN notified about vitals 

## 2020-08-14 NOTE — H&P (Signed)
History and Physical    Derek [name]:[id number] DOB: 06-11-1927 DOA: 08/13/2020  Referring MD/NP/PA: Gean Birchwood, MD PCP: Seward Carol, MD  Patient coming from: Home  Chief Complaint: Syncope  I have personally briefly reviewed patient's old medical records in [place]   HPI: Derek [name] is a 86 y.o. male with medical history significant of paroxysmal atrial fibrillation on Eliquis, coronary artery disease, moderate aortic stenosis, hypertension, hyperlipidemia, chronic kidney disease stage IIIa, gastroesophageal reflux disease, and dementia who presents after having a syncopal event at home last night around 10:30 PM.  Patient is unable to provide any information regarding the event.  His son gives additional history over the phone.  At baseline patient has dementia, but is normally able to carry on a conversation and knows where he is.  Apparently, last night patient had started babbling not in [place] and without proper sentence structure which was unusual.  Son noticed that he was eating with his fingers, and then subsequently slumped over in the chair.  He never fell or suffered any trauma to his head.  Patient has passed out before in the past, and normally when his son lays him down on the floor like he did last night he comes back to within 5 minutes or so.  Last night the patient did not come back to like normal and he eventually called EMS.  Previous syncopal events have happened after the patient has had a big meal or when exerting himself.  He had just recently been hospitalized in October after suffering a fall thought to be secondary to syncope.  At that time they had and initially discontinued his Imdur, amlodipine, and temporarily metoprolol, but had to be resumed due to patient going into A. fib with RVR.  He had been initially discharged to a rehab facility and stayed 21 days, but his son brought him home that he thought he was able to care for him better.   At home patient's blood pressures and heart rates have been fairly stable.   ED Course: Upon admission into the emergency department patient was seen to be afebrile, pulse 50-64, blood pressure is 120/56-198/88, and all other vital signs maintained.  CT imaging of the brain showed no acute abnormalities.  Labs are significant for creatinine 1.35, BUN 21, BNP 265.3, and troponins negative x2.  Chest x-ray showed no acute abnormalities.  COVID-19 and influenza screen were both negative.  Patient was given 500 mL fluid bolus.  TRH called to admit.   Review of Systems  Unable to perform ROS: Mental status change    Past Medical History:  Diagnosis Date  . Broken shoulder   . Cerebrovascular disease    MRI/MRA in 2007 - 50% stenosis of supraclinoid ICA, left  . Clavicle fracture   . Closed wedge compression fracture of T1 vertebra ([place]) 02/20/2018  . Complex sleep apnea syndrome    adapt SV titration EEP 6 min 6 and max 15 cm water.   . Compression fracture   . Coronary atherosclerosis of native coronary artery 07/12/2013   a. NSTEMI in setting of AF with RVR 11/14 >> BMS to CFX;  b. Nuclear (9/15):  High risk - apical lateral ischemia >> c. LHC (9/15):  pLAD 50%, D1 (smaller br 90%/larger br 80%, pDx 50-60%, dLAD 50%, prox-mid CFX stent ok, pOM1 and OM2 50-70% (jailed by stent), OM5 70-80%, mPDA 90% (CFX dsz unchanged from 2014), RCA diff dsz, EF 55%, mild ant-apical HK >>  Med Rx  . CVA (cerebral vascular accident) ([place])   . Dementia ([place])   . History of pneumonia   . Hx of echocardiogram    a. Echo 11/14): EF 55-60%, no RWMA, grade 2 DD, mild aortic stenosis (mean 15 mmHg), mild LAE  . Hypertension   . Lung nodule < 6cm on CT 02/20/2018  . MI (myocardial infarction) ([place]) 07/2013  . PAF (paroxysmal atrial fibrillation) (HCC)    hx of NSTEMI in setting of AF with RVR 07/2013;  Eliquis for AC  . Shingles    left chest  . Syncope   . TIA (transient ischemic attack) 07/12/2013    Past  Surgical History:  Procedure Laterality Date  . CATARACT EXTRACTION Bilateral   . KYPHOPLASTY    . LEFT HEART CATHETERIZATION WITH CORONARY ANGIOGRAM N/A 08/08/2013   Procedure: LEFT HEART CATHETERIZATION WITH CORONARY ANGIOGRAM;  Surgeon: Jettie Booze, MD;  Location[facility] C[facility] LAB;  Service: Cardiovascular;  Laterality: N/A;  . LEFT HEART CATHETERIZATION WITH CORONARY ANGIOGRAM N/A 05/29/2014   Procedure: LEFT HEART CATHETERIZATION WITH CORONARY ANGIOGRAM;  Surgeon: Sinclair Grooms, MD;  Location[facility] CATH LAB;  Service: Cardiovascular;  Laterality: N/A;  . PERCUTANEOUS STENT INTERVENTION  08/08/2013   Procedure: PERCUTANEOUS STENT INTERVENTION;  Surgeon: Jettie Booze, MD;  Location: [facility] CATH LAB;  Service: Cardiovascular;;  . SHOULDER SURGERY Left   . TONSILLECTOMY       reports that he has never smoked. He has never used smokeless tobacco. He reports current alcohol use of about 1.0 standard drink of alcohol per week. He reports that he does not use drugs.  Allergies  Allergen Reactions  . Augmentin [Amoxicillin-Pot Clavulanate] Rash  . Keppra [Levetiracetam]     Trouble with walking, fatigued  . Penicillins Rash    Has patient had a PCN reaction causing immediate rash, facial/tongue/throat swelling, SOB or lightheadedness with hypotension: Yes Has patient had a PCN reaction causing severe rash involving mucus membranes or skin necrosis: Unk Has patient had a PCN reaction that required hospitalization: Yes Has patient had a PCN reaction occurring within the last 10 years: No If all of the above answers are "NO", then may proceed with Cephalosporin use.   . Sulfa Antibiotics Rash    Family History  Problem Relation Age of Onset  . Diabetes Mother   . Diabetes Father   . Cancer - Lung Brother   . Gait disorder Brother   . Cancer Daughter   . Stroke Sister   . Heart attack Neg Hx     Prior to Admission medications   Medication Sig Start Date End Date Taking?  Authorizing Provider  atorvastatin (LIPITOR) 40 MG tablet TAKE 1 TABLET ONCE DAILY AT 6 PM. Patient taking differently: Take 40 mg by mouth daily. TAKE 1 TABLET ONCE DAILY AT 6 PM. 05/28/20   Imogene Burn, PA-C  cholecalciferol (VITAMIN D) 1000 UNITS tablet Take 1,000 Units by mouth daily.    [provider]  donepezil (ARICEPT) 10 MG tablet Take 1 tablet (10 mg total) by mouth at bedtime. 12/28/19   Suzzanne Cloud, NP  ELIQUIS 2.5 MG TABS tablet TAKE 1 TABLET BY MOUTH TWICE DAILY. 05/28/20   Belva Crome, MD  metoprolol tartrate (LOPRESSOR) 25 MG tablet Take 1 tablet (25 mg total) by mouth 2 (two) times daily. 07/25/20   Belva Crome, MD  Multiple Vitamins-Minerals (CENTRUM SILVER PO) Take 1 tablet by mouth daily.     [provider]  Multiple Vitamins-Minerals (PRESERVISION AREDS 2+MULTI VIT) CAPS Take 1 capsule by mouth in the morning and at bedtime.    [provider]  nitroGLYCERIN (NITROSTAT) 0.4 MG SL tablet Place 1 tablet (0.4 mg total) under the tongue every 5 (five) minutes as needed for chest pain. 04/06/20   Belva Crome, MD  omeprazole (PRILOSEC) 20 MG capsule Take 20 mg by mouth daily before breakfast.     [provider]    Physical Exam:  Constitutional: Elderly male who appears to be in no acute distress Vitals:   08/14/20 1500 08/14/20 1604 08/14/20 1634 08/14/20 1635  BP: 100/79 (!) 165/61 (!) 150/67 (!) 150/67  Pulse: (!) 58 (!) 57 61   Resp: 17 16 16    Temp:  97.9 F (36.6 C) 97.9 F (36.6 C)   TempSrc:  Oral Oral   SpO2: 98% 97%    Weight:    74.9 kg  Height:    5\' 9"  (1.753 m)   Eyes: PERRL, lids and conjunctivae normal ENMT: Mucous membranes are dry. Posterior pharynx clear of any exudate or lesions.  Neck: normal, supple, no masses, no thyromegaly Respiratory: clear to auscultation bilaterally, no wheezing, no crackles. Normal respiratory effort. No accessory muscle use.  Cardiovascular: Right, no murmurs / rubs /  gallops. No extremity edema. 2+ pedal pulses. No carotid bruits.  Abdomen: no tenderness, no masses palpated. No hepatosplenomegaly. Bowel sounds positive.  Musculoskeletal: no clubbing / cyanosis. No joint deformity upper and lower extremities. Good ROM, no contractures. Normal muscle tone.  Skin: no rashes, lesions, ulcers. No induration Neurologic: CN 2-12 grossly intact. Sensation intact, DTR normal. Strength 5/5 in all 4.  Psychiatric: Confused only alert and oriented to self.  Patient states that he is currently at home and the year is [place] on Admission: I have personally reviewed following labs and imaging studies  CBC: Recent Labs  Lab 08/14/20 0027  WBC 10.0  NEUTROABS 7.4  HGB 14.9  HCT 45.5  MCV 92.3  PLT 093   Basic Metabolic Panel: Recent Labs  Lab 08/14/20 0027  NA 137  K 4.2  CL 101  CO2 25  GLUCOSE 110*  BUN 21  CREATININE 1.35*  CALCIUM 9.5   GFR: Estimated Creatinine Clearance: 35.6 mL/min (A) (by C-G formula based on SCr of 1.35 mg/dL (H)). Liver Function Tests: Recent Labs  Lab 08/14/20 0027  AST 25  ALT 18  ALKPHOS 118  BILITOT 1.0  PROT 6.7  ALBUMIN 3.5   No results for input(s): LIPASE, AMYLASE in the last 168 hours. No results for input(s): AMMONIA in the last 168 hours. Coagulation Profile: No results for input(s): INR, PROTIME in the last 168 hours. Cardiac Enzymes: No results for input(s): CKTOTAL, CKMB, CKMBINDEX, TROPONINI in the last 168 hours. BNP (last 3 results) No results for input(s): PROBNP in the last 8760 hours. HbA1C: No results for input(s): HGBA1C in the last 72 hours. CBG: No results for input(s): GLUCAP in the last 168 hours. Lipid Profile: No results for input(s): CHOL, HDL, LDLCALC, TRIG, CHOLHDL, LDLDIRECT in the last 72 hours. Thyroid Function Tests: No results for input(s): TSH, T4TOTAL, FREET4, T3FREE, THYROIDAB in the last 72 hours. Anemia Panel: No results for input(s): VITAMINB12, FOLATE,  FERRITIN, TIBC, IRON, RETICCTPCT in the last 72 hours. Urine analysis:    Component Value Date/Time   COLORURINE YELLOW 08/14/2020 0427   APPEARANCEUR HAZY (A) 08/14/2020 0427   LABSPEC 1.012 08/14/2020 0427  PHURINE 5.0 08/14/2020 0427   GLUCOSEU NEGATIVE 08/14/2020 0427   HGBUR NEGATIVE 08/14/2020 0427   BILIRUBINUR NEGATIVE 08/14/2020 0427   KETONESUR NEGATIVE 08/14/2020 0427   PROTEINUR 100 (A) 08/14/2020 0427   UROBILINOGEN 0.2 12/11/2014 1450   NITRITE NEGATIVE 08/14/2020 0427   LEUKOCYTESUR NEGATIVE 08/14/2020 0427   Sepsis Labs: Recent Results (from the past 240 hour(s))  Resp Panel by RT-PCR (Flu A&B, Covid) Urine, Catheterized     Status: None   Collection Time: 08/14/20  4:27 AM   Specimen: Urine, Catheterized; Nasopharyngeal(NP) swabs in vial transport medium  Result Value Ref Range Status   SARS Coronavirus 2 by RT PCR NEGATIVE NEGATIVE Final    Comment: (NOTE) SARS-CoV-2 target nucleic acids are NOT DETECTED.  The SARS-CoV-2 RNA is generally detectable in upper respiratory specimens during the acute phase of infection. The lowest concentration of SARS-CoV-2 viral copies this assay can detect is 138 copies/mL. A negative result does not preclude SARS-Cov-2 infection and should not be used as the sole basis for treatment or other patient management decisions. A negative result may occur with  improper specimen collection/handling, submission of specimen other than nasopharyngeal swab, presence of viral mutation(s) within the areas targeted by this assay, and inadequate number of viral copies(<138 copies/mL). A negative result must be combined with clinical observations, patient history, and epidemiological information. The expected result is Negative.  Fact Sheet for Patients:  EntrepreneurPulse.com.au  Fact Sheet for Healthcare Providers:  IncredibleEmployment.be  This test is no t yet approved or cleared by the [place] FDA and  has been authorized for detection and/or diagnosis of SARS-CoV-2 by FDA under an Emergency Use Authorization (EUA). This EUA will remain  in effect (meaning this test can be used) for the duration of the COVID-19 declaration under Section 564(b)(1) of the Act, 21 U.S.C.section 360bbb-3(b)(1), unless the authorization is terminated  or revoked sooner.       Influenza A by PCR NEGATIVE NEGATIVE Final   Influenza B by PCR NEGATIVE NEGATIVE Final    Comment: (NOTE) The Xpert Xpress SARS-CoV-2/FLU/RSV plus assay is intended as an aid in the diagnosis of influenza from Nasopharyngeal swab specimens and should not be used as a sole basis for treatment. Nasal washings and aspirates are unacceptable for Xpert Xpress SARS-CoV-2/FLU/RSV testing.  Fact Sheet for Patients: EntrepreneurPulse.com.au  Fact Sheet for Healthcare Providers: IncredibleEmployment.be  This test is not yet approved or cleared by the [place] FDA and has been authorized for detection and/or diagnosis of SARS-CoV-2 by FDA under an Emergency Use Authorization (EUA). This EUA will remain in effect (meaning this test can be used) for the duration of the COVID-19 declaration under Section 564(b)(1) of the Act, 21 U.S.C. section 360bbb-3(b)(1), unless the authorization is terminated or revoked.  Performed at [facility], [place] [address], [place], [place] [address]      Radiological Exams on Admission: CT HEAD WO CONTRAST  Result Date: 08/14/2020 CLINICAL DATA:  Mental status change EXAM: CT HEAD WITHOUT CONTRAST TECHNIQUE: Contiguous axial images were obtained from the base of the skull through the vertex without intravenous contrast. COMPARISON:  June 08, 2020 FINDINGS: Brain: No evidence of acute territorial infarction, hemorrhage, hydrocephalus,extra-axial collection or mass lesion/mass effect. There is dilatation the ventricles and sulci consistent with  age-related atrophy. Low-attenuation changes in the deep white matter consistent with small vessel ischemia. Vascular: No hyperdense vessel or unexpected calcification. Skull: The skull is intact. No fracture or focal lesion identified. Sinuses/Orbits: The visualized  paranasal sinuses are clear. There is a small amount of fluid seen within the left mastoid air cells. Other: None IMPRESSION: No acute intracranial abnormality. Findings consistent with age related atrophy and chronic small vessel ischemia Left mastoid air cell effusion. Electronically Signed   By: Prudencio Pair M.D.   On: 08/14/2020 00:37   DG Chest Port 1 View  Result Date: 08/13/2020 CLINICAL DATA:  Syncope. EXAM: PORTABLE CHEST 1 VIEW COMPARISON:  July 01, 2020 FINDINGS: Mild, diffuse, chronic appearing increased interstitial lung markings are noted. There is no evidence of acute infiltrate, pleural effusion or pneumothorax. The heart size and mediastinal contours are within normal limits. A radiopaque coronary artery stent is seen. There is marked severity calcification of the aortic arch. An intact left shoulder replacement is seen. Chronic deformities are seen involving the head and surgical neck of the right humerus. IMPRESSION: No acute or active cardiopulmonary disease. Electronically Signed   By: Virgina Norfolk M.D.   On: 08/13/2020 23:56    EKG: Independently reviewed.  Sinus bradycardia at 58 bpm with QTc 505  Assessment/Plan Syncope: Patient presents after having a syncopal event.  He had been eating dinner at the time of the episode and was witnessed by the son.  Apparently he had had similar episodes like this in the past after eating meals or up and exerting himself. During last hospitalization in October patient had been discontinued off amlodipine, metoprolol, and imdur after being evaluated by cardiology as symptoms were thought to be secondary to orthostatic hypotension.  Metoprolol had to be added back on after  patient went into A. fib. -Admit to medical telemetry -Check orthostatic vitals -PT/OT consulted -Follow-up telemetry overnight -Consider discussing with patient's cardiologist Dr. Daneen Schick in a.m. for any additional recommendations, but patient has a follow-up appointment scheduled for 12/10 in their office.  Son would like to be notified of any changes to current medication regimen as he has been more stable.  Acute encephalopathy  history of dementia: Patient son notes that normally he is able to carry on a conversation, but had began babbling yesterday which was is unusual prior to the syncopal event.  CT scan of the brain showed no acute abnormalities.  No clear signs of infection appreciated at this time -Neuro checks  Acute kidney injury superimposed on chronic kidney disease 3a: Patient's baseline creatinine appears to be around 1, but he presents with creatinine elevated to 1.35 with BUN 21.  Patient had received 500 mL fluid bolus. -Normal saline IV fluids at 50 mL/h x 1 liter -Recheck kidney function in a.m.  Moderate aortic stenosis: As seen per last echocardiogram from 06/27/2020.  Paroxysmal atrial fibrillation on chronic anticoagulation: Patient appears rate controlled at this time.  He has been taking Eliquis as advised.  Records notes that it has been discussed with cardiology in regards to his increased risk of falls and bleeding being on Eliquis. -Continue metoprolol and Eliquis  Diastolic congestive heart failure: Chronic.  Patient appears to be euvolemic at this time.  Chest x-ray otherwise clear.  His echocardiogram revealed EF of 60-65% with grade 1 diastolic dysfunction on 83/15/1761.  BNP was just mildly elevated at 265.3.  He does not appear to be fluid overloaded. -Strict intake and output -Daily weight   Prolonged QT interval: Acute.  On admission QTc 505. -Correct electrolyte abnormalities -Recheck EKG in a.m.  Essential hypertension: Patient blood  pressures noted to be elevated up to 198/88.  Home medications include metoprolol 25 mg twice  daily, but had initially missed his morning dose. -Continue metoprolol  Coronary artery disease -Continue statin  Dementia: Home medications include Aricept 10 mg nightly. -Held Aricept due to prolonged QT interval  Hyperlipidemia: Home medications include atorvastatin 40 mg daily -Continue statin  GERD: Home medications include Prilosec 20 mg daily -Held PPI due to prolonged QT interval  DVT prophylaxis: Eliquis Code Status: DNR Family Communication: Son updated over the Disposition Plan: To be determined Consults called: PT/OT  Admission status: Observation   Norval Morton MD [facility] Pager [phone]   If 7PM-7AM, please contact night-coverage [email or web address] Password [facility]  08/14/2020, 6:45 PM

## 2020-08-15 DIAGNOSIS — Z66 Do not resuscitate: Secondary | ICD-10-CM | POA: Diagnosis not present

## 2020-08-15 DIAGNOSIS — I5032 Chronic diastolic (congestive) heart failure: Secondary | ICD-10-CM | POA: Diagnosis not present

## 2020-08-15 DIAGNOSIS — I951 Orthostatic hypotension: Secondary | ICD-10-CM | POA: Diagnosis not present

## 2020-08-15 DIAGNOSIS — F0280 Dementia in other diseases classified elsewhere without behavioral disturbance: Secondary | ICD-10-CM | POA: Diagnosis not present

## 2020-08-15 DIAGNOSIS — K219 Gastro-esophageal reflux disease without esophagitis: Secondary | ICD-10-CM | POA: Diagnosis present

## 2020-08-15 DIAGNOSIS — E785 Hyperlipidemia, unspecified: Secondary | ICD-10-CM | POA: Diagnosis present

## 2020-08-15 DIAGNOSIS — Z955 Presence of coronary angioplasty implant and graft: Secondary | ICD-10-CM | POA: Diagnosis not present

## 2020-08-15 DIAGNOSIS — I480 Paroxysmal atrial fibrillation: Secondary | ICD-10-CM | POA: Diagnosis not present

## 2020-08-15 DIAGNOSIS — N1831 Chronic kidney disease, stage 3a: Secondary | ICD-10-CM | POA: Diagnosis not present

## 2020-08-15 DIAGNOSIS — G9341 Metabolic encephalopathy: Secondary | ICD-10-CM | POA: Diagnosis not present

## 2020-08-15 DIAGNOSIS — H748X2 Other specified disorders of left middle ear and mastoid: Secondary | ICD-10-CM | POA: Diagnosis not present

## 2020-08-15 DIAGNOSIS — I739 Peripheral vascular disease, unspecified: Secondary | ICD-10-CM | POA: Diagnosis not present

## 2020-08-15 DIAGNOSIS — G309 Alzheimer's disease, unspecified: Secondary | ICD-10-CM

## 2020-08-15 DIAGNOSIS — R9431 Abnormal electrocardiogram [ECG] [EKG]: Secondary | ICD-10-CM | POA: Diagnosis not present

## 2020-08-15 DIAGNOSIS — N179 Acute kidney failure, unspecified: Secondary | ICD-10-CM

## 2020-08-15 DIAGNOSIS — I350 Nonrheumatic aortic (valve) stenosis: Secondary | ICD-10-CM | POA: Diagnosis not present

## 2020-08-15 DIAGNOSIS — G9389 Other specified disorders of brain: Secondary | ICD-10-CM | POA: Diagnosis not present

## 2020-08-15 DIAGNOSIS — G4733 Obstructive sleep apnea (adult) (pediatric): Secondary | ICD-10-CM | POA: Diagnosis present

## 2020-08-15 DIAGNOSIS — I252 Old myocardial infarction: Secondary | ICD-10-CM | POA: Diagnosis not present

## 2020-08-15 DIAGNOSIS — R011 Cardiac murmur, unspecified: Secondary | ICD-10-CM | POA: Diagnosis present

## 2020-08-15 DIAGNOSIS — I130 Hypertensive heart and chronic kidney disease with heart failure and stage 1 through stage 4 chronic kidney disease, or unspecified chronic kidney disease: Secondary | ICD-10-CM | POA: Diagnosis not present

## 2020-08-15 DIAGNOSIS — R55 Syncope and collapse: Secondary | ICD-10-CM

## 2020-08-15 DIAGNOSIS — Z882 Allergy status to sulfonamides status: Secondary | ICD-10-CM | POA: Diagnosis not present

## 2020-08-15 DIAGNOSIS — Z8701 Personal history of pneumonia (recurrent): Secondary | ICD-10-CM | POA: Diagnosis not present

## 2020-08-15 DIAGNOSIS — Z8673 Personal history of transient ischemic attack (TIA), and cerebral infarction without residual deficits: Secondary | ICD-10-CM | POA: Diagnosis not present

## 2020-08-15 DIAGNOSIS — F0390 Unspecified dementia without behavioral disturbance: Secondary | ICD-10-CM | POA: Diagnosis not present

## 2020-08-15 DIAGNOSIS — Z880 Allergy status to penicillin: Secondary | ICD-10-CM | POA: Diagnosis not present

## 2020-08-15 DIAGNOSIS — Z20822 Contact with and (suspected) exposure to covid-19: Secondary | ICD-10-CM | POA: Diagnosis not present

## 2020-08-15 DIAGNOSIS — Z823 Family history of stroke: Secondary | ICD-10-CM | POA: Diagnosis not present

## 2020-08-15 DIAGNOSIS — I482 Chronic atrial fibrillation, unspecified: Secondary | ICD-10-CM | POA: Diagnosis not present

## 2020-08-15 DIAGNOSIS — I2510 Atherosclerotic heart disease of native coronary artery without angina pectoris: Secondary | ICD-10-CM | POA: Diagnosis present

## 2020-08-15 DIAGNOSIS — I6381 Other cerebral infarction due to occlusion or stenosis of small artery: Secondary | ICD-10-CM | POA: Diagnosis not present

## 2020-08-15 LAB — CBC
HCT: 37.5 % — ABNORMAL LOW (ref 39.0–52.0)
Hemoglobin: 12.8 g/dL — ABNORMAL LOW (ref 13.0–17.0)
MCH: 30.7 pg (ref 26.0–34.0)
MCHC: 34.1 g/dL (ref 30.0–36.0)
MCV: 89.9 fL (ref 80.0–100.0)
Platelets: 207 10*3/uL (ref 150–400)
RBC: 4.17 MIL/uL — ABNORMAL LOW (ref 4.22–5.81)
RDW: 13.3 % (ref 11.5–15.5)
WBC: 7.3 10*3/uL (ref 4.0–10.5)
nRBC: 0 % (ref 0.0–0.2)

## 2020-08-15 LAB — BASIC METABOLIC PANEL
Anion gap: 7 (ref 5–15)
BUN: 21 mg/dL (ref 8–23)
CO2: 25 mmol/L (ref 22–32)
Calcium: 8.9 mg/dL (ref 8.9–10.3)
Chloride: 105 mmol/L (ref 98–111)
Creatinine, Ser: 1.12 mg/dL (ref 0.61–1.24)
GFR, Estimated: >60 mL/min (ref >60)
Glucose, Bld: 76 mg/dL (ref 70–99)
Potassium: 4 mmol/L (ref 3.5–5.1)
Sodium: 137 mmol/L (ref 135–145)

## 2020-08-15 LAB — MAGNESIUM: Magnesium: 1.7 mg/dL (ref 1.7–2.4)

## 2020-08-15 NOTE — Progress Notes (Signed)
PROGRESS NOTE  Derek [name]:[id number] DOB: November 02, 1926 DOA: 08/13/2020 PCP: Seward Carol, MD  Brief History   86 year old man PMH atrial fibrillation paroxysmal on apixaban, moderate aortic stenosis, dementia presented after syncopal episode.  PMH of syncopal episodes postprandial or with exertion.  Admitted for syncope evaluation.   A & P  Syncope.  Thought in the past to be multifactorial including orthostatic hypotension and bradycardia secondary medications.  Amlodipine, metoprolol and Imdur stopped on last hospitalization as symptoms were thought to be secondary to orthostatic hypotension but restarted in the nursing home because of recurrent atrial fibrillation with rapid ventricular response. --CT head no acute abnormalities --No orthostasis with therapy evaluations today --Cardiology recommended no further work-up at this time  Acute kidney injury superimposed on CKD stage IIIa --Resolved  Moderate aortic stenosis --Follow-up with cardiology as an outpatient  Paroxysmal atrial fibrillation --On apixaban at home, consider risk/benefit, defer to cardiology at this point  Prolonged QT --Repeat EKG in a.m.  Dementia --Stable  Disposition Plan:  Discussion: Continue to monitor as per cardiology, most likely home tomorrow  Dispo: The patient is from: Home              Anticipated d/c is to: Home              Anticipated d/c date is: 1 day              Patient currently is not medically stable to d/c.  DVT prophylaxis: apixaban (ELIQUIS) tablet 2.5 mg Start: 08/14/20 1600 apixaban (ELIQUIS) tablet 2.5 mg    Code Status: DNR Family Communication:   Murray Hodgkins, MD  [facility] Direct contact: see [email or web address] (further directions at bottom of note if needed) 7PM-7AM contact night coverage as at bottom of note 08/15/2020, 3:56 PM  LOS: 0 days   Significant Hospital Events   .    Consults:  .    Procedures:  .   Significant Diagnostic Tests:   Marland Kitchen    Micro Data:  .    Antimicrobials:  .   Interval History/Subjective  CC: f/u passed out  Feels fine, no complaints  Objective   Vitals:  Vitals:   08/15/20 0853 08/15/20 1237  BP: (!) 159/74 (!) 159/72  Pulse: (!) 56   Resp: 16   Temp: (!) 97.5 F (36.4 C) (!) 97.4 F (36.3 C)  SpO2: 98% 95%    Exam:  Constitutional:   . Appears calm and comfortable ENMT:  . grossly normal hearing  Respiratory:  . CTA bilaterally, no w/r/r.  . Respiratory effort normal.  Cardiovascular:  . RRR, no m/r/g . No LE extremity edema   Psychiatric:  . Mental status o Mood, affect appropriate  I have personally reviewed the following:   Today's Data  . CBG stable . Magnesium BMP unremarkable . CBC unremarkable  Scheduled Meds: . apixaban  2.5 mg Oral BID  . atorvastatin  40 mg Oral Daily  . metoprolol tartrate  25 mg Oral BID  . sodium chloride flush  3 mL Intravenous Q12H   Continuous Infusions:  Principal Problem:   Syncope Active Problems:   Dementia (HCC)   Long term current use of anticoagulant therapy   AF (paroxysmal atrial fibrillation) (HCC)   HTN (hypertension)   HLD (hyperlipidemia)   Acute encephalopathy   Prolonged QT interval   LOS: 0 days   How to contact the [facility] Attending or Consulting provider 7A - 7P or covering provider during after hours [place],  for this patient?  1. Check the care team in [facility] and look for a) attending/consulting [facility] provider listed and b) the [facility] team listed 2. Log into [email or web address] and use [place]'s universal password to access. If you do not have the password, please contact the hospital operator. 3. Locate the [facility] provider you are looking for under [facility] and page to a number that you can be directly reached. 4. If you still have difficulty reaching the provider, please page the [facility] (Director on Call) for the Hospitalists listed on amion for assistance.

## 2020-08-15 NOTE — Hospital Course (Addendum)
86 year old man PMH atrial fibrillation paroxysmal on apixaban, moderate aortic stenosis, dementia presented after syncopal episode.  PMH of syncopal episodes postprandial or with exertion.  Admitted for syncope evaluation.  No recurrent episodes.  Seen by cardiology with recommendation for conservative management.  Son reported significant decline in function since event, no focal deficits noted but son reported slurred speech.  Discussed with neurology, MRI brain pursued which was unremarkable.  Suspect progression of underlying dementia and chronic debility.  Evaluated by therapy recommendation for SNF but son refused and preferred to take patient home.  Home with home therapy.

## 2020-08-15 NOTE — Consult Note (Addendum)
Cardiology Consultation:   Patient ID: Derek [name] MRN: [id number]; DOB: [birth year]  Admit date: 08/13/2020 Date of Consult: 08/15/2020  Primary Care Provider: Seward Carol, MD [facility] Cardiologist: Sinclair Grooms, MD  [place] Electrophysiologist:  None    Patient Profile:   Derek [name] is a 86 y.o. male with a hx of PAF on eliquis, CAD s/p BMS to Cx (2014), moderate AS, HTN, HLD, CKD stage IIIa, GERD, and dementia who is being seen today for the evaluation of syncope at the request of Dr.  Sarajane Jews.  History of Present Illness:   Derek [name] has a history of chronic atrial fibrillation rate controlled on metoprolol and anticoagulated with eliquis. He has a history of CAD s/p BMS to LCx (2014). Last heart cath in 2015 following an abnormal nuclear stress test showed widely patent proximal LAD and Cx, including patent Cx stent. He had high grade disease in D1, fifth OM and PDA accounting for the abnormal nuc. He was continued on medical therapy.  He has OSA but not using CPAP. Last echo on 06/29/20 showed EF 60-65%, grade 1 DD, and moderate AS. He has a history of orthostatic hypotension and syncope. He completed a 48 hr holter monitor April 2019 that showed predominately sinus rhythm and PACs, no Afib or excessive bradycardia.  He was last seen by Dr. Tamala Julian 04/06/20. Dr. Tamala Julian noted at that time he has a history of syncope 1-2 times monthly not associated with other symptoms. Dr. Tamala Julian discontinued imdur at that visit in an attempt to provide more blood pressure for syncopal episodes. In the interim, norvasc has also been discontinued. Reduction or discontinuation of lopressor in the past has resulted in RVR.   He was hospitalized 07/02/20 after several syncopal episodes, felt related to orthostatic hypotension. All anti-hypertensive were discontinued. Unfortunately, he want into Afib RVR and 25 mg lopressor was added back with conversion back to sinus rhythm. He was  discharged to SNF. Lives with son. Son brought him home from SNF after 21 days - on 07/27/20.   Pt admitted 08/14/20 for babbling in non-english without sentence structure, eating with his fingers, and then slumped over in a chair. Generally, son is able to lay him on the ground and he regains consciousness within 5 min. However, pt did not recover and EMS was dispatched. CT head was negative for acute abnormalities and he was in sinus rhythm. Pt was given 500 cc fluid bolus and was admitted to the hospitalist service. Cardiology was consulted.   Level 5 Caveat. Pt is forming words and some sentences, but can't give details about his condition. He does deny pain. He is unsure why he is in the hospital.  I was able to reach his daughter Derek [name]. She stated that the patient had not had a syncopal episode while at SNF or since being home.   Past Medical History:  Diagnosis Date  . Broken shoulder   . Cerebrovascular disease    MRI/MRA in 2007 - 50% stenosis of supraclinoid ICA, left  . Clavicle fracture   . Closed wedge compression fracture of T1 vertebra ([place]) 02/20/2018  . Complex sleep apnea syndrome    adapt SV titration EEP 6 min 6 and max 15 cm water.   . Compression fracture   . Coronary atherosclerosis of native coronary artery 07/12/2013   a. NSTEMI in setting of AF with RVR 11/14 >> BMS to CFX;  b. Nuclear (9/15):  High risk - apical lateral ischemia >>  c. LHC (9/15):  pLAD 50%, D1 (smaller br 90%/larger br 80%, pDx 50-60%, dLAD 50%, prox-mid CFX stent ok, pOM1 and OM2 50-70% (jailed by stent), OM5 70-80%, mPDA 90% (CFX dsz unchanged from 2014), RCA diff dsz, EF 55%, mild ant-apical HK >>  Med Rx  . CVA (cerebral vascular accident) ([place])   . Dementia ([place])   . History of pneumonia   . Hx of echocardiogram    a. Echo 11/14): EF 55-60%, no RWMA, grade 2 DD, mild aortic stenosis (mean 15 mmHg), mild LAE  . Hypertension   . Lung nodule < 6cm on CT 02/20/2018  . MI (myocardial infarction) ([place])  07/2013  . PAF (paroxysmal atrial fibrillation) (H[facility])    hx of NSTEMI in setting of AF with RVR 07/2013;  Eliquis for AC  . Shingles    left chest  . Syncope   . TIA (transient ischemic attack) 07/12/2013    Past Surgical History:  Procedure Laterality Date  . CATARACT EXTRACTION Bilateral   . KYPHOPLASTY    . LEFT HEART CATHETERIZATION WITH CORONARY ANGIOGRAM N/A 08/08/2013   Procedure: LEFT HEART CATHETERIZATION WITH CORONARY ANGIOGRAM;  Surgeon: Jettie Booze, MD;  Location: [facility] CATH LAB;  Service: Cardiovascular;  Laterality: N/A;  . LEFT HEART CATHETERIZATION WITH CORONARY ANGIOGRAM N/A 05/29/2014   Procedure: LEFT HEART CATHETERIZATION WITH CORONARY ANGIOGRAM;  Surgeon: Sinclair Grooms, MD;  Location: [facility] CATH LAB;  Service: Cardiovascular;  Laterality: N/A;  . PERCUTANEOUS STENT INTERVENTION  08/08/2013   Procedure: PERCUTANEOUS STENT INTERVENTION;  Surgeon: Jettie Booze, MD;  Location: [facility] CATH LAB;  Service: Cardiovascular;;  . SHOULDER SURGERY Left   . TONSILLECTOMY       Home Medications:  Prior to Admission medications   Medication Sig Start Date End Date Taking? Authorizing Provider  atorvastatin (LIPITOR) 40 MG tablet TAKE 1 TABLET ONCE DAILY AT 6 PM. Patient taking differently: Take 40 mg by mouth daily.  05/28/20  Yes Imogene Burn, PA-C  cholecalciferol (VITAMIN D) 1000 UNITS tablet Take 1,000 Units by mouth daily.   Yes [provider]  donepezil (ARICEPT) 10 MG tablet Take 1 tablet (10 mg total) by mouth at bedtime. 12/28/19  Yes Suzzanne Cloud, NP  ELIQUIS 2.5 MG TABS tablet TAKE 1 TABLET BY MOUTH TWICE DAILY. Patient taking differently: Take 2.5 mg by mouth 2 (two) times daily.  05/28/20  Yes Belva Crome, MD  metoprolol tartrate (LOPRESSOR) 25 MG tablet Take 1 tablet (25 mg total) by mouth 2 (two) times daily. 07/25/20  Yes Belva Crome, MD  Multiple Vitamins-Minerals (CENTRUM SILVER PO) Take 1 tablet by mouth daily.    Yes [provider]  Multiple Vitamins-Minerals (PRESERVISION AREDS 2+MULTI VIT) CAPS Take 1 capsule by mouth in the morning and at bedtime.   Yes [provider]  nitroGLYCERIN (NITROSTAT) 0.4 MG SL tablet Place 1 tablet (0.4 mg total) under the tongue every 5 (five) minutes as needed for chest pain. Patient taking differently: Place 0.4 mg under the tongue every 5 (five) minutes as needed for chest pain (max 3 doses).  04/06/20  Yes Belva Crome, MD  omeprazole (PRILOSEC) 20 MG capsule Take 20 mg by mouth daily before breakfast.    Yes [provider]    Inpatient Medications: Scheduled Meds: . apixaban  2.5 mg Oral BID  . atorvastatin  40 mg Oral Daily  . metoprolol tartrate  25 mg Oral BID  . sodium chloride flush  3 mL Intravenous Q12H   Continuous Infusions:  PRN Meds: acetaminophen **OR** acetaminophen, albuterol  Allergies:    Allergies  Allergen Reactions  . Augmentin [Amoxicillin-Pot Clavulanate] Rash  . Keppra [Levetiracetam]     Trouble with walking, fatigued  . Penicillins Rash    Has patient had a PCN reaction causing immediate rash, facial/tongue/throat swelling, SOB or lightheadedness with hypotension: Yes Has patient had a PCN reaction causing severe rash involving mucus membranes or skin necrosis: Unk Has patient had a PCN reaction that required hospitalization: Yes Has patient had a PCN reaction occurring within the last 10 years: No If all of the above answers are "NO", then may proceed with Cephalosporin use.   . Sulfa Antibiotics Rash    Social History:   Social History   Socioeconomic History  . Marital status: Widowed    Spouse name: Not on file  . Number of children: 2  . Years of education: Engineer, maintenance (IT)  . Highest education level: Not on file  Occupational History  . Occupation: retired  Tobacco Use  . Smoking status: Never Smoker  . Smokeless tobacco: Never Used  Substance and Sexual Activity  . Alcohol use: Yes    Alcohol/week: 1.0  standard drink    Types: 1 Glasses of wine per week    Comment: occasionally  . Drug use: No  . Sexual activity: Not on file  Other Topics Concern  . Not on file  Social History Narrative   Patient is widowed [name]), has 2 children   Patient is right handed   Education level is 4 year degree.   Caffeine consumption is 2-4 cups daily   Social Determinants of Health   Financial Resource Strain:   . Difficulty of Paying Living Expenses: Not on file  Food Insecurity:   . Worried About Charity fundraiser in the Last Year: Not on file  . Ran Out of Food in the Last Year: Not on file  Transportation Needs:   . Lack of Transportation (Medical): Not on file  . Lack of Transportation (Non-Medical): Not on file  Physical Activity:   . Days of Exercise per Week: Not on file  . Minutes of Exercise per Session: Not on file  Stress:   . Feeling of Stress : Not on file  Social Connections:   . Frequency of Communication with Friends and Family: Not on file  . Frequency of Social Gatherings with Friends and Family: Not on file  . Attends Religious Services: Not on file  . Active Member of Clubs or Organizations: Not on file  . Attends Archivist Meetings: Not on file  . Marital Status: Not on file  Intimate Partner Violence:   . Fear of Current or Ex-Partner: Not on file  . Emotionally Abused: Not on file  . Physically Abused: Not on file  . Sexually Abused: Not on file    Family History:    Family History  Problem Relation Age of Onset  . Diabetes Mother   . Diabetes Father   . Cancer - Lung Brother   . Gait disorder Brother   . Cancer Daughter   . Stroke Sister   . Heart attack Neg Hx      ROS:  Please see the history of present illness.   All other ROS reviewed and negative.     Physical Exam/Data:   Vitals:   08/15/20 0416 08/15/20 0710 08/15/20 0853 08/15/20 1237  BP: (!) 141/66  (!) 159/74 (!) 159/72  Pulse: 69  (!) 56   Resp: 18 13 16    Temp: 98.3  F (36.8 C)  (!) 97.5 F (36.4 C) (!) 97.4 F (36.3 C)  TempSrc: Oral  Oral Oral  SpO2: 97%  98% 95%  Weight:      Height:        Intake/Output Summary (Last 24 hours) at 08/15/2020 1458 Last data filed at 08/15/2020 0950 Gross per 24 hour  Intake 120 ml  Output 1250 ml  Net -1130 ml   Last 3 Weights 08/14/2020 07/04/2020 07/03/2020  Weight (lbs) 165 lb 2 oz 168 lb 177 lb  Weight (kg) 74.9 kg 76.204 kg 80.287 kg     Body mass index is 24.38 kg/m.  General:  Elderly male, NAD HEENT: normal Neck: no JVD Vascular: No carotid bruits Cardiac:  normal S1, S2; RRR; 4/6 systolic murmur Lungs:  clear to auscultation bilaterally, no wheezing, rhonchi or rales  Abd: soft, nontender, no hepatomegaly  Ext: no edema Musculoskeletal:  No deformities, BUE and BLE strength normal and equal Skin: warm and dry  Neuro:  CNs 2-12 intact, no focal abnormalities noted Psych:  Normal affect   EKG:  The EKG was personally reviewed and demonstrates: sinus rhythm HR 58  Telemetry:  Telemetry was personally reviewed and demonstrates:  Sinus to sinus bradycardia in the 50s with PACs  Relevant CV Studies:  Echo 06/29/20: 1. Left ventricular ejection fraction, by estimation, is 60 to 65%. The  left ventricle has normal function. The left ventricle has no regional  wall motion abnormalities. Left ventricular diastolic parameters are  consistent with Grade I diastolic  dysfunction (impaired relaxation).  2. Right ventricular systolic function is normal. The right ventricular  size is normal.  3. The mitral valve is normal in structure. Trivial mitral valve  regurgitation. No evidence of mitral stenosis.  4. The aortic valve has an indeterminant number of cusps. There is severe  calcifcation of the aortic valve. There is severe thickening of the aortic  valve. Aortic valve regurgitation is not visualized. Moderate aortic valve  stenosis. Aortic valve area,  by VTI measures 1.33 cm. Aortic  valve mean gradient measures 26.5 mmHg.  Aortic valve Vmax measures 3.22 m/s.  5. The inferior vena cava is normal in size with greater than 50%  respiratory variability, suggesting right atrial pressure of 3 mmHg.   Laboratory Data:  High Sensitivity Troponin:   Recent Labs  Lab 08/14/20 0027 08/14/20 0200  TROPONINIHS 13 12     Chemistry Recent Labs  Lab 08/14/20 0027 08/15/20 0042  NA 137 137  K 4.2 4.0  CL 101 105  CO2 25 25  GLUCOSE 110* 76  BUN 21 21  CREATININE 1.35* 1.12  CALCIUM 9.5 8.9  GFRNONAA 50* >60  ANIONGAP 11 7    Recent Labs  Lab 08/14/20 0027  PROT 6.7  ALBUMIN 3.5  AST 25  ALT 18  ALKPHOS 118  BILITOT 1.0   Hematology Recent Labs  Lab 08/14/20 0027 08/15/20 0042  WBC 10.0 7.3  RBC 4.93 4.17*  HGB 14.9 12.8*  HCT 45.5 37.5*  MCV 92.3 89.9  MCH 30.2 30.7  MCHC 32.7 34.1  RDW 13.3 13.3  PLT 204 207   BNP Recent Labs  Lab 08/14/20 0027  BNP 265.3*    DDimer No results for input(s): DDIMER in the last 168 hours.   Radiology/Studies:  CT HEAD WO CONTRAST  Result Date: 08/14/2020 CLINICAL DATA:  Mental status change EXAM: CT  HEAD WITHOUT CONTRAST TECHNIQUE: Contiguous axial images were obtained from the base of the skull through the vertex without intravenous contrast. COMPARISON:  June 08, 2020 FINDINGS: Brain: No evidence of acute territorial infarction, hemorrhage, hydrocephalus,extra-axial collection or mass lesion/mass effect. There is dilatation the ventricles and sulci consistent with age-related atrophy. Low-attenuation changes in the deep white matter consistent with small vessel ischemia. Vascular: No hyperdense vessel or unexpected calcification. Skull: The skull is intact. No fracture or focal lesion identified. Sinuses/Orbits: The visualized paranasal sinuses are clear. There is a small amount of fluid seen within the left mastoid air cells. Other: None IMPRESSION: No acute intracranial abnormality. Findings consistent  with age related atrophy and chronic small vessel ischemia Left mastoid air cell effusion. Electronically Signed   By: Prudencio Pair M.D.   On: 08/14/2020 00:37   DG Chest Port 1 View  Result Date: 08/13/2020 CLINICAL DATA:  Syncope. EXAM: PORTABLE CHEST 1 VIEW COMPARISON:  July 01, 2020 FINDINGS: Mild, diffuse, chronic appearing increased interstitial lung markings are noted. There is no evidence of acute infiltrate, pleural effusion or pneumothorax. The heart size and mediastinal contours are within normal limits. A radiopaque coronary artery stent is seen. There is marked severity calcification of the aortic arch. An intact left shoulder replacement is seen. Chronic deformities are seen involving the head and surgical neck of the right humerus. IMPRESSION: No acute or active cardiopulmonary disease. Electronically Signed   By: Virgina Norfolk M.D.   On: 08/13/2020 23:56     Assessment and Plan:   Syncope - pt has a long-standing history of orthostatic hypotension and syncope - anti-hypertensives have been held PTA - recommend continuing lopressor 25 mg BID for Afib RVR - telemetry reviewed and did not show significant bradycardia, pauses, or high grade block - it sounds as though his syncope has improved off of antihypertensives - this was his first syncopal event since his last hospitalization - consider placing a zio patch live to evaluate if RVR, pauses, or block are associated with syncopal episodes - pt may not be a candidate for device but if RVR is causing syncope, could recommend antiarrhythmic    Paroxysmal atrial fibrillation with RVR Chronic anticoagulation - pt has a history of converting to RVR without low dose BB on board - pt's son has elected to continue anticoagulation with 2.5 mg eliquis  - heart monitor in 2019 negative for episodes of RVR - would recommend close follow up with primary cardiologist to discuss ongoing anticoagulation   Chronic diastolic heart  failure - appears euvolemic - BNP 265   Moderate AS - could be contributing, although syncopal spells have not increased in quantity prior to last echo   Hypertension - would not restart any anti-hypertensives at this time   CAD s/p LCx BMS - last heart cath with patent coronaries - continue medical therapy - pt denies chest pain - troponin x 2 negative   CKD stage IIIa - sCr improved to 1.12 - near baseline   Disposition Family is considering in-home caregivers.       For questions or updates, please contact [place] Please consult [email or web address] for contact info under    Signed, Ledora Bottcher, [place]  08/15/2020 2:58 PM    Attending Note:   The patient was seen and examined.  Agree with assessment and plan as noted above.  Changes made to the above note as needed.  Patient seen and independently examined with  Doreene Adas, PA .   We  discussed all aspects of the encounter. I agree with the assessment and plan as stated above.  1.  Syncope :   Pt returns with recurrent syncope.  He has significant dementia .  He has had a very thorough evaluation  Echo from Oct. 22, 2021 shows moderate AS  Monitor from April, 2019 shows sinus rhythm, frequent PACs, rare PVCs  On exam  Lungs are clear 2-3 / 6 systolic murmur Peripheral pulses are ok No edema  He is now off all BP lowering meds.   He spent time in a SNF after his last hospitalization and had no episodes of syncope while in the SNF  He returned home and was fine for several weeks and had a syncopal episode   At this time, I suspect that his syncope is due to his gradual and progressive decline  I think we should focus on keeping safe and comfortable.  He may need 24 hour medical  assistance / aide or even SNF .  If he can be kept safe, we can probably keep him on his Eliquis.  If we cannot keep him from falling, his Eliquis should be stopped   At this point, I don't have any other suggestions   Cont  to monitor     I have spent a total of 40 minutes with patient reviewing hospital  notes , telemetry, EKGs, labs and examining patient as well as establishing an assessment and plan that was discussed with the patient. > 50% of time was spent in direct patient care.    Thayer Headings, Brooke Bonito., MD, [facility] 08/15/2020, 4:55 PM [address]  [place] Pager [phone]
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
## 2020-08-15 NOTE — Progress Notes (Deleted)
Cardiology Office Note:    Date:  08/15/2020   ID:  OZZIE [name], DOB Nov 12, 1926, MRN [id number]  PCP:  Seward Carol, MD  Cardiologist:  Sinclair Grooms, MD   Referring MD: Seward Carol, MD   No chief complaint on file.   History of Present Illness:    Derek [name] is a 86 y.o. male with a hx of  CAD s/p BMS to CFx (2014), PAF on Eliquis, embolic CVA, OSA non complaint with CPAP, mild AS,CKDstage II-III dementia, recurrent syncope, HIGH RISK NUCLEAR STRESS TEST 2015 (high grade disease in the first diagonal, fifth OM and PDA accounting for the high risk abnormality), recuurent syncope, and orthostatic hypotension (autonomic neuropathy).  ***   Past Medical History:  Diagnosis Date  . Broken shoulder   . Cerebrovascular disease    MRI/MRA in 2007 - 50% stenosis of supraclinoid ICA, left  . Clavicle fracture   . Closed wedge compression fracture of T1 vertebra ([place]) 02/20/2018  . Complex sleep apnea syndrome    adapt SV titration EEP 6 min 6 and max 15 cm water.   . Compression fracture   . Coronary atherosclerosis of native coronary artery 07/12/2013   a. NSTEMI in setting of AF with RVR 11/14 >> BMS to CFX;  b. Nuclear (9/15):  High risk - apical lateral ischemia >> c. LHC (9/15):  pLAD 50%, D1 (smaller br 90%/larger br 80%, pDx 50-60%, dLAD 50%, prox-mid CFX stent ok, pOM1 and OM2 50-70% (jailed by stent), OM5 70-80%, mPDA 90% (CFX dsz unchanged from 2014), RCA diff dsz, EF 55%, mild ant-apical HK >>  Med Rx  . CVA (cerebral vascular accident) ([place])   . Dementia ([place])   . History of pneumonia   . Hx of echocardiogram    a. Echo 11/14): EF 55-60%, no RWMA, grade 2 DD, mild aortic stenosis (mean 15 mmHg), mild LAE  . Hypertension   . Lung nodule < 6cm on CT 02/20/2018  . MI (myocardial infarction) ([place]) 07/2013  . PAF (paroxysmal atrial fibrillation) (HCC)    hx of NSTEMI in setting of AF with RVR 07/2013;  Eliquis for AC  . Shingles    left chest  . Syncope    . TIA (transient ischemic attack) 07/12/2013    Past Surgical History:  Procedure Laterality Date  . CATARACT EXTRACTION Bilateral   . KYPHOPLASTY    . LEFT HEART CATHETERIZATION WITH CORONARY ANGIOGRAM N/A 08/08/2013   Procedure: LEFT HEART CATHETERIZATION WITH CORONARY ANGIOGRAM;  Surgeon: Jettie Booze, MD;  Location: [facility] CATH LAB;  Service: Cardiovascular;  Laterality: N/A;  . LEFT HEART CATHETERIZATION WITH CORONARY ANGIOGRAM N/A 05/29/2014   Procedure: LEFT HEART CATHETERIZATION WITH CORONARY ANGIOGRAM;  Surgeon: Sinclair Grooms, MD;  Location: [facility] CATH LAB;  Service: Cardiovascular;  Laterality: N/A;  . PERCUTANEOUS STENT INTERVENTION  08/08/2013   Procedure: PERCUTANEOUS STENT INTERVENTION;  Surgeon: Jettie Booze, MD;  Location: [facility] CATH LAB;  Service: Cardiovascular;;  . SHOULDER SURGERY Left   . TONSILLECTOMY      Current Medications: No outpatient medications have been marked as taking for the 08/17/20 encounter (Appointment) with Belva Crome, MD.     Allergies:   Augmentin [amoxicillin-pot clavulanate], Keppra [levetiracetam], Penicillins, and Sulfa antibiotics   Social History   Socioeconomic History  . Marital status: Widowed    Spouse name: Not on file  . Number of children: 2  . Years of education: Engineer, maintenance (IT)  . Highest education level: Not  on file  Occupational History  . Occupation: retired  Tobacco Use  . Smoking status: Never Smoker  . Smokeless tobacco: Never Used  Substance and Sexual Activity  . Alcohol use: Yes    Alcohol/week: 1.0 standard drink    Types: 1 Glasses of wine per week    Comment: occasionally  . Drug use: No  . Sexual activity: Not on file  Other Topics Concern  . Not on file  Social History Narrative   Patient is widowed Derek [name]), has 2 children   Patient is right handed   Education level is 4 year degree.   Caffeine consumption is 2-4 cups daily   Social Determinants of Health   Financial Resource Strain:   .  Difficulty of Paying Living Expenses: Not on file  Food Insecurity:   . Worried About Charity fundraiser in the Last Year: Not on file  . Ran Out of Food in the Last Year: Not on file  Transportation Needs:   . Lack of Transportation (Medical): Not on file  . Lack of Transportation (Non-Medical): Not on file  Physical Activity:   . Days of Exercise per Week: Not on file  . Minutes of Exercise per Session: Not on file  Stress:   . Feeling of Stress : Not on file  Social Connections:   . Frequency of Communication with Friends and Family: Not on file  . Frequency of Social Gatherings with Friends and Family: Not on file  . Attends Religious Services: Not on file  . Active Member of Clubs or Organizations: Not on file  . Attends Archivist Meetings: Not on file  . Marital Status: Not on file     Family History: The patient's family history includes Cancer in his daughter; Cancer - Lung in his brother; Diabetes in his father and mother; Gait disorder in his brother; Stroke in his sister. There is no history of Heart attack.  ROS:   Please see the history of present illness.    *** All other systems reviewed and are negative.  EKGs/Labs/Other Studies Reviewed:    The following studies were reviewed today: ***  EKG:  EKG ***  Recent Labs: 08/14/2020: ALT 18; B Natriuretic Peptide 265.3; TSH 0.947 08/15/2020: BUN 21; Creatinine, Ser 1.12; Hemoglobin 12.8; Magnesium 1.7; Platelets 207; Potassium 4.0; Sodium 137  Recent Lipid Panel    Component Value Date/Time   CHOL 100 12/27/2014 0916   TRIG 67.0 12/27/2014 0916   HDL 29.50 (L) 12/27/2014 0916   CHOLHDL 3 12/27/2014 0916   VLDL 13.4 12/27/2014 0916   LDLCALC 57 12/27/2014 0916    Physical Exam:    VS:  There were no vitals taken for this visit.    Wt Readings from Last 3 Encounters:  08/14/20 165 lb 2 oz (74.9 kg)  07/04/20 168 lb (76.2 kg)  04/06/20 168 lb 6.4 oz (76.4 kg)     GEN: ***. No acute  distress HEENT: Normal NECK: No JVD. LYMPHATICS: No lymphadenopathy CARDIAC: *** RRR without murmur, gallop, or edema. VASCULAR: *** Normal Pulses. No bruits. RESPIRATORY:  Clear to auscultation without rales, wheezing or rhonchi  ABDOMEN: Soft, non-tender, non-distended, No pulsatile mass, MUSCULOSKELETAL: No deformity  SKIN: Warm and dry NEUROLOGIC:  Alert and oriented x 3 PSYCHIATRIC:  Normal affect   ASSESSMENT:    1. Syncope and collapse   2. Orthostasis   3. AF (paroxysmal atrial fibrillation) ([place])   4. Long term current use of anticoagulant therapy  5. Coronary artery disease involving native coronary artery of native heart without angina pectoris   6. Alzheimer's dementia without behavioral disturbance, unspecified timing of dementia onset ([place])   7. Mixed hyperlipidemia   8. Primary hypertension   9. TIA (transient ischemic attack)   10. History of CVA (cerebrovascular accident)   48. Obstructive sleep apnea (adult) (pediatric)   12. Educated about COVID-19 virus infection    PLAN:    In order of problems listed above:  1. ***   Medication Adjustments/Labs and Tests Ordered: Current medicines are reviewed at length with the patient today.  Concerns regarding medicines are outlined above.  No orders of the defined types were placed in this encounter.  No orders of the defined types were placed in this encounter.   There are no Patient Instructions on file for this visit.   Signed, Sinclair Grooms, MD  08/15/2020 9:32 PM    [place]

## 2020-08-16 ENCOUNTER — Telehealth: Payer: Self-pay | Admitting: Neurology

## 2020-08-16 ENCOUNTER — Inpatient Hospital Stay (HOSPITAL_COMMUNITY): Payer: Medicare Other

## 2020-08-16 NOTE — Progress Notes (Signed)
    Talked with son and Dr. Sarajane Jews today  I agree that he can be discharged today  Cont eliquis for now.  2.5 mg BID  Ive discussed adding some salt to his diet to help prevent the orthostasis   Will cancel the appt with Dr. Tamala Julian tomorrow. Reschedule for 4 months     Mertie Moores, MD  08/16/2020 10:46 AM    [address],  [address], [place]  [address] Phone: [phone]; Fax: [phone]

## 2020-08-16 NOTE — Progress Notes (Signed)
PROGRESS NOTE  Derek [name] BTD:[id number] DOB: 1927/05/22 DOA: 08/13/2020 PCP: Seward Carol, MD  Brief History   86 year old man PMH atrial fibrillation paroxysmal on apixaban, moderate aortic stenosis, dementia presented after syncopal episode.  PMH of syncopal episodes postprandial or with exertion.  Admitted for syncope evaluation.   A & P  Syncope.  Recurrent as an outpatient, thought in the past to be multifactorial including orthostatic hypotension and bradycardia secondary medications.  Amlodipine, metoprolol and Imdur stopped on last hospitalization as symptoms were thought to be secondary to orthostatic hypotension but metoprolol restarted in the nursing home because of recurrent atrial fibrillation with rapid ventricular response. --CT head no acute abnormalities --No orthostasis with therapy evaluations 12/9 --Cardiology recommended no further work-up at this time, continue metoprolol, okay to continue apixaban for now  Reported slurred speech, generalized weakness since outpatient event --Reviewed chart in detail, including Dr. Tobey Grim notes which demonstrate a history of seizure in 2019 in the context of possible heatstroke, started on Keppra at that time but patient unable to tolerate, Dr. Tobey Grim notes report progressive worsening of dementia over time. --Discussed in detail with son, no signs or symptoms to suggest seizure activity at home.  History most suggestive of vasovagal phenomenon or postprandial hypotension.  Slurred speech not prominent on my exam. --Discussed in detail with primary neurologist Dr. Jannifer Franklin, recommended proceeding with MRI brain which was negative for acute abnormalities, no further recommendations, given no history to suggest seizure, EEG not indicated --Did well in speech therapy with recommendation for regular diet thin liquids --SNF was recommended by therapy but patient's son refused, plan for home with home health OT, PT and equipment  including 3 and 1, wheelchair, cushion, hospital bed  Acute kidney injury superimposed on CKD stage IIIa --Resolved  Moderate aortic stenosis --Follow-up with cardiology as an outpatient  Paroxysmal atrial fibrillation --On apixaban at home, consider risk/benefit, but son wishes to continue at this time  Prolonged QT --Resolved  Dementia --Stable  Disposition Plan:  Discussion: Continue to monitor, deliver equipment, likely home 12/10  Dispo: The patient is from: Home              Anticipated d/c is to: Home              Anticipated d/c date is: 1 day              Patient currently is not medically stable to d/c.  DVT prophylaxis: apixaban (ELIQUIS) tablet 2.5 mg Start: 08/14/20 1600 apixaban (ELIQUIS) tablet 2.5 mg    Code Status: DNR Family Communication: Son at bedside  Murray Hodgkins, MD  [facility] Direct contact: see [email or web address] (further directions at bottom of note if needed) 7PM-7AM contact night coverage as at bottom of note 08/16/2020, 4:32 PM  LOS: 1 day    Consults:  . Cardiology    Procedures:  .   Significant Diagnostic Tests:  Marland Kitchen    Micro Data:  .    Antimicrobials:  .   Interval History/Subjective  CC: f/u passed out  No complaints per pt, but confused Son at bedside reports slow decline in regard to his father, however after the event that precipitated this admission, his condition has worsened significantly "like a drop" and has not rebounded.  Patient still has slurred speech and son wonders if he may have had a stroke.  Objective   Vitals:  Vitals:   08/16/20 1245 08/16/20 1612  BP: (!) 154/95 (!) 165/61  Pulse: (!) 55 (!) 55  Resp: 16 16  Temp: 97.6 F (36.4 C) 98 F (36.7 C)  SpO2: 98% 98%    Exam:  Constitutional:   . Appears calm and comfortable ENMT:  . grossly normal hearing  Respiratory:  . CTA bilaterally, no w/r/r.  . Respiratory effort normal.  Cardiovascular:  . RRR, no m/r/g . No LE extremity edema    Musculoskeletal:  . RUE, LUE, RLE, LLE   Moves all extremities to command, no gross focal deficit Neurologic:  . CN appear grossly intact Psychiatric:  . Mental status o Mood, affect appropriate . Confused . Speech clear but sparse  I have personally reviewed the following:   Today's Data  . MRI brain no acute abnormality  Scheduled Meds: . apixaban  2.5 mg Oral BID  . atorvastatin  40 mg Oral Daily  . metoprolol tartrate  25 mg Oral BID  . sodium chloride flush  3 mL Intravenous Q12H   Continuous Infusions:  Principal Problem:   Syncope Active Problems:   Dementia (HCC)   Long term current use of anticoagulant therapy   AF (paroxysmal atrial fibrillation) (HCC)   HTN (hypertension)   HLD (hyperlipidemia)   AKI (acute kidney injury) ([place])   Acute encephalopathy   Prolonged QT interval   LOS: 1 day   How to contact the [facility] Attending or Consulting provider 7A - 7P or covering provider during after hours [place], for this patient?  1. Check the care team in [facility] and look for a) attending/consulting TRH provider listed and b) the [facility] team listed 2. Log into [email or web address] and use [place]'s universal password to access. If you do not have the password, please contact the hospital operator. 3. Locate the [facility] provider you are looking for under [facility] and page to a number that you can be directly reached. 4. If you still have difficulty reaching the provider, please page the [facility] (Director on Call) for the Hospitalists listed on amion for assistance.

## 2020-08-16 NOTE — Telephone Encounter (Signed)
The patient was recently admitted to the hospital with a possible syncopal event, but the patient has had some persistence of slurring of speech.  He is on anticoagulant therapy for atrial fibrillation.  I have recommended getting MRI of the brain to look for a small stroke event note may promote the initiation of antiplatelet agents.

## 2020-08-16 NOTE — Telephone Encounter (Signed)
Pt's son, Rourke Mcquitty (on [place]) called, Monday after physical therapy at 6 pm he started babbling, 7 pm got him up to eat, took him to the bathroom and he was disorientation; not able to follow directions. Sit him down started eating with his fingers. He fainted, I put him in the floor, called EMS. Zacarias Pontes ran test, could find .any sign of stroke or heart attack. His speech is still slurred and is not mobile.  Mr. Davie stated, going to ask Dr. Sarajane Jews to send [place] a referral. If you need to complete any test, can you do while he is in the hospital?

## 2020-08-16 NOTE — Discharge Instructions (Signed)

## 2020-08-16 NOTE — Plan of Care (Signed)
  Problem: Education: Goal: Knowledge of condition and prescribed therapy will improve 08/16/2020 2118 by Florestine Avers, RN Outcome: Progressing 08/16/2020 2118 by Florestine Avers, RN Outcome: Progressing   Problem: Cardiac: Goal: Will achieve and/or maintain adequate cardiac output 08/16/2020 2118 by Florestine Avers, RN Outcome: Progressing 08/16/2020 2118 by Florestine Avers, RN Outcome: Progressing   Problem: Physical Regulation: Goal: Complications related to the disease process, condition or treatment will be avoided or minimized 08/16/2020 2118 by Florestine Avers, RN Outcome: Progressing 08/16/2020 2118 by Florestine Avers, RN Outcome: Progressing   Problem: Education: Goal: Knowledge of General Education information will improve Description: Including pain rating scale, medication(s)/side effects and non-pharmacologic comfort measures Outcome: Progressing   Problem: Health Behavior/Discharge Planning: Goal: Ability to manage health-related needs will improve Outcome: Progressing   Problem: Clinical Measurements: Goal: Ability to maintain clinical measurements within normal limits will improve Outcome: Progressing Goal: Will remain free from infection Outcome: Progressing Goal: Diagnostic test results will improve Outcome: Progressing Goal: Respiratory complications will improve Outcome: Progressing Goal: Cardiovascular complication will be avoided Outcome: Progressing   Problem: Activity: Goal: Risk for activity intolerance will decrease Outcome: Progressing   Problem: Nutrition: Goal: Adequate nutrition will be maintained Outcome: Progressing   Problem: Coping: Goal: Level of anxiety will decrease Outcome: Progressing   Problem: Elimination: Goal: Will not experience complications related to bowel motility Outcome: Progressing Goal: Will not experience complications related to urinary retention Outcome: Progressing   Problem: Pain  Managment: Goal: General experience of comfort will improve Outcome: Progressing   Problem: Safety: Goal: Ability to remain free from injury will improve Outcome: Progressing   Problem: Skin Integrity: Goal: Risk for impaired skin integrity will decrease Outcome: Progressing

## 2020-08-16 NOTE — Progress Notes (Signed)
Occupational Therapy Treatment Patient Details Name: Derek [name] MRN: [id number] DOB: Jun 14, 1927 Today's Date: 08/16/2020    History of present illness Pt is a 86 y/o male admitted secondary to syncopal episode. PMH includes dementia, HTN and s/p kyphoplasty.    OT comments  OT treatment session with focus on self-care re-education, bed mobility, functional transfers, patient/family education, and d/c planning. Patient continues to require Mod A +2 grossly for functional transfers and Total A for hygiene/clothing management at bed level. Son present at bedside and notes that patient has had a sharp and significant decline in function over the last several days/weeks. Son states that he is making arrangements for a hospital bed, a PCA to assist with ADLs, and is open to lift equipment if necessary. Son states once these items have been acquired, he would like the bring the patient home. Short-term SNF placement discussed with patient's son as an option but son is not open to SNF placement at this time. OT will continue to follow acutely to maximize patient safety and independence and decrease caregiver burden.    Follow Up Recommendations  SNF;Home health OT;Supervision/Assistance - 24 hour    Equipment Recommendations  3 in 1 bedside commode;Wheelchair (measurements OT);Wheelchair cushion (measurements OT);Hospital bed;Other (comment) (Possibly lift equiptment)    Recommendations for Other Services      Precautions / Restrictions Precautions Precautions: Fall Restrictions Weight Bearing Restrictions: No       Mobility Bed Mobility Overal bed mobility: Needs Assistance Bed Mobility: Rolling;Sidelying to Sit Rolling: Mod assist Sidelying to sit: Min assist;+2 for physical assistance       General bed mobility comments: Patient able to roll R<>L with Mod A and additional assist  to maintain position during hygiene management.  Transfers Overall transfer level: Needs  assistance Equipment used: Rolling walker (2 wheeled) Transfers: Sit to/from Stand Sit to Stand: Mod assist;+2 physical assistance         General transfer comment: Mod A +2 for steadying and lift assist. Pt with posterior lean and requiring manual assist to correct posture.     Balance Overall balance assessment: Needs assistance Sitting-balance support: No upper extremity supported;Feet supported Sitting balance-Leahy Scale: Fair   Postural control: Posterior lean Standing balance support: Bilateral upper extremity supported Standing balance-Leahy Scale: Poor Standing balance comment: Reliant on BUE and external support                            ADL either performed or assessed with clinical judgement   ADL                           Toilet Transfer: Moderate assistance;+2 for physical assistance;+2 for safety/equipment   Toileting- Clothing Manipulation and Hygiene: Total assistance Toileting - Clothing Manipulation Details (indicate cue type and reason): Patient found to be incontinent of bowel requiring Total A for hygiene/clothing management.     Functional mobility during ADLs: Moderate assistance;+2 for physical assistance;+2 for safety/equipment;Cueing for safety;Cueing for sequencing General ADL Comments: Pt pleasantly confused, requires multimodal cues and assist     Vision   Additional Comments: Patient able to track therapist around the room when promted.   Perception     Praxis      Cognition Arousal/Alertness: Awake/alert Behavior During Therapy: Flat affect Overall Cognitive Status: History of cognitive impairments - at baseline  General Comments: Maximal 1-step multimodal cues to sequence functional mobility with use of RW.        Exercises     Shoulder Instructions       General Comments Son present at bedside throughout tx session noting decline in patient function from  baseline.    Pertinent Vitals/ Pain       Pain Assessment: Faces Faces Pain Scale: Hurts a little bit Pain Location: General discomfort with movement. Pain Intervention(s): Monitored during session;Repositioned  Home Living                                          Prior Functioning/Environment              Frequency  Min 2X/week        Progress Toward Goals  OT Goals(current goals can now be found in the care plan section)  Progress towards OT goals: Progressing toward goals  Acute Rehab OT Goals Patient Stated Goal: To get stronger OT Goal Formulation: With patient/family Time For Goal Achievement: 08/28/20 Potential to Achieve Goals: Fair ADL Goals Pt Will Perform Eating: with set-up;sitting Pt Will Perform Grooming: with supervision;sitting Pt Will Perform Upper Body Dressing: with supervision;sitting Pt Will Perform Lower Body Dressing: with min assist;sit to/from stand Pt Will Transfer to Toilet: with min assist;ambulating Pt Will Perform Toileting - Clothing Manipulation and hygiene: with mod assist;sit to/from stand  Plan Discharge plan remains appropriate    Co-evaluation                 AM-PAC OT "6 Clicks" Daily Activity     Outcome Measure   Help from another person eating meals?: A Lot Help from another person taking care of personal grooming?: A Lot Help from another person toileting, which includes using toliet, bedpan, or urinal?: Total Help from another person bathing (including washing, rinsing, drying)?: A Lot Help from another person to put on and taking off regular upper body clothing?: A Lot Help from another person to put on and taking off regular lower body clothing?: A Lot 6 Click Score: 11    End of Session Equipment Utilized During Treatment: Gait belt;Rolling walker  OT Visit Diagnosis: Unsteadiness on feet (R26.81);Other abnormalities of gait and mobility (R26.89);History of falling (Z91.81);Muscle  weakness (generalized) (M62.81);Other symptoms and signs involving cognitive function   Activity Tolerance Patient tolerated treatment well   Patient Left in chair;with call bell/phone within reach;with chair alarm set;with family/visitor present   Nurse Communication Mobility status        Time: 5361-4431 OT Time Calculation (min): 29 min  Charges: OT General Charges $OT Visit: 1 Visit OT Treatments $Self Care/Home Management : 23-37 mins  Jillian Pianka H. OTR/L Supplemental OT, Department of rehab services [phone]   Meleana Commerford R H. 08/16/2020, 2:47 PM

## 2020-08-16 NOTE — Evaluation (Signed)
Clinical/Bedside Swallow Evaluation Patient Details  Name: Derek [name] MRN: [id number] Date of Birth: 12-16-1926  Today's Date: 08/16/2020 Time: SLP Start Time (ACUTE ONLY): 1010 SLP Stop Time (ACUTE ONLY): 1032 SLP Time Calculation (min) (ACUTE ONLY): 22 min  Past Medical History:  Past Medical History:  Diagnosis Date  . Broken shoulder   . Cerebrovascular disease    MRI/MRA in 2007 - 50% stenosis of supraclinoid ICA, left  . Clavicle fracture   . Closed wedge compression fracture of T1 vertebra ([place]) 02/20/2018  . Complex sleep apnea syndrome    adapt SV titration EEP 6 min 6 and max 15 cm water.   . Compression fracture   . Coronary atherosclerosis of native coronary artery 07/12/2013   a. NSTEMI in setting of AF with RVR 11/14 >> BMS to CFX;  b. Nuclear (9/15):  High risk - apical lateral ischemia >> c. LHC (9/15):  pLAD 50%, D1 (smaller br 90%/larger br 80%, pDx 50-60%, dLAD 50%, prox-mid CFX stent ok, pOM1 and OM2 50-70% (jailed by stent), OM5 70-80%, mPDA 90% (CFX dsz unchanged from 2014), RCA diff dsz, EF 55%, mild ant-apical HK >>  Med Rx  . CVA (cerebral vascular accident) ([place])   . Dementia ([place])   . History of pneumonia   . Hx of echocardiogram    a. Echo 11/14): EF 55-60%, no RWMA, grade 2 DD, mild aortic stenosis (mean 15 mmHg), mild LAE  . Hypertension   . Lung nodule < 6cm on CT 02/20/2018  . MI (myocardial infarction) ([name]) 07/2013  . PAF (paroxysmal atrial fibrillation) ([facility])    hx of NSTEMI in setting of AF with RVR 07/2013;  Eliquis for AC  . Shingles    left chest  . Syncope   . TIA (transient ischemic attack) 07/12/2013   Past Surgical History:  Past Surgical History:  Procedure Laterality Date  . CATARACT EXTRACTION Bilateral   . KYPHOPLASTY    . LEFT HEART CATHETERIZATION WITH CORONARY ANGIOGRAM N/A 08/08/2013   Procedure: LEFT HEART CATHETERIZATION WITH CORONARY ANGIOGRAM;  Surgeon: Jettie Booze, MD;  Location: [facility] CATH LAB;  Service:  Cardiovascular;  Laterality: N/A;  . LEFT HEART CATHETERIZATION WITH CORONARY ANGIOGRAM N/A 05/29/2014   Procedure: LEFT HEART CATHETERIZATION WITH CORONARY ANGIOGRAM;  Surgeon: Sinclair Grooms, MD;  Location: [facility] CATH LAB;  Service: Cardiovascular;  Laterality: N/A;  . PERCUTANEOUS STENT INTERVENTION  08/08/2013   Procedure: PERCUTANEOUS STENT INTERVENTION;  Surgeon: Jettie Booze, MD;  Location: [facility] CATH LAB;  Service: Cardiovascular;;  . SHOULDER SURGERY Left   . TONSILLECTOMY     HPI:  86 y.o. male with medical history significant of paroxysmal atrial fibrillation on Eliquis, coronary artery disease, moderate aortic stenosis, hypertension, hyperlipidemia, chronic kidney disease stage IIIa, gastroesophageal reflux disease, and dementia who presents after having a syncopal event at home last night around 10:30 PM.  At baseline patient has dementia, but is normally able to carry on a conversation and knows where he is per son's report on 08/13/20.  He had just recently been hospitalized in October after suffering a fall thought to be secondary to syncope;  08/13/20 CXR indicated no acute or active disease; CT head on 08/13/20 noted no acute abnormality.  BSE generated d/t potential dysphagia precipitated by decreased mentation.  Assessment / Plan / Recommendation Clinical Impression  Cognitive-based dysphagia characterized by generalized oral weakness with decreased/prolonged oral manipulation, delay in the initiation of the swallow and min verbal cues required to swallow d/t  decreased sustained attention.  Pt exhibited one incidence of delayed cough after larger volume of liquid, but this was remediated by administering assisted smaller sips during PO consumption.  Initiate a regular/thin liquid diet with swallowing precautions in place for aspiration/swallowing safety; ST will f/u briefly for diet tolerance/education re: cognitive-based dysphagia/implications.  Thank you for this consult.  SLP  Visit Diagnosis: Dysphagia, oropharyngeal phase (R13.12)    Aspiration Risk  Mild aspiration risk    Diet Recommendation Regular;Thin liquid   Liquid Administration via: Cup;No straw Medication Administration: Whole meds with puree Supervision: Patient able to self feed;Staff to assist with self feeding;Intermittent supervision to cue for compensatory strategies Compensations: Slow rate;Small sips/bites;Minimize environmental distractions Postural Changes: Seated upright at 90 degrees    Other  Recommendations Oral Care Recommendations: Oral care BID   Follow up Recommendations 24 hour supervision/assistance      Frequency and Duration min 1 x/week  1 week       Prognosis Prognosis for Safe Diet Advancement: Good Barriers to Reach Goals: Cognitive deficits      Swallow Study   General Date of Onset: 08/13/20 HPI: 86 y.o. male with medical history significant of paroxysmal atrial fibrillation on Eliquis, coronary artery disease, moderate aortic stenosis, hypertension, hyperlipidemia, chronic kidney disease stage IIIa, gastroesophageal reflux disease, and dementia who presents after having a syncopal event at home last night around 10:30 PM.  Patient is unable to provide any information regarding the event.  His son gives additional history over the phone.  At baseline patient has dementia, but is normally able to carry on a conversation and knows where he is.  Apparently, last night patient had started babbling not in [place] and without proper sentence structure which was unusual.  Son noticed that he was eating with his fingers, and then subsequently slumped over in the chair.  He never fell or suffered any trauma to his head.  Patient has passed out before in the past, and normally when his son lays him down on the floor like he did last night he comes back to within 5 minutes or so.  Last night the patient did not come back to like normal and he eventually called EMS.  Previous  syncopal events have happened after the patient has had a big meal or when exerting himself.  He had just recently been hospitalized in October after suffering a fall thought to be secondary to syncope. Type of Study: Bedside Swallow Evaluation Previous Swallow Assessment: n/a Diet Prior to this Study: NPO Temperature Spikes Noted: No Respiratory Status: Room air History of Recent Intubation: No Behavior/Cognition: Alert;Confused;Requires cueing Oral Cavity Assessment: Within Functional Limits Oral Care Completed by SLP: Yes Oral Cavity - Dentition: Adequate natural dentition Vision: Functional for self-feeding Self-Feeding Abilities: Needs assist Patient Positioning: Upright in bed Baseline Vocal Quality: Low vocal intensity Volitional Cough: Weak Volitional Swallow: Able to elicit    Oral/Motor/Sensory Function Overall Oral Motor/Sensory Function: Generalized oral weakness   Ice Chips Ice chips: Impaired Presentation: Spoon Oral Phase Functional Implications: Prolonged oral transit Pharyngeal Phase Impairments: Suspected delayed Swallow   Thin Liquid Thin Liquid: Impaired Presentation: Cup;Straw;Spoon Oral Phase Functional Implications: Prolonged oral transit Pharyngeal  Phase Impairments: Suspected delayed Swallow;Cough - Immediate (cough noted with larger volume of liquids)    Nectar Thick Nectar Thick Liquid: Not tested   Honey Thick Honey Thick Liquid: Not tested   Puree Puree: Impaired Presentation: Spoon Oral Phase Impairments: Reduced lingual movement/coordination Oral Phase Functional Implications: Prolonged oral transit Pharyngeal  Phase Impairments: Suspected delayed Swallow   Solid     Solid: Impaired Presentation: Self Fed Oral Phase Impairments: Impaired mastication Oral Phase Functional Implications: Prolonged oral transit Pharyngeal Phase Impairments: Suspected delayed Swallow      Elvina Sidle, M.S., CCC-SLP 08/16/2020,10:56 AM

## 2020-08-16 NOTE — Telephone Encounter (Signed)
I LMVM for son, that it would be the hospitalist in [facility] to address orders, and tests for his father.  We do not have privileges relating to ordering tests.  Would let SS/NP know.

## 2020-08-17 ENCOUNTER — Ambulatory Visit: Payer: Medicare Other | Admitting: Interventional Cardiology

## 2020-08-17 ENCOUNTER — Telehealth: Payer: Self-pay | Admitting: Interventional Cardiology

## 2020-08-17 MED ORDER — NYSTATIN 100000 UNIT/ML MT SUSP: 5.0000 mL | Freq: Four times a day (QID) | OROMUCOSAL | 0 refills | Status: DC

## 2020-08-17 NOTE — Discharge Summary (Signed)
Physician Discharge Summary  Derek [name] HBZ:[id number] DOB: 1927/06/25 DOA: 08/13/2020  PCP: Seward Carol, MD  Admit date: 08/13/2020 Discharge date: 08/17/2020  Recommendations for Outpatient Follow-up:  1. Follow-up progressive dementia, generalized weakness   Follow-up Information    Polite, Jori Moll, MD. Schedule an appointment as soon as possible for a visit in 2 week(s).   Specialty: Internal Medicine Contact information: [address] [place] [address] [phone]        Belva Crome, MD .   Specialty: Cardiology Contact information: [id number] [place] [address] [phone]                Discharge Diagnoses: Principal diagnosis is #1 Principal Problem:   Syncope Active Problems:   Dementia ([place])   Long term current use of anticoagulant therapy   AF (paroxysmal atrial fibrillation) (H[facility])   HTN (hypertension)   HLD (hyperlipidemia)   AKI (acute kidney injury) ([place])   Acute encephalopathy   Prolonged QT interval   Discharge Condition: improved Disposition: home w/ HHPT, OT  Diet recommendation:  Diet Orders (From admission, onward)    Start     Ordered   08/16/20 1044  Diet regular Room service appropriate? Yes; Fluid consistency: Thin  Diet effective now       Question Answer Comment  Room service appropriate? Yes   Fluid consistency: Thin      08/16/20 1043           Filed Weights   08/14/20 1635  Weight: 74.9 kg    HPI/Hospital Course:   86 year old man PMH atrial fibrillation paroxysmal on apixaban, moderate aortic stenosis, dementia presented after syncopal episode.  PMH of syncopal episodes postprandial or with exertion.  Admitted for syncope evaluation.  No recurrent episodes.  Seen by cardiology with recommendation for conservative management.  Son reported significant decline in function since event, no focal deficits noted but son reported slurred speech.  Discussed with  neurology, MRI brain pursued which was unremarkable.  Suspect progression of underlying dementia and chronic debility.  Evaluated by therapy recommendation for SNF but son refused and preferred to take patient home.  Home with home therapy.  Syncope.  Recurrent as an outpatient, thought in the past to be multifactorial including orthostatic hypotension and bradycardia secondary medications.  Amlodipine, metoprolol and Imdur stopped on last hospitalization as symptoms were thought to be secondary to orthostatic hypotension but metoprolol restarted in the nursing home because of recurrent atrial fibrillation with rapid ventricular response. --CT head no acute abnormalities, MRI brain unremarkable --No orthostasis with therapy evaluations 12/9 --Cardiology recommended no further work-up at this time, continue metoprolol, okay to continue apixaban for now --No arrhythmias noted.  Would recommend stopping donepezil.  Reported slurred speech, generalized weakness since outpatient event --Reviewed chart in detail, including Dr. Tobey Grim notes which demonstrate a history of seizure in 2019 in the context of possible heatstroke, started on Keppra at that time but patient unable to tolerate, Dr. Tobey Grim notes report progressive worsening of dementia over time. --Discussed in detail with son, no signs or symptoms to suggest seizure activity at home.  History most suggestive of vasovagal phenomenon or postprandial hypotension.  Slurred speech not prominent on my exam. --Discussed in detail with primary neurologist Dr. Jannifer Franklin, recommended proceeding with MRI brain which was negative for acute abnormalities, no further recommendations, given no history to suggest seizure, EEG not indicated --Did well in speech therapy with recommendation for regular diet thin liquids --SNF  was recommended by therapy but patient's son refused, plan for home with home health OT, PT and equipment including 3 and 1, wheelchair,  cushion, hospital bed  Acute kidney injury superimposed on CKD stage IIIa --Resolved  Moderate aortic stenosis --Follow-up with cardiology as an outpatient  Paroxysmal atrial fibrillation --On apixaban at home, consider risk/benefit, but son wishes to continue at this time  Prolonged QT --Resolved  Dementia --Stable  Thrush --oral nystatin  Consults:  . Neurology   Today's assessment: S: CC: f/u syncope Feels ok  O: Vitals:  Vitals:   08/17/20 0410 08/17/20 0730  BP: (!) 177/80 (!) 180/79  Pulse: (!) 53 60  Resp: 16   Temp: 97.8 F (36.6 C) 98.3 F (36.8 C)  SpO2: 96% 99%    Constitutional:  . Appears calm and comfortable ENMT:  . grossly normal hearing  . White exudate on tongue Respiratory:  . CTA bilaterally, no w/r/r.  . Respiratory effort normal.  Cardiovascular:  . RRR, no m/r/g . No LE extremity edema   Abdomen:  . soft Musculoskeletal:  . RUE, LUE, RLE, LLE   Moves all extremities to command Lifts both legs off bed Psychiatric:  . Mental status o Mood, affect appropriate o Confused  MRI brain no acute abnormalities.  Discharge Instructions  Discharge Instructions    Discharge instructions   Complete by: As directed    Call your physician or seek immediate medical attention for weakness, pain, worsening confusion or worsening of condition.   Increase activity slowly   Complete by: As directed      Allergies as of 08/17/2020      Reactions   Augmentin [amoxicillin-pot Clavulanate] Rash   Keppra [levetiracetam]    Trouble with walking, fatigued   Penicillins Rash   Has patient had a PCN reaction causing immediate rash, facial/tongue/throat swelling, SOB or lightheadedness with hypotension: Yes Has patient had a PCN reaction causing severe rash involving mucus membranes or skin necrosis: Unk Has patient had a PCN reaction that required hospitalization: Yes Has patient had a PCN reaction occurring within the last 10 years:  No If all of the above answers are "NO", then may proceed with Cephalosporin use.   Sulfa Antibiotics Rash      Medication List    TAKE these medications   atorvastatin 40 MG tablet Commonly known as: LIPITOR TAKE 1 TABLET ONCE DAILY AT 6 PM. What changed: See the new instructions.   PreserVision AREDS 2+Multi Vit Caps Take 1 capsule by mouth in the morning and at bedtime.   CENTRUM SILVER PO Take 1 tablet by mouth daily.   cholecalciferol 1000 units tablet Commonly known as: VITAMIN D Take 1,000 Units by mouth daily.   donepezil 10 MG tablet Commonly known as: ARICEPT Take 1 tablet (10 mg total) by mouth at bedtime.   Eliquis 2.5 MG Tabs tablet Generic drug: apixaban TAKE 1 TABLET BY MOUTH TWICE DAILY. What changed: how much to take   metoprolol tartrate 25 MG tablet Commonly known as: LOPRESSOR Take 1 tablet (25 mg total) by mouth 2 (two) times daily.   nitroGLYCERIN 0.4 MG SL tablet Commonly known as: Nitrostat Place 1 tablet (0.4 mg total) under the tongue every 5 (five) minutes as needed for chest pain. What changed: reasons to take this   nystatin 100000 UNIT/ML suspension Commonly known as: MYCOSTATIN Take 5 mLs (500,000 Units total) by mouth 4 (four) times daily.   omeprazole 20 MG capsule Commonly known as: PRILOSEC Take 20 mg  by mouth daily before breakfast.            Durable Medical Equipment  (From admission, onward)         Start     Ordered   08/16/20 1630  For home use only DME 3 n 1  Once        08/16/20 1630         Allergies  Allergen Reactions  . Augmentin [Amoxicillin-Pot Clavulanate] Rash  . Keppra [Levetiracetam]     Trouble with walking, fatigued  . Penicillins Rash    Has patient had a PCN reaction causing immediate rash, facial/tongue/throat swelling, SOB or lightheadedness with hypotension: Yes Has patient had a PCN reaction causing severe rash involving mucus membranes or skin necrosis: Unk Has patient had a PCN  reaction that required hospitalization: Yes Has patient had a PCN reaction occurring within the last 10 years: No If all of the above answers are "NO", then may proceed with Cephalosporin use.   . Sulfa Antibiotics Rash    The results of significant diagnostics from this hospitalization (including imaging, microbiology, ancillary and laboratory) are listed below for reference.    Significant Diagnostic Studies: CT HEAD WO CONTRAST  Result Date: 08/14/2020 CLINICAL DATA:  Mental status change EXAM: CT HEAD WITHOUT CONTRAST TECHNIQUE: Contiguous axial images were obtained from the base of the skull through the vertex without intravenous contrast. COMPARISON:  June 08, 2020 FINDINGS: Brain: No evidence of acute territorial infarction, hemorrhage, hydrocephalus,extra-axial collection or mass lesion/mass effect. There is dilatation the ventricles and sulci consistent with age-related atrophy. Low-attenuation changes in the deep white matter consistent with small vessel ischemia. Vascular: No hyperdense vessel or unexpected calcification. Skull: The skull is intact. No fracture or focal lesion identified. Sinuses/Orbits: The visualized paranasal sinuses are clear. There is a small amount of fluid seen within the left mastoid air cells. Other: None IMPRESSION: No acute intracranial abnormality. Findings consistent with age related atrophy and chronic small vessel ischemia Left mastoid air cell effusion. Electronically Signed   By: Prudencio Pair M.D.   On: 08/14/2020 00:37   MR BRAIN WO CONTRAST  Result Date: 08/16/2020 CLINICAL DATA:  86 year old male with recent mental status change. TIA. EXAM: MRI HEAD WITHOUT CONTRAST TECHNIQUE: Multiplanar, multiecho pulse sequences of the brain and surrounding structures were obtained without intravenous contrast. COMPARISON:  Brain MRI 02/20/2018.  Head CT 08/14/2020. FINDINGS: Brain: No restricted diffusion to suggest acute infarction. No midline shift, mass effect,  evidence of mass lesion, ventriculomegaly, extra-axial collection or acute intracranial hemorrhage. Cervicomedullary junction and pituitary are within normal limits. Cerebral volume is not significantly changed since 2019. Chronic asymmetry of the right occipital horn is stable and appears inconsequential. Sizable chronic lacunar infarct of the left pons. Superimposed multiple small chronic thalamic lacunar infarcts, greater on the left, confluent bilateral cerebral white matter T2 and FLAIR hyperintensity. Some associated chronic Wallerian degeneration suspected in the left midbrain (series 3, image 9). No chronic cerebral blood products identified. No definite cortical encephalomalacia. No new signal abnormality identified. Vascular: Major intracranial vascular flow voids are stable since 2019. Skull and upper cervical spine: Normal for age visible cervical spine, bone marrow signal. Sinuses/Orbits: Stable, negative. Other: Mild chronic left mastoid effusion has not significantly changed. Trace retained secretions or retention cyst in the nasopharynx. Grossly normal other visible internal auditory structures. Chronic left superior vertex scalp soft tissue deficiency, likely postoperative or posttraumatic and stable since 2019. IMPRESSION: No acute intracranial abnormality. Advanced chronic small vessel disease  appears stable since 2019 with no new intracranial abnormality. Electronically Signed   By: Genevie Ann M.D.   On: 08/16/2020 15:52   DG Chest Port 1 View  Result Date: 08/13/2020 CLINICAL DATA:  Syncope. EXAM: PORTABLE CHEST 1 VIEW COMPARISON:  July 01, 2020 FINDINGS: Mild, diffuse, chronic appearing increased interstitial lung markings are noted. There is no evidence of acute infiltrate, pleural effusion or pneumothorax. The heart size and mediastinal contours are within normal limits. A radiopaque coronary artery stent is seen. There is marked severity calcification of the aortic arch. An intact left  shoulder replacement is seen. Chronic deformities are seen involving the head and surgical neck of the right humerus. IMPRESSION: No acute or active cardiopulmonary disease. Electronically Signed   By: Virgina Norfolk M.D.   On: 08/13/2020 23:56    Microbiology: Recent Results (from the past 240 hour(s))  Resp Panel by RT-PCR (Flu A&B, Covid) Urine, Catheterized     Status: None   Collection Time: 08/14/20  4:27 AM   Specimen: Urine, Catheterized; Nasopharyngeal(NP) swabs in vial transport medium  Result Value Ref Range Status   SARS Coronavirus 2 by RT PCR NEGATIVE NEGATIVE Final    Comment: (NOTE) SARS-CoV-2 target nucleic acids are NOT DETECTED.  The SARS-CoV-2 RNA is generally detectable in upper respiratory specimens during the acute phase of infection. The lowest concentration of SARS-CoV-2 viral copies this assay can detect is 138 copies/mL. A negative result does not preclude SARS-Cov-2 infection and should not be used as the sole basis for treatment or other patient management decisions. A negative result may occur with  improper specimen collection/handling, submission of specimen other than nasopharyngeal swab, presence of viral mutation(s) within the areas targeted by this assay, and inadequate number of viral copies(<138 copies/mL). A negative result must be combined with clinical observations, patient history, and epidemiological information. The expected result is Negative.  Fact Sheet for Patients:  EntrepreneurPulse.com.au  Fact Sheet for Healthcare Providers:  IncredibleEmployment.be  This test is no t yet approved or cleared by the [place] FDA and  has been authorized for detection and/or diagnosis of SARS-CoV-2 by FDA under an Emergency Use Authorization (EUA). This EUA will remain  in effect (meaning this test can be used) for the duration of the COVID-19 declaration under Section 564(b)(1) of the Act,  21 U.S.C.section 360bbb-3(b)(1), unless the authorization is terminated  or revoked sooner.       Influenza A by PCR NEGATIVE NEGATIVE Final   Influenza B by PCR NEGATIVE NEGATIVE Final    Comment: (NOTE) The Xpert Xpress SARS-CoV-2/FLU/RSV plus assay is intended as an aid in the diagnosis of influenza from Nasopharyngeal swab specimens and should not be used as a sole basis for treatment. Nasal washings and aspirates are unacceptable for Xpert Xpress SARS-CoV-2/FLU/RSV testing.  Fact Sheet for Patients: EntrepreneurPulse.com.au  Fact Sheet for Healthcare Providers: IncredibleEmployment.be  This test is not yet approved or cleared by the [place] FDA and has been authorized for detection and/or diagnosis of SARS-CoV-2 by FDA under an Emergency Use Authorization (EUA). This EUA will remain in effect (meaning this test can be used) for the duration of the COVID-19 declaration under Section 564(b)(1) of the Act, 21 U.S.C. section 360bbb-3(b)(1), unless the authorization is terminated or revoked.  Performed at [facility], [facility] [address], [place], [place] [address]      Labs: Basic Metabolic Panel: Recent Labs  Lab 08/14/20 0027 08/15/20 0042  NA 137 137  K 4.2 4.0  CL 101 105  CO2 25 25  GLUCOSE 110* 76  BUN 21 21  CREATININE 1.35* 1.12  CALCIUM 9.5 8.9  MG  --  1.7   Liver Function Tests: Recent Labs  Lab 08/14/20 0027  AST 25  ALT 18  ALKPHOS 118  BILITOT 1.0  PROT 6.7  ALBUMIN 3.5   CBC: Recent Labs  Lab 08/14/20 0027 08/15/20 0042  WBC 10.0 7.3  NEUTROABS 7.4  --   HGB 14.9 12.8*  HCT 45.5 37.5*  MCV 92.3 89.9  PLT 204 207    Recent Labs    06/28/20 1849 08/14/20 0027  BNP 133.2* 265.3*    Principal Problem:   Syncope Active Problems:   Dementia (HCC)   Long term current use of anticoagulant therapy   AF (paroxysmal atrial fibrillation) (HCC)   HTN (hypertension)   HLD  (hyperlipidemia)   AKI (acute kidney injury) ([place])   Acute encephalopathy   Prolonged QT interval   Time coordinating discharge: 35 minutes  Signed:  Murray Hodgkins, MD  [facility]  08/17/2020, 11:17 AM

## 2020-08-17 NOTE — TOC Transition Note (Signed)
Transition of Care [facility]) - CM/SW Discharge Note   Patient Details  Name: Derek [name] MRN: [id number] Date of Birth: Aug 29, 1927  Transition of Care [facility]) CM/SW Contact:  Pollie Friar, RN Phone Number: 08/17/2020, 1:13 PM   Clinical Narrative:    Pt is discharging home with [facility] services through [facility] with palliative through [place]. Both are aware of d/c home.  Son has arranged for bed and wheelchair at home.  3 in 1 delivered to the room per [employer]. Daughter to provide transport home.   Final next level of care: Home w Home Health Services Barriers to Discharge: No Barriers Identified   Patient Goals and CMS Choice   CMS Medicare.gov Compare Post Acute Care list provided to:: Patient Represenative (must comment) Choice offered to / list presented to : Adult Children  Discharge Placement                       Discharge Plan and Services   Discharge Planning Services: CM Consult Post Acute Care Choice: Home Health          DME Arranged: 3-N-1 DME Agency: [employer] Date DME Agency Contacted: 08/17/20   Representative spoke with at DME Agency: [place]: PT,OT [place] Agency: [facility] Date [facility] Agency Contacted: 08/15/20   Representative spoke with at [place]: [place] Interventions     Readmission Risk Interventions No flowsheet data found.

## 2020-08-17 NOTE — Consult Note (Signed)
   [facility] Inpatient Consult   08/17/2020  Derek [name] [birth year] [id number]  [place] Organization [ACO] Patient: Medicare  Today patient is showing [facility] Active status as pending  Patient is assigned with [place] Management for chronic disease management services.  Chart review of TOC team that patient has post hospital follow up with [facility], home health, and son has DME and personal care services arranged.  Plan:  Will alert assigned [place] - NP of admission and transition of care information.  Of note, [facility] Care Management services does not replace or interfere with any services that are needed or arranged by inpatient [facility] care management team.  For additional questions or referrals please contact:  Natividad Brood, RN BSN [facility] Liaison  [phone] business mobile phone Toll free office [phone]  Fax number: [phone] [email or web address]

## 2020-08-17 NOTE — Progress Notes (Signed)
[facility])  Referral received (on 12/8)  for outpatient palliative care services once pt is discharged.  ACC will follow up with family and pt once he is discharged.   Venia Carbon RN, BSN, [facility] Liaison

## 2020-08-17 NOTE — Progress Notes (Signed)
  Speech Language Pathology Treatment: Dysphagia  Patient Details Name: Derek [name] MRN: [id number] DOB: 01/07/1927 Today's Date: 08/17/2020 Time: 7408-1448 SLP Time Calculation (min) (ACUTE ONLY): 16 min  Assessment / Plan / Recommendation Clinical Impression  Pt seen for ongoing dysphagia management and diet tolerance.  Pt was able to feed himself regular solid graham cracker, puree, and thin liquid by cup.  There was a delayed cough following graham cracker which continued over to next trial.  Somewhat congested/wet qulaity to cough, but following this episode, pt did not exhibit any other clinical s/s of aspiration with further trials of any consistency or texture.  During assessment on 12/9 pt was noted to cough following large sip of water without other s/s of aspiration with smaller sips.  Pt continued to feed himself and consumed 4oz of applesauce and a two crackers independently over course of session. Pt took cup sips of water independently as well.   RN reports giving medications crushed in puree today rather than whole.  Continue to crush medications if permissible when needed.   Recommend continuing regular texture diet with thin liquid with intermittent supervision.  SLP to follow for diet tolerance, if episodes of coughing persist, consider instrumental evaluation of swallow.    HPI HPI: 86 y.o. male with medical history significant of paroxysmal atrial fibrillation on Eliquis, coronary artery disease, moderate aortic stenosis, hypertension, hyperlipidemia, chronic kidney disease stage IIIa, gastroesophageal reflux disease, and dementia who presents after having a syncopal event at home last night around 10:30 PM.  Patient is unable to provide any information regarding the event.  His son gives additional history over the phone.  At baseline patient has dementia, but is normally able to carry on a conversation and knows where he is.  Apparently, last night patient had started  babbling not in [place] and without proper sentence structure which was unusual.  Son noticed that he was eating with his fingers, and then subsequently slumped over in the chair.  He never fell or suffered any trauma to his head.  Patient has passed out before in the past, and normally when his son lays him down on the floor like he did last night he comes back to within 5 minutes or so.  Last night the patient did not come back to like normal and he eventually called EMS.  Previous syncopal events have happened after the patient has had a big meal or when exerting himself.  He had just recently been hospitalized in October after suffering a fall thought to be secondary to syncope.      SLP Plan  Continue with current plan of care       Recommendations  Diet recommendations: Regular;Thin liquid Liquids provided via: Cup;No straw Medication Administration: Crushed with puree Supervision: Patient able to self feed;Intermittent supervision to cue for compensatory strategies Compensations: Slow rate;Small sips/bites;Minimize environmental distractions Postural Changes and/or Swallow Maneuvers: Seated upright 90 degrees                Oral Care Recommendations: Oral care BID Follow up Recommendations: 24 hour supervision/assistance SLP Visit Diagnosis: Dysphagia, oropharyngeal phase (R13.12) Plan: Continue with current plan of care       [place], [place], [place] Office: [phone] 08/17/2020, 11:49 AM

## 2020-08-17 NOTE — Progress Notes (Signed)
Physical Therapy Treatment Patient Details Name: Derek [name] MRN: [id number] DOB: [birth year] Today's Date: 08/17/2020    History of Present Illness Pt is a 86 y/o male admitted secondary to syncopal episode. PMH includes dementia, HTN and s/p kyphoplasty.     PT Comments    Pt making progress towards goals as he required decreased assistance for functional mobility this date. He was able to transition supine > sit EOB with HOB elevated with modAx2, come to stand with modAx1, and take several steps with a RW with modAx2 to transfer to his recliner this date. He requires extensive multi-modal cues to sequence tasks and maintain safety though. He demonstrates decreased leg strength and ability to shift weight to allow leg advancement with gait. Thus, with 2 additional standing bouts focused on gait simulation through cuing pt to shift weight between legs laterally and then march in place while telling pt to dance while listening to [place], as pt displayed happiness with music and dancing. Will continue to follow acutely. Current recommendations remain appropriate. If son prefers not SNF, then pt would benefit from [facility] PT.   Follow Up Recommendations  SNF;Supervision/Assistance - 24 hour     Equipment Recommendations  Rolling walker with 5" wheels;3in1 (PT)    Recommendations for Other Services       Precautions / Restrictions Precautions Precautions: Fall Restrictions Weight Bearing Restrictions: No    Mobility  Bed Mobility Overal bed mobility: Needs Assistance Bed Mobility: Supine to Sit     Supine to sit: HOB elevated;Mod assist;+2 for physical assistance     General bed mobility comments: Pt able to transition legs off EOB when cued but required modAx2 to manage trunk and cue pt to push up with L arm to ascend trunk.  Transfers Overall transfer level: Needs assistance Equipment used: Rolling walker (2 wheeled) Transfers: Sit to/from [employer] Sit to Stand: Mod assist Stand pivot transfers: Mod assist;+2 physical assistance;+2 safety/equipment       General transfer comment: ModAx1 and extra time to power up to stand, cuing for hand placement on current seated surface rather than RW to come to stand but pt not following cues. ModAx2 to manage RW and provide tactile and verbal cues and assistance to shift weight and advance legs for turn to sit in chair.  Ambulation/Gait Ambulation/Gait assistance: Mod assist;+2 physical assistance;+2 safety/equipment Gait Distance (Feet): 2 Feet Assistive device: Rolling walker (2 wheeled) Gait Pattern/deviations: Decreased stride length;Shuffle;Narrow base of support;Trunk flexed;Decreased weight shift to right;Decreased weight shift to left Gait velocity: decreased Gait velocity interpretation: <1.31 ft/sec, indicative of household ambulator General Gait Details: Ambulates with foot drag and difficulty weight shifting to allow steps without physical assistance even with max multi-modal cues. Practices weight shifting in place and minimal foot clearance marching with use of RW and modAx1 for 2 additional bouts to simulate gait.   Stairs             Wheelchair Mobility    Modified Rankin (Stroke Patients Only)       Balance Overall balance assessment: Needs assistance Sitting-balance support: No upper extremity supported;Feet supported Sitting balance-Leahy Scale: Fair Sitting balance - Comments: Intermittent min-modA when pt would lose balance but otherwise min guard assist to sit statically EOB. Postural control: Posterior lean Standing balance support: Bilateral upper extremity supported Standing balance-Leahy Scale: Poor Standing balance comment: Reliant on BUE and external support  Cognition Arousal/Alertness: Awake/alert Behavior During Therapy: WFL for tasks assessed/performed Overall Cognitive Status: History of  cognitive impairments - at baseline                                 General Comments: Maximal 1-step multimodal cues to sequence functional mobility with use of RW.      Exercises      General Comments        Pertinent Vitals/Pain Pain Assessment: Faces Faces Pain Scale: No hurt Pain Intervention(s): Limited activity within patient's tolerance;Monitored during session;Repositioned    Home Living                      Prior Function            PT Goals (current goals can now be found in the care plan section) Acute Rehab PT Goals Patient Stated Goal: to listen to Christmas music PT Goal Formulation: With patient Time For Goal Achievement: 08/28/20 Potential to Achieve Goals: Fair Progress towards PT goals: Progressing toward goals    Frequency    Min 2X/week      PT Plan Current plan remains appropriate    Co-evaluation              AM-PAC PT "6 Clicks" Mobility   Outcome Measure  Help needed turning from your back to your side while in a flat bed without using bedrails?: A Lot Help needed moving from lying on your back to sitting on the side of a flat bed without using bedrails?: A Lot Help needed moving to and from a bed to a chair (including a wheelchair)?: A Lot Help needed standing up from a chair using your arms (e.g., wheelchair or bedside chair)?: A Lot Help needed to walk in hospital room?: Total Help needed climbing 3-5 steps with a railing? : Total 6 Click Score: 10    End of Session Equipment Utilized During Treatment: Gait belt Activity Tolerance: Patient tolerated treatment well Patient left: in chair;with call bell/phone within reach;with chair alarm set Nurse Communication: Mobility status PT Visit Diagnosis: Unsteadiness on feet (R26.81);Muscle weakness (generalized) (M62.81);Difficulty in walking, not elsewhere classified (R26.2);Other abnormalities of gait and mobility (R26.89)     Time: 0349-1791 PT Time  Calculation (min) (ACUTE ONLY): 24 min  Charges:  $Gait Training: 8-22 mins $Therapeutic Activity: 8-22 mins                     Moishe Spice, PT, DPT Acute Rehabilitation Services  Pager: [phone] Office: [place] 08/17/2020, 11:52 AM

## 2020-08-17 NOTE — Telephone Encounter (Signed)
Pt currently admitted to hospital for syncope and encephalopathy.  Son states pt has been on Amlodipine 5mg  QD for 10 years and this was stopped 10/26.  Son would like for pt to go back on this.  States pt has been going downhill since this was stopped.  He really feels that pt needs to go back on this at least until he is seen in follow up.  Son states they are unsure of exactly what is going on with the pt but has heard mention of possible mini stroke.  Advised I will send to Dr. Tamala Julian for review.  Son would really like to talk to Dr. Tamala Julian if possible.

## 2020-08-17 NOTE — Telephone Encounter (Signed)
Patient's son called to talk with Dr. Tamala Julian or Ermalinda Barrios. States that it was important because father is currently admitted and has some questions regarding health and medications he is taking. Please call back to discuss

## 2020-08-17 NOTE — Telephone Encounter (Signed)
*  STAT* If patient is at the pharmacy, call can be transferred to refill team.   1. Which medications need to be refilled? (please list name of each medication and dose if known)  metoprolol tartrate (LOPRESSOR) 25 MG tablet  2. Which pharmacy/location (including street and city if local pharmacy) is medication to be sent to? [place], [place]  3. Do they need a 30 day or 90 day supply? [place]

## 2020-08-17 NOTE — Telephone Encounter (Signed)
Son called wanting to talk to Dr. Jannifer Franklin about long term plan.   Father was going to be discharged from hospital today. Derek [name] has done much worse over the past 1-2 months and his son wanted to discuss him going back on amlodipine.   I let him know that that decision would be best made by the doctors seeing him in the hospital but that I would forward the message to Dr. Jannifer Franklin

## 2020-08-18 NOTE — Telephone Encounter (Signed)
I reviewed hospital records. Unable to recommend against those from treating physicians since they had primary involvement.

## 2020-08-19 ENCOUNTER — Other Ambulatory Visit: Payer: Self-pay | Admitting: Interventional Cardiology

## 2020-08-20 ENCOUNTER — Other Ambulatory Visit: Payer: Self-pay | Admitting: Physician Assistant

## 2020-08-20 DIAGNOSIS — I129 Hypertensive chronic kidney disease with stage 1 through stage 4 chronic kidney disease, or unspecified chronic kidney disease: Secondary | ICD-10-CM | POA: Diagnosis not present

## 2020-08-20 DIAGNOSIS — S32592A Other specified fracture of left pubis, initial encounter for closed fracture: Secondary | ICD-10-CM | POA: Diagnosis not present

## 2020-08-20 DIAGNOSIS — F0390 Unspecified dementia without behavioral disturbance: Secondary | ICD-10-CM | POA: Diagnosis not present

## 2020-08-20 DIAGNOSIS — N1831 Chronic kidney disease, stage 3a: Secondary | ICD-10-CM | POA: Diagnosis not present

## 2020-08-20 DIAGNOSIS — S32512D Fracture of superior rim of left pubis, subsequent encounter for fracture with routine healing: Secondary | ICD-10-CM | POA: Diagnosis not present

## 2020-08-20 DIAGNOSIS — I480 Paroxysmal atrial fibrillation: Secondary | ICD-10-CM | POA: Diagnosis not present

## 2020-08-20 NOTE — Telephone Encounter (Signed)
Spoke with son and made him aware of recommendations.  Son will monitor pt's BP and let us know if consistently 140/80 or higher.  Son appreciative for call.

## 2020-08-20 NOTE — Telephone Encounter (Signed)
I called and talked with the son.  The patient was recently in the hospital, he had an apparent syncopal event.  He was thought to have orthostatic hypotension previously, he has been taken off of the amlodipine, metoprolol, and isosorbide dinitrate.  The patient however went back into atrial fibrillation and the metoprolol was added back.  The amlodipine was at 10 mg, but it was cut down to 5 mg reported was discontinued.  During this hospitalization, the blood pressure was somewhat elevated.  MRI of the brain did not show evidence of a stroke, but the son does not believe that he is returned to his baseline, no evidence of other issues such as bladder infection or pneumonia.  I will be happy to try to get him into the office to take another look.  The son wants to get him back on amlodipine, but I would defer to the cardiology physicians who are controlling these medications.

## 2020-08-21 ENCOUNTER — Other Ambulatory Visit: Payer: Self-pay | Admitting: *Deleted

## 2020-08-21 NOTE — Telephone Encounter (Signed)
Reached patient's daughter by phone.  She accepted the appointment for 09/11/2020 @ 0730.  Aware to arrive by 0715.

## 2020-08-21 NOTE — Patient Outreach (Signed)
[facility]) Care Management  08/21/2020  Derek [name] 09/07/1927 [id number]  Telephone outreach for [place] on Emmi call. Unfortunately, Derek [name] has dementia and should not be getting Emmi calls.  He did just have a 4 day hospital stay for syncope. He was discharged home with home health. His son received a transition of care call from [facility], which indicated there are no current needs.  Call to son, Derek [name]., Unsuccessful, left message and requested a return call to verify no further needs.  Derek [name]. Derek Neither, MSN, [facility] Gerontological Nurse Practitioner [facility] Care Management [phone]

## 2020-08-22 ENCOUNTER — Telehealth: Payer: Self-pay

## 2020-08-22 NOTE — Telephone Encounter (Signed)
Telephone call to patient to schedule palliative care visit with patient. Patient/family in agreement with home visit on 12/16 @10am .

## 2020-08-23 ENCOUNTER — Other Ambulatory Visit: Payer: Medicare Other | Admitting: *Deleted

## 2020-08-23 ENCOUNTER — Other Ambulatory Visit: Payer: Self-pay

## 2020-08-23 ENCOUNTER — Other Ambulatory Visit: Payer: Medicare Other

## 2020-08-23 VITALS — BP 159/88 | HR 54 | Temp 97.6°F | Resp 17

## 2020-08-23 DIAGNOSIS — Z515 Encounter for palliative care: Secondary | ICD-10-CM

## 2020-08-28 DIAGNOSIS — I2510 Atherosclerotic heart disease of native coronary artery without angina pectoris: Secondary | ICD-10-CM | POA: Diagnosis not present

## 2020-08-28 DIAGNOSIS — I252 Old myocardial infarction: Secondary | ICD-10-CM | POA: Diagnosis not present

## 2020-08-28 DIAGNOSIS — N1831 Chronic kidney disease, stage 3a: Secondary | ICD-10-CM | POA: Diagnosis not present

## 2020-08-28 DIAGNOSIS — I129 Hypertensive chronic kidney disease with stage 1 through stage 4 chronic kidney disease, or unspecified chronic kidney disease: Secondary | ICD-10-CM | POA: Diagnosis not present

## 2020-08-28 DIAGNOSIS — I480 Paroxysmal atrial fibrillation: Secondary | ICD-10-CM | POA: Diagnosis not present

## 2020-08-28 DIAGNOSIS — G40909 Epilepsy, unspecified, not intractable, without status epilepticus: Secondary | ICD-10-CM | POA: Diagnosis not present

## 2020-08-28 DIAGNOSIS — Z8673 Personal history of transient ischemic attack (TIA), and cerebral infarction without residual deficits: Secondary | ICD-10-CM | POA: Diagnosis not present

## 2020-08-28 DIAGNOSIS — I700 Atherosclerosis of aorta: Secondary | ICD-10-CM | POA: Diagnosis not present

## 2020-08-28 DIAGNOSIS — S32592A Other specified fracture of left pubis, initial encounter for closed fracture: Secondary | ICD-10-CM | POA: Diagnosis not present

## 2020-08-28 DIAGNOSIS — K219 Gastro-esophageal reflux disease without esophagitis: Secondary | ICD-10-CM | POA: Diagnosis not present

## 2020-08-28 DIAGNOSIS — Z7901 Long term (current) use of anticoagulants: Secondary | ICD-10-CM | POA: Diagnosis not present

## 2020-08-28 DIAGNOSIS — N400 Enlarged prostate without lower urinary tract symptoms: Secondary | ICD-10-CM | POA: Diagnosis not present

## 2020-08-28 DIAGNOSIS — Z8701 Personal history of pneumonia (recurrent): Secondary | ICD-10-CM | POA: Diagnosis not present

## 2020-08-28 DIAGNOSIS — F0390 Unspecified dementia without behavioral disturbance: Secondary | ICD-10-CM | POA: Diagnosis not present

## 2020-08-28 DIAGNOSIS — G629 Polyneuropathy, unspecified: Secondary | ICD-10-CM | POA: Diagnosis not present

## 2020-08-28 DIAGNOSIS — F329 Major depressive disorder, single episode, unspecified: Secondary | ICD-10-CM | POA: Diagnosis not present

## 2020-08-28 DIAGNOSIS — I350 Nonrheumatic aortic (valve) stenosis: Secondary | ICD-10-CM | POA: Diagnosis not present

## 2020-08-28 DIAGNOSIS — E785 Hyperlipidemia, unspecified: Secondary | ICD-10-CM | POA: Diagnosis not present

## 2020-08-28 DIAGNOSIS — G4733 Obstructive sleep apnea (adult) (pediatric): Secondary | ICD-10-CM | POA: Diagnosis not present

## 2020-08-28 DIAGNOSIS — Z9181 History of falling: Secondary | ICD-10-CM | POA: Diagnosis not present

## 2020-08-28 DIAGNOSIS — E440 Moderate protein-calorie malnutrition: Secondary | ICD-10-CM | POA: Diagnosis not present

## 2020-08-28 DIAGNOSIS — S32512D Fracture of superior rim of left pubis, subsequent encounter for fracture with routine healing: Secondary | ICD-10-CM | POA: Diagnosis not present

## 2020-08-29 NOTE — Progress Notes (Signed)
COMMUNITY PALLIATIVE CARE SW NOTE  PATIENT NAME: Derek [name] DOB: 06-24-1927 MRN: [id number]  PRIMARY CARE PROVIDER: Seward Carol, MD  RESPONSIBLE PARTY:  Acct ID - Guarantor Home Phone Work Phone Relationship Acct Type  [id number] [place]ford [phone]  Self P/F     [place], [place], [place] [address]     PLAN OF CARE and INTERVENTIONS:             1. GOALS OF CARE/ ADVANCE CARE PLANNING: Goals of care is for patient to remain in his home through the end of life. Patient is a DNR, form in the home. 2. SOCIAL/EMOTIONAL/SPIRITUAL ASSESSMENT/ INTERVENTIONS:  SW and RN completed initial visit with patient at his home. His son- Gabrien, was present with him. SW and RN talked with patient's son in the kitchen. The team provided education to the son regarding the palliative care services, visit frequency and how to access support. He verbalized understanding and provided verbal consent to ongoing services. [place] provided a status update on patient. Karey advised that patient appears to have had a mini stroke as he described patient's steady, predictable decline. Patient is now ambulating with a walker and his gait is unsteady. Patient is eating with his fingers and at times appears to be incoherent. He has intermittent syncope episode where he passes out and start babbling. His appetite has declined to two smalls a day and patient needs encouragement to eat. It takes patient over an hour to eat. He takes Boost supplement daily. Patient has loss over 20 lbs in the last six months. Patient is dependent for ADL's. He is incontinent. Patient will have hired caregivers through [employer], 9/10 hours a day, five days a week. Patient's eyes may have some infection in his eyes as they are red, watery drainage and patient has been rubbing his eyes. Patient is also having loose stools. His son plans to make an appointment to have patient evaluated.The team visited with patient. He was in his  recliner sleeping and aroused intermittently throughout the visit. Patient responded to simple yes/no questions and cognitive deficits appeared to be present.  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 Patient graduated from [employer]. 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 He has widowed 10 years. He has two children. He is a member of [employer]. He has advanced directives. His son resides in the home with him and has been patient's PCG, but is fatigued physically and emotionally from watching his father decline. He is hoping that having the paid caregivers will be helpful to his dad and him as he recognizes patient needs more hands on care. SW provided supportive presence, active listening, assessment of needs and comfort, education, reassurance of support and supportive counseling. SW will provide ongoing psychosocial assessment and support through routine visits with patient/PCG. 3. PATIENT/CAREGIVER EDUCATION/ COPING: Patient is alert and oriented to self only. His son is his PCG. His son is fatigue with patient's care. Overall he has good family support and is coping adequately. 4. PERSONAL EMERGENCY PLAN:  911 can be activated for emergencies. 5. COMMUNITY RESOURCES COORDINATION/ HEALTH CARE NAVIGATION:  Patient will receive private personal care assistance through [facility] [employer]. Patient will receive 9/10 hours, 5 days a week. SW reinforced access to palliative care support when needed.  6. FINANCIAL/LEGAL CONCERNS/INTERVENTIONS:  None.     SOCIAL HX:  Social History   Tobacco Use  Smoking status: Never Smoker   Smokeless tobacco: Never Used  Substance Use Topics   Alcohol use: Yes    Alcohol/week: 1.0 standard drink    Types: 1 Glasses of wine per week    Comment: occasionally    CODE STATUS: DNR ADVANCED DIRECTIVES: Yes MOST FORM COMPLETE:  No HOSPICE EDUCATION PROVIDED:  No  PPS: Patient is alert and oriented to self. He is declining cognitively, he is sleeping more, patient is incontinent and dependent for personal care. Patient requires encouragement to eat.   Duration of visit and documentation: 90 minutes.      [address] [place], [place]

## 2020-08-30 ENCOUNTER — Other Ambulatory Visit: Payer: Self-pay | Admitting: *Deleted

## 2020-08-30 NOTE — Patient Outreach (Signed)
[facility]) Care Management  08/30/2020  CESAREO VICKREY December 04, 1926 [id number]  Telephone outreach to pt's son.  He reports they have accepted palliative care services for his father. Advised that [facility] will will sign off his case since they will have this supportive service.  Eulah Pont. Myrtie Neither, MSN, [facility] Gerontological Nurse Practitioner [facility] Care Management [phone]

## 2020-09-11 ENCOUNTER — Telehealth: Payer: Self-pay | Admitting: Neurology

## 2020-09-11 ENCOUNTER — Ambulatory Visit: Payer: Self-pay | Admitting: Neurology

## 2020-09-11 NOTE — Telephone Encounter (Signed)
This patient came in late for his revisit appointment today.

## 2020-09-14 ENCOUNTER — Ambulatory Visit
Admission: RE | Admit: 2020-09-14 | Discharge: 2020-09-14 | Disposition: A | Discharge status: Home / Self Care | Payer: Medicare Other | Source: Ambulatory Visit | Attending: Internal Medicine | Admitting: Internal Medicine

## 2020-09-14 ENCOUNTER — Other Ambulatory Visit: Payer: Self-pay | Admitting: Internal Medicine

## 2020-09-14 DIAGNOSIS — F0390 Unspecified dementia without behavioral disturbance: Secondary | ICD-10-CM | POA: Diagnosis not present

## 2020-09-14 DIAGNOSIS — R059 Cough, unspecified: Secondary | ICD-10-CM

## 2020-09-14 DIAGNOSIS — R627 Adult failure to thrive: Secondary | ICD-10-CM | POA: Diagnosis not present

## 2020-09-14 DIAGNOSIS — I350 Nonrheumatic aortic (valve) stenosis: Secondary | ICD-10-CM | POA: Diagnosis not present

## 2020-09-17 NOTE — Progress Notes (Signed)
COMMUNITY PALLIATIVE CARE RN NOTE  PATIENT NAME: Derek [name] DOB: 1927/06/06 MRN: [id number]  PRIMARY CARE PROVIDER: Seward Carol, MD  RESPONSIBLE PARTY: Sheryn Bison (son) Acct ID - Guarantor Home Phone Work Phone Relationship Acct Type  [id number] [place] [phone]  Self P/F     [address] [place], [place] [address]   Covid-19 Pre-screening Negative  PLAN OF CARE and INTERVENTION:  1. ADVANCE CARE PLANNING/GOALS OF CARE: Goal is for patient to remain in his home. 2. PATIENT/CAREGIVER EDUCATION: Explained Palliative care services, symptom management, safe mobility/transfers, s/s of infection 3. DISEASE STATUS: Joint visit made with palliative care SW, Katheren Puller for initial visit. Met with patient and his son, Derek [name]. Son provided patient's health history. He has had a very drastic decline in the past few weeks. Patient is less coherent, having increased difficulties following instructions and he is not babbling when speaking. Son states that patient came out of rehab on 10/19 and was doing great and at that time was ambulating without the use of a walker. Now patient requires extensive 1 person assistance with bathing, dressing, standing and transfers. Yesterday, it took patient about 10 min to walk about 50 ft with his walker and was very unstable. Patient has been having these episodes of where he is passing out and son has to lower him to the floor. In about 5 minutes he starts to come around and be ok. Unsure of cause. He has an appointment with his Neurologist on 09/11/20. During the visit, patient is aroused with verbal and tactile stimulation. He is lethargic and has difficulty staying awake initially. He would say a few clear words mixed with unintelligiible ones. He did smile when asked questions. He started to wake up more towards end of visit. He is incontinent of both bowel and bladder and wears Prevail briefs. His stools have been loose. Last bowel  movement was this morning. Patient's long term care policy got approved in September. He says that [facility] will be starting soon for 9-10 hours per/day 5 days/week. He has erratic eating habits. He eats about 2 meals/day and it takes him about an hour to eat. He remains able to feed himself independently at this time. He drinks Boost daily in the am. Son manages his medications. Denies dysphagia. He has received his Flu, pneumonia and both Covid vaccines plus his Booster. Son is agreeable to future visits from Palliative care. Will continue to monitor.   HISTORY OF PRESENT ILLNESS: This is a 87 yo male with a history of dementia, pAfib, CAD, moderate aortic stenosis, HTN, HLD and CKD stage 3. He was recently hospitalized from 08/13/20 to 08/17/20 d/t a syncope episode. Palliative care team has been asked to follow patient for additional support. Will visit patient monthly and PRN.  CODE STATUS: DNR ADVANCED DIRECTIVES: Y MOST FORM: no PPS: weak 40%   PHYSICAL EXAM:   VITALS: T: 97.6, BP 159/88, P 54, R 17, O2 94% RA LUNGS: clear to auscultation  CARDIAC: Cor Brady EXTREMITIES: No edema SKIN: Thin/frail skin  NEURO: Alert and oriented to person/place, lethargic, some unintelligible speech, increased generalized weakness, ambulatory very short distances with walker and 1 person assist    (Duration of visit and documentation 75 minutes)   Daryl Eastern, RN BSN

## 2020-09-19 DIAGNOSIS — I129 Hypertensive chronic kidney disease with stage 1 through stage 4 chronic kidney disease, or unspecified chronic kidney disease: Secondary | ICD-10-CM | POA: Diagnosis not present

## 2020-09-19 DIAGNOSIS — S32512D Fracture of superior rim of left pubis, subsequent encounter for fracture with routine healing: Secondary | ICD-10-CM | POA: Diagnosis not present

## 2020-09-19 DIAGNOSIS — S32592A Other specified fracture of left pubis, initial encounter for closed fracture: Secondary | ICD-10-CM | POA: Diagnosis not present

## 2020-09-19 DIAGNOSIS — F0390 Unspecified dementia without behavioral disturbance: Secondary | ICD-10-CM | POA: Diagnosis not present

## 2020-09-19 DIAGNOSIS — I480 Paroxysmal atrial fibrillation: Secondary | ICD-10-CM | POA: Diagnosis not present

## 2020-09-19 DIAGNOSIS — N1831 Chronic kidney disease, stage 3a: Secondary | ICD-10-CM | POA: Diagnosis not present

## 2020-09-26 DIAGNOSIS — N1831 Chronic kidney disease, stage 3a: Secondary | ICD-10-CM | POA: Diagnosis not present

## 2020-09-26 DIAGNOSIS — S32512D Fracture of superior rim of left pubis, subsequent encounter for fracture with routine healing: Secondary | ICD-10-CM | POA: Diagnosis not present

## 2020-09-26 DIAGNOSIS — I480 Paroxysmal atrial fibrillation: Secondary | ICD-10-CM | POA: Diagnosis not present

## 2020-09-26 DIAGNOSIS — S32592A Other specified fracture of left pubis, initial encounter for closed fracture: Secondary | ICD-10-CM | POA: Diagnosis not present

## 2020-09-26 DIAGNOSIS — I129 Hypertensive chronic kidney disease with stage 1 through stage 4 chronic kidney disease, or unspecified chronic kidney disease: Secondary | ICD-10-CM | POA: Diagnosis not present

## 2020-09-26 DIAGNOSIS — F0390 Unspecified dementia without behavioral disturbance: Secondary | ICD-10-CM | POA: Diagnosis not present

## 2020-09-27 DIAGNOSIS — I2510 Atherosclerotic heart disease of native coronary artery without angina pectoris: Secondary | ICD-10-CM | POA: Diagnosis not present

## 2020-09-27 DIAGNOSIS — G4733 Obstructive sleep apnea (adult) (pediatric): Secondary | ICD-10-CM | POA: Diagnosis not present

## 2020-09-27 DIAGNOSIS — S32512D Fracture of superior rim of left pubis, subsequent encounter for fracture with routine healing: Secondary | ICD-10-CM | POA: Diagnosis not present

## 2020-09-27 DIAGNOSIS — I129 Hypertensive chronic kidney disease with stage 1 through stage 4 chronic kidney disease, or unspecified chronic kidney disease: Secondary | ICD-10-CM | POA: Diagnosis not present

## 2020-09-27 DIAGNOSIS — I252 Old myocardial infarction: Secondary | ICD-10-CM | POA: Diagnosis not present

## 2020-09-27 DIAGNOSIS — I350 Nonrheumatic aortic (valve) stenosis: Secondary | ICD-10-CM | POA: Diagnosis not present

## 2020-09-27 DIAGNOSIS — E440 Moderate protein-calorie malnutrition: Secondary | ICD-10-CM | POA: Diagnosis not present

## 2020-09-27 DIAGNOSIS — S32592A Other specified fracture of left pubis, initial encounter for closed fracture: Secondary | ICD-10-CM | POA: Diagnosis not present

## 2020-09-27 DIAGNOSIS — Z8673 Personal history of transient ischemic attack (TIA), and cerebral infarction without residual deficits: Secondary | ICD-10-CM | POA: Diagnosis not present

## 2020-09-27 DIAGNOSIS — G40909 Epilepsy, unspecified, not intractable, without status epilepticus: Secondary | ICD-10-CM | POA: Diagnosis not present

## 2020-09-27 DIAGNOSIS — I700 Atherosclerosis of aorta: Secondary | ICD-10-CM | POA: Diagnosis not present

## 2020-09-27 DIAGNOSIS — F329 Major depressive disorder, single episode, unspecified: Secondary | ICD-10-CM | POA: Diagnosis not present

## 2020-09-27 DIAGNOSIS — E785 Hyperlipidemia, unspecified: Secondary | ICD-10-CM | POA: Diagnosis not present

## 2020-09-27 DIAGNOSIS — F0390 Unspecified dementia without behavioral disturbance: Secondary | ICD-10-CM | POA: Diagnosis not present

## 2020-09-27 DIAGNOSIS — Z8701 Personal history of pneumonia (recurrent): Secondary | ICD-10-CM | POA: Diagnosis not present

## 2020-09-27 DIAGNOSIS — G629 Polyneuropathy, unspecified: Secondary | ICD-10-CM | POA: Diagnosis not present

## 2020-09-27 DIAGNOSIS — N400 Enlarged prostate without lower urinary tract symptoms: Secondary | ICD-10-CM | POA: Diagnosis not present

## 2020-09-27 DIAGNOSIS — I480 Paroxysmal atrial fibrillation: Secondary | ICD-10-CM | POA: Diagnosis not present

## 2020-09-27 DIAGNOSIS — Z7901 Long term (current) use of anticoagulants: Secondary | ICD-10-CM | POA: Diagnosis not present

## 2020-09-27 DIAGNOSIS — Z9181 History of falling: Secondary | ICD-10-CM | POA: Diagnosis not present

## 2020-09-27 DIAGNOSIS — K219 Gastro-esophageal reflux disease without esophagitis: Secondary | ICD-10-CM | POA: Diagnosis not present

## 2020-09-27 DIAGNOSIS — N1831 Chronic kidney disease, stage 3a: Secondary | ICD-10-CM | POA: Diagnosis not present

## 2020-09-28 ENCOUNTER — Telehealth: Payer: Self-pay

## 2020-09-28 NOTE — Telephone Encounter (Signed)
(  12:10p) Telephone call to patient to schedule palliative care visit with patient. Patient/family in agreement with home visit on 10/05/20@ 10:30am.

## 2020-10-04 DIAGNOSIS — S32592A Other specified fracture of left pubis, initial encounter for closed fracture: Secondary | ICD-10-CM | POA: Diagnosis not present

## 2020-10-04 DIAGNOSIS — N1831 Chronic kidney disease, stage 3a: Secondary | ICD-10-CM | POA: Diagnosis not present

## 2020-10-04 DIAGNOSIS — S32512D Fracture of superior rim of left pubis, subsequent encounter for fracture with routine healing: Secondary | ICD-10-CM | POA: Diagnosis not present

## 2020-10-04 DIAGNOSIS — F0390 Unspecified dementia without behavioral disturbance: Secondary | ICD-10-CM | POA: Diagnosis not present

## 2020-10-04 DIAGNOSIS — G629 Polyneuropathy, unspecified: Secondary | ICD-10-CM | POA: Diagnosis not present

## 2020-10-04 DIAGNOSIS — I129 Hypertensive chronic kidney disease with stage 1 through stage 4 chronic kidney disease, or unspecified chronic kidney disease: Secondary | ICD-10-CM | POA: Diagnosis not present

## 2020-10-05 ENCOUNTER — Telehealth: Payer: Self-pay | Admitting: Neurology

## 2020-10-05 ENCOUNTER — Other Ambulatory Visit: Payer: Medicare Other | Admitting: *Deleted

## 2020-10-05 ENCOUNTER — Other Ambulatory Visit: Payer: Self-pay

## 2020-10-05 ENCOUNTER — Other Ambulatory Visit: Payer: Medicare Other

## 2020-10-05 VITALS — BP 165/90 | HR 64 | Temp 97.6°F | Resp 16

## 2020-10-05 DIAGNOSIS — Z515 Encounter for palliative care: Secondary | ICD-10-CM

## 2020-10-05 DIAGNOSIS — I129 Hypertensive chronic kidney disease with stage 1 through stage 4 chronic kidney disease, or unspecified chronic kidney disease: Secondary | ICD-10-CM | POA: Diagnosis not present

## 2020-10-05 DIAGNOSIS — F0390 Unspecified dementia without behavioral disturbance: Secondary | ICD-10-CM | POA: Diagnosis not present

## 2020-10-05 DIAGNOSIS — N1831 Chronic kidney disease, stage 3a: Secondary | ICD-10-CM | POA: Diagnosis not present

## 2020-10-05 DIAGNOSIS — S32592A Other specified fracture of left pubis, initial encounter for closed fracture: Secondary | ICD-10-CM | POA: Diagnosis not present

## 2020-10-05 DIAGNOSIS — G629 Polyneuropathy, unspecified: Secondary | ICD-10-CM | POA: Diagnosis not present

## 2020-10-05 DIAGNOSIS — S32512D Fracture of superior rim of left pubis, subsequent encounter for fracture with routine healing: Secondary | ICD-10-CM | POA: Diagnosis not present

## 2020-10-05 NOTE — Telephone Encounter (Signed)
PT.'s son Burman Bruington. called to state that dad can't come in for next week's appt. I offered him the next available in May & he declined as he states dad needs to be seen within the next month. Please advise.

## 2020-10-05 NOTE — Progress Notes (Signed)
COMMUNITY PALLIATIVE CARE SW NOTE  PATIENT NAME: Derek [name] DOB: 1927/08/20 MRN: [id number]  PRIMARY CARE PROVIDER: Seward Carol, MD  RESPONSIBLE PARTY:  Acct ID - Guarantor Home Phone Work Phone Relationship Acct Type  [id number] [place] [phone]  Self P/F     [place], [place], [place] [address]     PLAN OF CARE and INTERVENTIONS:             1. GOALS OF CARE/ ADVANCE CARE PLANNING:  Goals of care is for patient to remain in his home through the end of life. Patient is a DNR, form in the home.  2. SOCIAL/EMOTIONAL/SPIRITUAL ASSESSMENT/ INTERVENTIONS:  SW and RN- Daryl Eastern completed joint visit with patient His son-Bobie,Jr., and sitter-Margaret was present with him. Patient was awake, alert and up in his wheelchair. Patient smiled when greeted. His son and sitter updated the team on patient status. Patient's appetite is good as he is eating at least 2 good meals per day. He has a Charity fundraiser through [employer], (Tuesday-Friday). He also continues with [facility] for physical and occupational therapy. Patient is having some coughing with thin liquids following meals. He is also intermittently incontinent of bowel and bladder. Patient is ambulating with standby assistance. He also uses his walker. Patient can assist with transfers and is able to bare weight. Patient is sleeping 3 to 4 hours between meals and is difficult to wake up at times. Patient saw his primary caregiver two weeks ago and is scheduled to see his neurologist on tomorrow. However, the appointment maybe rescheduled due to not having transportation (son does not drive). Patient is not having pain, but does have edema in both feet. His son expressed concern about patient's poor hydration. His son is concern that patient could easily become dehydrated as patient is only drinking 8 oz. Boost. He plans to talk to the PCG about patient having an IV to hydrate him and and ABT to treat his cough.The RN  provided extensive education regarding the use [place] of IV and ABT. The son was receptive, but persistent about these two requests and plans to discuss this further with patient's physician. SW provided supportive presence, active listening, assessment of needs and comfort, education, reassurance of support and supportive counseling. SW will provide ongoing psychosocial assessment and support through routine visits with patient/PCG 1.  2. PATIENT/CAREGIVER EDUCATION/ COPING:  Patient and PCG appears to be coping well.  3. PERSONAL EMERGENCY PLAN:  911 can be activated for emergencies. 4. COMMUNITY RESOURCES COORDINATION/ HEALTH CARE NAVIGATION:  Patient receives PT and OT through [facility]. His has private caregivers through [facility]. 5. FINANCIAL/LEGAL CONCERNS/INTERVENTIONS:  None.     SOCIAL HX:  Social History   Tobacco Use  . Smoking status: Never Smoker  . Smokeless tobacco: Never Used  Substance Use Topics  . Alcohol use: Yes    Alcohol/week: 1.0 standard drink    Types: 1 Glasses of wine per week    Comment: occasionally    CODE STATUS: DNR ADVANCED DIRECTIVES: Yes MOST FORM COMPLETE:  No HOSPICE EDUCATION PROVIDED: No  PPS: Patient is alert and oriented to self. He is declining cognitively, he is sleeping more, patient is incontinent and dependent for personal care. Patient requires encouragement to eat.   Duration of visit and documentation: 90 minutes      Katheren Puller, LCSW

## 2020-10-08 NOTE — Telephone Encounter (Signed)
Called son back.  They had to cancel 2/1's appointment due to sisters daughter just had a baby in [place] and family went there and there is nobody to bring him on 2/1.  I was able to offer 3/3/ @ 0730.  Patient's [name] accepted appointment and agreed to arrive by 0715.

## 2020-10-09 ENCOUNTER — Ambulatory Visit: Payer: Medicare Other | Admitting: Neurology

## 2020-10-10 DIAGNOSIS — F0390 Unspecified dementia without behavioral disturbance: Secondary | ICD-10-CM | POA: Diagnosis not present

## 2020-10-10 DIAGNOSIS — S32512D Fracture of superior rim of left pubis, subsequent encounter for fracture with routine healing: Secondary | ICD-10-CM | POA: Diagnosis not present

## 2020-10-10 DIAGNOSIS — N1831 Chronic kidney disease, stage 3a: Secondary | ICD-10-CM | POA: Diagnosis not present

## 2020-10-10 DIAGNOSIS — G629 Polyneuropathy, unspecified: Secondary | ICD-10-CM | POA: Diagnosis not present

## 2020-10-10 DIAGNOSIS — I129 Hypertensive chronic kidney disease with stage 1 through stage 4 chronic kidney disease, or unspecified chronic kidney disease: Secondary | ICD-10-CM | POA: Diagnosis not present

## 2020-10-10 DIAGNOSIS — S32592A Other specified fracture of left pubis, initial encounter for closed fracture: Secondary | ICD-10-CM | POA: Diagnosis not present

## 2020-10-11 DIAGNOSIS — S32512D Fracture of superior rim of left pubis, subsequent encounter for fracture with routine healing: Secondary | ICD-10-CM | POA: Diagnosis not present

## 2020-10-11 DIAGNOSIS — F0390 Unspecified dementia without behavioral disturbance: Secondary | ICD-10-CM | POA: Diagnosis not present

## 2020-10-11 DIAGNOSIS — I129 Hypertensive chronic kidney disease with stage 1 through stage 4 chronic kidney disease, or unspecified chronic kidney disease: Secondary | ICD-10-CM | POA: Diagnosis not present

## 2020-10-11 DIAGNOSIS — S32592A Other specified fracture of left pubis, initial encounter for closed fracture: Secondary | ICD-10-CM | POA: Diagnosis not present

## 2020-10-11 DIAGNOSIS — G629 Polyneuropathy, unspecified: Secondary | ICD-10-CM | POA: Diagnosis not present

## 2020-10-11 DIAGNOSIS — N1831 Chronic kidney disease, stage 3a: Secondary | ICD-10-CM | POA: Diagnosis not present

## 2020-10-15 DIAGNOSIS — F0390 Unspecified dementia without behavioral disturbance: Secondary | ICD-10-CM | POA: Diagnosis not present

## 2020-10-15 DIAGNOSIS — I129 Hypertensive chronic kidney disease with stage 1 through stage 4 chronic kidney disease, or unspecified chronic kidney disease: Secondary | ICD-10-CM | POA: Diagnosis not present

## 2020-10-15 DIAGNOSIS — S32592A Other specified fracture of left pubis, initial encounter for closed fracture: Secondary | ICD-10-CM | POA: Diagnosis not present

## 2020-10-15 DIAGNOSIS — S32512D Fracture of superior rim of left pubis, subsequent encounter for fracture with routine healing: Secondary | ICD-10-CM | POA: Diagnosis not present

## 2020-10-15 DIAGNOSIS — G629 Polyneuropathy, unspecified: Secondary | ICD-10-CM | POA: Diagnosis not present

## 2020-10-15 DIAGNOSIS — N1831 Chronic kidney disease, stage 3a: Secondary | ICD-10-CM | POA: Diagnosis not present

## 2020-10-16 NOTE — Progress Notes (Signed)
COMMUNITY PALLIATIVE CARE RN NOTE  PATIENT NAME: Derek [name] DOB: 1927-07-14 MRN: [id number]  PRIMARY CARE PROVIDER: Seward Carol, MD  RESPONSIBLE PARTY: Sheryn Bison (son) Acct ID - Guarantor Home Phone Work Phone Relationship Acct Type  [id number] [place] [phone]  Self P/F     [place], [place], [place] [address]   Covid-19 Pre-screening  PLAN OF CARE and INTERVENTION:  1. ADVANCE CARE PLANNING/GOALS OF CARE: Goal is for patient to remain in his home. He has a DNR. 2. PATIENT/CAREGIVER EDUCATION: Symptom management, safe mobility/transfers, s/s of infection 3. DISEASE STATUS: Joint visit made with Palliative care MSW, [facility]. Met with patient, son and hired caregiver. Upon arrival, patient is sitting up in his wheelchair awake and alert. He is able to answer simple questions, follow commands and make his needs known. Patient denies pain at this time. Respirations are even, regular and unlabored. He does have a non-productive congested cough. Son says he is unable to expectorate phlegm. He has tried giving patient Mucinex but says it did not seem to help. Lungs are clear. Congestion mainly heard in upper chest near this throat. Son says that he is going to request an antibiotic from his PCP to see if this will help. He is afebrile. He had a recent CXR but it was clear per son. Son says he still wants to try patient on an antibiotic, but if it doesn't help his congestion/cough then he will just let it be. Patient has a hired caregiver through [employer]. He is also receiving PT/OT through [facility]. He requires assistance with standing, transfers, ambulation, bathing and dressing. He is able to feed himself independently. He is mainly transported via wheelchair. His intake is good. He is eating 2 meals/day and drinks Boost for nutritional supplementation. Son is concerned that patient may become dehydrated as he is not drinking much fluid. They are working on  pushing fluids. Son says that it seems that when patient does drink more, his congestion seems worse. Son asked me to look into patient's mouth to check for thrush, as in the past he required Nystatin. Patient has a thick white coating noted to his tongue but denies pain. Son says he will ask PCP for more Nystatin. He sleeps most of the day after meals.  He has an upcoming appointment with his Neurologist. He has edema noted to his bilateral lower extremities and feet. I recommended compression stockings to help with this. Will continue to monitor.  HISTORY OF PRESENT ILLNESS: This is a 87 yo male with a history of dementia, pAfib, CAD, moderate aortic stenosis, HTN, HLD and CKD stage 3. He was recently hospitalized from 08/13/20 to 08/17/20 d/t a syncope episode. Palliative care team continues to follow patient and will visit patient monthly and PRN.   CODE STATUS: DNR ADVANCED DIRECTIVES: Y MOST FORM: no PPS: 30%   PHYSICAL EXAM:   VITALS: Today's Vitals   10/05/20 1032  BP: (!) 165/90  Pulse: 64  Resp: 16  Temp: 97.6 F (36.4 C)  TempSrc: Temporal  SpO2: 100%  PainSc: 0-No pain    LUNGS: clear to auscultation  CARDIAC: Cor RRR EXTREMITIES:  edema noted to bilateral lower extremities 1+ SKIN: Exposed skin is dry and intact  NEURO: Alert and oriented to person/place, intermittent confusion, forgetful, generalized weakness, minimally ambulatory w/walker    (Duration of visit and documentation 60 minutes)   Daryl Eastern, RN BSN

## 2020-10-17 DIAGNOSIS — G629 Polyneuropathy, unspecified: Secondary | ICD-10-CM | POA: Diagnosis not present

## 2020-10-17 DIAGNOSIS — N1831 Chronic kidney disease, stage 3a: Secondary | ICD-10-CM | POA: Diagnosis not present

## 2020-10-17 DIAGNOSIS — F0390 Unspecified dementia without behavioral disturbance: Secondary | ICD-10-CM | POA: Diagnosis not present

## 2020-10-17 DIAGNOSIS — I129 Hypertensive chronic kidney disease with stage 1 through stage 4 chronic kidney disease, or unspecified chronic kidney disease: Secondary | ICD-10-CM | POA: Diagnosis not present

## 2020-10-17 DIAGNOSIS — S32592A Other specified fracture of left pubis, initial encounter for closed fracture: Secondary | ICD-10-CM | POA: Diagnosis not present

## 2020-10-17 DIAGNOSIS — S32512D Fracture of superior rim of left pubis, subsequent encounter for fracture with routine healing: Secondary | ICD-10-CM | POA: Diagnosis not present

## 2020-10-23 DIAGNOSIS — I129 Hypertensive chronic kidney disease with stage 1 through stage 4 chronic kidney disease, or unspecified chronic kidney disease: Secondary | ICD-10-CM | POA: Diagnosis not present

## 2020-10-23 DIAGNOSIS — S32512D Fracture of superior rim of left pubis, subsequent encounter for fracture with routine healing: Secondary | ICD-10-CM | POA: Diagnosis not present

## 2020-10-23 DIAGNOSIS — N1831 Chronic kidney disease, stage 3a: Secondary | ICD-10-CM | POA: Diagnosis not present

## 2020-10-23 DIAGNOSIS — F0390 Unspecified dementia without behavioral disturbance: Secondary | ICD-10-CM | POA: Diagnosis not present

## 2020-10-23 DIAGNOSIS — S32592A Other specified fracture of left pubis, initial encounter for closed fracture: Secondary | ICD-10-CM | POA: Diagnosis not present

## 2020-10-23 DIAGNOSIS — G629 Polyneuropathy, unspecified: Secondary | ICD-10-CM | POA: Diagnosis not present

## 2020-10-25 ENCOUNTER — Encounter (INDEPENDENT_AMBULATORY_CARE_PROVIDER_SITE_OTHER): Payer: Medicare Other | Admitting: Ophthalmology

## 2020-10-25 DIAGNOSIS — S32592A Other specified fracture of left pubis, initial encounter for closed fracture: Secondary | ICD-10-CM | POA: Diagnosis not present

## 2020-10-25 DIAGNOSIS — I129 Hypertensive chronic kidney disease with stage 1 through stage 4 chronic kidney disease, or unspecified chronic kidney disease: Secondary | ICD-10-CM | POA: Diagnosis not present

## 2020-10-25 DIAGNOSIS — G629 Polyneuropathy, unspecified: Secondary | ICD-10-CM | POA: Diagnosis not present

## 2020-10-25 DIAGNOSIS — S32512D Fracture of superior rim of left pubis, subsequent encounter for fracture with routine healing: Secondary | ICD-10-CM | POA: Diagnosis not present

## 2020-10-25 DIAGNOSIS — N1831 Chronic kidney disease, stage 3a: Secondary | ICD-10-CM | POA: Diagnosis not present

## 2020-10-25 DIAGNOSIS — F0390 Unspecified dementia without behavioral disturbance: Secondary | ICD-10-CM | POA: Diagnosis not present

## 2020-10-26 DIAGNOSIS — N1831 Chronic kidney disease, stage 3a: Secondary | ICD-10-CM | POA: Diagnosis not present

## 2020-10-26 DIAGNOSIS — S32512D Fracture of superior rim of left pubis, subsequent encounter for fracture with routine healing: Secondary | ICD-10-CM | POA: Diagnosis not present

## 2020-10-26 DIAGNOSIS — F0390 Unspecified dementia without behavioral disturbance: Secondary | ICD-10-CM | POA: Diagnosis not present

## 2020-10-26 DIAGNOSIS — S32592A Other specified fracture of left pubis, initial encounter for closed fracture: Secondary | ICD-10-CM | POA: Diagnosis not present

## 2020-10-26 DIAGNOSIS — I129 Hypertensive chronic kidney disease with stage 1 through stage 4 chronic kidney disease, or unspecified chronic kidney disease: Secondary | ICD-10-CM | POA: Diagnosis not present

## 2020-10-26 DIAGNOSIS — G629 Polyneuropathy, unspecified: Secondary | ICD-10-CM | POA: Diagnosis not present

## 2020-10-27 DIAGNOSIS — I129 Hypertensive chronic kidney disease with stage 1 through stage 4 chronic kidney disease, or unspecified chronic kidney disease: Secondary | ICD-10-CM | POA: Diagnosis not present

## 2020-10-27 DIAGNOSIS — F329 Major depressive disorder, single episode, unspecified: Secondary | ICD-10-CM | POA: Diagnosis not present

## 2020-10-27 DIAGNOSIS — Z8701 Personal history of pneumonia (recurrent): Secondary | ICD-10-CM | POA: Diagnosis not present

## 2020-10-27 DIAGNOSIS — Z7901 Long term (current) use of anticoagulants: Secondary | ICD-10-CM | POA: Diagnosis not present

## 2020-10-27 DIAGNOSIS — N400 Enlarged prostate without lower urinary tract symptoms: Secondary | ICD-10-CM | POA: Diagnosis not present

## 2020-10-27 DIAGNOSIS — F0390 Unspecified dementia without behavioral disturbance: Secondary | ICD-10-CM | POA: Diagnosis not present

## 2020-10-27 DIAGNOSIS — K219 Gastro-esophageal reflux disease without esophagitis: Secondary | ICD-10-CM | POA: Diagnosis not present

## 2020-10-27 DIAGNOSIS — S32512D Fracture of superior rim of left pubis, subsequent encounter for fracture with routine healing: Secondary | ICD-10-CM | POA: Diagnosis not present

## 2020-10-27 DIAGNOSIS — G4733 Obstructive sleep apnea (adult) (pediatric): Secondary | ICD-10-CM | POA: Diagnosis not present

## 2020-10-27 DIAGNOSIS — Z8673 Personal history of transient ischemic attack (TIA), and cerebral infarction without residual deficits: Secondary | ICD-10-CM | POA: Diagnosis not present

## 2020-10-27 DIAGNOSIS — I2510 Atherosclerotic heart disease of native coronary artery without angina pectoris: Secondary | ICD-10-CM | POA: Diagnosis not present

## 2020-10-27 DIAGNOSIS — N1831 Chronic kidney disease, stage 3a: Secondary | ICD-10-CM | POA: Diagnosis not present

## 2020-10-27 DIAGNOSIS — G629 Polyneuropathy, unspecified: Secondary | ICD-10-CM | POA: Diagnosis not present

## 2020-10-27 DIAGNOSIS — I700 Atherosclerosis of aorta: Secondary | ICD-10-CM | POA: Diagnosis not present

## 2020-10-27 DIAGNOSIS — I350 Nonrheumatic aortic (valve) stenosis: Secondary | ICD-10-CM | POA: Diagnosis not present

## 2020-10-27 DIAGNOSIS — E440 Moderate protein-calorie malnutrition: Secondary | ICD-10-CM | POA: Diagnosis not present

## 2020-10-27 DIAGNOSIS — E785 Hyperlipidemia, unspecified: Secondary | ICD-10-CM | POA: Diagnosis not present

## 2020-10-27 DIAGNOSIS — S32592A Other specified fracture of left pubis, initial encounter for closed fracture: Secondary | ICD-10-CM | POA: Diagnosis not present

## 2020-10-27 DIAGNOSIS — I252 Old myocardial infarction: Secondary | ICD-10-CM | POA: Diagnosis not present

## 2020-10-27 DIAGNOSIS — Z9181 History of falling: Secondary | ICD-10-CM | POA: Diagnosis not present

## 2020-10-27 DIAGNOSIS — G40909 Epilepsy, unspecified, not intractable, without status epilepticus: Secondary | ICD-10-CM | POA: Diagnosis not present

## 2020-10-27 DIAGNOSIS — I480 Paroxysmal atrial fibrillation: Secondary | ICD-10-CM | POA: Diagnosis not present

## 2020-10-29 ENCOUNTER — Telehealth: Payer: Self-pay | Admitting: *Deleted

## 2020-10-29 DIAGNOSIS — N1831 Chronic kidney disease, stage 3a: Secondary | ICD-10-CM | POA: Diagnosis not present

## 2020-10-29 DIAGNOSIS — G629 Polyneuropathy, unspecified: Secondary | ICD-10-CM | POA: Diagnosis not present

## 2020-10-29 DIAGNOSIS — S32592A Other specified fracture of left pubis, initial encounter for closed fracture: Secondary | ICD-10-CM | POA: Diagnosis not present

## 2020-10-29 DIAGNOSIS — S32512D Fracture of superior rim of left pubis, subsequent encounter for fracture with routine healing: Secondary | ICD-10-CM | POA: Diagnosis not present

## 2020-10-29 DIAGNOSIS — I129 Hypertensive chronic kidney disease with stage 1 through stage 4 chronic kidney disease, or unspecified chronic kidney disease: Secondary | ICD-10-CM | POA: Diagnosis not present

## 2020-10-29 DIAGNOSIS — F0390 Unspecified dementia without behavioral disturbance: Secondary | ICD-10-CM | POA: Diagnosis not present

## 2020-10-29 NOTE — Telephone Encounter (Signed)
Called and spoke with patient's son Rontrell Moquin to schedule a palliative care visit. Visit scheduled for 2/24@10 :30a.

## 2020-10-31 DIAGNOSIS — S32592A Other specified fracture of left pubis, initial encounter for closed fracture: Secondary | ICD-10-CM | POA: Diagnosis not present

## 2020-10-31 DIAGNOSIS — S32512D Fracture of superior rim of left pubis, subsequent encounter for fracture with routine healing: Secondary | ICD-10-CM | POA: Diagnosis not present

## 2020-10-31 DIAGNOSIS — G629 Polyneuropathy, unspecified: Secondary | ICD-10-CM | POA: Diagnosis not present

## 2020-10-31 DIAGNOSIS — N1831 Chronic kidney disease, stage 3a: Secondary | ICD-10-CM | POA: Diagnosis not present

## 2020-10-31 DIAGNOSIS — I129 Hypertensive chronic kidney disease with stage 1 through stage 4 chronic kidney disease, or unspecified chronic kidney disease: Secondary | ICD-10-CM | POA: Diagnosis not present

## 2020-10-31 DIAGNOSIS — F0390 Unspecified dementia without behavioral disturbance: Secondary | ICD-10-CM | POA: Diagnosis not present

## 2020-11-01 ENCOUNTER — Other Ambulatory Visit: Payer: Self-pay

## 2020-11-01 ENCOUNTER — Other Ambulatory Visit: Payer: Medicare Other | Admitting: *Deleted

## 2020-11-01 VITALS — BP 149/74 | HR 61 | Temp 97.7°F | Resp 17

## 2020-11-01 DIAGNOSIS — Z515 Encounter for palliative care: Secondary | ICD-10-CM

## 2020-11-01 NOTE — Progress Notes (Signed)
COMMUNITY PALLIATIVE CARE RN NOTE  PATIENT NAME: Derek [name] DOB: [birth year] MRN: [id number]  PRIMARY CARE PROVIDER: Seward Carol, MD  RESPONSIBLE PARTY: Sheryn Bison. (son) Acct ID - Guarantor Home Phone Work Phone Relationship Acct Type  [id number] [place] [phone]  Self P/F     [address] [place], [place] [address]   Covid-19 Pre-screening Negative  PLAN OF CARE and INTERVENTION:  1. ADVANCE CARE PLANNING/GOALS OF CARE: Goal is for patient to remain in his home and avoid hospitalizations. He has a DNR. 2. PATIENT/CAREGIVER EDUCATION: Symptom management, safe mobility/transfers, s/s of infection 3. DISEASE STATUS: Met with patient, son and hired caregiver in his home. Upon arrival, he is sitting up in his wheelchair at the dining table. He is awake and alert, about to eat breakfast and take his am medications. He is able to answer simple questions. He denies pain. He continues with an intermittent congested cough. Son says that it occurs during meals, but can also occur with him just sitting in the recliner. His breathing is regular and unlabored. He was seen by a home health Speech therapist who did a swallow test and is recommending a Barrium swallow. ST made PCP aware. Son is awaiting call from [facility] health with date/time of test. He is also still receiving PT/OT through [facility]. He is getting stronger. Son states patient was up last night by himself. He had walked into the kitchen to get a Boost drink and candy. He reinforces to him to make sure he is using his walker to prevent falls. He requires assistance with bathing, dressing and toileting. He is able to feed himself independently. He is also transported via wheelchair at times. His appetite is good. They continue to push fluids. He is incontinent of both bowel and bladder and wears Depends. He continues with a hired caregiver through [employer]. He also continues with swelling in both  legs/feet, however they appear less swollen than on my visit a month ago. They are keeping his legs elevated while he is in his recliner during the day to help. He is sleeping well during the night and takes a nap after breakfast for several hours daily. He has an appointment with his Neurologist next week. Will continue to monitor.  HISTORY OF PRESENT ILLNESS: This is a 86 yo male with a history of dementia, pAfib, CAD, moderate aortic stenosis, HTN, HLD and CKD stage 3.  Palliative care team continues to follow patient and will visit patient monthly and PRN.   CODE STATUS: DNR  ADVANCED DIRECTIVES: Y MOST FORM: no PPS: 40%   PHYSICAL EXAM:   VITALS: Today's Vitals   11/01/20 1050  BP: (!) 149/74  Pulse: 61  Resp: 17  Temp: 97.7 F (36.5 C)  TempSrc: Temporal  SpO2: 99%  PainSc: 0-No pain    LUNGS: clear to auscultation  CARDIAC: Cor RRR EXTREMITIES: Trace edema to bilateral lower extremities SKIN: Exposed skin is dry and intact; No skin issues  NEURO: Alert and oriented to person/place, pleasant mood, intermittent confusion, generalized weakness, ambulatory w/walker   (Duration of visit and documentation 45 minutes)   Daryl Eastern, RN BSN

## 2020-11-02 ENCOUNTER — Telehealth (HOSPITAL_COMMUNITY): Payer: Self-pay

## 2020-11-02 ENCOUNTER — Other Ambulatory Visit (HOSPITAL_COMMUNITY): Payer: Self-pay

## 2020-11-02 DIAGNOSIS — R1310 Dysphagia, unspecified: Secondary | ICD-10-CM

## 2020-11-07 DIAGNOSIS — S32592A Other specified fracture of left pubis, initial encounter for closed fracture: Secondary | ICD-10-CM | POA: Diagnosis not present

## 2020-11-07 DIAGNOSIS — F0390 Unspecified dementia without behavioral disturbance: Secondary | ICD-10-CM | POA: Diagnosis not present

## 2020-11-07 DIAGNOSIS — I129 Hypertensive chronic kidney disease with stage 1 through stage 4 chronic kidney disease, or unspecified chronic kidney disease: Secondary | ICD-10-CM | POA: Diagnosis not present

## 2020-11-07 DIAGNOSIS — G629 Polyneuropathy, unspecified: Secondary | ICD-10-CM | POA: Diagnosis not present

## 2020-11-07 DIAGNOSIS — S32512D Fracture of superior rim of left pubis, subsequent encounter for fracture with routine healing: Secondary | ICD-10-CM | POA: Diagnosis not present

## 2020-11-07 DIAGNOSIS — N1831 Chronic kidney disease, stage 3a: Secondary | ICD-10-CM | POA: Diagnosis not present

## 2020-11-08 ENCOUNTER — Encounter: Payer: Self-pay | Admitting: Neurology

## 2020-11-08 ENCOUNTER — Other Ambulatory Visit: Payer: Self-pay

## 2020-11-08 ENCOUNTER — Ambulatory Visit (INDEPENDENT_AMBULATORY_CARE_PROVIDER_SITE_OTHER): Payer: Medicare Other | Admitting: Neurology

## 2020-11-08 VITALS — BP 170/74 | HR 58 | Ht 67.0 in | Wt 178.6 lb

## 2020-11-08 DIAGNOSIS — R55 Syncope and collapse: Secondary | ICD-10-CM

## 2020-11-08 DIAGNOSIS — I129 Hypertensive chronic kidney disease with stage 1 through stage 4 chronic kidney disease, or unspecified chronic kidney disease: Secondary | ICD-10-CM | POA: Diagnosis not present

## 2020-11-08 DIAGNOSIS — F0280 Dementia in other diseases classified elsewhere without behavioral disturbance: Secondary | ICD-10-CM

## 2020-11-08 DIAGNOSIS — N1831 Chronic kidney disease, stage 3a: Secondary | ICD-10-CM | POA: Diagnosis not present

## 2020-11-08 DIAGNOSIS — G309 Alzheimer's disease, unspecified: Secondary | ICD-10-CM | POA: Diagnosis not present

## 2020-11-08 DIAGNOSIS — G629 Polyneuropathy, unspecified: Secondary | ICD-10-CM | POA: Diagnosis not present

## 2020-11-08 DIAGNOSIS — R269 Unspecified abnormalities of gait and mobility: Secondary | ICD-10-CM | POA: Diagnosis not present

## 2020-11-08 DIAGNOSIS — S32512D Fracture of superior rim of left pubis, subsequent encounter for fracture with routine healing: Secondary | ICD-10-CM | POA: Diagnosis not present

## 2020-11-08 DIAGNOSIS — F0390 Unspecified dementia without behavioral disturbance: Secondary | ICD-10-CM | POA: Diagnosis not present

## 2020-11-08 DIAGNOSIS — S32592A Other specified fracture of left pubis, initial encounter for closed fracture: Secondary | ICD-10-CM | POA: Diagnosis not present

## 2020-11-08 MED ORDER — APIXABAN 2.5 MG PO TABS: 2.5000 mg | ORAL_TABLET | Freq: Two times a day (BID) | ORAL | 1 refills | Status: DC

## 2020-11-08 MED ORDER — DONEPEZIL HCL 10 MG PO TABS: 5.0000 mg | ORAL_TABLET | Freq: Every day | ORAL | 3 refills | Status: DC

## 2020-11-08 NOTE — Progress Notes (Signed)
Reason for visit: Dementia, gait disorder  Derek [name] is an 87 y.o. male  History of present illness:  Derek [name] is a 87 year old right-handed white male with a history of a progressive dementing illness.  He has a history of atrial fibrillation and he has cerebrovascular disease with extensive white matter changes by MRI.  The patient has coronary artery disease, aortic stenosis, hypertension, dyslipidemia, chronic renal insufficiency, and he is on chronic anticoagulation.  The patient has had syncope events in the past, he may have events that are postprandial in nature.  If he eats a large meal he may become syncopal.  He also may drop his blood pressure if he is tired or has been walking for extended periods of time.  The patient is living at home with his son.  He has had some gradual worsening of his dementia, but overall he has improved with his functional level since the admission to the hospital on 13 August 2020.  He had a significant rapid decline in functional level at that time with slurred speech and difficulty walking.  He is currently getting physical, occupational, and speech therapy.  He will be going for a barium swallow soon as he is coughing with eating.  He has some leg swelling as well, he is on amlodipine.  He may use a walker for ambulation, but he needs some assistance with walking as well.  The patient is being followed through palliative care.  He returns for an evaluation.  He tends to run a pulse in the mid 50s, he is on metoprolol as well.  Past Medical History:  Diagnosis Date  . Broken shoulder   . Cerebrovascular disease    MRI/MRA in 2007 - 50% stenosis of supraclinoid ICA, left  . Clavicle fracture   . Closed wedge compression fracture of T1 vertebra ([facility]) 02/20/2018  . Complex sleep apnea syndrome    adapt SV titration EEP 6 min 6 and max 15 cm water.   . Compression fracture   . Coronary atherosclerosis of native coronary artery 07/12/2013   a.  NSTEMI in setting of AF with RVR 11/14 >> BMS to CFX;  b. Nuclear (9/15):  High risk - apical lateral ischemia >> c. LHC (9/15):  pLAD 50%, D1 (smaller br 90%/larger br 80%, pDx 50-60%, dLAD 50%, prox-mid CFX stent ok, pOM1 and OM2 50-70% (jailed by stent), OM5 70-80%, mPDA 90% (CFX dsz unchanged from 2014), RCA diff dsz, EF 55%, mild ant-apical HK >>  Med Rx  . CVA (cerebral vascular accident) ([facility])   . Dementia ([facility])   . History of pneumonia   . Hx of echocardiogram    a. Echo 11/14): EF 55-60%, no RWMA, grade 2 DD, mild aortic stenosis (mean 15 mmHg), mild LAE  . Hypertension   . Lung nodule < 6cm on CT 02/20/2018  . MI (myocardial infarction) ([facility]) 07/2013  . PAF (paroxysmal atrial fibrillation) ([facility])    hx of NSTEMI in setting of AF with RVR 07/2013;  Eliquis for AC  . Shingles    left chest  . Syncope   . TIA (transient ischemic attack) 07/12/2013    Past Surgical History:  Procedure Laterality Date  . CATARACT EXTRACTION Bilateral   . KYPHOPLASTY    . LEFT HEART CATHETERIZATION WITH CORONARY ANGIOGRAM N/A 08/08/2013   Procedure: LEFT HEART CATHETERIZATION WITH CORONARY ANGIOGRAM;  Surgeon: Jettie Booze, MD;  Location: [facility] CATH LAB;  Service: Cardiovascular;  Laterality: N/A;  . LEFT  HEART CATHETERIZATION WITH CORONARY ANGIOGRAM N/A 05/29/2014   Procedure: LEFT HEART CATHETERIZATION WITH CORONARY ANGIOGRAM;  Surgeon: Sinclair Grooms, MD;  Location: [facility] CATH LAB;  Service: Cardiovascular;  Laterality: N/A;  . PERCUTANEOUS STENT INTERVENTION  08/08/2013   Procedure: PERCUTANEOUS STENT INTERVENTION;  Surgeon: Jettie Booze, MD;  Location: [facility] C[facility] LAB;  Service: Cardiovascular;;  . SHOULDER SURGERY Left   . TONSILLECTOMY      Family History  Problem Relation Age of Onset  . Diabetes Mother   . Diabetes Father   . Cancer - Lung Brother   . Gait disorder Brother   . Cancer Daughter   . Stroke Sister   . Heart attack Neg Hx     Social history:  reports that he has  never smoked. He has never used smokeless tobacco. He reports current alcohol use of about 1.0 standard drink of alcohol per week. He reports that he does not use drugs.    Allergies  Allergen Reactions  . Augmentin [Amoxicillin-Pot Clavulanate] Rash  . Keppra [Levetiracetam]     Trouble with walking, fatigued  . Penicillins Rash    Has patient had a PCN reaction causing immediate rash, facial/tongue/throat swelling, SOB or lightheadedness with hypotension: Yes Has patient had a PCN reaction causing severe rash involving mucus membranes or skin necrosis: Unk Has patient had a PCN reaction that required hospitalization: Yes Has patient had a PCN reaction occurring within the last 10 years: No If all of the above answers are "NO", then may proceed with Cephalosporin use.   . Sulfa Antibiotics Rash    Medications:  Prior to Admission medications   Medication Sig Start Date End Date Taking? Authorizing Provider  atorvastatin (LIPITOR) 40 MG tablet TAKE 1 TABLET ONCE DAILY AT 6 PM. 08/21/20   Belva Crome, MD  cholecalciferol (VITAMIN D) 1000 UNITS tablet Take 1,000 Units by mouth daily.    [provider]  donepezil (ARICEPT) 10 MG tablet Take 1 tablet (10 mg total) by mouth at bedtime. 12/28/19   Suzzanne Cloud, NP  ELIQUIS 2.5 MG TABS tablet TAKE 1 TABLET BY MOUTH TWICE DAILY. Patient taking differently: Take 2.5 mg by mouth 2 (two) times daily.  05/28/20   Belva Crome, MD  metoprolol tartrate (LOPRESSOR) 25 MG tablet TAKE 1 TABLET BY MOUTH TWICE DAILY. 08/21/20   Belva Crome, MD  Multiple Vitamins-Minerals (CENTRUM SILVER PO) Take 1 tablet by mouth daily.     [provider]  Multiple Vitamins-Minerals (PRESERVISION AREDS 2+MULTI VIT) CAPS Take 1 capsule by mouth in the morning and at bedtime.    [provider]  nitroGLYCERIN (NITROSTAT) 0.4 MG SL tablet Place 1 tablet (0.4 mg total) under the tongue every 5 (five) minutes as needed for chest  pain. Patient taking differently: Place 0.4 mg under the tongue every 5 (five) minutes as needed for chest pain (max 3 doses).  04/06/20   Belva Crome, MD  nystatin (MYCOSTATIN) 100000 UNIT/ML suspension Take 5 mLs (500,000 Units total) by mouth 4 (four) times daily. 08/17/20   Samuella Cota, MD  omeprazole (PRILOSEC) 20 MG capsule Take 20 mg by mouth daily before breakfast.     [provider]    ROS:  Out of a complete 14 system review of symptoms, the patient complains only of the following symptoms, and all other reviewed systems are negative.  Memory problem Walking difficulty Syncope  Blood pressure (!) 170/74, pulse (!) 58, height 5\' 7"  (  1.702 m), weight 178 lb 9.6 oz (81 kg).  Physical Exam  General: The patient is alert and cooperative at the time of the examination.  Skin: No significant peripheral edema is noted.   Neurologic Exam  Mental status: The patient is alert and oriented x 2 at the time of the examination (not oriented to date).   Cranial nerves: Facial symmetry is present. Speech is normal, no aphasia or dysarthria is noted. Extraocular movements are full. Visual fields are full.  Motor: The patient has good strength in all 4 extremities.  Sensory examination: Soft touch sensation is symmetric on the face, arms, and legs.  Coordination: The patient has good finger-nose-finger and heel-to-shin bilaterally.  Gait and station: The patient has a normal gait. Tandem gait is normal. Romberg is negative. No drift is seen.  Reflexes: Deep tendon reflexes are symmetric.   MRI brain 08/16/20:  IMPRESSION: No acute intracranial abnormality.  Advanced chronic small vessel disease appears stable since 2019 with no new intracranial abnormality.   Assessment/Plan:  1.  Progressive memory disturbance  2.  Gait disturbance  3.  History of syncope, bradycardia  Given the ongoing problems with the bradycardia, we will reduce the Aricept to 5  mg in the evening.  He will follow up here in 6 months.  The patient is currently getting 24/7 assistance at home.  Jill Alexanders MD 11/08/2020 7:43 AM  [facility] [address] [place], [place] [address]  Phone [phone] Fax [phone]

## 2020-11-08 NOTE — Patient Instructions (Signed)
Reduce the aricept (donepzil) to 1/2 tablet at night.

## 2020-11-09 ENCOUNTER — Ambulatory Visit (HOSPITAL_COMMUNITY)
Admission: RE | Admit: 2020-11-09 | Discharge: 2020-11-09 | Disposition: A | Discharge status: Home / Self Care | Payer: Medicare Other | Source: Ambulatory Visit | Attending: Internal Medicine | Admitting: Internal Medicine

## 2020-11-09 ENCOUNTER — Other Ambulatory Visit: Payer: Self-pay

## 2020-11-09 DIAGNOSIS — I129 Hypertensive chronic kidney disease with stage 1 through stage 4 chronic kidney disease, or unspecified chronic kidney disease: Secondary | ICD-10-CM | POA: Diagnosis not present

## 2020-11-09 DIAGNOSIS — R1312 Dysphagia, oropharyngeal phase: Secondary | ICD-10-CM | POA: Diagnosis not present

## 2020-11-09 DIAGNOSIS — F0390 Unspecified dementia without behavioral disturbance: Secondary | ICD-10-CM | POA: Diagnosis not present

## 2020-11-09 DIAGNOSIS — G629 Polyneuropathy, unspecified: Secondary | ICD-10-CM | POA: Diagnosis not present

## 2020-11-09 DIAGNOSIS — R1310 Dysphagia, unspecified: Secondary | ICD-10-CM | POA: Diagnosis not present

## 2020-11-09 DIAGNOSIS — K219 Gastro-esophageal reflux disease without esophagitis: Secondary | ICD-10-CM | POA: Diagnosis not present

## 2020-11-09 DIAGNOSIS — S32512D Fracture of superior rim of left pubis, subsequent encounter for fracture with routine healing: Secondary | ICD-10-CM | POA: Diagnosis not present

## 2020-11-09 DIAGNOSIS — N1831 Chronic kidney disease, stage 3a: Secondary | ICD-10-CM | POA: Diagnosis not present

## 2020-11-09 DIAGNOSIS — S32592A Other specified fracture of left pubis, initial encounter for closed fracture: Secondary | ICD-10-CM | POA: Diagnosis not present

## 2020-11-13 DIAGNOSIS — S32592A Other specified fracture of left pubis, initial encounter for closed fracture: Secondary | ICD-10-CM | POA: Diagnosis not present

## 2020-11-13 DIAGNOSIS — I129 Hypertensive chronic kidney disease with stage 1 through stage 4 chronic kidney disease, or unspecified chronic kidney disease: Secondary | ICD-10-CM | POA: Diagnosis not present

## 2020-11-13 DIAGNOSIS — F0390 Unspecified dementia without behavioral disturbance: Secondary | ICD-10-CM | POA: Diagnosis not present

## 2020-11-13 DIAGNOSIS — S32512D Fracture of superior rim of left pubis, subsequent encounter for fracture with routine healing: Secondary | ICD-10-CM | POA: Diagnosis not present

## 2020-11-13 DIAGNOSIS — N1831 Chronic kidney disease, stage 3a: Secondary | ICD-10-CM | POA: Diagnosis not present

## 2020-11-13 DIAGNOSIS — G629 Polyneuropathy, unspecified: Secondary | ICD-10-CM | POA: Diagnosis not present

## 2020-11-14 DIAGNOSIS — S32592A Other specified fracture of left pubis, initial encounter for closed fracture: Secondary | ICD-10-CM | POA: Diagnosis not present

## 2020-11-14 DIAGNOSIS — F0390 Unspecified dementia without behavioral disturbance: Secondary | ICD-10-CM | POA: Diagnosis not present

## 2020-11-14 DIAGNOSIS — N1831 Chronic kidney disease, stage 3a: Secondary | ICD-10-CM | POA: Diagnosis not present

## 2020-11-14 DIAGNOSIS — S32512D Fracture of superior rim of left pubis, subsequent encounter for fracture with routine healing: Secondary | ICD-10-CM | POA: Diagnosis not present

## 2020-11-14 DIAGNOSIS — G629 Polyneuropathy, unspecified: Secondary | ICD-10-CM | POA: Diagnosis not present

## 2020-11-14 DIAGNOSIS — I129 Hypertensive chronic kidney disease with stage 1 through stage 4 chronic kidney disease, or unspecified chronic kidney disease: Secondary | ICD-10-CM | POA: Diagnosis not present

## 2020-11-19 DIAGNOSIS — F0390 Unspecified dementia without behavioral disturbance: Secondary | ICD-10-CM | POA: Diagnosis not present

## 2020-11-19 DIAGNOSIS — S32592A Other specified fracture of left pubis, initial encounter for closed fracture: Secondary | ICD-10-CM | POA: Diagnosis not present

## 2020-11-19 DIAGNOSIS — G629 Polyneuropathy, unspecified: Secondary | ICD-10-CM | POA: Diagnosis not present

## 2020-11-19 DIAGNOSIS — S32512D Fracture of superior rim of left pubis, subsequent encounter for fracture with routine healing: Secondary | ICD-10-CM | POA: Diagnosis not present

## 2020-11-19 DIAGNOSIS — N1831 Chronic kidney disease, stage 3a: Secondary | ICD-10-CM | POA: Diagnosis not present

## 2020-11-19 DIAGNOSIS — I129 Hypertensive chronic kidney disease with stage 1 through stage 4 chronic kidney disease, or unspecified chronic kidney disease: Secondary | ICD-10-CM | POA: Diagnosis not present

## 2020-11-21 DIAGNOSIS — S32592A Other specified fracture of left pubis, initial encounter for closed fracture: Secondary | ICD-10-CM | POA: Diagnosis not present

## 2020-11-21 DIAGNOSIS — N1831 Chronic kidney disease, stage 3a: Secondary | ICD-10-CM | POA: Diagnosis not present

## 2020-11-21 DIAGNOSIS — F0390 Unspecified dementia without behavioral disturbance: Secondary | ICD-10-CM | POA: Diagnosis not present

## 2020-11-21 DIAGNOSIS — I129 Hypertensive chronic kidney disease with stage 1 through stage 4 chronic kidney disease, or unspecified chronic kidney disease: Secondary | ICD-10-CM | POA: Diagnosis not present

## 2020-11-21 DIAGNOSIS — G629 Polyneuropathy, unspecified: Secondary | ICD-10-CM | POA: Diagnosis not present

## 2020-11-21 DIAGNOSIS — S32512D Fracture of superior rim of left pubis, subsequent encounter for fracture with routine healing: Secondary | ICD-10-CM | POA: Diagnosis not present

## 2020-12-03 ENCOUNTER — Other Ambulatory Visit: Payer: Self-pay | Admitting: Interventional Cardiology

## 2020-12-03 NOTE — Telephone Encounter (Signed)
Pt last saw Dr Tamala Julian 04/06/20, last labs 08/15/20 Creat 1.12, age 87, weight 81kg, based on specified criteria pt is not on appropriate dosage of Eliquis. Pt is currently on Eliquis 2.5mg  BID, but qualifies for a dosage increase.   Looking at most recent serum creatinines they have remained <1.5 since 06/2020, last creat >1.5 was 01/31/20 were it was exactly 1.5.  Has been checked multiple times since than and it has remained less than 1.5.   Please advise if dosage should be increased to Elqiuis 5mg  BID. Thanks! Dose was reduced on 09/26/19 phone note given elevated Creat at that time.   Msg sent to Dr Tamala Julian will await his response to refill.

## 2020-12-04 NOTE — Telephone Encounter (Signed)
-----   Message from Belva Crome, MD sent at 12/04/2020  9:10 AM EDT ----- He faints with associated injury. Leave the Eliquis at lower dose. ----- Message ----- From: Brynda Peon, RN Sent: 12/03/2020   1:36 PM EDT To: Belva Crome, MD  Pt last saw Dr Tamala Julian 04/06/20, last labs 08/15/20 Creat 1.12, age 87, weight 81kg, based on specified criteria pt is not on appropriate dosage of Eliquis. Pt is currently on Eliquis 2.5mg  BID, but qualifies for a dosage increase.   Looking at most recent serum creatinines they have remained <1.5 since 06/2020, last creat >1.5 was 01/31/20 were it was exactly 1.5.  Has been checked multiple times since than and it has remained less than 1.5.   Please advise if dosage should be increased to Elqiuis 5mg  BID. Thanks! Dose was reduced on 09/26/19 phone note given elevated Creat at that time.

## 2020-12-04 NOTE — Telephone Encounter (Signed)
Will refill Eliquis 2.5mg  BID per Dr Tamala Julian, pt faints with associated injury, so he wants to continue Eliquis at lower dosage 2.5mg  BID.

## 2020-12-05 ENCOUNTER — Other Ambulatory Visit: Payer: Medicare Other

## 2020-12-05 ENCOUNTER — Other Ambulatory Visit: Payer: Self-pay

## 2020-12-05 DIAGNOSIS — Z515 Encounter for palliative care: Secondary | ICD-10-CM

## 2020-12-06 ENCOUNTER — Emergency Department (HOSPITAL_COMMUNITY): Payer: Medicare Other

## 2020-12-06 ENCOUNTER — Inpatient Hospital Stay (HOSPITAL_COMMUNITY)
Admission: EM | Admit: 2020-12-06 | Discharge: 2020-12-08 | DRG: 312 | Disposition: A | Discharge status: Home Health | Payer: Medicare Other | Attending: Family Medicine | Admitting: Family Medicine

## 2020-12-06 ENCOUNTER — Encounter (HOSPITAL_COMMUNITY): Payer: Self-pay

## 2020-12-06 ENCOUNTER — Observation Stay (HOSPITAL_COMMUNITY): Payer: Medicare Other

## 2020-12-06 ENCOUNTER — Other Ambulatory Visit: Payer: Self-pay

## 2020-12-06 DIAGNOSIS — F0390 Unspecified dementia without behavioral disturbance: Secondary | ICD-10-CM | POA: Diagnosis present

## 2020-12-06 DIAGNOSIS — R55 Syncope and collapse: Secondary | ICD-10-CM | POA: Diagnosis not present

## 2020-12-06 DIAGNOSIS — F0280 Dementia in other diseases classified elsewhere without behavioral disturbance: Secondary | ICD-10-CM | POA: Diagnosis not present

## 2020-12-06 DIAGNOSIS — I129 Hypertensive chronic kidney disease with stage 1 through stage 4 chronic kidney disease, or unspecified chronic kidney disease: Secondary | ICD-10-CM | POA: Diagnosis present

## 2020-12-06 DIAGNOSIS — M25561 Pain in right knee: Secondary | ICD-10-CM | POA: Diagnosis not present

## 2020-12-06 DIAGNOSIS — G4733 Obstructive sleep apnea (adult) (pediatric): Secondary | ICD-10-CM | POA: Diagnosis present

## 2020-12-06 DIAGNOSIS — I350 Nonrheumatic aortic (valve) stenosis: Secondary | ICD-10-CM | POA: Diagnosis present

## 2020-12-06 DIAGNOSIS — N179 Acute kidney failure, unspecified: Secondary | ICD-10-CM | POA: Diagnosis present

## 2020-12-06 DIAGNOSIS — Z79899 Other long term (current) drug therapy: Secondary | ICD-10-CM

## 2020-12-06 DIAGNOSIS — R000 Tachycardia, unspecified: Secondary | ICD-10-CM | POA: Diagnosis not present

## 2020-12-06 DIAGNOSIS — Z7901 Long term (current) use of anticoagulants: Secondary | ICD-10-CM

## 2020-12-06 DIAGNOSIS — I2510 Atherosclerotic heart disease of native coronary artery without angina pectoris: Secondary | ICD-10-CM | POA: Diagnosis present

## 2020-12-06 DIAGNOSIS — I10 Essential (primary) hypertension: Secondary | ICD-10-CM | POA: Diagnosis not present

## 2020-12-06 DIAGNOSIS — Z801 Family history of malignant neoplasm of trachea, bronchus and lung: Secondary | ICD-10-CM

## 2020-12-06 DIAGNOSIS — N1831 Chronic kidney disease, stage 3a: Secondary | ICD-10-CM | POA: Diagnosis present

## 2020-12-06 DIAGNOSIS — Z20822 Contact with and (suspected) exposure to covid-19: Secondary | ICD-10-CM | POA: Diagnosis not present

## 2020-12-06 DIAGNOSIS — I252 Old myocardial infarction: Secondary | ICD-10-CM

## 2020-12-06 DIAGNOSIS — Z66 Do not resuscitate: Secondary | ICD-10-CM | POA: Diagnosis not present

## 2020-12-06 DIAGNOSIS — Z833 Family history of diabetes mellitus: Secondary | ICD-10-CM

## 2020-12-06 DIAGNOSIS — Z880 Allergy status to penicillin: Secondary | ICD-10-CM

## 2020-12-06 DIAGNOSIS — K219 Gastro-esophageal reflux disease without esophagitis: Secondary | ICD-10-CM | POA: Diagnosis present

## 2020-12-06 DIAGNOSIS — G309 Alzheimer's disease, unspecified: Secondary | ICD-10-CM | POA: Diagnosis not present

## 2020-12-06 DIAGNOSIS — G4730 Sleep apnea, unspecified: Secondary | ICD-10-CM | POA: Diagnosis present

## 2020-12-06 DIAGNOSIS — M25461 Effusion, right knee: Secondary | ICD-10-CM | POA: Diagnosis present

## 2020-12-06 DIAGNOSIS — R1313 Dysphagia, pharyngeal phase: Secondary | ICD-10-CM | POA: Diagnosis present

## 2020-12-06 DIAGNOSIS — Z955 Presence of coronary angioplasty implant and graft: Secondary | ICD-10-CM

## 2020-12-06 DIAGNOSIS — Z823 Family history of stroke: Secondary | ICD-10-CM

## 2020-12-06 DIAGNOSIS — I480 Paroxysmal atrial fibrillation: Secondary | ICD-10-CM | POA: Diagnosis present

## 2020-12-06 DIAGNOSIS — J449 Chronic obstructive pulmonary disease, unspecified: Secondary | ICD-10-CM | POA: Diagnosis present

## 2020-12-06 DIAGNOSIS — M25562 Pain in left knee: Secondary | ICD-10-CM | POA: Diagnosis not present

## 2020-12-06 DIAGNOSIS — Z888 Allergy status to other drugs, medicaments and biological substances status: Secondary | ICD-10-CM

## 2020-12-06 DIAGNOSIS — R040 Epistaxis: Secondary | ICD-10-CM | POA: Diagnosis present

## 2020-12-06 DIAGNOSIS — E860 Dehydration: Secondary | ICD-10-CM | POA: Diagnosis present

## 2020-12-06 DIAGNOSIS — R0902 Hypoxemia: Secondary | ICD-10-CM | POA: Diagnosis not present

## 2020-12-06 DIAGNOSIS — Z8673 Personal history of transient ischemic attack (TIA), and cerebral infarction without residual deficits: Secondary | ICD-10-CM

## 2020-12-06 HISTORY — DX: Other amnesia: R41.3

## 2020-12-06 LAB — COMPREHENSIVE METABOLIC PANEL
ALT: 19 U/L (ref 0–44)
AST: 25 U/L (ref 15–41)
Albumin: 3.6 g/dL (ref 3.5–5.0)
Alkaline Phosphatase: 94 U/L (ref 38–126)
Anion gap: 8 (ref 5–15)
BUN: 34 mg/dL — ABNORMAL HIGH (ref 8–23)
CO2: 28 mmol/L (ref 22–32)
Calcium: 9.6 mg/dL (ref 8.9–10.3)
Chloride: 103 mmol/L (ref 98–111)
Creatinine, Ser: 1.49 mg/dL — ABNORMAL HIGH (ref 0.61–1.24)
GFR, Estimated: 44 mL/min — ABNORMAL LOW (ref >60)
Glucose, Bld: 127 mg/dL — ABNORMAL HIGH (ref 70–99)
Potassium: 4.2 mmol/L (ref 3.5–5.1)
Sodium: 139 mmol/L (ref 135–145)
Total Bilirubin: 0.7 mg/dL (ref 0.3–1.2)
Total Protein: 6.8 g/dL (ref 6.5–8.1)

## 2020-12-06 LAB — CBC WITH DIFFERENTIAL/PLATELET
Abs Immature Granulocytes: 0.04 10*3/uL (ref 0.00–0.07)
Basophils Absolute: 0 10*3/uL (ref 0.0–0.1)
Basophils Relative: 0 %
Eosinophils Absolute: 0.4 10*3/uL (ref 0.0–0.5)
Eosinophils Relative: 4 %
HCT: 42.4 % (ref 39.0–52.0)
Hemoglobin: 13.9 g/dL (ref 13.0–17.0)
Immature Granulocytes: 0 %
Lymphocytes Relative: 16 %
Lymphs Abs: 1.6 10*3/uL (ref 0.7–4.0)
MCH: 29.3 pg (ref 26.0–34.0)
MCHC: 32.8 g/dL (ref 30.0–36.0)
MCV: 89.5 fL (ref 80.0–100.0)
Monocytes Absolute: 0.9 10*3/uL (ref 0.1–1.0)
Monocytes Relative: 9 %
Neutro Abs: 7 10*3/uL (ref 1.7–7.7)
Neutrophils Relative %: 71 %
Platelets: 229 10*3/uL (ref 150–400)
RBC: 4.74 MIL/uL (ref 4.22–5.81)
RDW: 13.7 % (ref 11.5–15.5)
WBC: 9.9 10*3/uL (ref 4.0–10.5)
nRBC: 0 % (ref 0.0–0.2)

## 2020-12-06 LAB — RESP PANEL BY RT-PCR (FLU A&B, COVID) ARPGX2
Influenza A by PCR: NEGATIVE
Influenza B by PCR: NEGATIVE
SARS Coronavirus 2 by RT PCR: NEGATIVE

## 2020-12-06 LAB — TSH: TSH: 1.291 u[IU]/mL (ref 0.350–4.500)

## 2020-12-06 MED ORDER — NITROGLYCERIN 0.4 MG SL SUBL: 0.4000 mg | SUBLINGUAL_TABLET | SUBLINGUAL | Status: DC | PRN

## 2020-12-06 MED ORDER — PANTOPRAZOLE SODIUM 40 MG PO TBEC
40.0000 mg | DELAYED_RELEASE_TABLET | Freq: Every day | ORAL | Status: DC
  Administered 2020-12-07: 40 mg via ORAL
  Filled 2020-12-06 (×2): qty 1

## 2020-12-06 MED ORDER — ATORVASTATIN CALCIUM 40 MG PO TABS
40.0000 mg | ORAL_TABLET | Freq: Every day | ORAL | Status: DC
  Administered 2020-12-06 – 2020-12-07 (×2): 40 mg via ORAL
  Filled 2020-12-06 (×2): qty 1

## 2020-12-06 MED ORDER — METOPROLOL TARTRATE 25 MG PO TABS
25.0000 mg | ORAL_TABLET | Freq: Two times a day (BID) | ORAL | Status: DC
  Administered 2020-12-06 – 2020-12-08 (×4): 25 mg via ORAL
  Filled 2020-12-06 (×4): qty 1

## 2020-12-06 MED ORDER — HYDRALAZINE HCL 25 MG PO TABS
25.0000 mg | ORAL_TABLET | Freq: Four times a day (QID) | ORAL | Status: DC | PRN
Start: 2020-12-06 — End: 2020-12-07

## 2020-12-06 MED ORDER — SODIUM CHLORIDE 0.9 % IV BOLUS
1000.0000 mL | Freq: Once | INTRAVENOUS | Status: AC
  Administered 2020-12-06: 1000 mL via INTRAVENOUS

## 2020-12-06 MED ORDER — SODIUM CHLORIDE 0.45 % IV SOLN: INTRAVENOUS | Status: AC

## 2020-12-06 MED ORDER — DONEPEZIL HCL 5 MG PO TABS
5.0000 mg | ORAL_TABLET | Freq: Every day | ORAL | Status: DC
  Administered 2020-12-06 – 2020-12-07 (×2): 5 mg via ORAL
  Filled 2020-12-06 (×2): qty 1

## 2020-12-06 MED ORDER — SENNA 8.6 MG PO TABS
1.0000 | ORAL_TABLET | Freq: Two times a day (BID) | ORAL | Status: DC
  Administered 2020-12-06 – 2020-12-08 (×5): 8.6 mg via ORAL
  Filled 2020-12-06 (×5): qty 1

## 2020-12-06 MED ORDER — APIXABAN 2.5 MG PO TABS
2.5000 mg | ORAL_TABLET | Freq: Two times a day (BID) | ORAL | Status: DC
  Administered 2020-12-06 – 2020-12-08 (×4): 2.5 mg via ORAL
  Filled 2020-12-06 (×4): qty 1

## 2020-12-06 NOTE — ED Notes (Signed)
Pt HR 81 irregular sinus rhythm

## 2020-12-06 NOTE — ED Notes (Signed)
PO challenge tolerated well 3 oz of water

## 2020-12-06 NOTE — ED Triage Notes (Signed)
Pt. Bib ems from home. Pt. Was sitting at the table when his nose started bleeding. Then he slumped over but did not fall to floor. Pt. Has a hx of afib on eloquis. Notice he was weak 12/05/20 and today he has declined further. Pt has left knee pain. Pt. Has a hx of dementia and falls. It was stated cardiologist discontinued amlodipine but son started giving back amlodipine without consulting cardiologist. EMS stated son wants to cancel DNR. VS 163/81 CBG 168 O2 96% room air IV 18 g and 20 g left arm

## 2020-12-06 NOTE — ED Notes (Signed)
Pt. BP is trending up. Provider is aware.

## 2020-12-06 NOTE — ED Notes (Signed)
Patient transported to X-ray 

## 2020-12-06 NOTE — Progress Notes (Signed)
  Echocardiogram 2D Echocardiogram has been attempted. Patient OTF. Will reattempt at later date.  Randa Lynn Guinn Delarosa 12/06/2020, 3:43 PM

## 2020-12-06 NOTE — ED Notes (Signed)
Pt. Son is at the bedside

## 2020-12-06 NOTE — H&P (Signed)
History and Physical    Derek [name] JJK:[id number] DOB: September 17, 1926 DOA: 12/06/2020  PCP: Seward Carol, MD (Confirm with patient/family/NH records and if not entered, this has to be entered at [facility] point of entry) Patient coming from: Home  I have personally briefly reviewed patient's old medical records in [facility]  Chief Complaint: syncope with collapse, epistaxis  HPI: Derek [name] is a 87 y.o. male with medical history significant of CVD, PAF, Aortic stenosis, CAD s/p PCI, progressive dementia, dysphagia, syncope. This morning he had a witnessed nosebleed followed by syncope with collapse. Per the patient's son, who witnessed the event, he slumped over, became ashen and lost consciousness. He has also been having elevated blood pressures. EMS was activated and patient transported to [facility] for evaluation.    ED Course: T 98.4  BP 185/86, HR 85 RR 16.  ED-MD exam notable for swelling and pain right knee. No blood in nares. Non-focal neuro exam. CMet with glucose of 127  Cr 1.49 (1.12 Dec '21) Hgb 13.9. EKG SR with APCs w/o acute changes. TRH asked to admit for further evaluation and treatment of syncope and hypertension.   Review of Systems: As per HPI otherwise 10 point review of systems negative.    Past Medical History:  Diagnosis Date  . Broken shoulder   . Cerebrovascular disease    MRI/MRA in 2007 - 50% stenosis of supraclinoid ICA, left  . Clavicle fracture   . Closed wedge compression fracture of T1 vertebra ([place]) 02/20/2018  . Complex sleep apnea syndrome    adapt SV titration EEP 6 min 6 and max 15 cm water.   . Compression fracture   . Coronary atherosclerosis of native coronary artery 07/12/2013   a. NSTEMI in setting of AF with RVR 11/14 >> BMS to CFX;  b. Nuclear (9/15):  High risk - apical lateral ischemia >> c. LHC (9/15):  pLAD 50%, D1 (smaller br 90%/larger br 80%, pDx 50-60%, dLAD 50%, prox-mid CFX stent ok, pOM1 and OM2 50-70% (jailed by stent), OM5  70-80%, mPDA 90% (CFX dsz unchanged from 2014), RCA diff dsz, EF 55%, mild ant-apical HK >>  Med Rx  . CVA (cerebral vascular accident) ([place])   . Dementia ([place])   . Dementia ([place])    per son  . History of pneumonia   . Hx of echocardiogram    a. Echo 11/14): EF 55-60%, no RWMA, grade 2 DD, mild aortic stenosis (mean 15 mmHg), mild LAE  . Hypertension   . Lung nodule < 6cm on CT 02/20/2018  . Memory loss   . MI (myocardial infarction) ([place]) 07/2013  . PAF (paroxysmal atrial fibrillation) ([facility])    hx of NSTEMI in setting of AF with RVR 07/2013;  Eliquis for AC  . Shingles    left chest  . Syncope   . TIA (transient ischemic attack) 07/12/2013    Past Surgical History:  Procedure Laterality Date  . CATARACT EXTRACTION Bilateral   . KYPHOPLASTY    . LEFT HEART CATHETERIZATION WITH CORONARY ANGIOGRAM N/A 08/08/2013   Procedure: LEFT HEART CATHETERIZATION WITH CORONARY ANGIOGRAM;  Surgeon: Jettie Booze, MD;  Location: [facility] CATH LAB;  Service: Cardiovascular;  Laterality: N/A;  . LEFT HEART CATHETERIZATION WITH CORONARY ANGIOGRAM N/A 05/29/2014   Procedure: LEFT HEART CATHETERIZATION WITH CORONARY ANGIOGRAM;  Surgeon: Sinclair Grooms, MD;  Location: [facility] CATH LAB;  Service: Cardiovascular;  Laterality: N/A;  . PERCUTANEOUS STENT INTERVENTION  08/08/2013   Procedure: PERCUTANEOUS STENT  INTERVENTION;  Surgeon: Jettie Booze, MD;  Location: [facility] CATH LAB;  Service: Cardiovascular;;  . SHOULDER SURGERY Left   . TONSILLECTOMY     Soc Hx -  Widower after long marriage. He has 1 son, 1 daughter, several grandchildren. 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 He lives at home, his son lives with him. He has full time help in the day. He requires assistance with all ADLs but does feed himself.     reports that he has never smoked. He has never used smokeless tobacco. He reports current alcohol use of about 1.0 standard drink of alcohol per week. He reports that he does not use  drugs.  Allergies  Allergen Reactions  . Augmentin [Amoxicillin-Pot Clavulanate] Rash  . Keppra [Levetiracetam]     Trouble with walking, fatigued  . Penicillins Rash    Has patient had a PCN reaction causing immediate rash, facial/tongue/throat swelling, SOB or lightheadedness with hypotension: Yes Has patient had a PCN reaction causing severe rash involving mucus membranes or skin necrosis: Unk Has patient had a PCN reaction that required hospitalization: Yes Has patient had a PCN reaction occurring within the last 10 years: No If all of the above answers are "NO", then may proceed with Cephalosporin use.   . Sulfa Antibiotics Rash    Family History  Problem Relation Age of Onset  . Diabetes Mother   . Diabetes Father   . Cancer - Lung Brother   . Gait disorder Brother   . Cancer Daughter   . Stroke Sister   . Heart attack Neg Hx      Prior to Admission medications   Medication Sig Start Date End Date Taking? Authorizing Provider  amLODipine (NORVASC) 5 MG tablet Take 5 mg by mouth daily.    [provider]  atorvastatin (LIPITOR) 40 MG tablet TAKE 1 TABLET ONCE DAILY AT 6 PM. 08/21/20   Belva Crome, MD  cholecalciferol (VITAMIN D) 1000 UNITS tablet Take 1,000 Units by mouth daily.    [provider]  donepezil (ARICEPT) 10 MG tablet Take 0.5 tablets (5 mg total) by mouth at bedtime. 11/08/20   Kathrynn Ducking, MD  ELIQUIS 2.5 MG TABS tablet TAKE 1 TABLET BY MOUTH TWICE DAILY. 12/04/20   Belva Crome, MD  metoprolol tartrate (LOPRESSOR) 25 MG tablet TAKE 1 TABLET BY MOUTH TWICE DAILY. 08/21/20   Belva Crome, MD  Multiple Vitamins-Minerals (CENTRUM SILVER PO) Take 1 tablet by mouth daily.     [provider]  Multiple Vitamins-Minerals (PRESERVISION AREDS 2+MULTI VIT) CAPS Take 1 capsule by mouth in the morning and at bedtime.    [provider]  nitroGLYCERIN (NITROSTAT) 0.4 MG SL tablet Place 1 tablet (0.4 mg total) under the tongue  every 5 (five) minutes as needed for chest pain. Patient taking differently: Place 0.4 mg under the tongue every 5 (five) minutes as needed for chest pain (max 3 doses). 04/06/20   Belva Crome, MD  omeprazole (PRILOSEC) 20 MG capsule Take 20 mg by mouth daily before breakfast.     [provider]    Physical Exam: Vitals:   12/06/20 1300 12/06/20 1330 12/06/20 1400 12/06/20 1430  BP: (!) 160/86 (!) 173/110 (!) 185/90 (!) 162/88  Pulse: 79 85 86 81  Resp: 15 18 (!) 24 18  Temp:      TempSrc:      SpO2: 100% 98% 97% 98%    Vitals:  12/06/20 1300 12/06/20 1330 12/06/20 1400 12/06/20 1430  BP: (!) 160/86 (!) 173/110 (!) 185/90 (!) 162/88  Pulse: 79 85 86 81  Resp: 15 18 (!) 24 18  Temp:      TempSrc:      SpO2: 100% 98% 97% 98%   General: WNWD elderly man in no acute distress. Caveat - poor historian 2/2 dementia. Eyes: PERRL, lids and conjunctivae normal ENMT: Mucous membranes are moist. Posterior pharynx clear of any exudate or lesions.Normal dentition.  Neck: normal, supple, no masses, no thyromegaly Respiratory: clear to auscultation bilaterally, no wheezing, no crackles. Normal respiratory effort. No accessory muscle use.  Cardiovascular: Regular rate and rhythm, III/VI murmur best at LSB/ no rubs / no gallops. 2+ pitting lower  extremity edema to below the knee. trace pedal pulses. No carotid bruits.  Abdomen: no tenderness, no masses palpated. No hepatosplenomegaly. Bowel sounds positive.  Musculoskeletal: no clubbing / cyanosis. No joint deformity upper and lower extremities. Good ROM, no contractures. Normal muscle tone.  Skin: no rashes, lesions, ulcers. No induration. Advanced onychyomycosis toenails.  Neurologic: CN 2-12: nl facial symmetry and movement, PERRLA, EOMI, nl shrug. Sensation intact. Strength 4/5 in all 4. No tremor. Patient not stood or ambulated.  Psychiatric: Diminished insight and understanding of his condition. Slow speech and difficulty  completing thougths.. Alert and oriented x 2. Normal mood.     Labs on Admission: I have personally reviewed following labs and imaging studies  CBC: Recent Labs  Lab 12/06/20 1147  WBC 9.9  NEUTROABS 7.0  HGB 13.9  HCT 42.4  MCV 89.5  PLT 428   Basic Metabolic Panel: Recent Labs  Lab 12/06/20 1147  NA 139  K 4.2  CL 103  CO2 28  GLUCOSE 127*  BUN 34*  CREATININE 1.49*  CALCIUM 9.6   GFR: CrCl cannot be calculated (Unknown ideal weight.). Liver Function Tests: Recent Labs  Lab 12/06/20 1147  AST 25  ALT 19  ALKPHOS 94  BILITOT 0.7  PROT 6.8  ALBUMIN 3.6   No results for input(s): LIPASE, AMYLASE in the last 168 hours. No results for input(s): AMMONIA in the last 168 hours. Coagulation Profile: No results for input(s): INR, PROTIME in the last 168 hours. Cardiac Enzymes: No results for input(s): CKTOTAL, CKMB, CKMBINDEX, TROPONINI in the last 168 hours. BNP (last 3 results) No results for input(s): PROBNP in the last 8760 hours. HbA1C: No results for input(s): HGBA1C in the last 72 hours. CBG: No results for input(s): GLUCAP in the last 168 hours. Lipid Profile: No results for input(s): CHOL, HDL, LDLCALC, TRIG, CHOLHDL, LDLDIRECT in the last 72 hours. Thyroid Function Tests: No results for input(s): TSH, T4TOTAL, FREET4, T3FREE, THYROIDAB in the last 72 hours. Anemia Panel: No results for input(s): VITAMINB12, FOLATE, FERRITIN, TIBC, IRON, RETICCTPCT in the last 72 hours. Urine analysis:    Component Value Date/Time   COLORURINE YELLOW 08/14/2020 0427   APPEARANCEUR HAZY (A) 08/14/2020 0427   LABSPEC 1.012 08/14/2020 0427   PHURINE 5.0 08/14/2020 0427   GLUCOSEU NEGATIVE 08/14/2020 0427   HGBUR NEGATIVE 08/14/2020 0427   BILIRUBINUR NEGATIVE 08/14/2020 0427   KETONESUR NEGATIVE 08/14/2020 0427   PROTEINUR 100 (A) 08/14/2020 0427   UROBILINOGEN 0.2 12/11/2014 1450   NITRITE NEGATIVE 08/14/2020 0427   LEUKOCYTESUR NEGATIVE 08/14/2020 0427     Radiological Exams on Admission: DG Knee Complete 4 Views Right  Result Date: 12/06/2020 CLINICAL DATA:  Right knee pain.  No known injury. EXAM: RIGHT KNEE -  COMPLETE 4+ VIEW COMPARISON:  No recent prior. FINDINGS: No evidence of effusion. Diffuse osteopenia. Very mild tricompartment degenerative change. No acute bony or joint abnormality. No evidence of fracture dislocation. Peripheral vascular calcification. IMPRESSION: 1. Diffuse osteopenia and very mild degenerative change. No acute bony abnormality. 2.  Peripheral vascular disease. Electronically Signed   By: Marcello Moores  Register   On: 12/06/2020 14:18    EKG: Independently reviewed. SR with APCs, no acute changes  Assessment/Plan Active Problems:   Syncope and collapse   Dementia (HCC)   AF (paroxysmal atrial fibrillation) (HCC)   Sleep apnea with use of continuous positive airway pressure (CPAP)   HTN (hypertension)   Chronic kidney disease, stage 3a (HCC)   GERD without esophagitis    1. Syncope and collapse - a chronic problem for this patient, Today's episode different in that it was not related to eating/coughing or exertion. Neuro evaluations December with negative MRI brain and 11/08/20 by Dr. Jannifer Franklin for neurology unremarkable for acute problems. Last echo 06/29/20 w/ grade 1 diastoic dysfxn, severe Aortic valce calcification and thickening, no regurgitation. Patient also with h/o PAF on NOAC. Plan Obs/tele admit to monitor heart rhythm  Repeat 2 D echo - evaluate for new progress of AoV stenosis  Hydration  2. HTN- patient with poorly controlled HTN. Son very concerned about stroke risk! In Dec meds were held. Son has restarted amlodipine and continued metoprolol ER. Patient with 2+ LE edema. Mild renal insufficiency Plan Continue metoprolol 25 mg bid  D/c amlodipine  Hydralazine 25 mg PO q 6 prn SBP > 170  Defer long term adjustment of BP meds to Dr. Delfina Redwood or Dr. Daneen Schick  3. PAF - patient with h/o PAF but currently  in NSR Plan Tele monitoring  Continue metoprolol  Continue NOAC  4. CKD - patient with bump in Cr to 1.49. He received 1 L NS in ED Plan 1/2 NS at 75/hr x 12 hrs then 10 cc/hr  F/u Bmet in AM  5. Dementia - progressive disease but with mild remission over past several months. Followed by Dr. Jannifer Franklin Plan Continue Aricept  6. GERD - continue PPI  7. Sleep apnea - continue CPAP  8. Dysphagia - patient had MBS and SLP eval 11/09/20: pharyngeal dysphagia, think liquid aspiration. Dysphagia 3 diet with nectar thick liquids and aspiration precautions recommended - not sure if followed at home. Patient still coughs with eating per son. Plan Dysphagia 3 diet with nectar thick liquids.  9. Code Status - patient DNR. He is followed at home by palliative care service. No MOST form Plan Continue DNR status - son agrees.     DVT prophylaxis: eliquis Code Status: DNR  Family Communication: son present during exam. Understands condition and tx plan.  Disposition Plan: home 24-48 hrs  Consults called: none (with names) Admission status: obs-tele    Adella Hare MD [facility] Pager [phone]  If 7PM-7AM, please contact night-coverage [email or web address] Password [facility]  12/06/2020, 2:44 PM

## 2020-12-06 NOTE — Progress Notes (Signed)
RT note. RT placed pt. On auto titrate CPAP 15/5. VSS at this time, RT confirmed with patient he is able to remove mask in case of emergency. Within 5 minutes of wearing CPAP machine pt. Ripped mask off and stated he didn't want to wear it. CPAP machine still in room. RT will continue to monitor.
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
## 2020-12-06 NOTE — ED Notes (Signed)
Attempted to give report once.

## 2020-12-07 ENCOUNTER — Observation Stay (HOSPITAL_COMMUNITY): Payer: Medicare Other

## 2020-12-07 DIAGNOSIS — R55 Syncope and collapse: Secondary | ICD-10-CM | POA: Diagnosis not present

## 2020-12-07 DIAGNOSIS — I352 Nonrheumatic aortic (valve) stenosis with insufficiency: Secondary | ICD-10-CM | POA: Diagnosis not present

## 2020-12-07 DIAGNOSIS — Z79899 Other long term (current) drug therapy: Secondary | ICD-10-CM | POA: Diagnosis not present

## 2020-12-07 DIAGNOSIS — Z880 Allergy status to penicillin: Secondary | ICD-10-CM | POA: Diagnosis not present

## 2020-12-07 DIAGNOSIS — I2510 Atherosclerotic heart disease of native coronary artery without angina pectoris: Secondary | ICD-10-CM | POA: Diagnosis present

## 2020-12-07 DIAGNOSIS — N179 Acute kidney failure, unspecified: Secondary | ICD-10-CM | POA: Diagnosis present

## 2020-12-07 DIAGNOSIS — Z8673 Personal history of transient ischemic attack (TIA), and cerebral infarction without residual deficits: Secondary | ICD-10-CM | POA: Diagnosis not present

## 2020-12-07 DIAGNOSIS — I350 Nonrheumatic aortic (valve) stenosis: Secondary | ICD-10-CM | POA: Diagnosis present

## 2020-12-07 DIAGNOSIS — N1831 Chronic kidney disease, stage 3a: Secondary | ICD-10-CM | POA: Diagnosis present

## 2020-12-07 DIAGNOSIS — Z66 Do not resuscitate: Secondary | ICD-10-CM | POA: Diagnosis present

## 2020-12-07 DIAGNOSIS — I252 Old myocardial infarction: Secondary | ICD-10-CM | POA: Diagnosis not present

## 2020-12-07 DIAGNOSIS — I129 Hypertensive chronic kidney disease with stage 1 through stage 4 chronic kidney disease, or unspecified chronic kidney disease: Secondary | ICD-10-CM | POA: Diagnosis present

## 2020-12-07 DIAGNOSIS — M25461 Effusion, right knee: Secondary | ICD-10-CM | POA: Diagnosis present

## 2020-12-07 DIAGNOSIS — Z20822 Contact with and (suspected) exposure to covid-19: Secondary | ICD-10-CM | POA: Diagnosis present

## 2020-12-07 DIAGNOSIS — R1313 Dysphagia, pharyngeal phase: Secondary | ICD-10-CM | POA: Diagnosis present

## 2020-12-07 DIAGNOSIS — Z833 Family history of diabetes mellitus: Secondary | ICD-10-CM | POA: Diagnosis not present

## 2020-12-07 DIAGNOSIS — Z955 Presence of coronary angioplasty implant and graft: Secondary | ICD-10-CM | POA: Diagnosis not present

## 2020-12-07 DIAGNOSIS — I480 Paroxysmal atrial fibrillation: Secondary | ICD-10-CM | POA: Diagnosis not present

## 2020-12-07 DIAGNOSIS — G4733 Obstructive sleep apnea (adult) (pediatric): Secondary | ICD-10-CM | POA: Diagnosis present

## 2020-12-07 DIAGNOSIS — J449 Chronic obstructive pulmonary disease, unspecified: Secondary | ICD-10-CM | POA: Diagnosis present

## 2020-12-07 DIAGNOSIS — E860 Dehydration: Secondary | ICD-10-CM | POA: Diagnosis present

## 2020-12-07 DIAGNOSIS — Z888 Allergy status to other drugs, medicaments and biological substances status: Secondary | ICD-10-CM | POA: Diagnosis not present

## 2020-12-07 DIAGNOSIS — K219 Gastro-esophageal reflux disease without esophagitis: Secondary | ICD-10-CM | POA: Diagnosis present

## 2020-12-07 DIAGNOSIS — F0390 Unspecified dementia without behavioral disturbance: Secondary | ICD-10-CM | POA: Diagnosis present

## 2020-12-07 DIAGNOSIS — R040 Epistaxis: Secondary | ICD-10-CM | POA: Diagnosis present

## 2020-12-07 LAB — ECHOCARDIOGRAM COMPLETE
AR max vel: 1.63 cm2
AV Area VTI: 1.59 cm2
AV Area mean vel: 1.55 cm2
AV Mean grad: 24 mmHg
AV Peak grad: 38.7 mmHg
Ao pk vel: 3.11 m/s
Area-P 1/2: 1.49 cm2
Calc EF: 62.9 %
Height: 67 in
MV VTI: 3.05 cm2
S' Lateral: 2.2 cm
Single Plane A2C EF: 57.1 %
Single Plane A4C EF: 61.2 %
Weight: 2712.54 oz

## 2020-12-07 LAB — BASIC METABOLIC PANEL
Anion gap: 6 (ref 5–15)
BUN: 24 mg/dL — ABNORMAL HIGH (ref 8–23)
CO2: 24 mmol/L (ref 22–32)
Calcium: 9.4 mg/dL (ref 8.9–10.3)
Chloride: 105 mmol/L (ref 98–111)
Creatinine, Ser: 1.13 mg/dL (ref 0.61–1.24)
GFR, Estimated: >60 mL/min (ref >60)
Glucose, Bld: 107 mg/dL — ABNORMAL HIGH (ref 70–99)
Potassium: 4.1 mmol/L (ref 3.5–5.1)
Sodium: 135 mmol/L (ref 135–145)

## 2020-12-07 LAB — GLUCOSE, CAPILLARY: Glucose-Capillary: 96 mg/dL (ref 70–99)

## 2020-12-07 MED ORDER — AMLODIPINE BESYLATE 2.5 MG PO TABS
2.5000 mg | ORAL_TABLET | Freq: Every day | ORAL | Status: DC
  Administered 2020-12-07: 2.5 mg via ORAL
  Filled 2020-12-07: qty 1

## 2020-12-07 MED ORDER — HYDRALAZINE HCL 25 MG PO TABS: 25.0000 mg | ORAL_TABLET | Freq: Four times a day (QID) | ORAL | Status: DC | PRN

## 2020-12-07 NOTE — Progress Notes (Addendum)
PROGRESS NOTE    Derek [name]  HMC:[id number] DOB: 05/10/1927 DOA: 12/06/2020 PCP: Seward Carol, MD   Chief Complaint  Patient presents with  . Near Syncope  Brief Narrative: 87 year old male with significant medical history of COPD, PAF, aortic stenosis, CAD history of PCI, progressive dementia, dysphagia, syncope brought to the ED after with nosebleed followed by syncope and collapse. As per the report,Per the patient's son, who witnessed the event, he slumped over, became ashen and lost consciousness. He has also been having elevated blood pressures. EMS was activated and patient transported to [facility] for evaluation.   ED Course: T 98.4  BP 185/86, HR 85 RR 16.  ED-MD exam notable for swelling and pain right knee. No blood in nares. Non-focal neuro exam. CMet with glucose of 127  Cr 1.49 (1.12 Dec '21) Hgb 13.9. EKG SR with APCs w/o acute changes. TRH asked to admit for further evaluation and treatment of syncope and hypertension.   Subjective: Seen and examined this morning.  Patient is alert awake oriented to self, no specific complaints appears sleepy. Doing well on room air.  Assessment & Plan:  syncope and collapse following epistaxis episode.  He has had history of recurrent syncope chronically this episode was felt to be deferred as not related to eating/coughing or exertion.  Suspect similar mechanism, rule out other etiology monitoring telemetry, repeat 2D echo pending to evaluate for aortic valve, he has been hydrated well continue p.o. hydration.  Check orthostatic vitals.  Amlodipine has been discontinued and on hydralazine as needed.  Continue metoprolol.  PT OT evaluation  Dementia alert awake but appears demented weak and frail.  Continue supportive care with fall precaution, aspiration precaution and delirium precaution.  MOQ:HUTM controlled continue metoprolol and continues on Eliquis.    Episode of epistaxis at home but no recurrence since.  Monitor H&H he is on  anticoagulation.  He has ongoing issues with need to avoid any risk benefits.  He is followed by cardiology. Recent Labs  Lab 12/06/20 1147  HGB 13.9  HCT 42.4   Dysphagia had MBS/SLP eval 11/09/2020 found to have pharyngeal dysphagia thin liquid aspiration and dysphagia 3 diet with nectar thick liquids and aspiration precaution will be continued as per recommendation.  Not sure if he is following this at home.  He coughs with eating as per the son.  Osa: Continue CPAP does not seem to be tolerating.  HTN blood pressure well controlled-amlodipine discontinued due to #1.  Continue metoprolol and hydralazine 25 mg p.o. every 6 hours for systolic blood pressure more than 170. Son would want him to be back on anti-hypertensive- we can consider 2.5 mg  Amlodipine daily.  Dehydration and AKI on chronic kidney disease, stage 3a: Baseline creatinine appears to be 1.12 range, on admission slightly elevated 1.4 and with IV fluids improved to 1.1. monitor. Recent Labs  Lab 12/06/20 1147 12/07/20 0245  BUN 34* 24*  CREATININE 1.49* 1.13   GERD without esophagitis: Continue PPI  Goals of CARE continue DNR.  Elderly gentleman debilitated multiple comorbidities, on anticoagulation.  Palliative care requested  Diet Order            DIET DYS 3 Room service appropriate? Yes with Assist; Fluid consistency: Nectar Thick  Diet effective now               Patient's Body mass index is 26.55 kg/m. DVT prophylaxis: apixaban (ELIQUIS) tablet 2.5 mg Start: 12/06/20 2200 Code Status:   Code Status: DNR  Family Communication: plan of care discussed with patient at bedside. I called his [name] d/w PCO, but I was unable to reach-left message to call back at nursing station .  Spoke with son later- he c/o Rt knee pain. Son wants him to be back on amlodipine and with High BP pt does not do well. He is concerned abt stroke. He is followed by Dr Tamala Julian as OP. We discussed and will resume amlodipine 2.5  mg, he HS baseline is in 55.  Status KD:TOIZTIWP as Observation Remains hospitalized for ongoing medical management of his syncope. The patient will require care spanning > 2 midnights and should be moved to inpatient because: Ongoing diagnostic testing needed not appropriate for outpatient work up and Inpatient level of care appropriate due to severity of illness  Dispo: The patient is from: Home              Anticipated d/c is to: TBD              Patient currently is not medically stable to d/c.   Difficult to place patient No  Unresulted Labs (From admission, onward)         None      Medications reviewed:  Scheduled Meds: . apixaban  2.5 mg Oral BID  . atorvastatin  40 mg Oral q1800  . donepezil  5 mg Oral QHS  . metoprolol tartrate  25 mg Oral BID  . pantoprazole  40 mg Oral Daily  . senna  1 tablet Oral BID   Continuous Infusions:  Consultants:see note  Procedures:see note  Antimicrobials: Anti-infectives (From admission, onward)   None     Culture/Microbiology    Component Value Date/Time   SDES URINE, CLEAN CATCH 06/28/2020 2133   [place] 06/28/2020 2133   CULT (A) 06/28/2020 2133    <10,000 COLONIES/mL INSIGNIFICANT GROWTH Performed at [facility] [address], [place], [place] [address]    REPTSTATUS 06/30/2020 FINAL 06/28/2020 2133    Other culture-see note  Objective: Vitals: Today's Vitals   12/07/20 0413 12/07/20 0734 12/07/20 0757 12/07/20 1012  BP: (!) 178/87  (!) 160/78 (!) 160/78  Pulse: 69  73 73  Resp: 18  18 18   Temp: 98.6 F (37 C)  98.5 F (36.9 C) 98.5 F (36.9 C)  TempSrc: Oral  Oral Oral  SpO2: 96%  94%   Weight: 76.9 kg     Height:    5\' 7"  (1.702 m)  PainSc:  0-No pain      Intake/Output Summary (Last 24 hours) at 12/07/2020 1323 Last data filed at 12/07/2020 0930 Gross per 24 hour  Intake 1442.19 ml  Output 2400 ml  Net -957.81 ml   Filed Weights   12/07/20 0413  Weight: 76.9 kg   Weight change:    Intake/Output from previous day: 03/31 0701 - 04/01 0700 In: 1442.2 [P.O.:240; I.V.:202.2; IV Piggyback:1000] Out: 1750 [Urine:1750] Intake/Output this shift: Total I/O In: -  Out: 650 [Urine:650] Filed Weights   12/07/20 0413  Weight: 76.9 kg    Examination: General exam: Weak and frail appearing.  Tell me same , elderly  HEENT:Oral mucosa moist, Ear/Nose WNL grossly,dentition normal. Respiratory system: bilaterally diminished,no use of accessory muscle, non tender. Cardiovascular system: S1 & S2 +, IRregular, No JVD. Gastrointestinal system: Abdomen soft, NT,ND, BS+. Nervous System:Alert, awake, moving his extremities and grossly nonfocal Extremities: No edema, distal peripheral pulses palpable.  Skin: No rashes,no icterus. MSK: Normal muscle bulk,tone, power  Data Reviewed: I have personally reviewed following labs and imaging studies CBC: Recent Labs  Lab 12/06/20 1147  WBC 9.9  NEUTROABS 7.0  HGB 13.9  HCT 42.4  MCV 89.5  PLT 767   Basic Metabolic Panel: Recent Labs  Lab 12/06/20 1147 12/07/20 0245  NA 139 135  K 4.2 4.1  CL 103 105  CO2 28 24  GLUCOSE 127* 107*  BUN 34* 24*  CREATININE 1.49* 1.13  CALCIUM 9.6 9.4   GFR: Estimated Creatinine Clearance: 39.8 mL/min (by C-G formula based on SCr of 1.13 mg/dL). Liver Function Tests: Recent Labs  Lab 12/06/20 1147  AST 25  ALT 19  ALKPHOS 94  BILITOT 0.7  PROT 6.8  ALBUMIN 3.6   No results for input(s): LIPASE, AMYLASE in the last 168 hours. No results for input(s): AMMONIA in the last 168 hours. Coagulation Profile: No results for input(s): INR, PROTIME in the last 168 hours. Cardiac Enzymes: No results for input(s): CKTOTAL, CKMB, CKMBINDEX, TROPONINI in the last 168 hours. BNP (last 3 results) No results for input(s): PROBNP in the last 8760 hours. HbA1C: No results for input(s): HGBA1C in the last 72 hours. CBG: Recent Labs  Lab 12/07/20 0703  GLUCAP 96   Lipid Profile: No  results for input(s): CHOL, HDL, LDLCALC, TRIG, CHOLHDL, LDLDIRECT in the last 72 hours. Thyroid Function Tests: Recent Labs    12/06/20 1624  TSH 1.291   Anemia Panel: No results for input(s): VITAMINB12, FOLATE, FERRITIN, TIBC, IRON, RETICCTPCT in the last 72 hours. Sepsis Labs: No results for input(s): PROCALCITON, LATICACIDVEN in the last 168 hours.  Recent Results (from the past 240 hour(s))  Resp Panel by RT-PCR (Flu A&B, Covid) Nasopharyngeal Swab     Status: None   Collection Time: 12/06/20  1:38 PM   Specimen: Nasopharyngeal Swab; Nasopharyngeal(NP) swabs in vial transport medium  Result Value Ref Range Status   SARS Coronavirus 2 by RT PCR NEGATIVE NEGATIVE Final    Comment: (NOTE) SARS-CoV-2 target nucleic acids are NOT DETECTED.  The SARS-CoV-2 RNA is generally detectable in upper respiratory specimens during the acute phase of infection. The lowest concentration of SARS-CoV-2 viral copies this assay can detect is 138 copies/mL. A negative result does not preclude SARS-Cov-2 infection and should not be used as the sole basis for treatment or other patient management decisions. A negative result may occur with  improper specimen collection/handling, submission of specimen other than nasopharyngeal swab, presence of viral mutation(s) within the areas targeted by this assay, and inadequate number of viral copies(<138 copies/mL). A negative result must be combined with clinical observations, patient history, and epidemiological information. The expected result is Negative.  Fact Sheet for Patients:  EntrepreneurPulse.com.au  Fact Sheet for Healthcare Providers:  IncredibleEmployment.be  This test is no t yet approved or cleared by the [place] FDA and  has been authorized for detection and/or diagnosis of SARS-CoV-2 by FDA under an Emergency Use Authorization (EUA). This EUA will remain  in effect (meaning this test can be  used) for the duration of the COVID-19 declaration under Section 564(b)(1) of the Act, 21 U.S.C.section 360bbb-3(b)(1), unless the authorization is terminated  or revoked sooner.       Influenza A by PCR NEGATIVE NEGATIVE Final   Influenza B by PCR NEGATIVE NEGATIVE Final    Comment: (NOTE) The Xpert Xpress SARS-CoV-2/FLU/RSV plus assay is intended as an aid in the diagnosis of influenza from Nasopharyngeal swab specimens and should not be used as a sole  basis for treatment. Nasal washings and aspirates are unacceptable for Xpert Xpress SARS-CoV-2/FLU/RSV testing.  Fact Sheet for Patients: EntrepreneurPulse.com.au  Fact Sheet for Healthcare Providers: IncredibleEmployment.be  This test is not yet approved or cleared by the [place] FDA and has been authorized for detection and/or diagnosis of SARS-CoV-2 by FDA under an Emergency Use Authorization (EUA). This EUA will remain in effect (meaning this test can be used) for the duration of the COVID-19 declaration under Section 564(b)(1) of the Act, 21 U.S.C. section 360bbb-3(b)(1), unless the authorization is terminated or revoked.  Performed at [facility], [place] [address], [place], [place] [address]      Radiology Studies: DG Knee Complete 4 Views Right  Result Date: 12/06/2020 CLINICAL DATA:  Right knee pain.  No known injury. EXAM: RIGHT KNEE - COMPLETE 4+ VIEW COMPARISON:  No recent prior. FINDINGS: No evidence of effusion. Diffuse osteopenia. Very mild tricompartment degenerative change. No acute bony or joint abnormality. No evidence of fracture dislocation. Peripheral vascular calcification. IMPRESSION: 1. Diffuse osteopenia and very mild degenerative change. No acute bony abnormality. 2.  Peripheral vascular disease. Electronically Signed   By: Marcello Moores  Register   On: 12/06/2020 14:18   ECHOCARDIOGRAM COMPLETE  Result Date: 12/07/2020    ECHOCARDIOGRAM REPORT   Patient Name:    Derek [name] Date of Exam: 12/07/2020 Medical Rec #:  [id number]        Height:       67.0 in Accession #:    [id number]       Weight:       169.5 lb Date of Birth:  Nov 06, 1926        BSA:          1.885 m Patient Age:    69 years         BP:           160/78 mmHg Patient Gender: M                HR:           74 bpm. Exam Location:  Inpatient Procedure: 2D Echo, Cardiac Doppler and Color Doppler Indications:    Aortic Stenosis                 Syncope  History:        Patient has prior history of Echocardiogram examinations, most                 recent 06/29/2020. Previous Myocardial Infarction, TIA and                 Stroke, Arrythmias:Atrial Fibrillation, Signs/Symptoms:Syncope;                 Risk Factors:Hypertension.  Sonographer:    Luisa Hart RDCS Referring Phys: [place]  1. Left ventricular ejection fraction, by estimation, is 60 to 65%. The left ventricle has normal function. The left ventricle has no regional wall motion abnormalities. Left ventricular diastolic parameters are consistent with Grade I diastolic dysfunction (impaired relaxation). The average left ventricular global longitudinal strain is -17.8 %. The global longitudinal strain is normal.  2. Right ventricular systolic function is normal. The right ventricular size is normal. There is mildly elevated pulmonary artery systolic pressure.  3. The mitral valve is normal in structure. No evidence of mitral valve regurgitation. No evidence of mitral stenosis. Moderate mitral annular calcification.  4. The aortic valve is calcified. There is severe calcifcation of the  aortic valve. There is severe thickening of the aortic valve. Aortic valve regurgitation is not visualized. Moderate aortic valve stenosis. Aortic valve area, by VTI measures 1.59 cm. Aortic valve mean gradient measures 24.0 mmHg. Aortic valve Vmax measures 3.11 m/s.  5. The inferior vena cava is normal in size with greater than 50% respiratory  variability, suggesting right atrial pressure of 3 mmHg. Comparison(s): No significant change from prior study. Prior images reviewed side by side. FINDINGS  Left Ventricle: Left ventricular ejection fraction, by estimation, is 60 to 65%. The left ventricle has normal function. The left ventricle has no regional wall motion abnormalities. The average left ventricular global longitudinal strain is -17.8 %. The global longitudinal strain is normal. The left ventricular internal cavity size was normal in size. There is no left ventricular hypertrophy. Left ventricular diastolic parameters are consistent with Grade I diastolic dysfunction (impaired relaxation). Right Ventricle: The right ventricular size is normal. No increase in right ventricular wall thickness. Right ventricular systolic function is normal. There is mildly elevated pulmonary artery systolic pressure. The tricuspid regurgitant velocity is 3.12  m/s, and with an assumed right atrial pressure of 3 mmHg, the estimated right ventricular systolic pressure is 03.5 mmHg. Left Atrium: Left atrial size was normal in size. Right Atrium: Right atrial size was normal in size. Pericardium: There is no evidence of pericardial effusion. Mitral Valve: The mitral valve is normal in structure. Moderate mitral annular calcification. No evidence of mitral valve regurgitation. No evidence of mitral valve stenosis. MV peak gradient, 6.1 mmHg. The mean mitral valve gradient is 2.0 mmHg. Tricuspid Valve: The tricuspid valve is normal in structure. Tricuspid valve regurgitation is trivial. No evidence of tricuspid stenosis. Aortic Valve: The aortic valve is calcified. There is severe calcifcation of the aortic valve. There is severe thickening of the aortic valve. Aortic valve regurgitation is not visualized. Moderate aortic stenosis is present. Aortic valve mean gradient measures 24.0 mmHg. Aortic valve peak gradient measures 38.7 mmHg. Aortic valve area, by VTI measures 1.59  cm. Pulmonic Valve: The pulmonic valve was normal in structure. Pulmonic valve regurgitation is not visualized. No evidence of pulmonic stenosis. Aorta: The aortic root is normal in size and structure. Venous: The inferior vena cava is normal in size with greater than 50% respiratory variability, suggesting right atrial pressure of 3 mmHg. IAS/Shunts: No atrial level shunt detected by color flow Doppler.  LEFT VENTRICLE PLAX 2D LVIDd:         3.30 cm     Diastology LVIDs:         2.20 cm     LV e' medial:    6.38 cm/s LV PW:         1.30 cm     LV E/e' medial:  12.5 LV IVS:        1.30 cm     LV e' lateral:   5.63 cm/s LVOT diam:     2.40 cm     LV E/e' lateral: 14.1 LV SV:         111 LV SV Index:   59          2D Longitudinal Strain LVOT Area:     4.52 cm    2D Strain GLS Avg:     -17.8 %  LV Volumes (MOD) LV vol d, MOD A2C: 60.6 ml LV vol d, MOD A4C: 63.6 ml LV vol s, MOD A2C: 26.0 ml LV vol s, MOD A4C: 24.7 ml LV SV MOD A2C:  34.6 ml LV SV MOD A4C:     63.6 ml LV SV MOD BP:      41.6 ml RIGHT VENTRICLE RV S prime:     10.30 cm/s  PULMONARY VEINS TAPSE (M-mode): 1.1 cm      A Reversal Duration: 92.00 msec                             A Reversal Velocity: 24.90 cm/s                             Diastolic Velocity:  50.35 cm/s                             S/D Velocity:        1.60                             Systolic Velocity:   46.56 cm/s LEFT ATRIUM             Index       RIGHT ATRIUM           Index LA diam:        3.90 cm 2.07 cm/m  RA Area:     16.70 cm LA Vol (A2C):   44.9 ml 23.82 ml/m RA Volume:   44.00 ml  23.34 ml/m LA Vol (A4C):   53.7 ml 28.49 ml/m LA Biplane Vol: 49.1 ml 26.05 ml/m  AORTIC VALVE                    PULMONIC VALVE AV Area (Vmax):    1.63 cm     PV Vmax:       0.88 m/s AV Area (Vmean):   1.55 cm     PV Vmean:      64.500 cm/s AV Area (VTI):     1.59 cm     PV VTI:        0.223 m AV Vmax:           311.00 cm/s  PV Peak grad:  3.1 mmHg AV Vmean:          220.667 cm/s PV Mean  grad:  2.0 mmHg AV VTI:            0.698 m AV Peak Grad:      38.7 mmHg AV Mean Grad:      24.0 mmHg LVOT Vmax:         111.93 cm/s LVOT Vmean:        75.767 cm/s LVOT VTI:          0.246 m LVOT/AV VTI ratio: 0.35  AORTA Ao Root diam: 3.50 cm Ao Asc diam:  3.50 cm MITRAL VALVE                TRICUSPID VALVE MV Area (PHT): 1.49 cm     TR Peak grad:   38.9 mmHg MV Area VTI:   3.05 cm     TR Vmax:        312.00 cm/s MV Peak grad:  6.1 mmHg MV Mean grad:  2.0 mmHg     SHUNTS MV Vmax:       1.23 m/s     Systemic VTI:  0.25 m MV Vmean:      64.5 cm/s  Systemic Diam: 2.40 cm MV Decel Time: 510 msec MV E velocity: 79.60 cm/s MV A velocity: 116.00 cm/s MV E/A ratio:  0.69 Candee Furbish MD Electronically signed by Candee Furbish MD Signature Date/Time: 12/07/2020/12:15:24 PM    Final      LOS: 0 days   Antonieta Pert, MD [facility]  12/07/2020, 1:23 PM

## 2020-12-07 NOTE — Progress Notes (Signed)
COMMUNITY PALLIATIVE CARE SW NOTE  PATIENT NAME: Derek [name] DOB: 08-10-1927 MRN: [id number]  PRIMARY CARE PROVIDER: Seward Carol, MD  RESPONSIBLE PARTY:  Acct ID - Guarantor Home Phone Work Phone Relationship Acct Type  [id number] Milford [phone]  Self P/F     [address], [place], [place] [address]     PLAN OF CARE and INTERVENTIONS:             1. GOALS OF CARE/ ADVANCE CARE PLANNING:  Goals of care is for patient to remain in his home. Patient is a DNR. 2. SOCIAL/EMOTIONAL/SPIRITUAL ASSESSMENT/ INTERVENTIONS:  SW completed a visit with patient at his home. He was present with his son-Tommy and sitter-Pam He was sitting in his wheelchair at the kitchen table. He greeted SW warmly. He is pleasantly confused and his verbalizations are delayed and less than 6/7 words. He denied pain. However, SW observed swelling in his feet and ankles, which his son expressed concern about. Patient is scheduled to his cardiologist on 4/6 and he will discuss this concern with the physician at that time. The son advised that patent is sleeping more throughout the day. His appetite remains fair. Patient is ambulating with his walker or wheelchair. He son reported that patient has had 3 falls in the last two weeks, where patient tried to get up unassisted-he sustained no injuries. PCG has increased hired caregiver support to 8 hrs/day through [employer]. SW assessed patient's needs and comfort and coping of PCG. PCG remains open to ongoing palliative care support. 3. PATIENT/CAREGIVER EDUCATION/ COPING:  Patient and his son appears to be coping well.  4. PERSONAL EMERGENCY PLAN:  911 can be activated for emergencies. 5. COMMUNITY RESOURCES COORDINATION/ HEALTH CARE NAVIGATION:  Patient receiving private caregiver support through [employer]. 6. FINANCIAL/LEGAL CONCERNS/INTERVENTIONS:  None.     SOCIAL HX:  Social History   Tobacco Use  . Smoking status: Never Smoker  . Smokeless  tobacco: Never Used  Substance Use Topics  . Alcohol use: Yes    Alcohol/week: 1.0 standard drink    Types: 1 Glasses of wine per week    Comment: occasionally    CODE STATUS: DNR  ADVANCED DIRECTIVES: Yes MOST FORM COMPLETE:  No HOSPICE EDUCATION PROVIDED: No  PPS: Patient is alert and oriented to self. He continues to decline cognitively.  He is sleeping more and deeper and  patient is incontinent and dependent for personal care. Patient requires cueing and encouragement to eat.  Duration of visit and documentation: 60 minutes      Katheren Puller, LCSW

## 2020-12-08 DIAGNOSIS — I480 Paroxysmal atrial fibrillation: Secondary | ICD-10-CM | POA: Diagnosis not present

## 2020-12-08 LAB — BASIC METABOLIC PANEL
Anion gap: 8 (ref 5–15)
BUN: 26 mg/dL — ABNORMAL HIGH (ref 8–23)
CO2: 23 mmol/L (ref 22–32)
Calcium: 9.1 mg/dL (ref 8.9–10.3)
Chloride: 104 mmol/L (ref 98–111)
Creatinine, Ser: 1.46 mg/dL — ABNORMAL HIGH (ref 0.61–1.24)
GFR, Estimated: 45 mL/min — ABNORMAL LOW (ref >60)
Glucose, Bld: 83 mg/dL (ref 70–99)
Potassium: 4 mmol/L (ref 3.5–5.1)
Sodium: 135 mmol/L (ref 135–145)

## 2020-12-08 LAB — GLUCOSE, CAPILLARY: Glucose-Capillary: 84 mg/dL (ref 70–99)

## 2020-12-08 LAB — CBC
HCT: 38.5 % — ABNORMAL LOW (ref 39.0–52.0)
Hemoglobin: 13.2 g/dL (ref 13.0–17.0)
MCH: 30.3 pg (ref 26.0–34.0)
MCHC: 34.3 g/dL (ref 30.0–36.0)
MCV: 88.3 fL (ref 80.0–100.0)
Platelets: 209 10*3/uL (ref 150–400)
RBC: 4.36 MIL/uL (ref 4.22–5.81)
RDW: 13.6 % (ref 11.5–15.5)
WBC: 9.6 10*3/uL (ref 4.0–10.5)
nRBC: 0 % (ref 0.0–0.2)

## 2020-12-08 MED ORDER — AMLODIPINE BESYLATE 5 MG PO TABS
5.0000 mg | ORAL_TABLET | Freq: Every day | ORAL | Status: DC
  Administered 2020-12-08: 5 mg via ORAL
  Filled 2020-12-08: qty 1

## 2020-12-08 MED ORDER — RESOURCE THICKENUP CLEAR PO POWD
ORAL | Status: DC | PRN
  Filled 2020-12-08: qty 125

## 2020-12-08 NOTE — Discharge Summary (Signed)
Physician Discharge Summary  Derek [name] GEX:[id number] DOB: 1926-12-31 DOA: 12/06/2020  PCP: Seward Carol, MD  Admit date: 12/06/2020 Discharge date: 12/08/2020 30 Day Unplanned Readmission Risk Score   Flowsheet Row ED to Hosp-Admission (Current) from 12/06/2020 in [facility]  30 Day Unplanned Readmission Risk Score (%) 15.73 Filed at 12/08/2020 1200     This score is the patient's risk of an unplanned readmission within 30 days of being discharged (0 -100%). The score is based on dignosis, age, lab data, medications, orders, and past utilization.   Low:  0-14.9   Medium: 15-21.9   High: 22-29.9   Extreme: 30 and above         Admitted From: Home Disposition: Home  Recommendations for Outpatient Follow-up:  1. Follow up with PCP in 1-2 weeks 2. Please obtain BMP/CBC in one week 3. Please follow up with your PCP on the following pending results: Unresulted Labs (From admission, onward)         None        Home Health: None Equipment/Devices: None  Discharge Condition: Stable CODE STATUS: DNR Diet recommendation: Cardiac  Subjective: Seen and examined.  Patient alert and oriented x2.  He denied any complaint.  Was not aware of place.  Has baseline dementia and seems to be at baseline.  HPI: 87 year old male with significant medical history of COPD, PAF, aortic stenosis, CAD history of PCI, progressive dementia, dysphagia, syncope brought to the ED after with nosebleed followed by syncope and collapse. As per the report,Per the patient's son, who witnessed the event, he slumped over, became ashen and lost consciousness. He has also been having elevated blood pressures. EMS was activated and patient transported to [facility] for evaluation.  ED Course:T 98.4 BP 185/86, HR 85 RR 16. ED-MD exam notable for swelling and pain right knee. No blood in nares. Non-focal neuro exam. CMet with glucose of 127 Cr 1.49 (1.12 Dec '21) Hgb 13.9. EKG SR with APCs w/o  acute changes.     Brief/Interim Summary: He was admitted to hospital service for further evaluation and treatment of syncope and hypertension.   Initially his antihypertensives were held as his blood pressure was slightly low.  However his orthostatic vital signs were within normal range.  He has a history of PAF and his rates were controlled on metoprolol and his Eliquis was continued.  Patient did not have any witnessed episode of epistaxis since arrival to the ED and throughout his hospitalization.  He was thought to be having mild dehydration and AKI on CKD stage IIIa for which he was provided with IV fluids and his renal function improved to his baseline.  Transthoracic echo was done which once again showed severe calcification of aortic valve and moderate aortic stenosis which is no different than echo done in October 2021.  Because of his syncope was likely vasovagal due to epistaxis.  Patient also ended up having x-ray of the right knee since son had mentioned that patient was complaining of right knee pain.  X-ray did not show any acute pathology.  Patient was also evaluated by PT OT.  Patient was requiring minimum to moderate assistance.  I had a very lengthy discussion with the son who informed me that patient has caregivers every day for 8 to 9 hours which is private pay and then the son gets home and takes care of him at night.  Patient essentially has 24-hour care.  Son was not willing to consider SNF either.  PT has recommended knee brace which son will buy at his own cost from pharmacy.  Son is agreeable with the discharge plan.  Discharge Diagnoses:  Active Problems:   Dementia (HCC)   Syncope and collapse   AF (paroxysmal atrial fibrillation) (HCC)   Sleep apnea with use of continuous positive airway pressure (CPAP)   HTN (hypertension)   Chronic kidney disease, stage 3a (HCC)   GERD without esophagitis    Discharge Instructions   Allergies as of 12/08/2020      Reactions    Amoxicillin-pot Clavulanate Swelling, Rash, Other (See Comments)   "He looked like a banana."- affected liver   Hydrochlorothiazide Other (See Comments)   Hyponatremia   Keppra [levetiracetam] Other (See Comments)   Trouble with walking, fatigued   Penicillins Rash   Has patient had a PCN reaction causing immediate rash, facial/tongue/throat swelling, SOB or lightheadedness with hypotension: Yes Has patient had a PCN reaction causing severe rash involving mucus membranes or skin necrosis: Unk Has patient had a PCN reaction that required hospitalization: Yes Has patient had a PCN reaction occurring within the last 10 years: No If all of the above answers are "NO", then may proceed with Cephalosporin use. Other reaction(s): Unknown   Sulfa Antibiotics Rash      Medication List    TAKE these medications   amLODipine 5 MG tablet Commonly known as: NORVASC Take 5 mg by mouth daily.   atorvastatin 40 MG tablet Commonly known as: LIPITOR TAKE 1 TABLET ONCE DAILY AT 6 PM. What changed: See the new instructions.   PreserVision AREDS 2+Multi Vit Caps Take 1 capsule by mouth in the morning and at bedtime.   CENTRUM SILVER PO Take 1 tablet by mouth daily.   donepezil 10 MG tablet Commonly known as: ARICEPT Take 0.5 tablets (5 mg total) by mouth at bedtime. What changed: how much to take   Eliquis 2.5 MG Tabs tablet Generic drug: apixaban TAKE 1 TABLET BY MOUTH TWICE DAILY. What changed:   how much to take  when to take this   metoprolol tartrate 25 MG tablet Commonly known as: LOPRESSOR TAKE 1 TABLET BY MOUTH TWICE DAILY. What changed: when to take this   nitroGLYCERIN 0.4 MG SL tablet Commonly known as: Nitrostat Place 1 tablet (0.4 mg total) under the tongue every 5 (five) minutes as needed for chest pain. What changed: when to take this   PriLOSEC OTC 20 MG tablet Generic drug: omeprazole Take 20 mg by mouth daily before breakfast.   Vitamin D3 25 MCG (1000 UT)  Caps Take 1,000 Units by mouth daily.       Follow-up Information    Seward Carol, MD Follow up in 1 week(s).   Specialty: Internal Medicine Contact information: [address] [place] [address] [phone]        Belva Crome, MD .   Specialty: Cardiology Contact information: [phone] [place] [address] [phone]              Allergies  Allergen Reactions  . Amoxicillin-Pot Clavulanate Swelling, Rash and Other (See Comments)    "He looked like a banana."- affected liver  . Hydrochlorothiazide Other (See Comments)    Hyponatremia  . Keppra [Levetiracetam] Other (See Comments)    Trouble with walking, fatigued  . Penicillins Rash    Has patient had a PCN reaction causing immediate rash, facial/tongue/throat swelling, SOB or lightheadedness with hypotension: Yes Has patient had a  PCN reaction causing severe rash involving mucus membranes or skin necrosis: Unk Has patient had a PCN reaction that required hospitalization: Yes Has patient had a PCN reaction occurring within the last 10 years: No If all of the above answers are "NO", then may proceed with Cephalosporin use.  Other reaction(s): Unknown  . Sulfa Antibiotics Rash    Consultations: None   Procedures/Studies: Leanne Chang OP MEDICARE SPEECH PATH  Result Date: 11/09/2020 Objective Swallowing Evaluation: Type of Study: MBS-Modified Barium Swallow Study  Patient Details Name: JARRICK FJELD MRN: [id number] Date of Birth: 1926-11-02 Today's Date: 11/09/2020 Time: SLP Start Time (ACUTE ONLY): 1310 -SLP Stop Time (ACUTE ONLY): 1400 SLP Time Calculation (min) (ACUTE ONLY): 50 min Past Medical History: Past Medical History: Diagnosis Date . Broken shoulder  . Cerebrovascular disease   MRI/MRA in 2007 - 50% stenosis of supraclinoid ICA, left . Clavicle fracture  . Closed wedge compression fracture of T1 vertebra ([place]) 02/20/2018 . Complex sleep apnea syndrome    adapt SV titration EEP 6 min 6 and max 15 cm water.  . Compression fracture  . Coronary atherosclerosis of native coronary artery 07/12/2013  a. NSTEMI in setting of AF with RVR 11/14 >> BMS to CFX;  b. Nuclear (9/15):  High risk - apical lateral ischemia >> c. LHC (9/15):  pLAD 50%, D1 (smaller br 90%/larger br 80%, pDx 50-60%, dLAD 50%, prox-mid CFX stent ok, pOM1 and OM2 50-70% (jailed by stent), OM5 70-80%, mPDA 90% (CFX dsz unchanged from 2014), RCA diff dsz, EF 55%, mild ant-apical HK >>  Med Rx . CVA (cerebral vascular accident) ([place])  . Dementia ([place])  . History of pneumonia  . Hx of echocardiogram   a. Echo 11/14): EF 55-60%, no RWMA, grade 2 DD, mild aortic stenosis (mean 15 mmHg), mild LAE . Hypertension  . Lung nodule < 6cm on CT 02/20/2018 . MI (myocardial infarction) ([place]) 07/2013 . PAF (paroxysmal atrial fibrillation) (HCC)   hx of NSTEMI in setting of AF with RVR 07/2013;  Eliquis for AC . Shingles   left chest . Syncope  . TIA (transient ischemic attack) 07/12/2013 Past Surgical History: Past Surgical History: Procedure Laterality Date . CATARACT EXTRACTION Bilateral  . KYPHOPLASTY   . LEFT HEART CATHETERIZATION WITH CORONARY ANGIOGRAM N/A 08/08/2013  Procedure: LEFT HEART CATHETERIZATION WITH CORONARY ANGIOGRAM;  Surgeon: Jettie Booze, MD;  Location: [facility] CATH LAB;  Service: Cardiovascular;  Laterality: N/A; . LEFT HEART CATHETERIZATION WITH CORONARY ANGIOGRAM N/A 05/29/2014  Procedure: LEFT HEART CATHETERIZATION WITH CORONARY ANGIOGRAM;  Surgeon: Sinclair Grooms, MD;  Location: [facility] CATH LAB;  Service: Cardiovascular;  Laterality: N/A; . PERCUTANEOUS STENT INTERVENTION  08/08/2013  Procedure: PERCUTANEOUS STENT INTERVENTION;  Surgeon: Jettie Booze, MD;  Location: [facility] CATH LAB;  Service: Cardiovascular;; . SHOULDER SURGERY Left  . TONSILLECTOMY   HPI: 87 yo male referred for OP MBS by Dr Delfina Redwood.  Pt has h/o dementia, GERD, Afib, CAD, HTN, aortic stenosis.  Pt is being referred for MBS due to  coughing with intake.  Pt is being followed by palliative care.  Pt son reports pt has decreased function since recent hospital admit, ? CVA/TIA.  Pt used to [employer]" per son - but is unable to conduct at this time.  Minimal secretions retained in oral cavity. Son reports pt nose runs.  He has a good appetite = 2 meals a day.  Subjective: pt awake in chair Assessment / Plan / Recommendation CHL IP CLINICAL IMPRESSIONS 11/09/2020 Clinical  Impression Patient presents with moderate oral and mild pharyngeal dysphagia.  Delayed oral transiting and decreased coordination results in premature spillage of barium into pharynx - worse with liquids than solids.  Retention of liquids in oral cavity noted that prematurely spills into pharynx.  Pt takes very large boluses and controlling to small boluses improved airway protection. Penetration and   aspiration - trace- occurred before, during and after po intake.  Pt did not have a reflexive cough with aspiration but aspiration was minimal.  Pharyngeal swallow delayed with puree- with boluses spilling over epiglottis into open larynx but adequately cleared with swallow.  Epiglottis is in anterior position, obliterating vallecular space - allow boluses to spill over epiglottis.  Mild retention in pharynx noted which pt did not sense.  Cued dry swallow helped to clear but was delayed in performance.  Secretions also retained in pharynx - at pyriform sinus and spilled into open airway.  Pill given with pudding and cues to swallow "hard" readily cleared into esophagus without oropharyngeal retention.  Upon verbal cue to cough and expectorate following MBS, pt expectorated large bolus of viscous secretions.  Recommend follow up with [facility] SLP.  Educated son and pt to findings/recommendations and advise consider thicker liquids with meals, medicine with puree, thin water between meals to assure hydrated. Cough with" hock" advised if reflexively coughing during meals - and small frequent meals  may be helpful.  Thanks for this referral. SLP Visit Diagnosis Dysphagia, oropharyngeal phase (R13.12) Attention and concentration deficit following -- Frontal lobe and executive function deficit following -- Impact on safety and function Mild aspiration risk   CHL IP TREATMENT RECOMMENDATION 08/16/2020 Treatment Recommendations Therapy as outlined in treatment plan below   Prognosis 08/16/2020 Prognosis for Safe Diet Advancement Good Barriers to Reach Goals Cognitive deficits Barriers/Prognosis Comment -- CHL IP DIET RECOMMENDATION 11/09/2020 SLP Diet Recommendations Dysphagia 3 (Mech soft) solids;Thin liquid;Nectar thick liquid Liquid Administration via Cup;Straw Medication Administration Whole meds with liquid Compensations Slow rate;Small sips/bites Postural Changes Seated upright at 90 degrees;Remain semi-upright after after feeds/meals (Comment)   No flowsheet data found.  CHL IP FOLLOW UP RECOMMENDATIONS 08/17/2020 Follow up Recommendations 24 hour supervision/assistance   CHL IP FREQUENCY AND DURATION 08/16/2020 Speech Therapy Frequency (ACUTE ONLY) min 1 x/week Treatment Duration 1 week      CHL IP ORAL PHASE 11/09/2020 Oral Phase Impaired Oral - Pudding Teaspoon -- Oral - Pudding Cup -- Oral - Honey Teaspoon -- Oral - Honey Cup -- Oral - Nectar Teaspoon -- Oral - Nectar Cup Delayed oral transit;Decreased bolus cohesion;Premature spillage;Lingual/palatal residue;Holding of bolus Oral - Nectar Straw -- Oral - Thin Teaspoon -- Oral - Thin Cup Premature spillage;Decreased bolus cohesion;Delayed oral transit;Lingual/palatal residue;Reduced posterior propulsion Oral - Thin Straw Delayed oral transit;Decreased bolus cohesion;Premature spillage;Reduced posterior propulsion;Lingual/palatal residue;Weak lingual manipulation;Piecemeal swallowing Oral - Puree Weak lingual manipulation;Reduced posterior propulsion;Decreased bolus cohesion;Delayed oral transit;Holding of bolus Oral - Mech Soft Reduced posterior  propulsion;Lingual/palatal residue;Piecemeal swallowing;Weak lingual manipulation;Impaired mastication;Decreased bolus cohesion;Delayed oral transit;Premature spillage Oral - Regular -- Oral - Multi-Consistency -- Oral - Pill Premature spillage;Decreased bolus cohesion;Delayed oral transit Oral Phase - Comment --  CHL IP PHARYNGEAL PHASE 11/09/2020 Pharyngeal Phase Impaired Pharyngeal- Pudding Teaspoon -- Pharyngeal -- Pharyngeal- Pudding Cup -- Pharyngeal -- Pharyngeal- Honey Teaspoon -- Pharyngeal -- Pharyngeal- Honey Cup -- Pharyngeal -- Pharyngeal- Nectar Teaspoon -- Pharyngeal -- Pharyngeal- Nectar Cup Delayed swallow initiation-vallecula Pharyngeal Material enters airway, remains ABOVE vocal cords and not ejected out Pharyngeal- Nectar Straw -- Pharyngeal -- Pharyngeal-  Thin Teaspoon -- Pharyngeal -- Pharyngeal- Thin Cup Penetration/Aspiration during swallow;Penetration/Aspiration before swallow;Penetration/Apiration after swallow Pharyngeal Material enters airway, passes BELOW cords without attempt by patient to eject out (silent aspiration);Material enters airway, remains ABOVE vocal cords and not ejected out Pharyngeal- Thin Straw Penetration/Aspiration before swallow;Penetration/Aspiration during swallow;Trace aspiration;Penetration/Apiration after swallow Pharyngeal Material enters airway, passes BELOW cords without attempt by patient to eject out (silent aspiration);Material enters airway, passes BELOW cords then ejected out Pharyngeal- Puree Delayed swallow initiation-vallecula Pharyngeal Material does not enter airway Pharyngeal- Mechanical Soft WFL;Delayed swallow initiation-vallecula Pharyngeal Material does not enter airway Pharyngeal- Regular -- Pharyngeal -- Pharyngeal- Multi-consistency -- Pharyngeal -- Pharyngeal- Pill WFL Pharyngeal Material does not enter airway Pharyngeal Comment Oral retention spills into pharynx and pt does not sense this to elicit swallow. Cued dry swallow effective to  elicit swallow x1 to help to clear pharynx but pt was not able to consistently perform. Chin tuck posture opens vallecular space to allow pooling of boluses however also dumps retention and secretions from pyriform sinus into larynx. Aspiration was minimal to trace and primarily present due to decreased oral control, large bolus consumption causing spillage into airway.  CHL IP CERVICAL ESOPHAGEAL PHASE 11/09/2020 Cervical Esophageal Phase Impaired Pudding Teaspoon -- Pudding Cup -- Honey Teaspoon -- Honey Cup -- Nectar Teaspoon -- Nectar Cup -- Nectar Straw -- Thin Teaspoon -- Thin Cup -- Thin Straw -- Puree -- Mechanical Soft -- Regular -- Multi-consistency -- Pill -- Cervical Esophageal Comment appearance of prominent cricopharyngeus with minimal decreased opening allow minimal retention of barium x2.  Barium tablet with pudding easily transited through oropharynx. Kathleen Lime, MS [facility] SLP Acute Rehab Services Office [phone] Pager [phone] Macario Golds 11/09/2020, 2:54 PM        CLINICAL DATA:  Dysphagia. EXAM: MODIFIED BARIUM SWALLOW TECHNIQUE: Different consistencies of barium were administered orally to the patient by the Speech Pathologist. Imaging of the pharynx was performed in the lateral projection. The radiologist was present in the fluoroscopy room for this study, providing personal supervision. FLUOROSCOPY TIME:  Fluoroscopy Time:  2 minutes and 54 seconds Radiation Exposure Index (if provided by the fluoroscopic device): 9.2 mGy Number of Acquired Spot Images: 0 COMPARISON:  None. FINDINGS: With thin liquids, there is flash penetration and delayed swallow trigger. Post swallow retention. With puree thickness, premature spill and delayed swallow trigger. Post swallow retention. With nectar thickness, delayed oral transit and flash penetration. Post swallow retention. With puree with a cracker, delayed swallow trigger and post swallow retention. A 13 mm barium tablet with puree has delayed  passage in the mid esophagus, likely secondary to dysmotility. IMPRESSION: Swallow dysfunction, as detailed above. Please refer to the Speech Pathologists report for complete details and recommendations. Electronically Signed   By: Abigail Miyamoto M.D.   On: 11/09/2020 14:11   DG Knee Complete 4 Views Right  Result Date: 12/06/2020 CLINICAL DATA:  Right knee pain.  No known injury. EXAM: RIGHT KNEE - COMPLETE 4+ VIEW COMPARISON:  No recent prior. FINDINGS: No evidence of effusion. Diffuse osteopenia. Very mild tricompartment degenerative change. No acute bony or joint abnormality. No evidence of fracture dislocation. Peripheral vascular calcification. IMPRESSION: 1. Diffuse osteopenia and very mild degenerative change. No acute bony abnormality. 2.  Peripheral vascular disease. Electronically Signed   By: Marcello Moores  Register   On: 12/06/2020 14:18   ECHOCARDIOGRAM COMPLETE  Result Date: 12/07/2020    ECHOCARDIOGRAM REPORT   Patient Name:   DEVEN FURIA Date of Exam: 12/07/2020 Medical Rec #:  [id number]        Height:       67.0 in Accession #:    [id number]       Weight:       169.5 lb Date of Birth:  [birth year]        BSA:          1.885 m Patient Age:    58 years         BP:           160/78 mmHg Patient Gender: M                HR:           74 bpm. Exam Location:  Inpatient Procedure: 2D Echo, Cardiac Doppler and Color Doppler Indications:    Aortic Stenosis                 Syncope  History:        Patient has prior history of Echocardiogram examinations, most                 recent 06/29/2020. Previous Myocardial Infarction, TIA and                 Stroke, Arrythmias:Atrial Fibrillation, Signs/Symptoms:Syncope;                 Risk Factors:Hypertension.  Sonographer:    Luisa Hart RDCS Referring Phys: [place]  1. Left ventricular ejection fraction, by estimation, is 60 to 65%. The left ventricle has normal function. The left ventricle has no regional wall motion abnormalities.  Left ventricular diastolic parameters are consistent with Grade I diastolic dysfunction (impaired relaxation). The average left ventricular global longitudinal strain is -17.8 %. The global longitudinal strain is normal.  2. Right ventricular systolic function is normal. The right ventricular size is normal. There is mildly elevated pulmonary artery systolic pressure.  3. The mitral valve is normal in structure. No evidence of mitral valve regurgitation. No evidence of mitral stenosis. Moderate mitral annular calcification.  4. The aortic valve is calcified. There is severe calcifcation of the aortic valve. There is severe thickening of the aortic valve. Aortic valve regurgitation is not visualized. Moderate aortic valve stenosis. Aortic valve area, by VTI measures 1.59 cm. Aortic valve mean gradient measures 24.0 mmHg. Aortic valve Vmax measures 3.11 m/s.  5. The inferior vena cava is normal in size with greater than 50% respiratory variability, suggesting right atrial pressure of 3 mmHg. Comparison(s): No significant change from prior study. Prior images reviewed side by side. FINDINGS  Left Ventricle: Left ventricular ejection fraction, by estimation, is 60 to 65%. The left ventricle has normal function. The left ventricle has no regional wall motion abnormalities. The average left ventricular global longitudinal strain is -17.8 %. The global longitudinal strain is normal. The left ventricular internal cavity size was normal in size. There is no left ventricular hypertrophy. Left ventricular diastolic parameters are consistent with Grade I diastolic dysfunction (impaired relaxation). Right Ventricle: The right ventricular size is normal. No increase in right ventricular wall thickness. Right ventricular systolic function is normal. There is mildly elevated pulmonary artery systolic pressure. The tricuspid regurgitant velocity is 3.12  m/s, and with an assumed right atrial pressure of 3 mmHg, the estimated right  ventricular systolic pressure is 42.5 mmHg. Left Atrium: Left atrial size was normal in size. Right Atrium: Right atrial size was normal in size. Pericardium: There is no evidence of pericardial effusion. Mitral  Valve: The mitral valve is normal in structure. Moderate mitral annular calcification. No evidence of mitral valve regurgitation. No evidence of mitral valve stenosis. MV peak gradient, 6.1 mmHg. The mean mitral valve gradient is 2.0 mmHg. Tricuspid Valve: The tricuspid valve is normal in structure. Tricuspid valve regurgitation is trivial. No evidence of tricuspid stenosis. Aortic Valve: The aortic valve is calcified. There is severe calcifcation of the aortic valve. There is severe thickening of the aortic valve. Aortic valve regurgitation is not visualized. Moderate aortic stenosis is present. Aortic valve mean gradient measures 24.0 mmHg. Aortic valve peak gradient measures 38.7 mmHg. Aortic valve area, by VTI measures 1.59 cm. Pulmonic Valve: The pulmonic valve was normal in structure. Pulmonic valve regurgitation is not visualized. No evidence of pulmonic stenosis. Aorta: The aortic root is normal in size and structure. Venous: The inferior vena cava is normal in size with greater than 50% respiratory variability, suggesting right atrial pressure of 3 mmHg. IAS/Shunts: No atrial level shunt detected by color flow Doppler.  LEFT VENTRICLE PLAX 2D LVIDd:         3.30 cm     Diastology LVIDs:         2.20 cm     LV e' medial:    6.38 cm/s LV PW:         1.30 cm     LV E/e' medial:  12.5 LV IVS:        1.30 cm     LV e' lateral:   5.63 cm/s LVOT diam:     2.40 cm     LV E/e' lateral: 14.1 LV SV:         111 LV SV Index:   59          2D Longitudinal Strain LVOT Area:     4.52 cm    2D Strain GLS Avg:     -17.8 %  LV Volumes (MOD) LV vol d, MOD A2C: 60.6 ml LV vol d, MOD A4C: 63.6 ml LV vol s, MOD A2C: 26.0 ml LV vol s, MOD A4C: 24.7 ml LV SV MOD A2C:     34.6 ml LV SV MOD A4C:     63.6 ml LV SV MOD BP:       41.6 ml RIGHT VENTRICLE RV S prime:     10.30 cm/s  PULMONARY VEINS TAPSE (M-mode): 1.1 cm      A Reversal Duration: 92.00 msec                             A Reversal Velocity: 24.90 cm/s                             Diastolic Velocity:  28.31 cm/s                             S/D Velocity:        1.60                             Systolic Velocity:   51.76 cm/s LEFT ATRIUM             Index       RIGHT ATRIUM           Index LA diam:  3.90 cm 2.07 cm/m  RA Area:     16.70 cm LA Vol (A2C):   44.9 ml 23.82 ml/m RA Volume:   44.00 ml  23.34 ml/m LA Vol (A4C):   53.7 ml 28.49 ml/m LA Biplane Vol: 49.1 ml 26.05 ml/m  AORTIC VALVE                    PULMONIC VALVE AV Area (Vmax):    1.63 cm     PV Vmax:       0.88 m/s AV Area (Vmean):   1.55 cm     PV Vmean:      64.500 cm/s AV Area (VTI):     1.59 cm     PV VTI:        0.223 m AV Vmax:           311.00 cm/s  PV Peak grad:  3.1 mmHg AV Vmean:          220.667 cm/s PV Mean grad:  2.0 mmHg AV VTI:            0.698 m AV Peak Grad:      38.7 mmHg AV Mean Grad:      24.0 mmHg LVOT Vmax:         111.93 cm/s LVOT Vmean:        75.767 cm/s LVOT VTI:          0.246 m LVOT/AV VTI ratio: 0.35  AORTA Ao Root diam: 3.50 cm Ao Asc diam:  3.50 cm MITRAL VALVE                TRICUSPID VALVE MV Area (PHT): 1.49 cm     TR Peak grad:   38.9 mmHg MV Area VTI:   3.05 cm     TR Vmax:        312.00 cm/s MV Peak grad:  6.1 mmHg MV Mean grad:  2.0 mmHg     SHUNTS MV Vmax:       1.23 m/s     Systemic VTI:  0.25 m MV Vmean:      64.5 cm/s    Systemic Diam: 2.40 cm MV Decel Time: 510 msec MV E velocity: 79.60 cm/s MV A velocity: 116.00 cm/s MV E/A ratio:  0.69 Candee Furbish MD Electronically signed by Candee Furbish MD Signature Date/Time: 12/07/2020/12:15:24 PM    Final       Discharge Exam: Vitals:   12/08/20 0500 12/08/20 1005  BP: (!) 174/81 (!) 152/77  Pulse: 65 62  Resp: 18   Temp: 97.6 F (36.4 C)   SpO2:     Vitals:   12/07/20 2100 12/08/20 0035 12/08/20 0500  12/08/20 1005  BP: (!) 150/68 138/79 (!) 174/81 (!) 152/77  Pulse: 72 (!) 58 65 62  Resp: 18 18 18    Temp: 98.9 F (37.2 C) 98.8 F (37.1 C) 97.6 F (36.4 C)   TempSrc: Axillary Axillary Axillary   SpO2: 98% 98%    Weight:   75.6 kg   Height:        General: Pt is alert, awake, not in acute distress Cardiovascular: RRR, S1/S2 +, no rubs, no gallops Respiratory: CTA bilaterally, no wheezing, no rhonchi Abdominal: Soft, NT, ND, bowel sounds + Extremities: no edema, no cyanosis    The results of significant diagnostics from this hospitalization (including imaging, microbiology, ancillary and laboratory) are listed below for reference.     Microbiology: Recent Results (from the past 240 hour(s))  Resp Panel by RT-PCR (Flu A&B, Covid) Nasopharyngeal Swab     Status: None   Collection Time: 12/06/20  1:38 PM   Specimen: Nasopharyngeal Swab; Nasopharyngeal(NP) swabs in vial transport medium  Result Value Ref Range Status   SARS Coronavirus 2 by RT PCR NEGATIVE NEGATIVE Final    Comment: (NOTE) SARS-CoV-2 target nucleic acids are NOT DETECTED.  The SARS-CoV-2 RNA is generally detectable in upper respiratory specimens during the acute phase of infection. The lowest concentration of SARS-CoV-2 viral copies this assay can detect is 138 copies/mL. A negative result does not preclude SARS-Cov-2 infection and should not be used as the sole basis for treatment or other patient management decisions. A negative result may occur with  improper specimen collection/handling, submission of specimen other than nasopharyngeal swab, presence of viral mutation(s) within the areas targeted by this assay, and inadequate number of viral copies(<138 copies/mL). A negative result must be combined with clinical observations, patient history, and epidemiological information. The expected result is Negative.  Fact Sheet for Patients:  EntrepreneurPulse.com.au  Fact Sheet for  Healthcare Providers:  IncredibleEmployment.be  This test is no t yet approved or cleared by the [place] FDA and  has been authorized for detection and/or diagnosis of SARS-CoV-2 by FDA under an Emergency Use Authorization (EUA). This EUA will remain  in effect (meaning this test can be used) for the duration of the COVID-19 declaration under Section 564(b)(1) of the Act, 21 U.S.C.section 360bbb-3(b)(1), unless the authorization is terminated  or revoked sooner.       Influenza A by PCR NEGATIVE NEGATIVE Final   Influenza B by PCR NEGATIVE NEGATIVE Final    Comment: (NOTE) The Xpert Xpress SARS-CoV-2/FLU/RSV plus assay is intended as an aid in the diagnosis of influenza from Nasopharyngeal swab specimens and should not be used as a sole basis for treatment. Nasal washings and aspirates are unacceptable for Xpert Xpress SARS-CoV-2/FLU/RSV testing.  Fact Sheet for Patients: EntrepreneurPulse.com.au  Fact Sheet for Healthcare Providers: IncredibleEmployment.be  This test is not yet approved or cleared by the [place] FDA and has been authorized for detection and/or diagnosis of SARS-CoV-2 by FDA under an Emergency Use Authorization (EUA). This EUA will remain in effect (meaning this test can be used) for the duration of the COVID-19 declaration under Section 564(b)(1) of the Act, 21 U.S.C. section 360bbb-3(b)(1), unless the authorization is terminated or revoked.  Performed at [facility], [place] [address], [place], [place] [address]      Labs: BNP (last 3 results) Recent Labs    06/28/20 1849 08/14/20 0027  BNP 133.2* 676.1*   Basic Metabolic Panel: Recent Labs  Lab 12/06/20 1147 12/07/20 0245 12/08/20 0212  NA 139 135 135  K 4.2 4.1 4.0  CL 103 105 104  CO2 28 24 23   GLUCOSE 127* 107* 83  BUN 34* 24* 26*  CREATININE 1.49* 1.13 1.46*  CALCIUM 9.6 9.4 9.1   Liver Function Tests: Recent  Labs  Lab 12/06/20 1147  AST 25  ALT 19  ALKPHOS 94  BILITOT 0.7  PROT 6.8  ALBUMIN 3.6   No results for input(s): LIPASE, AMYLASE in the last 168 hours. No results for input(s): AMMONIA in the last 168 hours. CBC: Recent Labs  Lab 12/06/20 1147 12/08/20 0212  WBC 9.9 9.6  NEUTROABS 7.0  --   HGB 13.9 13.2  HCT 42.4 38.5*  MCV 89.5 88.3  PLT 229 209   Cardiac Enzymes: No results for input(s): CKTOTAL, CKMB,  CKMBINDEX, TROPONINI in the last 168 hours. BNP: Invalid input(s): POCBNP CBG: Recent Labs  Lab 12/07/20 0703 12/08/20 0726  GLUCAP 96 84   D-Dimer No results for input(s): DDIMER in the last 72 hours. Hgb A1c No results for input(s): HGBA1C in the last 72 hours. Lipid Profile No results for input(s): CHOL, HDL, LDLCALC, TRIG, CHOLHDL, LDLDIRECT in the last 72 hours. Thyroid function studies Recent Labs    12/06/20 1624  TSH 1.291   Anemia work up No results for input(s): VITAMINB12, FOLATE, FERRITIN, TIBC, IRON, RETICCTPCT in the last 72 hours. Urinalysis    Component Value Date/Time   COLORURINE YELLOW 08/14/2020 0427   APPEARANCEUR HAZY (A) 08/14/2020 0427   LABSPEC 1.012 08/14/2020 0427   PHURINE 5.0 08/14/2020 0427   GLUCOSEU NEGATIVE 08/14/2020 0427   HGBUR NEGATIVE 08/14/2020 0427   BILIRUBINUR NEGATIVE 08/14/2020 0427   KETONESUR NEGATIVE 08/14/2020 0427   PROTEINUR 100 (A) 08/14/2020 0427   UROBILINOGEN 0.2 12/11/2014 1450   NITRITE NEGATIVE 08/14/2020 0427   LEUKOCYTESUR NEGATIVE 08/14/2020 0427   Sepsis Labs Invalid input(s): PROCALCITONIN,  WBC,  LACTICIDVEN Microbiology Recent Results (from the past 240 hour(s))  Resp Panel by RT-PCR (Flu A&B, Covid) Nasopharyngeal Swab     Status: None   Collection Time: 12/06/20  1:38 PM   Specimen: Nasopharyngeal Swab; Nasopharyngeal(NP) swabs in vial transport medium  Result Value Ref Range Status   SARS Coronavirus 2 by RT PCR NEGATIVE NEGATIVE Final    Comment: (NOTE) SARS-CoV-2 target  nucleic acids are NOT DETECTED.  The SARS-CoV-2 RNA is generally detectable in upper respiratory specimens during the acute phase of infection. The lowest concentration of SARS-CoV-2 viral copies this assay can detect is 138 copies/mL. A negative result does not preclude SARS-Cov-2 infection and should not be used as the sole basis for treatment or other patient management decisions. A negative result may occur with  improper specimen collection/handling, submission of specimen other than nasopharyngeal swab, presence of viral mutation(s) within the areas targeted by this assay, and inadequate number of viral copies(<138 copies/mL). A negative result must be combined with clinical observations, patient history, and epidemiological information. The expected result is Negative.  Fact Sheet for Patients:  EntrepreneurPulse.com.au  Fact Sheet for Healthcare Providers:  IncredibleEmployment.be  This test is no t yet approved or cleared by the [place] FDA and  has been authorized for detection and/or diagnosis of SARS-CoV-2 by FDA under an Emergency Use Authorization (EUA). This EUA will remain  in effect (meaning this test can be used) for the duration of the COVID-19 declaration under Section 564(b)(1) of the Act, 21 U.S.C.section 360bbb-3(b)(1), unless the authorization is terminated  or revoked sooner.       Influenza A by PCR NEGATIVE NEGATIVE Final   Influenza B by PCR NEGATIVE NEGATIVE Final    Comment: (NOTE) The Xpert Xpress SARS-CoV-2/FLU/RSV plus assay is intended as an aid in the diagnosis of influenza from Nasopharyngeal swab specimens and should not be used as a sole basis for treatment. Nasal washings and aspirates are unacceptable for Xpert Xpress SARS-CoV-2/FLU/RSV testing.  Fact Sheet for Patients: EntrepreneurPulse.com.au  Fact Sheet for Healthcare  Providers: IncredibleEmployment.be  This test is not yet approved or cleared by the [place] FDA and has been authorized for detection and/or diagnosis of SARS-CoV-2 by FDA under an Emergency Use Authorization (EUA). This EUA will remain in effect (meaning this test can be used) for the duration of the COVID-19 declaration under Section 564(b)(1) of the Act,  21 U.S.C. section 360bbb-3(b)(1), unless the authorization is terminated or revoked.  Performed at [facility], [place] [address] [place], [place] [address]      Time coordinating discharge: Over 30 minutes  SIGNED:   Darliss Cheney, MD  [facility] 12/08/2020, 12:04 PM  If 7PM-7AM, please contact night-coverage [email or web address]

## 2020-12-08 NOTE — Progress Notes (Signed)
Palliative Consult for Goals of Care received on 4/1.   Chart reviewed.  Patient is followed outpatient by [facility].  A discharge summary is already in Epic for today.   I called [facility] to make certain they knew of his hospitalization and to request that Palliative Care please follow up in the near term.   Patient is a DNR with Advanced Directives in [place].  Florentina Jenny, PA-C Palliative Medicine Office:  [phone]
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
## 2020-12-08 NOTE — Discharge Instructions (Signed)
Syncope Syncope is when you pass out (faint) for a short time. It is caused by a sudden decrease in blood flow to the brain. Signs that you may be about to pass out include:  Feeling dizzy or light-headed.  Feeling sick to your stomach (nauseous).  Seeing all white or all black.  Having cold, clammy skin. If you pass out, get help right away. Call your local emergency services (911 in the [place] Do not drive yourself to the hospital. Follow these instructions at home: Watch for any changes in your symptoms. Take these actions to stay safe and help with your symptoms: Lifestyle  Do not drive, use machinery, or play sports until your doctor says it is okay.  Do not drink alcohol.  Do not use any products that contain nicotine or tobacco, such as cigarettes and e-cigarettes. If you need help quitting, ask your doctor.  Drink enough fluid to keep your pee (urine) pale yellow. General instructions  Take over-the-counter and prescription medicines only as told by your doctor.  If you are taking blood pressure or heart medicine, sit up and stand up slowly. Spend a few minutes getting ready to sit and then stand. This can help you feel less dizzy.  Have someone stay with you until you feel stable.  If you start to feel like you might pass out, lie down right away and raise (elevate) your feet above the level of your heart. Breathe deeply and steadily. Wait until all of the symptoms are gone.  Keep all follow-up visits as told by your doctor. This is important. Get help right away if:  You have a very bad headache.  You pass out once or more than once.  You have pain in your chest, belly, or back.  You have a very fast or uneven heartbeat (palpitations).  It hurts to breathe.  You are bleeding from your mouth or your bottom (rectum).  You have black or tarry poop (stool).  You have jerky movements that you cannot control (seizure).  You are confused.  You have trouble  walking.  You are very weak.  You have vision problems. These symptoms may be an emergency. Do not wait to see if the symptoms will go away. Get medical help right away. Call your local emergency services (911 in the [place] Do not drive yourself to the hospital. Summary  Syncope is when you pass out (faint) for a short time. It is caused by a sudden decrease in blood flow to the brain.  Signs that you may be about to faint include feeling dizzy, light-headed, or sick to your stomach, seeing all white or all black, or having cold, clammy skin.  If you start to feel like you might pass out, lie down right away and raise (elevate) your feet above the level of your heart. Breathe deeply and steadily. Wait until all of the symptoms are gone. This information is not intended to replace advice given to you by your health care provider. Make sure you discuss any questions you have with your health care provider. Document Revised: 10/05/2019 Document Reviewed: 10/07/2017 Elsevier Patient Education  2021 [employer].

## 2020-12-08 NOTE — Evaluation (Signed)
Physical Therapy Evaluation Patient Details Name: Derek [name] MRN: [id number] DOB: 09/09/1926 Today's Date: 12/08/2020   History of Present Illness  87 y/o male presented to ED on 3/31 following syncopal episode. PMH: CVD, PAF, aortic stenosis, CAD s/p PCI, progressive dementia, dysphagia, syncope.  Clinical Impression  PTA, patient lives with son and per son, patient with functional decline in past week. Patient was ambulating with RW and supervision. Patient had PCA 8 hours/day for 7 days/week. Patient presents with generalized weakness, impaired balance, decreased activity tolerance, and impaired functional mobility. Patient minA for bed mobility and sit to stand transfer. Patient required modA for 2' ambulation with RW due to crouched gait and balance. Educated son about knee bracing and trial run to see if patient tolerates or benefits from brace, son in agreement as he was concerned about R knee pain that patient had complained about prior to admission. No complaints of pain during session. Patient will benefit from skilled PT services during acute stay to address listed deficits. Recommend HHPT following discharge.     Follow Up Recommendations Home health PT;Supervision/Assistance - 24 hour    Equipment Recommendations  None recommended by PT (patient owns necessary DME)    Recommendations for Other Services       Precautions / Restrictions Precautions Precautions: Fall Restrictions Weight Bearing Restrictions: No      Mobility  Bed Mobility Overal bed mobility: Needs Assistance Bed Mobility: Supine to Sit;Sit to Supine     Supine to sit: Min assist Sit to supine: Min assist   General bed mobility comments: minA for bringing R LE towards EOB and trunk elevation. MinA for return to supine with assist for guiding patient to supine    Transfers Overall transfer level: Needs assistance Equipment used: Rolling Coal Nearhood (2 wheeled) Transfers: Sit to/from Stand Sit to  Stand: Min assist         General transfer comment: minA for boost up into standing with RW. Able to stand statically with B UE support with min guard  Ambulation/Gait Ambulation/Gait assistance: Mod assist Gait Distance (Feet): 2 Feet Assistive device: Rolling Armetta Henri (2 wheeled) Gait Pattern/deviations: Step-to pattern;Decreased stride length;Trunk flexed;Narrow base of support;Antalgic Gait velocity: decreased   General Gait Details: Crouched gait due to fatigue with patient requesting to return to bed. ModA for balance and cueing for sequencing  Stairs            Wheelchair Mobility    Modified Rankin (Stroke Patients Only)       Balance Overall balance assessment: Needs assistance Sitting-balance support: No upper extremity supported;Feet supported Sitting balance-Leahy Scale: Fair Sitting balance - Comments: static sitting with close supervision   Standing balance support: Bilateral upper extremity supported;During functional activity Standing balance-Leahy Scale: Poor Standing balance comment: required modA for maintaining balance during activity. Min guard for static standing                             Pertinent Vitals/Pain Pain Assessment: No/denies pain    Home Living Family/patient expects to be discharged to:: Private residence Living Arrangements: Children Available Help at Discharge: Family;Available 24 hours/day;Personal care attendant Type of Home: House Home Access: Level entry;Ramped entrance     Home Layout: One level Home Equipment: Ambyr Qadri - 2 wheels;Wheelchair - manual Additional Comments: Some information gathered from chart review and some from son    Prior Function Level of Independence: Needs assistance   Gait / Transfers Assistance Needed: ambulated  with RW and supervision but recently with functional decline after HHPT signed off.     Comments: Per son, PCA 8 hours/day for 7 days/week. Son is home 24/7 and able to  provide physical assistance     Hand Dominance        Extremity/Trunk Assessment   Upper Extremity Assessment Upper Extremity Assessment: Defer to OT evaluation    Lower Extremity Assessment Lower Extremity Assessment: Generalized weakness       Communication   Communication: No difficulties  Cognition Arousal/Alertness: Awake/alert Behavior During Therapy: Flat affect Overall Cognitive Status: History of cognitive impairments - at baseline                                 General Comments: hx of dementia. Oriented to self and place      General Comments      Exercises     Assessment/Plan    PT Assessment Patient needs continued PT services  PT Problem List Decreased strength;Decreased range of motion;Decreased activity tolerance;Decreased balance;Decreased mobility;Decreased cognition;Decreased safety awareness;Decreased knowledge of use of DME       PT Treatment Interventions DME instruction;Gait training;Functional mobility training;Therapeutic activities;Therapeutic exercise;Balance training;Patient/family education;Wheelchair mobility training    PT Goals (Current goals can be found in the Care Plan section)  Acute Rehab PT Goals Patient Stated Goal: did not state PT Goal Formulation: With family Time For Goal Achievement: 12/22/20 Potential to Achieve Goals: Fair    Frequency Min 3X/week   Barriers to discharge        Co-evaluation               AM-PAC PT "6 Clicks" Mobility  Outcome Measure Help needed turning from your back to your side while in a flat bed without using bedrails?: A Little Help needed moving from lying on your back to sitting on the side of a flat bed without using bedrails?: A Little Help needed moving to and from a bed to a chair (including a wheelchair)?: A Little Help needed standing up from a chair using your arms (e.g., wheelchair or bedside chair)?: A Little Help needed to walk in hospital room?: A  Lot Help needed climbing 3-5 steps with a railing? : A Lot 6 Click Score: 16    End of Session Equipment Utilized During Treatment: Gait belt Activity Tolerance: Patient limited by fatigue Patient left: in bed;with call bell/phone within reach;with bed alarm set;with nursing/sitter in room Nurse Communication: Mobility status PT Visit Diagnosis: Unsteadiness on feet (R26.81);Muscle weakness (generalized) (M62.81);Difficulty in walking, not elsewhere classified (R26.2)    Time: 6384-5364 PT Time Calculation (min) (ACUTE ONLY): 34 min   Charges:   PT Evaluation $PT Eval Moderate Complexity: 1 Mod PT Treatments $Therapeutic Activity: 8-22 mins        Tersa Fotopoulos A. Gilford Rile PT, DPT Acute Rehabilitation Services Pager [phone] Office [phone]   Linna Hoff 12/08/2020, 12:58 PM

## 2020-12-11 NOTE — Progress Notes (Signed)
Cardiology Office Note:    Date:  12/12/2020   ID:  Derek [name], DOB 1927-06-16, MRN [id number]  PCP:  Seward Carol, MD  Cardiologist:  Sinclair Grooms, MD   Referring MD: Seward Carol, MD   Chief Complaint  Patient presents with  . Cardiac Valve Problem  . Loss of Consciousness    Recurrent episodes related to orthostasis.    History of Present Illness:    Derek [name] is a 87 y.o. male with a hx of  CAD s/p BMS to CFx (2014), PAF on Eliquis, embolic CVA, OSA non complaint with CPAP, mild AS,CKDstage III, dementia, recurrent syncope,HIGH RISK NUCLEAR STRESS TEST 2015(high grade disease in the first diagonal, fifth OM and PDA accounting for the high risk abnormality).  He is accompanied by his son.  He had recent hospital admission after developing a nosebleed followed by an ashen color and syncope.  He did not have a heart attack or other issues.  His overall cognitive function has declined.  He does not engage in spontaneous conversation.  He is in a wheelchair which is being controlled by his son.  Past Medical History:  Diagnosis Date  . Broken shoulder   . Cerebrovascular disease    MRI/MRA in 2007 - 50% stenosis of supraclinoid ICA, left  . Clavicle fracture   . Closed wedge compression fracture of T1 vertebra ([place]) 02/20/2018  . Complex sleep apnea syndrome    adapt SV titration EEP 6 min 6 and max 15 cm water.   . Compression fracture   . Coronary atherosclerosis of native coronary artery 07/12/2013   a. NSTEMI in setting of AF with RVR 11/14 >> BMS to CFX;  b. Nuclear (9/15):  High risk - apical lateral ischemia >> c. LHC (9/15):  pLAD 50%, D1 (smaller br 90%/larger br 80%, pDx 50-60%, dLAD 50%, prox-mid CFX stent ok, pOM1 and OM2 50-70% (jailed by stent), OM5 70-80%, mPDA 90% (CFX dsz unchanged from 2014), RCA diff dsz, EF 55%, mild ant-apical HK >>  Med Rx  . CVA (cerebral vascular accident) ([place])   . Dementia ([place])   . Dementia ([place])    per son  .  History of pneumonia   . Hx of echocardiogram    a. Echo 11/14): EF 55-60%, no RWMA, grade 2 DD, mild aortic stenosis (mean 15 mmHg), mild LAE  . Hypertension   . Lung nodule < 6cm on CT 02/20/2018  . Memory loss   . MI (myocardial infarction) ([place]) 07/2013  . PAF (paroxysmal atrial fibrillation) ([facility])    hx of NSTEMI in setting of AF with RVR 07/2013;  Eliquis for AC  . Shingles    left chest  . Syncope   . TIA (transient ischemic attack) 07/12/2013    Past Surgical History:  Procedure Laterality Date  . CATARACT EXTRACTION Bilateral   . KYPHOPLASTY    . LEFT HEART CATHETERIZATION WITH CORONARY ANGIOGRAM N/A 08/08/2013   Procedure: LEFT HEART CATHETERIZATION WITH CORONARY ANGIOGRAM;  Surgeon: Jettie Booze, MD;  Location: [facility] CATH LAB;  Service: Cardiovascular;  Laterality: N/A;  . LEFT HEART CATHETERIZATION WITH CORONARY ANGIOGRAM N/A 05/29/2014   Procedure: LEFT HEART CATHETERIZATION WITH CORONARY ANGIOGRAM;  Surgeon: Sinclair Grooms, MD;  Location: [facility] CATH LAB;  Service: Cardiovascular;  Laterality: N/A;  . PERCUTANEOUS STENT INTERVENTION  08/08/2013   Procedure: PERCUTANEOUS STENT INTERVENTION;  Surgeon: Jettie Booze, MD;  Location: [facility] CATH LAB;  Service: Cardiovascular;;  . SHOULDER SURGERY  Left   . TONSILLECTOMY      Current Medications: Current Meds  Medication Sig  . atorvastatin (LIPITOR) 40 MG tablet TAKE 1 TABLET ONCE DAILY AT 6 PM. (Patient taking differently: Take 40 mg by mouth at bedtime.)  . Cholecalciferol (VITAMIN D3) 25 MCG (1000 UT) CAPS Take 1,000 Units by mouth daily.  Marland Kitchen donepezil (ARICEPT) 10 MG tablet Take 0.5 tablets (5 mg total) by mouth at bedtime. (Patient taking differently: Take 10 mg by mouth at bedtime.)  . ELIQUIS 2.5 MG TABS tablet TAKE 1 TABLET BY MOUTH TWICE DAILY. (Patient taking differently: Take 2.5 mg by mouth in the morning and at bedtime.)  . metoprolol tartrate (LOPRESSOR) 25 MG tablet TAKE 1 TABLET BY MOUTH TWICE DAILY.  (Patient taking differently: Take 25 mg by mouth in the morning and at bedtime.)  . Multiple Vitamins-Minerals (CENTRUM SILVER PO) Take 1 tablet by mouth daily.   . Multiple Vitamins-Minerals (PRESERVISION AREDS 2+MULTI VIT) CAPS Take 1 capsule by mouth in the morning and at bedtime.  . nitroGLYCERIN (NITROSTAT) 0.4 MG SL tablet Place 1 tablet (0.4 mg total) under the tongue every 5 (five) minutes as needed for chest pain. (Patient taking differently: Place 0.4 mg under the tongue every 5 (five) minutes x 3 doses as needed for chest pain.)  . PRILOSEC OTC 20 MG tablet Take 20 mg by mouth daily before breakfast.  . [DISCONTINUED] amLODipine (NORVASC) 5 MG tablet Take 5 mg by mouth daily.     Allergies:   Amoxicillin-pot clavulanate, Hydrochlorothiazide, Keppra [levetiracetam], Penicillins, and Sulfa antibiotics   Social History   Socioeconomic History  . Marital status: Widowed    Spouse name: Not on file  . Number of children: 2  . Years of education: Engineer, maintenance (IT)  . Highest education level: Not on file  Occupational History  . Occupation: retired  Tobacco Use  . Smoking status: Never Smoker  . Smokeless tobacco: Never Used  Substance and Sexual Activity  . Alcohol use: Yes    Alcohol/week: 1.0 standard drink    Types: 1 Glasses of wine per week    Comment: occasionally  . Drug use: No  . Sexual activity: Not on file  Other Topics Concern  . Not on file  Social History Narrative   Patient is widowed Derek [name]), has 2 children   Patient is right handed   Education level is 4 year degree.   Caffeine consumption is 2-4 cups daily   Social Determinants of Health   Financial Resource Strain: Not on file  Food Insecurity: Not on file  Transportation Needs: Not on file  Physical Activity: Not on file  Stress: Not on file  Social Connections: Not on file     Family History: The patient's family history includes Cancer in his daughter; Cancer - Lung in his brother; Diabetes in his  father and mother; Gait disorder in his brother; Stroke in his sister. There is no history of Heart attack.  ROS:   Please see the history of present illness.    His son lives with him.  The son mentions that he feels his father is declining.  He is asking technical issues concerning blood pressure medications and tapering.  He also wants information concerning an ideal blood pressure range.  I think as long as he is between 140 and 245 mmHg systolic we should be happy.  I would not try to control his blood pressure is tighter than that because it makes it much more likely that  he will faint.  I tried to make this clear to the son.  He should stay on low-dose beta-blocker and amlodipine low-dose as is.  We did discuss palliative care and hospice has a transitional stop for management.  That seems very appropriate to me.  All other systems reviewed and are negative.  EKGs/Labs/Other Studies Reviewed:    The following studies were reviewed today:  2 D Doppler Echocardiogram 12/07/2020: IMPRESSIONS    1. Left ventricular ejection fraction, by estimation, is 60 to 65%. The  left ventricle has normal function. The left ventricle has no regional  wall motion abnormalities. Left ventricular diastolic parameters are  consistent with Grade I diastolic  dysfunction (impaired relaxation). The average left ventricular global  longitudinal strain is -17.8 %. The global longitudinal strain is normal.  2. Right ventricular systolic function is normal. The right ventricular  size is normal. There is mildly elevated pulmonary artery systolic  pressure.  3. The mitral valve is normal in structure. No evidence of mitral valve  regurgitation. No evidence of mitral stenosis. Moderate mitral annular  calcification.  4. The aortic valve is calcified. There is severe calcifcation of the  aortic valve. There is severe thickening of the aortic valve. Aortic valve  regurgitation is not visualized. Moderate  aortic valve stenosis. Aortic  valve area, by VTI measures 1.59  cm. Aortic valve mean gradient measures 24.0 mmHg. Aortic valve Vmax  measures 3.11 m/s.  5. The inferior vena cava is normal in size with greater than 50%  respiratory variability, suggesting right atrial pressure of 3 mmHg.   Comparison(s): No significant change from prior study. Prior images  reviewed side by side.   EKG:  EKG not performed  Recent Labs: 08/14/2020: B Natriuretic Peptide 265.3 08/15/2020: Magnesium 1.7 12/06/2020: ALT 19; TSH 1.291 12/08/2020: BUN 26; Creatinine, Ser 1.46; Hemoglobin 13.2; Platelets 209; Potassium 4.0; Sodium 135  Recent Lipid Panel    Component Value Date/Time   CHOL 100 12/27/2014 0916   TRIG 67.0 12/27/2014 0916   HDL 29.50 (L) 12/27/2014 0916   CHOLHDL 3 12/27/2014 0916   VLDL 13.4 12/27/2014 0916   LDLCALC 57 12/27/2014 0916    Physical Exam:    VS:  BP (!) 156/76   Pulse (!) 58   Ht 5\' 7"  (1.702 m)   Wt 186 lb (84.4 kg)   BMI 29.13 kg/m     Wt Readings from Last 3 Encounters:  12/12/20 186 lb (84.4 kg)  12/08/20 166 lb 10.7 oz (75.6 kg)  11/08/20 178 lb 9.6 oz (81 kg)     GEN: Staring with expressionless face.  Will occasionally track with eyes.  No emotion.. No acute distress HEENT: Normal NECK: No JVD. LYMPHATICS: No lymphadenopathy CARDIAC: 3/6 to 4/6 crescendo decrescendo systolic murmur.  Murmur. RRR no gallop, or edema. VASCULAR:  Normal Pulses. No bruits. RESPIRATORY:  Clear to auscultation without rales, wheezing or rhonchi  ABDOMEN: Soft, non-tender, non-distended, No pulsatile mass, MUSCULOSKELETAL: No deformity  SKIN: Warm and dry NEUROLOGIC:  Alert and oriented x 3 PSYCHIATRIC:  Normal affect   ASSESSMENT:    1. Coronary artery disease involving native coronary artery of native heart without angina pectoris   2. History of syncope   3. Paroxysmal atrial fibrillation (HCC)   4. Essential hypertension   5. Aortic valve stenosis, etiology of  cardiac valve disease unspecified   6. Hyperlipidemia, unspecified hyperlipidemia type    PLAN:    In order of problems listed above:  1.  Stable without angina 2. Tends to have recurrent episodes of syncope.  Management strategy has been to permit resting blood pressures to be higher than usual to avoid sudden drops that could cause transient cerebral hypoperfusion and syncope. 3. Continue low-dose beta-blocker therapy 4. Blood pressure today is a little high but not alarming and actually and what I would consider to be a reasonable range for him. 5. Exam is consistent with significant aortic stenosis. 6. We did not discuss lipids.  Recommended that the family consider palliative care and transition to hospice.  No real need for continuous cardiology follow-up.  However we will be happy to see him in the future if necessary.   Medication Adjustments/Labs and Tests Ordered: Current medicines are reviewed at length with the patient today.  Concerns regarding medicines are outlined above.  No orders of the defined types were placed in this encounter.  Meds ordered this encounter  Medications  . amLODipine (NORVASC) 5 MG tablet    Sig: Take 1 tablet (5 mg total) by mouth daily.    Dispense:  90 tablet    Refill:  3    Patient Instructions  Medication Instructions:  Your physician recommends that you continue on your current medications as directed. Please refer to the Current Medication list given to you today.  *If you need a refill on your cardiac medications before your next appointment, please call your pharmacy*   Lab Work: None If you have labs (blood work) drawn today and your tests are completely normal, you will receive your results only by: Marland Kitchen MyChart Message (if you have MyChart) OR . A paper copy in the mail If you have any lab test that is abnormal or we need to change your treatment, we will call you to review the  results.   Testing/Procedures: None   Follow-Up: At [facility], you and your health needs are our priority.  As part of our continuing mission to provide you with exceptional heart care, we have created designated Provider Care Teams.  These Care Teams include your primary Cardiologist (physician) and Advanced Practice Providers (APPs -  Physician Assistants and Nurse Practitioners) who all work together to provide you with the care you need, when you need it.  We recommend signing up for the patient portal called "MyChart".  Sign up information is provided on this After Visit Summary.  MyChart is used to connect with patients for Virtual Visits (Telemedicine).  Patients are able to view lab/test results, encounter notes, upcoming appointments, etc.  Non-urgent messages can be sent to your provider as well.   To learn more about what you can do with MyChart, go to [email or web address].    Your next appointment:   As needed  The format for your next appointment:   In Person  Provider:   You may see Sinclair Grooms, MD or one of the following Advanced Practice Providers on your designated Care Team:    Kathyrn Drown, NP    Other Instructions      Signed, Sinclair Grooms, MD  12/12/2020 5:32 PM    [place]

## 2020-12-12 ENCOUNTER — Encounter: Payer: Self-pay | Admitting: Interventional Cardiology

## 2020-12-12 ENCOUNTER — Other Ambulatory Visit: Payer: Self-pay

## 2020-12-12 ENCOUNTER — Ambulatory Visit (INDEPENDENT_AMBULATORY_CARE_PROVIDER_SITE_OTHER): Payer: Medicare Other | Admitting: Interventional Cardiology

## 2020-12-12 VITALS — BP 156/76 | HR 58 | Ht 67.0 in | Wt 186.0 lb

## 2020-12-12 DIAGNOSIS — E785 Hyperlipidemia, unspecified: Secondary | ICD-10-CM | POA: Diagnosis not present

## 2020-12-12 DIAGNOSIS — Z87898 Personal history of other specified conditions: Secondary | ICD-10-CM

## 2020-12-12 DIAGNOSIS — I2510 Atherosclerotic heart disease of native coronary artery without angina pectoris: Secondary | ICD-10-CM

## 2020-12-12 DIAGNOSIS — I10 Essential (primary) hypertension: Secondary | ICD-10-CM | POA: Diagnosis not present

## 2020-12-12 DIAGNOSIS — I350 Nonrheumatic aortic (valve) stenosis: Secondary | ICD-10-CM | POA: Diagnosis not present

## 2020-12-12 DIAGNOSIS — I480 Paroxysmal atrial fibrillation: Secondary | ICD-10-CM

## 2020-12-12 MED ORDER — AMLODIPINE BESYLATE 5 MG PO TABS: 5.0000 mg | ORAL_TABLET | Freq: Every day | ORAL | 3 refills | Status: DC

## 2020-12-12 NOTE — Patient Instructions (Signed)
Medication Instructions:  Your physician recommends that you continue on your current medications as directed. Please refer to the Current Medication list given to you today.  *If you need a refill on your cardiac medications before your next appointment, please call your pharmacy*   Lab Work: None If you have labs (blood work) drawn today and your tests are completely normal, you will receive your results only by: MyChart Message (if you have MyChart) OR A paper copy in the mail If you have any lab test that is abnormal or we need to change your treatment, we will call you to review the results.   Testing/Procedures: None   Follow-Up: At [facility], you and your health needs are our priority.  As part of our continuing mission to provide you with exceptional heart care, we have created designated Provider Care Teams.  These Care Teams include your primary Cardiologist (physician) and Advanced Practice Providers (APPs -  Physician Assistants and Nurse Practitioners) who all work together to provide you with the care you need, when you need it.  We recommend signing up for the patient portal called "MyChart".  Sign up information is provided on this After Visit Summary.  MyChart is used to connect with patients for Virtual Visits (Telemedicine).  Patients are able to view lab/test results, encounter notes, upcoming appointments, etc.  Non-urgent messages can be sent to your provider as well.   To learn more about what you can do with MyChart, go to [email or web address].    Your next appointment:   As needed  The format for your next appointment:   In Person  Provider:   You may see Henry W Smith III, MD or one of the following Advanced Practice Providers on your designated Care Team:   Jill McDaniel, NP   Other Instructions   

## 2020-12-18 DIAGNOSIS — I129 Hypertensive chronic kidney disease with stage 1 through stage 4 chronic kidney disease, or unspecified chronic kidney disease: Secondary | ICD-10-CM | POA: Diagnosis not present

## 2020-12-18 DIAGNOSIS — J449 Chronic obstructive pulmonary disease, unspecified: Secondary | ICD-10-CM | POA: Diagnosis not present

## 2020-12-18 DIAGNOSIS — K219 Gastro-esophageal reflux disease without esophagitis: Secondary | ICD-10-CM | POA: Diagnosis not present

## 2020-12-18 DIAGNOSIS — I350 Nonrheumatic aortic (valve) stenosis: Secondary | ICD-10-CM | POA: Diagnosis not present

## 2020-12-18 DIAGNOSIS — E785 Hyperlipidemia, unspecified: Secondary | ICD-10-CM | POA: Diagnosis not present

## 2020-12-18 DIAGNOSIS — I252 Old myocardial infarction: Secondary | ICD-10-CM | POA: Diagnosis not present

## 2020-12-18 DIAGNOSIS — Z8701 Personal history of pneumonia (recurrent): Secondary | ICD-10-CM | POA: Diagnosis not present

## 2020-12-18 DIAGNOSIS — E440 Moderate protein-calorie malnutrition: Secondary | ICD-10-CM | POA: Diagnosis not present

## 2020-12-18 DIAGNOSIS — I2510 Atherosclerotic heart disease of native coronary artery without angina pectoris: Secondary | ICD-10-CM | POA: Diagnosis not present

## 2020-12-18 DIAGNOSIS — G629 Polyneuropathy, unspecified: Secondary | ICD-10-CM | POA: Diagnosis not present

## 2020-12-18 DIAGNOSIS — I700 Atherosclerosis of aorta: Secondary | ICD-10-CM | POA: Diagnosis not present

## 2020-12-18 DIAGNOSIS — Z8673 Personal history of transient ischemic attack (TIA), and cerebral infarction without residual deficits: Secondary | ICD-10-CM | POA: Diagnosis not present

## 2020-12-18 DIAGNOSIS — Z7901 Long term (current) use of anticoagulants: Secondary | ICD-10-CM | POA: Diagnosis not present

## 2020-12-18 DIAGNOSIS — F0280 Dementia in other diseases classified elsewhere without behavioral disturbance: Secondary | ICD-10-CM | POA: Diagnosis not present

## 2020-12-18 DIAGNOSIS — I480 Paroxysmal atrial fibrillation: Secondary | ICD-10-CM | POA: Diagnosis not present

## 2020-12-18 DIAGNOSIS — R1310 Dysphagia, unspecified: Secondary | ICD-10-CM | POA: Diagnosis not present

## 2020-12-18 DIAGNOSIS — F32A Depression, unspecified: Secondary | ICD-10-CM | POA: Diagnosis not present

## 2020-12-18 DIAGNOSIS — Z9181 History of falling: Secondary | ICD-10-CM | POA: Diagnosis not present

## 2020-12-18 DIAGNOSIS — G4733 Obstructive sleep apnea (adult) (pediatric): Secondary | ICD-10-CM | POA: Diagnosis not present

## 2020-12-18 DIAGNOSIS — N1831 Chronic kidney disease, stage 3a: Secondary | ICD-10-CM | POA: Diagnosis not present

## 2020-12-18 DIAGNOSIS — N400 Enlarged prostate without lower urinary tract symptoms: Secondary | ICD-10-CM | POA: Diagnosis not present

## 2020-12-18 DIAGNOSIS — G40909 Epilepsy, unspecified, not intractable, without status epilepticus: Secondary | ICD-10-CM | POA: Diagnosis not present

## 2020-12-20 DIAGNOSIS — J449 Chronic obstructive pulmonary disease, unspecified: Secondary | ICD-10-CM | POA: Diagnosis not present

## 2020-12-20 DIAGNOSIS — I129 Hypertensive chronic kidney disease with stage 1 through stage 4 chronic kidney disease, or unspecified chronic kidney disease: Secondary | ICD-10-CM | POA: Diagnosis not present

## 2020-12-20 DIAGNOSIS — N1831 Chronic kidney disease, stage 3a: Secondary | ICD-10-CM | POA: Diagnosis not present

## 2020-12-20 DIAGNOSIS — G40909 Epilepsy, unspecified, not intractable, without status epilepticus: Secondary | ICD-10-CM | POA: Diagnosis not present

## 2020-12-20 DIAGNOSIS — F0280 Dementia in other diseases classified elsewhere without behavioral disturbance: Secondary | ICD-10-CM | POA: Diagnosis not present

## 2020-12-20 DIAGNOSIS — G629 Polyneuropathy, unspecified: Secondary | ICD-10-CM | POA: Diagnosis not present

## 2020-12-24 DIAGNOSIS — F0390 Unspecified dementia without behavioral disturbance: Secondary | ICD-10-CM | POA: Diagnosis not present

## 2020-12-24 DIAGNOSIS — R1310 Dysphagia, unspecified: Secondary | ICD-10-CM | POA: Diagnosis not present

## 2020-12-24 DIAGNOSIS — R600 Localized edema: Secondary | ICD-10-CM | POA: Diagnosis not present

## 2020-12-24 DIAGNOSIS — Z66 Do not resuscitate: Secondary | ICD-10-CM | POA: Diagnosis not present

## 2020-12-24 DIAGNOSIS — I350 Nonrheumatic aortic (valve) stenosis: Secondary | ICD-10-CM | POA: Diagnosis not present

## 2020-12-25 ENCOUNTER — Other Ambulatory Visit: Payer: Self-pay

## 2020-12-25 ENCOUNTER — Ambulatory Visit (INDEPENDENT_AMBULATORY_CARE_PROVIDER_SITE_OTHER): Payer: Medicare Other | Admitting: Podiatry

## 2020-12-25 ENCOUNTER — Encounter: Payer: Self-pay | Admitting: Podiatry

## 2020-12-25 DIAGNOSIS — M79674 Pain in right toe(s): Secondary | ICD-10-CM | POA: Diagnosis not present

## 2020-12-25 DIAGNOSIS — B351 Tinea unguium: Secondary | ICD-10-CM | POA: Insufficient documentation

## 2020-12-25 DIAGNOSIS — D689 Coagulation defect, unspecified: Secondary | ICD-10-CM | POA: Insufficient documentation

## 2020-12-25 DIAGNOSIS — M79675 Pain in left toe(s): Secondary | ICD-10-CM

## 2020-12-25 NOTE — Progress Notes (Signed)
This patient returns to my office for at risk foot care.  This patient requires this care by a professional since this patient will be at risk due to having CKD and coagulation defect.  Patient is taking eliquis.  He presents to the office in a wheelchair and presents to the office with his son and daughter.  This patient is unable to cut nails himself since the patient cannot reach his nails.These nails are painful walking and wearing shoes.  This patient presents for at risk foot care today.  General Appearance  Alert, conversant and in no acute stress.  Vascular  Dorsalis pedis and posterior tibial  pulses are not  palpable  bilaterally.  Capillary return is within normal limits  bilaterally. Cold feet. bilaterally.  Neurologic  Senn-Weinstein monofilament wire test within normal limits  bilaterally. Muscle power within normal limits bilaterally.  Nails Thick disfigured discolored nails with subungual debris  from hallux to fifth toes bilaterally. No evidence of bacterial infection or drainage bilaterally.  Orthopedic  No limitations of motion  feet .  No crepitus or effusions noted.  No bony pathology or digital deformities noted.  Skin  normotropic skin with no porokeratosis noted bilaterally.  No signs of infections or ulcers noted.     Onychomycosis  Pain in right toes  Pain in left toes  Consent was obtained for treatment procedures.   Mechanical debridement of nails 1-5  bilaterally performed with a nail nipper.  Filed with dremel without incident.    Return office visit    3-4 months                  Told patient to return for periodic foot care and evaluation due to potential at risk complications.   Gardiner Barefoot DPM

## 2020-12-31 ENCOUNTER — Telehealth: Payer: Self-pay | Admitting: *Deleted

## 2020-12-31 DIAGNOSIS — I129 Hypertensive chronic kidney disease with stage 1 through stage 4 chronic kidney disease, or unspecified chronic kidney disease: Secondary | ICD-10-CM | POA: Diagnosis not present

## 2020-12-31 DIAGNOSIS — G40909 Epilepsy, unspecified, not intractable, without status epilepticus: Secondary | ICD-10-CM | POA: Diagnosis not present

## 2020-12-31 DIAGNOSIS — G629 Polyneuropathy, unspecified: Secondary | ICD-10-CM | POA: Diagnosis not present

## 2020-12-31 DIAGNOSIS — J449 Chronic obstructive pulmonary disease, unspecified: Secondary | ICD-10-CM | POA: Diagnosis not present

## 2020-12-31 DIAGNOSIS — N1831 Chronic kidney disease, stage 3a: Secondary | ICD-10-CM | POA: Diagnosis not present

## 2020-12-31 DIAGNOSIS — F0280 Dementia in other diseases classified elsewhere without behavioral disturbance: Secondary | ICD-10-CM | POA: Diagnosis not present

## 2020-12-31 NOTE — Telephone Encounter (Signed)
Called and left a voicemail for patient's son, Eliza Grissinger., to schedule a palliative care home visit. Left contact information for return call.

## 2020-12-31 NOTE — Telephone Encounter (Signed)
Return call received from [employer]. Home visit scheduled for 4/27@1030a .

## 2021-01-02 ENCOUNTER — Other Ambulatory Visit: Payer: Self-pay

## 2021-01-02 ENCOUNTER — Other Ambulatory Visit: Payer: Medicare Other | Admitting: *Deleted

## 2021-01-02 VITALS — BP 112/63 | HR 61 | Temp 97.6°F | Resp 16

## 2021-01-02 DIAGNOSIS — R627 Adult failure to thrive: Secondary | ICD-10-CM | POA: Diagnosis not present

## 2021-01-02 DIAGNOSIS — Z515 Encounter for palliative care: Secondary | ICD-10-CM

## 2021-01-03 DIAGNOSIS — N1831 Chronic kidney disease, stage 3a: Secondary | ICD-10-CM | POA: Diagnosis not present

## 2021-01-03 DIAGNOSIS — G629 Polyneuropathy, unspecified: Secondary | ICD-10-CM | POA: Diagnosis not present

## 2021-01-03 DIAGNOSIS — G40909 Epilepsy, unspecified, not intractable, without status epilepticus: Secondary | ICD-10-CM | POA: Diagnosis not present

## 2021-01-03 DIAGNOSIS — I129 Hypertensive chronic kidney disease with stage 1 through stage 4 chronic kidney disease, or unspecified chronic kidney disease: Secondary | ICD-10-CM | POA: Diagnosis not present

## 2021-01-03 DIAGNOSIS — F0280 Dementia in other diseases classified elsewhere without behavioral disturbance: Secondary | ICD-10-CM | POA: Diagnosis not present

## 2021-01-03 DIAGNOSIS — J449 Chronic obstructive pulmonary disease, unspecified: Secondary | ICD-10-CM | POA: Diagnosis not present

## 2021-01-09 DIAGNOSIS — F0280 Dementia in other diseases classified elsewhere without behavioral disturbance: Secondary | ICD-10-CM | POA: Diagnosis not present

## 2021-01-09 DIAGNOSIS — N1831 Chronic kidney disease, stage 3a: Secondary | ICD-10-CM | POA: Diagnosis not present

## 2021-01-09 DIAGNOSIS — G40909 Epilepsy, unspecified, not intractable, without status epilepticus: Secondary | ICD-10-CM | POA: Diagnosis not present

## 2021-01-09 DIAGNOSIS — G629 Polyneuropathy, unspecified: Secondary | ICD-10-CM | POA: Diagnosis not present

## 2021-01-09 DIAGNOSIS — I129 Hypertensive chronic kidney disease with stage 1 through stage 4 chronic kidney disease, or unspecified chronic kidney disease: Secondary | ICD-10-CM | POA: Diagnosis not present

## 2021-01-09 DIAGNOSIS — J449 Chronic obstructive pulmonary disease, unspecified: Secondary | ICD-10-CM | POA: Diagnosis not present

## 2021-01-10 DIAGNOSIS — I480 Paroxysmal atrial fibrillation: Secondary | ICD-10-CM | POA: Diagnosis not present

## 2021-01-10 DIAGNOSIS — J449 Chronic obstructive pulmonary disease, unspecified: Secondary | ICD-10-CM | POA: Diagnosis not present

## 2021-01-10 DIAGNOSIS — G629 Polyneuropathy, unspecified: Secondary | ICD-10-CM | POA: Diagnosis not present

## 2021-01-10 DIAGNOSIS — E7800 Pure hypercholesterolemia, unspecified: Secondary | ICD-10-CM | POA: Diagnosis not present

## 2021-01-10 DIAGNOSIS — N400 Enlarged prostate without lower urinary tract symptoms: Secondary | ICD-10-CM | POA: Diagnosis not present

## 2021-01-10 DIAGNOSIS — F0390 Unspecified dementia without behavioral disturbance: Secondary | ICD-10-CM | POA: Diagnosis not present

## 2021-01-10 DIAGNOSIS — E785 Hyperlipidemia, unspecified: Secondary | ICD-10-CM | POA: Diagnosis not present

## 2021-01-10 DIAGNOSIS — I2510 Atherosclerotic heart disease of native coronary artery without angina pectoris: Secondary | ICD-10-CM | POA: Diagnosis not present

## 2021-01-10 DIAGNOSIS — I129 Hypertensive chronic kidney disease with stage 1 through stage 4 chronic kidney disease, or unspecified chronic kidney disease: Secondary | ICD-10-CM | POA: Diagnosis not present

## 2021-01-10 DIAGNOSIS — K219 Gastro-esophageal reflux disease without esophagitis: Secondary | ICD-10-CM | POA: Diagnosis not present

## 2021-01-10 DIAGNOSIS — N1831 Chronic kidney disease, stage 3a: Secondary | ICD-10-CM | POA: Diagnosis not present

## 2021-01-10 DIAGNOSIS — F322 Major depressive disorder, single episode, severe without psychotic features: Secondary | ICD-10-CM | POA: Diagnosis not present

## 2021-01-10 DIAGNOSIS — G459 Transient cerebral ischemic attack, unspecified: Secondary | ICD-10-CM | POA: Diagnosis not present

## 2021-01-10 DIAGNOSIS — I10 Essential (primary) hypertension: Secondary | ICD-10-CM | POA: Diagnosis not present

## 2021-01-10 DIAGNOSIS — F0280 Dementia in other diseases classified elsewhere without behavioral disturbance: Secondary | ICD-10-CM | POA: Diagnosis not present

## 2021-01-10 DIAGNOSIS — G40909 Epilepsy, unspecified, not intractable, without status epilepticus: Secondary | ICD-10-CM | POA: Diagnosis not present

## 2021-01-15 NOTE — Progress Notes (Signed)
COMMUNITY PALLIATIVE CARE RN NOTE  PATIENT NAME: Derek [name] DOB: [birth year] MRN: [id number]  PRIMARY CARE PROVIDER: Seward Carol, MD  RESPONSIBLE PARTY: Sheryn Bison (son) Acct ID - Guarantor Home Phone Work Phone Relationship Acct Type  [id number] [place] [phone]  Self P/F     [address] [place], [place] [address]   Covid-19 Pre-screening Negative  PLAN OF CARE and INTERVENTION:  1. ADVANCE CARE PLANNING/GOALS OF CARE: Goal is for patient to remain in his home. He has a DNR. 2. PATIENT/CAREGIVER EDUCATION: Disease progression, symptom management, safe mobility/transfers, s/s of infection 3. DISEASE STATUS: Face-to-face visit completed. Patient, son and hired caregiver present during visit. Son reports that patient was recently hospitalized from 3/31-4/2. Son says that patient was sitting at the dining room table and noticed patient's nose bleeding. He then slumped over and he was not responding and became very pale. He lowered him to the floor, as son usually does when patient has these spells, however patient was taking longer than usual to rebound so EMS was called. He was diagnosed with syncope and given IV fluids. An x-ray was also taken of his R knee d/t pain, and it was negative for fracture. It is slowly improving. Patient is requiring increased assistance with transfers and not ambulating as much. He is mainly being transported via wheelchair d/t weakness. He continues to require 1 person assistance with bathing, dressing, transfers, ambulation, toileting and sometimes feeding. His appetite is decreasing. He does cough some when drinking fluids. He is taking his medications whole. He is sleeping more overall. During visit, he is sitting at the dining table awake, but falling asleep on and off throughout visit. He is less alert than noted on previous visits and his speech is becoming more unintelligible. He has a congested cough, however his lungs are clear. Son  says he has started PT through [facility] for 6 weeks. Son had the caregiver get a urine sample from patient and wanted it tested with a home test strip kit for a UTI. Assisted caregiver with this home test and it came back positive for many leukocytes and according to instructions, patient may have a UTI. Son is calling PCP to see if they can bring the sample to the office. Will continue to monitor.    HISTORY OF PRESENT ILLNESS: This is a 87 yo male with a history of dementia, pAfib, CAD, moderate aortic stenosis, HTN, HLD and CKD stage 3.  Palliative care teamcontinues to follow patient and will visit patient monthly and PRN.   CODE STATUS: DNR  ADVANCED DIRECTIVES: Y MOST FORM: no PPS: 40%   PHYSICAL EXAM:   VITALS: Today's Vitals   01/02/21 1127  BP: 112/63  Pulse: 61  Resp: 16  Temp: 97.6 F (36.4 C)  TempSrc: Temporal  SpO2: 97%  PainSc: 0-No pain    LUNGS: clear to auscultation  CARDIAC: Cor RRR EXTREMITIES: No edema SKIN: Exposed skin is dry and intact  NEURO: Alert and oriented to person/place, increased generalized weakness, mainly transported via wheelchair   (Duration of visit and documentation 75 minutes)   Daryl Eastern, RN BSN

## 2021-01-16 DIAGNOSIS — I129 Hypertensive chronic kidney disease with stage 1 through stage 4 chronic kidney disease, or unspecified chronic kidney disease: Secondary | ICD-10-CM | POA: Diagnosis not present

## 2021-01-16 DIAGNOSIS — J449 Chronic obstructive pulmonary disease, unspecified: Secondary | ICD-10-CM | POA: Diagnosis not present

## 2021-01-16 DIAGNOSIS — F0280 Dementia in other diseases classified elsewhere without behavioral disturbance: Secondary | ICD-10-CM | POA: Diagnosis not present

## 2021-01-16 DIAGNOSIS — G40909 Epilepsy, unspecified, not intractable, without status epilepticus: Secondary | ICD-10-CM | POA: Diagnosis not present

## 2021-01-16 DIAGNOSIS — G629 Polyneuropathy, unspecified: Secondary | ICD-10-CM | POA: Diagnosis not present

## 2021-01-16 DIAGNOSIS — N1831 Chronic kidney disease, stage 3a: Secondary | ICD-10-CM | POA: Diagnosis not present

## 2021-01-17 DIAGNOSIS — Z9181 History of falling: Secondary | ICD-10-CM | POA: Diagnosis not present

## 2021-01-17 DIAGNOSIS — K219 Gastro-esophageal reflux disease without esophagitis: Secondary | ICD-10-CM | POA: Diagnosis not present

## 2021-01-17 DIAGNOSIS — G40909 Epilepsy, unspecified, not intractable, without status epilepticus: Secondary | ICD-10-CM | POA: Diagnosis not present

## 2021-01-17 DIAGNOSIS — G629 Polyneuropathy, unspecified: Secondary | ICD-10-CM | POA: Diagnosis not present

## 2021-01-17 DIAGNOSIS — I480 Paroxysmal atrial fibrillation: Secondary | ICD-10-CM | POA: Diagnosis not present

## 2021-01-17 DIAGNOSIS — I129 Hypertensive chronic kidney disease with stage 1 through stage 4 chronic kidney disease, or unspecified chronic kidney disease: Secondary | ICD-10-CM | POA: Diagnosis not present

## 2021-01-17 DIAGNOSIS — E785 Hyperlipidemia, unspecified: Secondary | ICD-10-CM | POA: Diagnosis not present

## 2021-01-17 DIAGNOSIS — E440 Moderate protein-calorie malnutrition: Secondary | ICD-10-CM | POA: Diagnosis not present

## 2021-01-17 DIAGNOSIS — G4733 Obstructive sleep apnea (adult) (pediatric): Secondary | ICD-10-CM | POA: Diagnosis not present

## 2021-01-17 DIAGNOSIS — N400 Enlarged prostate without lower urinary tract symptoms: Secondary | ICD-10-CM | POA: Diagnosis not present

## 2021-01-17 DIAGNOSIS — Z8673 Personal history of transient ischemic attack (TIA), and cerebral infarction without residual deficits: Secondary | ICD-10-CM | POA: Diagnosis not present

## 2021-01-17 DIAGNOSIS — I252 Old myocardial infarction: Secondary | ICD-10-CM | POA: Diagnosis not present

## 2021-01-17 DIAGNOSIS — I700 Atherosclerosis of aorta: Secondary | ICD-10-CM | POA: Diagnosis not present

## 2021-01-17 DIAGNOSIS — F32A Depression, unspecified: Secondary | ICD-10-CM | POA: Diagnosis not present

## 2021-01-17 DIAGNOSIS — R1310 Dysphagia, unspecified: Secondary | ICD-10-CM | POA: Diagnosis not present

## 2021-01-17 DIAGNOSIS — I2510 Atherosclerotic heart disease of native coronary artery without angina pectoris: Secondary | ICD-10-CM | POA: Diagnosis not present

## 2021-01-17 DIAGNOSIS — I350 Nonrheumatic aortic (valve) stenosis: Secondary | ICD-10-CM | POA: Diagnosis not present

## 2021-01-17 DIAGNOSIS — Z8701 Personal history of pneumonia (recurrent): Secondary | ICD-10-CM | POA: Diagnosis not present

## 2021-01-17 DIAGNOSIS — J449 Chronic obstructive pulmonary disease, unspecified: Secondary | ICD-10-CM | POA: Diagnosis not present

## 2021-01-17 DIAGNOSIS — N1831 Chronic kidney disease, stage 3a: Secondary | ICD-10-CM | POA: Diagnosis not present

## 2021-01-17 DIAGNOSIS — Z7901 Long term (current) use of anticoagulants: Secondary | ICD-10-CM | POA: Diagnosis not present

## 2021-01-17 DIAGNOSIS — F0280 Dementia in other diseases classified elsewhere without behavioral disturbance: Secondary | ICD-10-CM | POA: Diagnosis not present

## 2021-01-21 DIAGNOSIS — G629 Polyneuropathy, unspecified: Secondary | ICD-10-CM | POA: Diagnosis not present

## 2021-01-21 DIAGNOSIS — I129 Hypertensive chronic kidney disease with stage 1 through stage 4 chronic kidney disease, or unspecified chronic kidney disease: Secondary | ICD-10-CM | POA: Diagnosis not present

## 2021-01-21 DIAGNOSIS — G40909 Epilepsy, unspecified, not intractable, without status epilepticus: Secondary | ICD-10-CM | POA: Diagnosis not present

## 2021-01-21 DIAGNOSIS — N1831 Chronic kidney disease, stage 3a: Secondary | ICD-10-CM | POA: Diagnosis not present

## 2021-01-21 DIAGNOSIS — J449 Chronic obstructive pulmonary disease, unspecified: Secondary | ICD-10-CM | POA: Diagnosis not present

## 2021-01-21 DIAGNOSIS — F0280 Dementia in other diseases classified elsewhere without behavioral disturbance: Secondary | ICD-10-CM | POA: Diagnosis not present

## 2021-01-23 DIAGNOSIS — G40909 Epilepsy, unspecified, not intractable, without status epilepticus: Secondary | ICD-10-CM | POA: Diagnosis not present

## 2021-01-23 DIAGNOSIS — G629 Polyneuropathy, unspecified: Secondary | ICD-10-CM | POA: Diagnosis not present

## 2021-01-23 DIAGNOSIS — I129 Hypertensive chronic kidney disease with stage 1 through stage 4 chronic kidney disease, or unspecified chronic kidney disease: Secondary | ICD-10-CM | POA: Diagnosis not present

## 2021-01-23 DIAGNOSIS — N1831 Chronic kidney disease, stage 3a: Secondary | ICD-10-CM | POA: Diagnosis not present

## 2021-01-23 DIAGNOSIS — J449 Chronic obstructive pulmonary disease, unspecified: Secondary | ICD-10-CM | POA: Diagnosis not present

## 2021-01-23 DIAGNOSIS — F0280 Dementia in other diseases classified elsewhere without behavioral disturbance: Secondary | ICD-10-CM | POA: Diagnosis not present

## 2021-01-30 DIAGNOSIS — J449 Chronic obstructive pulmonary disease, unspecified: Secondary | ICD-10-CM | POA: Diagnosis not present

## 2021-01-30 DIAGNOSIS — I129 Hypertensive chronic kidney disease with stage 1 through stage 4 chronic kidney disease, or unspecified chronic kidney disease: Secondary | ICD-10-CM | POA: Diagnosis not present

## 2021-01-30 DIAGNOSIS — N1831 Chronic kidney disease, stage 3a: Secondary | ICD-10-CM | POA: Diagnosis not present

## 2021-01-30 DIAGNOSIS — F0280 Dementia in other diseases classified elsewhere without behavioral disturbance: Secondary | ICD-10-CM | POA: Diagnosis not present

## 2021-01-30 DIAGNOSIS — G40909 Epilepsy, unspecified, not intractable, without status epilepticus: Secondary | ICD-10-CM | POA: Diagnosis not present

## 2021-01-30 DIAGNOSIS — G629 Polyneuropathy, unspecified: Secondary | ICD-10-CM | POA: Diagnosis not present

## 2021-02-01 ENCOUNTER — Other Ambulatory Visit: Payer: Medicare Other

## 2021-02-01 DIAGNOSIS — Z515 Encounter for palliative care: Secondary | ICD-10-CM

## 2021-02-06 ENCOUNTER — Other Ambulatory Visit: Payer: Self-pay

## 2021-02-06 NOTE — Progress Notes (Signed)
COMMUNITY PALLIATIVE CARE SW NOTE  PATIENT NAME: Derek [name] DOB: November 23, 1926 MRN: [id number]  PRIMARY CARE PROVIDER: Seward Carol, MD  RESPONSIBLE PARTY:  Acct ID - Guarantor Home Phone Work Phone Relationship Acct Type  [id number] [place] [phone]  Self P/F     [place], [place],  [address]   Due to the COVID-19 crisis, this virtual check-in visit was done via telephone from my office and it was initiated and consent by this patient and or family    PLAN OF CARE and INTERVENTIONS:             1. GOALS OF CARE/ ADVANCE CARE PLANNING: Goal is for patient to remain home. Patient is a DNR. 2.  SOCIAL/EMOTIONAL/SPIRITUAL ASSESSMENT/ INTERVENTIONS:   3.  SW completed a telephone check-in with patient's son-Derek [name], who provided a status update on patient. Derek [name] advised that patient's condition has not changed, but he seems to be declining. Derek [name] report that patient's blood pressure has been low, sometimes dropping 23/30 points. He planning to contact patient's cardiologist and try to get patient an appointment or some direction as to what to do next for him. Patient's appetite is fluctuating, where is intake changes from meal to meal, day to day. Patient continues to sleep most of the day and is difficult to wake up at times. Patient has not had any falls. Patient is dependent for personal care and ADL's needs. Patient was is a two person assist, but mostly ambulated in his wheelchair, despite being able to walk with his walker. SW encouraged Derek [name] to contact patient's cardiologist to discuss concerns. SW advised that she will update palliative care RN for follow-up. SW provided assessment of needs and comfort of patient; coping and needs of PCG. SW reinforced access to palliative care encounter. 4. PATIENT/CAREGIVER EDUCATION/ COPING:  Patient and PCG appear to be coping. 5. PERSONAL EMERGENCY PLAN:  911 can be activated for emergencies.  6. COMMUNITY RESOURCES  COORDINATION/ HEALTH CARE NAVIGATION: Patient receives private caregiver support through [employer]. 7. FINANCIAL/LEGAL CONCERNS/INTERVENTIONS:  None.     SOCIAL HX:  Social History   Tobacco Use  . Smoking status: Never Smoker  . Smokeless tobacco: Never Used  Substance Use Topics  . Alcohol use: Yes    Alcohol/week: 1.0 standard drink    Types: 1 Glasses of wine per week    Comment: occasionally    CODE STATUS: DNR ADVANCED DIRECTIVES: Yes MOST FORM COMPLETE:  No HOSPICE EDUCATION PROVIDED: No  PPS: Patient is alert and oriented to self. He continues to decline cognitively.  He is sleeping most of the day and deeper.  Patient is incontinent and dependent for personal care needs. Patient requires cueing and encouragement to eat, however his appetite has decreased significantly.  Duration of telephonic visit and documentation: 30 minutes       Katheren Puller, LCSW

## 2021-02-07 DIAGNOSIS — G40909 Epilepsy, unspecified, not intractable, without status epilepticus: Secondary | ICD-10-CM | POA: Diagnosis not present

## 2021-02-07 DIAGNOSIS — I129 Hypertensive chronic kidney disease with stage 1 through stage 4 chronic kidney disease, or unspecified chronic kidney disease: Secondary | ICD-10-CM | POA: Diagnosis not present

## 2021-02-07 DIAGNOSIS — G629 Polyneuropathy, unspecified: Secondary | ICD-10-CM | POA: Diagnosis not present

## 2021-02-07 DIAGNOSIS — J449 Chronic obstructive pulmonary disease, unspecified: Secondary | ICD-10-CM | POA: Diagnosis not present

## 2021-02-07 DIAGNOSIS — N1831 Chronic kidney disease, stage 3a: Secondary | ICD-10-CM | POA: Diagnosis not present

## 2021-02-07 DIAGNOSIS — F0280 Dementia in other diseases classified elsewhere without behavioral disturbance: Secondary | ICD-10-CM | POA: Diagnosis not present

## 2021-02-12 ENCOUNTER — Other Ambulatory Visit: Payer: Self-pay

## 2021-02-12 ENCOUNTER — Other Ambulatory Visit: Payer: Medicare Other | Admitting: *Deleted

## 2021-02-12 ENCOUNTER — Telehealth: Payer: Self-pay | Admitting: Interventional Cardiology

## 2021-02-12 VITALS — BP 102/61 | HR 62 | Temp 97.5°F | Resp 16

## 2021-02-12 DIAGNOSIS — Z515 Encounter for palliative care: Secondary | ICD-10-CM

## 2021-02-12 NOTE — Telephone Encounter (Signed)
Pt c/o BP issue: STAT if pt c/o blurred vision, one-sided weakness or slurred speech  1. What are your last 5 BP readings? 102/65 HR62  2. Are you having any other symptoms (ex. Dizziness, headache, blurred vision, passed out)?    3. What is your BP issue? Son feels like Amlodipine should be decreased to 2.5mg  for 30 days   Son would like for his fathers meds to be evaluated. Please advise

## 2021-02-12 NOTE — Progress Notes (Signed)
COMMUNITY PALLIATIVE CARE RN NOTE  PATIENT NAME: Derek [name] DOB: 1927-08-26 MRN: [id number]  PRIMARY CARE PROVIDER: Seward Carol, MD  RESPONSIBLE PARTY: Sheryn Bison (son) Acct ID - Guarantor Home Phone Work Phone Relationship Acct Type  [id number] [place] [phone]  Self P/F     [address], [place], [place] [address]   Covid-19 Pre-screening Negative  PLAN OF CARE and INTERVENTION:  1. ADVANCE CARE PLANNING/GOALS OF CARE: Goal is for patient to remain at home with his son and maintain current level of functioning. He has a DNR. 2. PATIENT/CAREGIVER EDUCATION: Symptom management, safe mobility/transfers, fall prevention, s/s of infection 3. DISEASE STATUS: Face-to-face visit completed. Met with patient and his son in their home. Hired caregiver also present. Upon arrival, patient is sitting up at the dining table asleep. He is easily aroused with verbal and tactile stimulation. He is able to answer simple questions with short replies. He is forgetful with intermittent confusion. He denies pain. Respirations even, regular and unlabored. He does have an intermittent non-productive cough. Some gurgling noted in patient's throat when he speaks. He is becoming progressively weaker. He is no longer ambulatory. He is transferred to wheelchair with 1 person assistance. He is able to bear some weight, long enough to transfer or have his brief changed. He is transported via wheelchair. He requires extensive assistance with bathing, dressing and toileting. He is able to feed himself independently, but it takes him about an hour to do so. Son wants patient to do whatever he can for as long as he can. His intake is fair. He continues to cough some when drinking thin liquids. He continues to take his medications whole. He had 2 falls over the weekend. During one fall, he sustained a skin tear to his left hand. Some surrounding bruising noted and area is covered with a bandaid. He  continues to receive PT through [facility]. Son is going to purchase a bed/chair alarm. His son is concerned regarding patient's blood pressure. It is steadily decreasing. 2 months ago it was 112/63. Today it is 102/61. Patient is sleeping throughout most of the day and sleeps throughout the night. Son says he seems "wiped out" all the time and wonders if his blood pressure readings are contributing to this. Son is going to reach out to patient's Cardiologist to discuss if medication changes are warranted due to lower blood pressures. No edema noted today in bilateral lower extremities today.Will continue to monitor.   HISTORY OF PRESENT ILLNESS: This is a 87 yo male with a history of dementia, pAfib, CAD, moderate aortic stenosis, HTN, HLD and CKD stage 3. Palliative care teamcontinues to follow patient and will visit patient monthly and PRN.    CODE STATUS: DNR ADVANCED DIRECTIVES: Y MOST FORM: no PPS: 30%   PHYSICAL EXAM:   VITALS: Today's Vitals   02/12/21 1103  BP: 102/61  Pulse: 62  Resp: 16  Temp: (!) 97.5 F (36.4 C)  TempSrc: Temporal  SpO2: 96%  PainSc: 0-No pain    LUNGS: clear to auscultation  CARDIAC: Cor RRR  EXTREMITIES: No edema SKIN: Skin tear to L hand with some surrounding bruising (covered with bandaid)  NEURO: Alert and oriented to self, intermittent confusion, forgetful, increased generalized weakness, transported via wheelchair    (Duration of visit and documentation 60 minutes)   Daryl Eastern, RN BSN

## 2021-02-12 NOTE — Telephone Encounter (Signed)
Spoke with the pts son and he reports that the pts BP has been consistently running low and the pt has been very tired and low energy... BP 102/65 HR 62.   He is suggesting to cut back on his meds such as his Amlodipine 5 mg to maybe 2.5 and will continue to monitor.   He is also taking Metoprolol 25 mg bid but his son cut it back a few weeks ago to 25mg  in the morning and 12.5 in the evening. (med list changed)  He has been eating and drinking well as he has been.   Will forward to Dr. Tamala Julian for review.

## 2021-02-13 DIAGNOSIS — I129 Hypertensive chronic kidney disease with stage 1 through stage 4 chronic kidney disease, or unspecified chronic kidney disease: Secondary | ICD-10-CM | POA: Diagnosis not present

## 2021-02-13 DIAGNOSIS — G629 Polyneuropathy, unspecified: Secondary | ICD-10-CM | POA: Diagnosis not present

## 2021-02-13 DIAGNOSIS — F0280 Dementia in other diseases classified elsewhere without behavioral disturbance: Secondary | ICD-10-CM | POA: Diagnosis not present

## 2021-02-13 DIAGNOSIS — J449 Chronic obstructive pulmonary disease, unspecified: Secondary | ICD-10-CM | POA: Diagnosis not present

## 2021-02-13 DIAGNOSIS — G40909 Epilepsy, unspecified, not intractable, without status epilepticus: Secondary | ICD-10-CM | POA: Diagnosis not present

## 2021-02-13 DIAGNOSIS — N1831 Chronic kidney disease, stage 3a: Secondary | ICD-10-CM | POA: Diagnosis not present

## 2021-02-15 MED ORDER — AMLODIPINE BESYLATE 2.5 MG PO TABS: 2.5000 mg | ORAL_TABLET | Freq: Every day | ORAL | 3 refills | Status: AC

## 2021-02-15 NOTE — Telephone Encounter (Signed)
Spoke with son and made him aware.  Sent in prescription for Amlodipine 2.5mg  QD.  Son appreciative for call.

## 2021-02-15 NOTE — Telephone Encounter (Signed)
Okay to decrease medications as requested as long as blood pressure recordings are accurate.

## 2021-02-16 DIAGNOSIS — N1831 Chronic kidney disease, stage 3a: Secondary | ICD-10-CM | POA: Diagnosis not present

## 2021-02-16 DIAGNOSIS — R627 Adult failure to thrive: Secondary | ICD-10-CM | POA: Diagnosis not present

## 2021-02-16 DIAGNOSIS — Z8673 Personal history of transient ischemic attack (TIA), and cerebral infarction without residual deficits: Secondary | ICD-10-CM | POA: Diagnosis not present

## 2021-02-16 DIAGNOSIS — N400 Enlarged prostate without lower urinary tract symptoms: Secondary | ICD-10-CM | POA: Diagnosis not present

## 2021-02-16 DIAGNOSIS — Z8781 Personal history of (healed) traumatic fracture: Secondary | ICD-10-CM | POA: Diagnosis not present

## 2021-02-16 DIAGNOSIS — G4733 Obstructive sleep apnea (adult) (pediatric): Secondary | ICD-10-CM | POA: Diagnosis not present

## 2021-02-16 DIAGNOSIS — Z7901 Long term (current) use of anticoagulants: Secondary | ICD-10-CM | POA: Diagnosis not present

## 2021-02-16 DIAGNOSIS — K219 Gastro-esophageal reflux disease without esophagitis: Secondary | ICD-10-CM | POA: Diagnosis not present

## 2021-02-16 DIAGNOSIS — I480 Paroxysmal atrial fibrillation: Secondary | ICD-10-CM | POA: Diagnosis not present

## 2021-02-16 DIAGNOSIS — I951 Orthostatic hypotension: Secondary | ICD-10-CM | POA: Diagnosis not present

## 2021-02-16 DIAGNOSIS — I350 Nonrheumatic aortic (valve) stenosis: Secondary | ICD-10-CM | POA: Diagnosis not present

## 2021-02-16 DIAGNOSIS — I700 Atherosclerosis of aorta: Secondary | ICD-10-CM | POA: Diagnosis not present

## 2021-02-16 DIAGNOSIS — I129 Hypertensive chronic kidney disease with stage 1 through stage 4 chronic kidney disease, or unspecified chronic kidney disease: Secondary | ICD-10-CM | POA: Diagnosis not present

## 2021-02-16 DIAGNOSIS — G629 Polyneuropathy, unspecified: Secondary | ICD-10-CM | POA: Diagnosis not present

## 2021-02-16 DIAGNOSIS — E785 Hyperlipidemia, unspecified: Secondary | ICD-10-CM | POA: Diagnosis not present

## 2021-02-16 DIAGNOSIS — F0280 Dementia in other diseases classified elsewhere without behavioral disturbance: Secondary | ICD-10-CM | POA: Diagnosis not present

## 2021-02-16 DIAGNOSIS — Z8701 Personal history of pneumonia (recurrent): Secondary | ICD-10-CM | POA: Diagnosis not present

## 2021-02-16 DIAGNOSIS — E440 Moderate protein-calorie malnutrition: Secondary | ICD-10-CM | POA: Diagnosis not present

## 2021-02-16 DIAGNOSIS — J449 Chronic obstructive pulmonary disease, unspecified: Secondary | ICD-10-CM | POA: Diagnosis not present

## 2021-02-16 DIAGNOSIS — I2510 Atherosclerotic heart disease of native coronary artery without angina pectoris: Secondary | ICD-10-CM | POA: Diagnosis not present

## 2021-02-16 DIAGNOSIS — G40909 Epilepsy, unspecified, not intractable, without status epilepticus: Secondary | ICD-10-CM | POA: Diagnosis not present

## 2021-02-16 DIAGNOSIS — R1310 Dysphagia, unspecified: Secondary | ICD-10-CM | POA: Diagnosis not present

## 2021-02-16 DIAGNOSIS — I252 Old myocardial infarction: Secondary | ICD-10-CM | POA: Diagnosis not present

## 2021-02-16 DIAGNOSIS — Z9841 Cataract extraction status, right eye: Secondary | ICD-10-CM | POA: Diagnosis not present

## 2021-02-16 DIAGNOSIS — F322 Major depressive disorder, single episode, severe without psychotic features: Secondary | ICD-10-CM | POA: Diagnosis not present

## 2021-02-23 ENCOUNTER — Other Ambulatory Visit: Payer: Self-pay | Admitting: Interventional Cardiology

## 2021-02-23 ENCOUNTER — Other Ambulatory Visit: Payer: Self-pay | Admitting: Neurology

## 2021-02-25 ENCOUNTER — Other Ambulatory Visit: Payer: Self-pay | Admitting: Neurology

## 2021-02-26 ENCOUNTER — Other Ambulatory Visit: Payer: Self-pay | Admitting: *Deleted

## 2021-02-26 DIAGNOSIS — F0280 Dementia in other diseases classified elsewhere without behavioral disturbance: Secondary | ICD-10-CM | POA: Diagnosis not present

## 2021-02-26 DIAGNOSIS — I951 Orthostatic hypotension: Secondary | ICD-10-CM | POA: Diagnosis not present

## 2021-02-26 DIAGNOSIS — N1831 Chronic kidney disease, stage 3a: Secondary | ICD-10-CM | POA: Diagnosis not present

## 2021-02-26 DIAGNOSIS — I480 Paroxysmal atrial fibrillation: Secondary | ICD-10-CM | POA: Diagnosis not present

## 2021-02-26 DIAGNOSIS — G40909 Epilepsy, unspecified, not intractable, without status epilepticus: Secondary | ICD-10-CM | POA: Diagnosis not present

## 2021-02-26 DIAGNOSIS — I129 Hypertensive chronic kidney disease with stage 1 through stage 4 chronic kidney disease, or unspecified chronic kidney disease: Secondary | ICD-10-CM | POA: Diagnosis not present

## 2021-02-26 MED ORDER — DONEPEZIL HCL 10 MG PO TABS: 5.0000 mg | ORAL_TABLET | Freq: Every day | ORAL | 3 refills | Status: AC

## 2021-03-07 DIAGNOSIS — E785 Hyperlipidemia, unspecified: Secondary | ICD-10-CM | POA: Diagnosis not present

## 2021-03-07 DIAGNOSIS — I480 Paroxysmal atrial fibrillation: Secondary | ICD-10-CM | POA: Diagnosis not present

## 2021-03-07 DIAGNOSIS — I2510 Atherosclerotic heart disease of native coronary artery without angina pectoris: Secondary | ICD-10-CM | POA: Diagnosis not present

## 2021-03-07 DIAGNOSIS — K219 Gastro-esophageal reflux disease without esophagitis: Secondary | ICD-10-CM | POA: Diagnosis not present

## 2021-03-07 DIAGNOSIS — F0390 Unspecified dementia without behavioral disturbance: Secondary | ICD-10-CM | POA: Diagnosis not present

## 2021-03-07 DIAGNOSIS — I639 Cerebral infarction, unspecified: Secondary | ICD-10-CM | POA: Diagnosis not present

## 2021-03-07 DIAGNOSIS — I10 Essential (primary) hypertension: Secondary | ICD-10-CM | POA: Diagnosis not present

## 2021-03-07 DIAGNOSIS — N183 Chronic kidney disease, stage 3 unspecified: Secondary | ICD-10-CM | POA: Diagnosis not present

## 2021-03-07 DIAGNOSIS — F322 Major depressive disorder, single episode, severe without psychotic features: Secondary | ICD-10-CM | POA: Diagnosis not present

## 2021-03-07 DIAGNOSIS — G459 Transient cerebral ischemic attack, unspecified: Secondary | ICD-10-CM | POA: Diagnosis not present

## 2021-03-07 DIAGNOSIS — E7800 Pure hypercholesterolemia, unspecified: Secondary | ICD-10-CM | POA: Diagnosis not present

## 2021-03-07 DIAGNOSIS — N400 Enlarged prostate without lower urinary tract symptoms: Secondary | ICD-10-CM | POA: Diagnosis not present

## 2021-03-14 DIAGNOSIS — N1831 Chronic kidney disease, stage 3a: Secondary | ICD-10-CM | POA: Diagnosis not present

## 2021-03-14 DIAGNOSIS — I951 Orthostatic hypotension: Secondary | ICD-10-CM | POA: Diagnosis not present

## 2021-03-14 DIAGNOSIS — I480 Paroxysmal atrial fibrillation: Secondary | ICD-10-CM | POA: Diagnosis not present

## 2021-03-14 DIAGNOSIS — I129 Hypertensive chronic kidney disease with stage 1 through stage 4 chronic kidney disease, or unspecified chronic kidney disease: Secondary | ICD-10-CM | POA: Diagnosis not present

## 2021-03-14 DIAGNOSIS — F0280 Dementia in other diseases classified elsewhere without behavioral disturbance: Secondary | ICD-10-CM | POA: Diagnosis not present

## 2021-03-14 DIAGNOSIS — G40909 Epilepsy, unspecified, not intractable, without status epilepticus: Secondary | ICD-10-CM | POA: Diagnosis not present

## 2021-03-18 ENCOUNTER — Telehealth: Payer: Self-pay | Admitting: Neurology

## 2021-03-18 DIAGNOSIS — N400 Enlarged prostate without lower urinary tract symptoms: Secondary | ICD-10-CM | POA: Diagnosis not present

## 2021-03-18 DIAGNOSIS — G4733 Obstructive sleep apnea (adult) (pediatric): Secondary | ICD-10-CM | POA: Diagnosis not present

## 2021-03-18 DIAGNOSIS — Z8673 Personal history of transient ischemic attack (TIA), and cerebral infarction without residual deficits: Secondary | ICD-10-CM | POA: Diagnosis not present

## 2021-03-18 DIAGNOSIS — E440 Moderate protein-calorie malnutrition: Secondary | ICD-10-CM | POA: Diagnosis not present

## 2021-03-18 DIAGNOSIS — I350 Nonrheumatic aortic (valve) stenosis: Secondary | ICD-10-CM | POA: Diagnosis not present

## 2021-03-18 DIAGNOSIS — N1831 Chronic kidney disease, stage 3a: Secondary | ICD-10-CM | POA: Diagnosis not present

## 2021-03-18 DIAGNOSIS — Z7901 Long term (current) use of anticoagulants: Secondary | ICD-10-CM | POA: Diagnosis not present

## 2021-03-18 DIAGNOSIS — I700 Atherosclerosis of aorta: Secondary | ICD-10-CM | POA: Diagnosis not present

## 2021-03-18 DIAGNOSIS — G40909 Epilepsy, unspecified, not intractable, without status epilepticus: Secondary | ICD-10-CM | POA: Diagnosis not present

## 2021-03-18 DIAGNOSIS — I2510 Atherosclerotic heart disease of native coronary artery without angina pectoris: Secondary | ICD-10-CM | POA: Diagnosis not present

## 2021-03-18 DIAGNOSIS — R627 Adult failure to thrive: Secondary | ICD-10-CM | POA: Diagnosis not present

## 2021-03-18 DIAGNOSIS — I129 Hypertensive chronic kidney disease with stage 1 through stage 4 chronic kidney disease, or unspecified chronic kidney disease: Secondary | ICD-10-CM | POA: Diagnosis not present

## 2021-03-18 DIAGNOSIS — Z8701 Personal history of pneumonia (recurrent): Secondary | ICD-10-CM | POA: Diagnosis not present

## 2021-03-18 DIAGNOSIS — E785 Hyperlipidemia, unspecified: Secondary | ICD-10-CM | POA: Diagnosis not present

## 2021-03-18 DIAGNOSIS — F0280 Dementia in other diseases classified elsewhere without behavioral disturbance: Secondary | ICD-10-CM | POA: Diagnosis not present

## 2021-03-18 DIAGNOSIS — J449 Chronic obstructive pulmonary disease, unspecified: Secondary | ICD-10-CM | POA: Diagnosis not present

## 2021-03-18 DIAGNOSIS — Z9841 Cataract extraction status, right eye: Secondary | ICD-10-CM | POA: Diagnosis not present

## 2021-03-18 DIAGNOSIS — I480 Paroxysmal atrial fibrillation: Secondary | ICD-10-CM | POA: Diagnosis not present

## 2021-03-18 DIAGNOSIS — Z8781 Personal history of (healed) traumatic fracture: Secondary | ICD-10-CM | POA: Diagnosis not present

## 2021-03-18 DIAGNOSIS — F322 Major depressive disorder, single episode, severe without psychotic features: Secondary | ICD-10-CM | POA: Diagnosis not present

## 2021-03-18 DIAGNOSIS — R1310 Dysphagia, unspecified: Secondary | ICD-10-CM | POA: Diagnosis not present

## 2021-03-18 DIAGNOSIS — K219 Gastro-esophageal reflux disease without esophagitis: Secondary | ICD-10-CM | POA: Diagnosis not present

## 2021-03-18 DIAGNOSIS — I951 Orthostatic hypotension: Secondary | ICD-10-CM | POA: Diagnosis not present

## 2021-03-18 DIAGNOSIS — I252 Old myocardial infarction: Secondary | ICD-10-CM | POA: Diagnosis not present

## 2021-03-18 DIAGNOSIS — G629 Polyneuropathy, unspecified: Secondary | ICD-10-CM | POA: Diagnosis not present

## 2021-03-18 NOTE — Telephone Encounter (Signed)
Pt's daughter, Stokely Jeancharles (on [place]) called, need a letter stating he is incompetent to handling financial responsibilities. Would like a call from the nurse.

## 2021-03-18 NOTE — Telephone Encounter (Signed)
I returned pt's daughter's call. She sts she is in need of letter for [facility] to deal in business with her on behalf of her father.  Per last office note from Dr. Jannifer Franklin on 11/08/20 The patient is currently getting 24/7 assistance at home and last MMSE on 12/27/20 reflects score of 17/30.  I advised [name] Dr. Jannifer Franklin is out of the office this week, but I would formulate letter for him to review once he returns. Daughter states she is out of town this week as well and would like the letter mailed to the pt's home address once Dr. Jannifer Franklin has signed letter next week.  I advised pt we would complete as requested and have Dr. Jannifer Franklin sign off if he agrees on Monday. Daughter was agreeable to this plan.

## 2021-03-21 ENCOUNTER — Encounter: Payer: Self-pay | Admitting: Podiatry

## 2021-03-25 NOTE — Telephone Encounter (Signed)
Dr. Jannifer Franklin has signed letter formulated from last week. I have placed in the mail to be delivered to the pt's home address.

## 2021-03-26 DIAGNOSIS — I480 Paroxysmal atrial fibrillation: Secondary | ICD-10-CM | POA: Diagnosis not present

## 2021-03-26 DIAGNOSIS — N1831 Chronic kidney disease, stage 3a: Secondary | ICD-10-CM | POA: Diagnosis not present

## 2021-03-26 DIAGNOSIS — F0280 Dementia in other diseases classified elsewhere without behavioral disturbance: Secondary | ICD-10-CM | POA: Diagnosis not present

## 2021-03-26 DIAGNOSIS — I129 Hypertensive chronic kidney disease with stage 1 through stage 4 chronic kidney disease, or unspecified chronic kidney disease: Secondary | ICD-10-CM | POA: Diagnosis not present

## 2021-03-26 DIAGNOSIS — I951 Orthostatic hypotension: Secondary | ICD-10-CM | POA: Diagnosis not present

## 2021-03-26 DIAGNOSIS — G40909 Epilepsy, unspecified, not intractable, without status epilepticus: Secondary | ICD-10-CM | POA: Diagnosis not present

## 2021-04-03 DIAGNOSIS — G40909 Epilepsy, unspecified, not intractable, without status epilepticus: Secondary | ICD-10-CM | POA: Diagnosis not present

## 2021-04-03 DIAGNOSIS — I951 Orthostatic hypotension: Secondary | ICD-10-CM | POA: Diagnosis not present

## 2021-04-03 DIAGNOSIS — I129 Hypertensive chronic kidney disease with stage 1 through stage 4 chronic kidney disease, or unspecified chronic kidney disease: Secondary | ICD-10-CM | POA: Diagnosis not present

## 2021-04-03 DIAGNOSIS — I480 Paroxysmal atrial fibrillation: Secondary | ICD-10-CM | POA: Diagnosis not present

## 2021-04-03 DIAGNOSIS — N1831 Chronic kidney disease, stage 3a: Secondary | ICD-10-CM | POA: Diagnosis not present

## 2021-04-03 DIAGNOSIS — F0280 Dementia in other diseases classified elsewhere without behavioral disturbance: Secondary | ICD-10-CM | POA: Diagnosis not present

## 2021-04-05 ENCOUNTER — Other Ambulatory Visit: Payer: Medicare Other | Admitting: *Deleted

## 2021-04-05 ENCOUNTER — Other Ambulatory Visit: Payer: Self-pay

## 2021-04-05 DIAGNOSIS — Z515 Encounter for palliative care: Secondary | ICD-10-CM

## 2021-04-05 NOTE — Progress Notes (Signed)
COMMUNITY PALLIATIVE CARE RN NOTE  PATIENT NAME: Derek [name] DOB: 18-Mar-1927 MRN: [id number]  PRIMARY CARE PROVIDER: Seward Carol, MD  RESPONSIBLE PARTY: Sheryn Bison (son) Acct ID - Guarantor Home Phone Work Phone Relationship Acct Type  [id number] [place] [phone]  Self P/F     [address], [place], [place] [address]   Due to the COVID-19 crisis, this virtual check-in visit was done via telephone from my office and it was initiated and consent by this patient and or family.  PLAN OF CARE and INTERVENTION:  ADVANCE CARE PLANNING/GOALS OF CARE; Goal is for patient to remain in his home and avoid going to the hospital. He has a DNR. PATIENT/CAREGIVER EDUCATION: Symptom management, safe transfers, s/s of infection DISEASE STATUS: Virtual check-in visit completed via telephone. Patient denies pain at this time. Son states that overall, patient is continually slowing down. He is progressively weaker and no longer attempting to try and get up out of his recliner. It is becoming more difficult to transfer patient to his wheelchair. He is no longer ambulatory and transported via wheelchair. No recent falls. Patient's blood pressure has decreased over the past several months so his Amlodipine was recently decreased from 5 mg daily to 2.5 mg and son is only giving 12.5 mg of Metoprolol in the am and his usual 25 mg in the evening. This has helped patient feel better and his blood pressures have been within a more normal range. The edema in his legs have improved since they are mainly elevated throughout the day/night. No edema today. He spends about 50% of time in his recliner and the other 50% in his hospital bed. His appetite is variable, but not as good as it used to be. Son feels his intake is adequate. He falls asleep after every meal. He requires assistance with all ADLs, except he is able to feed himself most of the time. He continues with hired caregivers through [employer]. He feels patient is comfortable and his condition fairly stable. Will continue to monitor.   HISTORY OF PRESENT ILLNESS:  This is a 87 yo male with a history of dementia, pAfib, CAD, moderate aortic stenosis, HTN, HLD and CKD stage 3.  Palliative care team continues to follow patient for symptom management and complex decision making.   CODE STATUS: DNR ADVANCED DIRECTIVES: Y MOST FORM: no PPS: 30%   (Duration of visit and documentation 30 minutes)   Daryl Eastern, RN BSN

## 2021-04-12 DIAGNOSIS — I129 Hypertensive chronic kidney disease with stage 1 through stage 4 chronic kidney disease, or unspecified chronic kidney disease: Secondary | ICD-10-CM | POA: Diagnosis not present

## 2021-04-12 DIAGNOSIS — N1831 Chronic kidney disease, stage 3a: Secondary | ICD-10-CM | POA: Diagnosis not present

## 2021-04-12 DIAGNOSIS — F0280 Dementia in other diseases classified elsewhere without behavioral disturbance: Secondary | ICD-10-CM | POA: Diagnosis not present

## 2021-04-12 DIAGNOSIS — G40909 Epilepsy, unspecified, not intractable, without status epilepticus: Secondary | ICD-10-CM | POA: Diagnosis not present

## 2021-04-12 DIAGNOSIS — I951 Orthostatic hypotension: Secondary | ICD-10-CM | POA: Diagnosis not present

## 2021-04-12 DIAGNOSIS — I480 Paroxysmal atrial fibrillation: Secondary | ICD-10-CM | POA: Diagnosis not present

## 2021-04-17 ENCOUNTER — Other Ambulatory Visit: Payer: Self-pay

## 2021-04-17 ENCOUNTER — Other Ambulatory Visit: Payer: Medicare Other | Admitting: *Deleted

## 2021-04-17 VITALS — BP 152/92 | HR 61 | Temp 97.6°F | Resp 16

## 2021-04-17 DIAGNOSIS — I700 Atherosclerosis of aorta: Secondary | ICD-10-CM | POA: Diagnosis not present

## 2021-04-17 DIAGNOSIS — F0280 Dementia in other diseases classified elsewhere without behavioral disturbance: Secondary | ICD-10-CM | POA: Diagnosis not present

## 2021-04-17 DIAGNOSIS — Z8701 Personal history of pneumonia (recurrent): Secondary | ICD-10-CM | POA: Diagnosis not present

## 2021-04-17 DIAGNOSIS — N400 Enlarged prostate without lower urinary tract symptoms: Secondary | ICD-10-CM | POA: Diagnosis not present

## 2021-04-17 DIAGNOSIS — I480 Paroxysmal atrial fibrillation: Secondary | ICD-10-CM | POA: Diagnosis not present

## 2021-04-17 DIAGNOSIS — Z515 Encounter for palliative care: Secondary | ICD-10-CM

## 2021-04-17 DIAGNOSIS — I129 Hypertensive chronic kidney disease with stage 1 through stage 4 chronic kidney disease, or unspecified chronic kidney disease: Secondary | ICD-10-CM | POA: Diagnosis not present

## 2021-04-17 DIAGNOSIS — K219 Gastro-esophageal reflux disease without esophagitis: Secondary | ICD-10-CM | POA: Diagnosis not present

## 2021-04-17 DIAGNOSIS — I2510 Atherosclerotic heart disease of native coronary artery without angina pectoris: Secondary | ICD-10-CM | POA: Diagnosis not present

## 2021-04-17 DIAGNOSIS — J449 Chronic obstructive pulmonary disease, unspecified: Secondary | ICD-10-CM | POA: Diagnosis not present

## 2021-04-17 DIAGNOSIS — F322 Major depressive disorder, single episode, severe without psychotic features: Secondary | ICD-10-CM | POA: Diagnosis not present

## 2021-04-17 DIAGNOSIS — G40909 Epilepsy, unspecified, not intractable, without status epilepticus: Secondary | ICD-10-CM | POA: Diagnosis not present

## 2021-04-17 DIAGNOSIS — E785 Hyperlipidemia, unspecified: Secondary | ICD-10-CM | POA: Diagnosis not present

## 2021-04-17 DIAGNOSIS — R1310 Dysphagia, unspecified: Secondary | ICD-10-CM | POA: Diagnosis not present

## 2021-04-17 DIAGNOSIS — I252 Old myocardial infarction: Secondary | ICD-10-CM | POA: Diagnosis not present

## 2021-04-17 DIAGNOSIS — G629 Polyneuropathy, unspecified: Secondary | ICD-10-CM | POA: Diagnosis not present

## 2021-04-17 DIAGNOSIS — I951 Orthostatic hypotension: Secondary | ICD-10-CM | POA: Diagnosis not present

## 2021-04-17 DIAGNOSIS — I350 Nonrheumatic aortic (valve) stenosis: Secondary | ICD-10-CM | POA: Diagnosis not present

## 2021-04-17 DIAGNOSIS — Z8781 Personal history of (healed) traumatic fracture: Secondary | ICD-10-CM | POA: Diagnosis not present

## 2021-04-17 DIAGNOSIS — Z7901 Long term (current) use of anticoagulants: Secondary | ICD-10-CM | POA: Diagnosis not present

## 2021-04-17 DIAGNOSIS — E440 Moderate protein-calorie malnutrition: Secondary | ICD-10-CM | POA: Diagnosis not present

## 2021-04-17 DIAGNOSIS — R627 Adult failure to thrive: Secondary | ICD-10-CM | POA: Diagnosis not present

## 2021-04-17 DIAGNOSIS — Z8673 Personal history of transient ischemic attack (TIA), and cerebral infarction without residual deficits: Secondary | ICD-10-CM | POA: Diagnosis not present

## 2021-04-17 DIAGNOSIS — N1831 Chronic kidney disease, stage 3a: Secondary | ICD-10-CM | POA: Diagnosis not present

## 2021-04-17 DIAGNOSIS — R32 Unspecified urinary incontinence: Secondary | ICD-10-CM | POA: Diagnosis not present

## 2021-04-17 DIAGNOSIS — G4733 Obstructive sleep apnea (adult) (pediatric): Secondary | ICD-10-CM | POA: Diagnosis not present

## 2021-04-18 DIAGNOSIS — I951 Orthostatic hypotension: Secondary | ICD-10-CM | POA: Diagnosis not present

## 2021-04-18 DIAGNOSIS — G40909 Epilepsy, unspecified, not intractable, without status epilepticus: Secondary | ICD-10-CM | POA: Diagnosis not present

## 2021-04-18 DIAGNOSIS — N1831 Chronic kidney disease, stage 3a: Secondary | ICD-10-CM | POA: Diagnosis not present

## 2021-04-18 DIAGNOSIS — I2510 Atherosclerotic heart disease of native coronary artery without angina pectoris: Secondary | ICD-10-CM | POA: Diagnosis not present

## 2021-04-18 DIAGNOSIS — I480 Paroxysmal atrial fibrillation: Secondary | ICD-10-CM | POA: Diagnosis not present

## 2021-04-18 DIAGNOSIS — I129 Hypertensive chronic kidney disease with stage 1 through stage 4 chronic kidney disease, or unspecified chronic kidney disease: Secondary | ICD-10-CM | POA: Diagnosis not present

## 2021-04-22 NOTE — Progress Notes (Signed)
[facility] PALLIATIVE CARE RN NOTE  PATIENT NAME: Derek [name] DOB: [birth year] MRN: [id number]  PRIMARY CARE PROVIDER: Seward Carol, MD  RESPONSIBLE PARTY: Sheryn Bison (son) Acct ID - Guarantor Home Phone Work Phone Relationship Acct Type  [id number] [place] [phone]  Self P/F     [place], [place], [place] [address]   Covid-19 Pre-screening Negative  PLAN OF CARE and INTERVENTION:  ADVANCE CARE PLANNING/GOALS OF CARE: Goal is for patient to remain in his home and maintain current strength level. He has a DNR. PATIENT/CAREGIVER EDUCATION: Symptom management, safe mobility DISEASE STATUS: Palliative care visit completed with patient, son and hired caregiver in the home. Upon my arrival, patient is sitting up in the kitchen awake and alert. He is pleasant and smiling. Expressive aphasia noted, but he is able to answer some questions appropriately. His dementia is worsening per son. He denies pain. Respirations are even, regular and unlabored. Coughing mainly only noted when patient is drinking thin liquids and drinks too much at one time. Son and caregiver are noticing that patient is much weaker. He is able to bear weight, but often time family has to use more of their strength to transfer him. However, there are times when patient will get out of bed on his own and walk to the bathroom without his walker. He will constantly reach for nearby furniture or wall to keep him from falling. He is transported via wheelchair. Son says that there was an order sent to his PCP for more PT for at least 1x/week to maintain his current strength. He continues to sleep in his recliner most of the time after meals and in his hospital bed at night. Caregiver does try to have patient participate in drawing and writing activities and showed me some of his pictures. Lines were squiggly, but I was able to recognize the letters and numbers. His intake is remains good. He is taking his  medications whole with water. No edema noted, as they are keeping his legs elevated as much as possible. His blood pressure is slightly elevated at 152/92 but he just took his medications about 30 minutes ago. His Norvasc was decreased 2 months ago to 2.5 mg due to low blood pressures. Son continues to give Metoprolol 25 mg in the am and 12.5 mg in the evenings. Heart rate normal at 61. He has a small reddened area to his forehead where patient often picks. It does not appear to be infected. Will continue to monitor.   HISTORY OF PRESENT ILLNESS: This is a 87 yo male with a history of dementia, pAfib, CAD, moderate aortic stenosis, HTN, HLD and CKD stage 3.  Palliative care team continues to follow patient for symptom management, goals of care and complex decision making.    CODE STATUS: DNR ADVANCED DIRECTIVES: Y MOST FORM: no PPS: weak 40%   PHYSICAL EXAM:   VITALS: Today's Vitals   04/17/21 1017  BP: (!) 152/92  Pulse: 61  Resp: 16  Temp: 97.6 F (36.4 C)  TempSrc: Temporal  SpO2: 98%  PainSc: 0-No pain    LUNGS: clear to auscultation  CARDIAC: Cor RRR EXTREMITIES: No edema SKIN:  Small red area to forehead from patient picking; other skin is dry and intact   NEURO:  Alert and oriented to person/place, expressive aphasia which causes frustration at times, pleasant mood, increased generalized weakness, ambulatory with walker less than 20 ft, otherwise transported via wheelchair   (Duration of visit and documentation 45 minutes)  Derek Eastern, RN, BSN

## 2021-04-23 DIAGNOSIS — I480 Paroxysmal atrial fibrillation: Secondary | ICD-10-CM | POA: Diagnosis not present

## 2021-04-23 DIAGNOSIS — I129 Hypertensive chronic kidney disease with stage 1 through stage 4 chronic kidney disease, or unspecified chronic kidney disease: Secondary | ICD-10-CM | POA: Diagnosis not present

## 2021-04-23 DIAGNOSIS — I2510 Atherosclerotic heart disease of native coronary artery without angina pectoris: Secondary | ICD-10-CM | POA: Diagnosis not present

## 2021-04-23 DIAGNOSIS — N1831 Chronic kidney disease, stage 3a: Secondary | ICD-10-CM | POA: Diagnosis not present

## 2021-04-23 DIAGNOSIS — I951 Orthostatic hypotension: Secondary | ICD-10-CM | POA: Diagnosis not present

## 2021-04-23 DIAGNOSIS — R350 Frequency of micturition: Secondary | ICD-10-CM | POA: Diagnosis not present

## 2021-04-23 DIAGNOSIS — G40909 Epilepsy, unspecified, not intractable, without status epilepticus: Secondary | ICD-10-CM | POA: Diagnosis not present

## 2021-04-30 ENCOUNTER — Ambulatory Visit: Payer: Medicare Other | Admitting: Podiatry

## 2021-05-02 DIAGNOSIS — N1831 Chronic kidney disease, stage 3a: Secondary | ICD-10-CM | POA: Diagnosis not present

## 2021-05-02 DIAGNOSIS — I480 Paroxysmal atrial fibrillation: Secondary | ICD-10-CM | POA: Diagnosis not present

## 2021-05-02 DIAGNOSIS — I2510 Atherosclerotic heart disease of native coronary artery without angina pectoris: Secondary | ICD-10-CM | POA: Diagnosis not present

## 2021-05-02 DIAGNOSIS — I129 Hypertensive chronic kidney disease with stage 1 through stage 4 chronic kidney disease, or unspecified chronic kidney disease: Secondary | ICD-10-CM | POA: Diagnosis not present

## 2021-05-02 DIAGNOSIS — G40909 Epilepsy, unspecified, not intractable, without status epilepticus: Secondary | ICD-10-CM | POA: Diagnosis not present

## 2021-05-02 DIAGNOSIS — I951 Orthostatic hypotension: Secondary | ICD-10-CM | POA: Diagnosis not present

## 2021-05-07 ENCOUNTER — Other Ambulatory Visit: Payer: Medicare Other | Admitting: *Deleted

## 2021-05-07 ENCOUNTER — Other Ambulatory Visit: Payer: Self-pay

## 2021-05-07 DIAGNOSIS — I129 Hypertensive chronic kidney disease with stage 1 through stage 4 chronic kidney disease, or unspecified chronic kidney disease: Secondary | ICD-10-CM | POA: Diagnosis not present

## 2021-05-07 DIAGNOSIS — I2510 Atherosclerotic heart disease of native coronary artery without angina pectoris: Secondary | ICD-10-CM | POA: Diagnosis not present

## 2021-05-07 DIAGNOSIS — G40909 Epilepsy, unspecified, not intractable, without status epilepticus: Secondary | ICD-10-CM | POA: Diagnosis not present

## 2021-05-07 DIAGNOSIS — N1831 Chronic kidney disease, stage 3a: Secondary | ICD-10-CM | POA: Diagnosis not present

## 2021-05-07 DIAGNOSIS — I951 Orthostatic hypotension: Secondary | ICD-10-CM | POA: Diagnosis not present

## 2021-05-07 DIAGNOSIS — Z515 Encounter for palliative care: Secondary | ICD-10-CM

## 2021-05-07 DIAGNOSIS — I480 Paroxysmal atrial fibrillation: Secondary | ICD-10-CM | POA: Diagnosis not present

## 2021-05-17 DIAGNOSIS — I129 Hypertensive chronic kidney disease with stage 1 through stage 4 chronic kidney disease, or unspecified chronic kidney disease: Secondary | ICD-10-CM | POA: Diagnosis not present

## 2021-05-17 DIAGNOSIS — E785 Hyperlipidemia, unspecified: Secondary | ICD-10-CM | POA: Diagnosis not present

## 2021-05-17 DIAGNOSIS — I2510 Atherosclerotic heart disease of native coronary artery without angina pectoris: Secondary | ICD-10-CM | POA: Diagnosis not present

## 2021-05-17 DIAGNOSIS — N400 Enlarged prostate without lower urinary tract symptoms: Secondary | ICD-10-CM | POA: Diagnosis not present

## 2021-05-17 DIAGNOSIS — Z8781 Personal history of (healed) traumatic fracture: Secondary | ICD-10-CM | POA: Diagnosis not present

## 2021-05-17 DIAGNOSIS — R627 Adult failure to thrive: Secondary | ICD-10-CM | POA: Diagnosis not present

## 2021-05-17 DIAGNOSIS — F0280 Dementia in other diseases classified elsewhere without behavioral disturbance: Secondary | ICD-10-CM | POA: Diagnosis not present

## 2021-05-17 DIAGNOSIS — G40909 Epilepsy, unspecified, not intractable, without status epilepticus: Secondary | ICD-10-CM | POA: Diagnosis not present

## 2021-05-17 DIAGNOSIS — F322 Major depressive disorder, single episode, severe without psychotic features: Secondary | ICD-10-CM | POA: Diagnosis not present

## 2021-05-17 DIAGNOSIS — I480 Paroxysmal atrial fibrillation: Secondary | ICD-10-CM | POA: Diagnosis not present

## 2021-05-17 DIAGNOSIS — I951 Orthostatic hypotension: Secondary | ICD-10-CM | POA: Diagnosis not present

## 2021-05-17 DIAGNOSIS — Z8673 Personal history of transient ischemic attack (TIA), and cerebral infarction without residual deficits: Secondary | ICD-10-CM | POA: Diagnosis not present

## 2021-05-17 DIAGNOSIS — K219 Gastro-esophageal reflux disease without esophagitis: Secondary | ICD-10-CM | POA: Diagnosis not present

## 2021-05-17 DIAGNOSIS — G629 Polyneuropathy, unspecified: Secondary | ICD-10-CM | POA: Diagnosis not present

## 2021-05-17 DIAGNOSIS — R32 Unspecified urinary incontinence: Secondary | ICD-10-CM | POA: Diagnosis not present

## 2021-05-17 DIAGNOSIS — R1310 Dysphagia, unspecified: Secondary | ICD-10-CM | POA: Diagnosis not present

## 2021-05-17 DIAGNOSIS — N1831 Chronic kidney disease, stage 3a: Secondary | ICD-10-CM | POA: Diagnosis not present

## 2021-05-17 DIAGNOSIS — E440 Moderate protein-calorie malnutrition: Secondary | ICD-10-CM | POA: Diagnosis not present

## 2021-05-17 DIAGNOSIS — J449 Chronic obstructive pulmonary disease, unspecified: Secondary | ICD-10-CM | POA: Diagnosis not present

## 2021-05-17 DIAGNOSIS — I350 Nonrheumatic aortic (valve) stenosis: Secondary | ICD-10-CM | POA: Diagnosis not present

## 2021-05-17 DIAGNOSIS — Z8701 Personal history of pneumonia (recurrent): Secondary | ICD-10-CM | POA: Diagnosis not present

## 2021-05-17 DIAGNOSIS — I700 Atherosclerosis of aorta: Secondary | ICD-10-CM | POA: Diagnosis not present

## 2021-05-17 DIAGNOSIS — I252 Old myocardial infarction: Secondary | ICD-10-CM | POA: Diagnosis not present

## 2021-05-17 DIAGNOSIS — Z7901 Long term (current) use of anticoagulants: Secondary | ICD-10-CM | POA: Diagnosis not present

## 2021-05-17 DIAGNOSIS — G4733 Obstructive sleep apnea (adult) (pediatric): Secondary | ICD-10-CM | POA: Diagnosis not present

## 2021-05-20 ENCOUNTER — Telehealth: Payer: Self-pay | Admitting: *Deleted

## 2021-05-20 NOTE — Progress Notes (Signed)
[facility] PALLIATIVE CARE RN NOTE  PATIENT NAME: Derek [name] DOB: 01/27/1927 MRN: [id number]  PRIMARY CARE PROVIDER: Seward Carol, MD  RESPONSIBLE PARTY: Sheryn Bison. (son) Acct ID - Guarantor Home Phone Work Phone Relationship Acct Type  [id number] [place] [phone]  Self P/F     [address] [place],  [address]   Due to the COVID-19 crisis, this virtual check-in visit was done via telephone from my office and it was initiated and consent by this patient and or family.  PLAN OF CARE and INTERVENTION:  ADVANCE CARE PLANNING/GOALS OF CARE: Goal is for patient to remain in his home. He has a DNR. PATIENT/CAREGIVER EDUCATION: Symptom management, safe mobility/transfers, s/s of infection DISEASE STATUS: Virtual check-in visit completed via telephone. Patient is not experiencing any pain. Son reports that patient is "starting to go downhill." He is becoming progressively weaker. Recently he has been more lethargic and sleeping more overall. His urine also had a very strong odor. Son and hired caregiver performed an in-home urinalysis which showed his urine is suspicious for UTI. Sample was taken to the PCP and patient was started on Cipro 250 mg twice daily x 3 days. He has noticed an improvement in patient's energy level since completing this antibiotic. Son wants patient to continue to be monitored by palliative care on a monthly basis since patient continues to decline overall. Will continue to monitor.  HISTORY OF PRESENT ILLNESS:  This is a 87 yo male with a history of dementia, pAfib, CAD, moderate aortic stenosis, HTN, HLD and CKD stage 3.  Palliative care team continues to follow patient for symptom management, goals of care and complex decision making.    CODE STATUS: DNR ADVANCED DIRECTIVES: Y MOST FORM: no PPS: weak 40%   (Duration of visit and documentation    Daryl Eastern, RN BSN

## 2021-05-20 NOTE — Telephone Encounter (Signed)
Received a call from patient's son stating that they have noticed another decline in patient's overall condition He is less communicative and despondent. He is also much weaker and now requiring 2 person assistance with standing and transfers. Son requesting a RN visit. Visit scheduled for tomorrow 05/21/21 at [facility].

## 2021-05-21 ENCOUNTER — Other Ambulatory Visit: Payer: Self-pay

## 2021-05-21 ENCOUNTER — Other Ambulatory Visit: Payer: Medicare Other | Admitting: *Deleted

## 2021-05-21 VITALS — BP 160/73 | HR 58 | Temp 97.8°F | Resp 16

## 2021-05-21 DIAGNOSIS — Z515 Encounter for palliative care: Secondary | ICD-10-CM

## 2021-05-21 NOTE — Progress Notes (Signed)
[name]  PATIENT NAME: Derek [name] DOB: Dec 30, 1926 MRN: [id number]  PRIMARY CARE PROVIDER: Seward Carol, MD  RESPONSIBLE PARTY: Sheryn Bison (son) Acct ID - Guarantor Home Phone Work Phone Relationship Acct Type  [id number] [place] [phone]  Self P/F     [address] [place], [place] [address]   Covid-19 Pre-screening Negative  PLAN OF CARE and INTERVENTION:  ADVANCE CARE PLANNING/GOALS OF CARE: Goal is for patient to remain in his home and avoid hospitalizations. He has a DNR. PATIENT/CAREGIVER EDUCATION: Symptom management, safe transfers, s/s of infection DISEASE STATUS: RN visit requested by patient's son. Met with patient, son and hired caregiver in the home. Patient continues with an overall decline, more rapid in the past week. Patient is less communicative and decreased comprehension. He is having more difficulty following commands. He has a slowed response along with slowed speech. He is more confused. Voice is weak. During visit, patient is lying in bed awake and alert. He does have trouble forming words, but some words are clear. He is more alert today than usual per son. However, he is still spending most of his time lying in bed. He is getting up later in the day, usually around 12p. He is sleeping about 20 hours per day. He is progressively weaker. He now requires 2 person assistance with standing and transfers. During meals, he starts off feeding himself but caregiver soon has to take over. His intake is decreasing. He eats 2 meals per day and about 50% less than what he used to. He did not eat any dinner last night. He denies pain or shortness of breath. He is incontinent of both bowel and bladder and wears Depends. His urine has a very foul odor and dark/cloudy in appearance. He was recently treated with a UTI with Cipro twice daily for 3 days. Initially, patient improved as he was more alert, less confused and not as  weak after taking them, but shortly started to decline again. Son is requesting another trial of antibiotics but for a longer period of time to see if patient will improve. If after the course of antibiotics patient does not improve, he is agreeable to transitioning patient to hospice care. I called and spoke to our palliative care physician who prescribed Cipro 250 mg twice daily x 7 days. I called in prescription to [facility] and made son aware. He is Patent attorney. He will contact me with a patient update next week.   HISTORY OF PRESENT ILLNESS:   This is a 87 yo male with a history of dementia, pAfib, CAD, moderate aortic stenosis, HTN, HLD and CKD stage 3.  Palliative care team continues to follow patient for symptom management, goals of care and complex decision making.    CODE STATUS: DNR ADVANCED DIRECTIVES: Y MOST FORM: no PPS: 30%   PHYSICAL EXAM:   VITALS: Today's Vitals   05/21/21 1017  BP: (!) 160/73  Pulse: (!) 58  Resp: 16  Temp: 97.8 F (36.6 C)  TempSrc: Temporal  SpO2: 93%  PainSc: 0-No pain    LUNGS: clear to auscultation  CARDIAC: Cor RRR EXTREMITIES: No edema SKIN:  Exposed skin is dry and intact; patient has a bandaid noted to his forehead from picking at a sore   NEURO:  Alert and oriented to self, expressive aphagia, increased generalized weakness, non-ambulatory   (Duration of visit and documentation 45 minutes)   Daryl Eastern, RN BSN

## 2021-05-23 ENCOUNTER — Other Ambulatory Visit: Payer: Self-pay | Admitting: Interventional Cardiology

## 2021-05-28 ENCOUNTER — Other Ambulatory Visit: Payer: Medicare Other | Admitting: *Deleted

## 2021-05-28 ENCOUNTER — Other Ambulatory Visit: Payer: Self-pay | Admitting: Interventional Cardiology

## 2021-05-28 DIAGNOSIS — Z515 Encounter for palliative care: Secondary | ICD-10-CM

## 2021-05-28 NOTE — Telephone Encounter (Addendum)
Prescription refill request for Eliquis received. Indication: Afib  Last office visit: 12/12/20 Tamala Julian)  Scr: 1.46 (12/08/20) Age: 87 Weight: 84.4kg   Pt is currently on Eliquis 2.5mg  BID, but qualifies for a dosage increase.   Most recent serum creatinines they have remained <1.5 since 06/2020, last creat >1.5 was 01/31/20 were it was exactly 1.5.  Has been checked multiple times since than and it has remained less than 1.5.    Per Dr Thompson Caul note on 12/03/20 -  "He faints with associated injury. Leave the Eliquis at lower dose."   Appropriate dose and refill sent to requested pharmacy.

## 2021-05-29 ENCOUNTER — Other Ambulatory Visit: Payer: Self-pay

## 2021-05-29 ENCOUNTER — Telehealth: Payer: Self-pay | Admitting: *Deleted

## 2021-05-29 DIAGNOSIS — K219 Gastro-esophageal reflux disease without esophagitis: Secondary | ICD-10-CM | POA: Diagnosis not present

## 2021-05-29 DIAGNOSIS — E785 Hyperlipidemia, unspecified: Secondary | ICD-10-CM | POA: Diagnosis not present

## 2021-05-29 DIAGNOSIS — N183 Chronic kidney disease, stage 3 unspecified: Secondary | ICD-10-CM | POA: Diagnosis not present

## 2021-05-29 DIAGNOSIS — E7800 Pure hypercholesterolemia, unspecified: Secondary | ICD-10-CM | POA: Diagnosis not present

## 2021-05-29 DIAGNOSIS — N400 Enlarged prostate without lower urinary tract symptoms: Secondary | ICD-10-CM | POA: Diagnosis not present

## 2021-05-29 DIAGNOSIS — F322 Major depressive disorder, single episode, severe without psychotic features: Secondary | ICD-10-CM | POA: Diagnosis not present

## 2021-05-29 DIAGNOSIS — G459 Transient cerebral ischemic attack, unspecified: Secondary | ICD-10-CM | POA: Diagnosis not present

## 2021-05-29 DIAGNOSIS — I2510 Atherosclerotic heart disease of native coronary artery without angina pectoris: Secondary | ICD-10-CM | POA: Diagnosis not present

## 2021-05-29 DIAGNOSIS — I10 Essential (primary) hypertension: Secondary | ICD-10-CM | POA: Diagnosis not present

## 2021-05-29 DIAGNOSIS — F0390 Unspecified dementia without behavioral disturbance: Secondary | ICD-10-CM | POA: Diagnosis not present

## 2021-05-29 DIAGNOSIS — I129 Hypertensive chronic kidney disease with stage 1 through stage 4 chronic kidney disease, or unspecified chronic kidney disease: Secondary | ICD-10-CM | POA: Diagnosis not present

## 2021-05-29 NOTE — Progress Notes (Signed)
[name] RN NOTE  PATIENT NAME: Derek [name] DOB: [birth year] MRN: [id number]  PRIMARY CARE PROVIDER: Seward Carol, MD  RESPONSIBLE PARTY: Sheryn Bison (son) Acct ID - Guarantor Home Phone Work Phone Relationship Acct Type  [id number] [place] [id number]  Self P/F     [address], [place], [place] [address]   Due to the COVID-19 crisis, this virtual check-in visit was done via telephone from my office and it was initiated and consent by this patient and or family.  PLAN OF CARE and INTERVENTION:  ADVANCE CARE PLANNING/GOALS OF CARE: Goal is for patient to remain in his home with comfort measures only. He has a DNR. PATIENT/CAREGIVER EDUCATION: Hospice education DISEASE STATUS: Virtual check-in visit completed via telephone. Patient has completed his 7 day course of oral Cipro for suspected UTI. Son reports that patient continues to decline despite taking this antibiotic. In the past after use of antibiotics, patient's lethargy and weakness seemed to improved. However this time, he continues with less communication, difficulty following commands, requiring 2 person assistance with transfers, and decrease in oral intake. He remains incontinent of both bowel and bladder and wears Depends. He is transported via wheelchair. Son is now ready to transition patient to hospice care. I called and left a voicemail with Dr. Lina Sar CMA Darlene requesting a verbal order for a hospice consult. Awaiting return call.   HISTORY OF PRESENT ILLNESS:   This is a 88 yo male with a history of dementia, pAfib, CAD, moderate aortic stenosis, HTN, HLD and CKD stage 3. I have requested a hospice order from Dr. Delfina Redwood to transition patient to hospice care.  CODE STATUS: DNR ADVANCED DIRECTIVES: Y MOST FORM: no PPS: 30%   (Duration of visit and documentation    Daryl Eastern, RN BSN

## 2021-05-29 NOTE — Telephone Encounter (Signed)
12:42 pm Received a return call from Dr. Lina Sar CMA Darlene who was responding to a voicemail I left yesterday requesting a verbal order for a hospice consult. She advised that Dr. Delfina Redwood gave the verbal order for hospice and also agrees to be the attending. I sent this information over to [facility]'s referral center to make them aware. I also called and spoke with patient's son to provide update and to advise that one of our referral center specialists will be contacting him to schedule date/time of hospice admission. He is Patent attorney.

## 2021-06-03 DIAGNOSIS — E785 Hyperlipidemia, unspecified: Secondary | ICD-10-CM | POA: Diagnosis not present

## 2021-06-03 DIAGNOSIS — H3530 Unspecified macular degeneration: Secondary | ICD-10-CM | POA: Diagnosis not present

## 2021-06-03 DIAGNOSIS — I4891 Unspecified atrial fibrillation: Secondary | ICD-10-CM | POA: Diagnosis not present

## 2021-06-03 DIAGNOSIS — I2510 Atherosclerotic heart disease of native coronary artery without angina pectoris: Secondary | ICD-10-CM | POA: Diagnosis not present

## 2021-06-03 DIAGNOSIS — G40909 Epilepsy, unspecified, not intractable, without status epilepticus: Secondary | ICD-10-CM | POA: Diagnosis not present

## 2021-06-03 DIAGNOSIS — F0280 Dementia in other diseases classified elsewhere without behavioral disturbance: Secondary | ICD-10-CM | POA: Diagnosis not present

## 2021-06-03 DIAGNOSIS — N1831 Chronic kidney disease, stage 3a: Secondary | ICD-10-CM | POA: Diagnosis not present

## 2021-06-03 DIAGNOSIS — K219 Gastro-esophageal reflux disease without esophagitis: Secondary | ICD-10-CM | POA: Diagnosis not present

## 2021-06-03 DIAGNOSIS — I951 Orthostatic hypotension: Secondary | ICD-10-CM | POA: Diagnosis not present

## 2021-06-03 DIAGNOSIS — I129 Hypertensive chronic kidney disease with stage 1 through stage 4 chronic kidney disease, or unspecified chronic kidney disease: Secondary | ICD-10-CM | POA: Diagnosis not present

## 2021-06-03 DIAGNOSIS — I480 Paroxysmal atrial fibrillation: Secondary | ICD-10-CM | POA: Diagnosis not present

## 2021-06-03 DIAGNOSIS — G309 Alzheimer's disease, unspecified: Secondary | ICD-10-CM | POA: Diagnosis not present

## 2021-06-03 DIAGNOSIS — R1310 Dysphagia, unspecified: Secondary | ICD-10-CM | POA: Diagnosis not present

## 2021-06-03 DIAGNOSIS — N183 Chronic kidney disease, stage 3 unspecified: Secondary | ICD-10-CM | POA: Diagnosis not present

## 2021-06-04 DIAGNOSIS — I129 Hypertensive chronic kidney disease with stage 1 through stage 4 chronic kidney disease, or unspecified chronic kidney disease: Secondary | ICD-10-CM | POA: Diagnosis not present

## 2021-06-04 DIAGNOSIS — G309 Alzheimer's disease, unspecified: Secondary | ICD-10-CM | POA: Diagnosis not present

## 2021-06-04 DIAGNOSIS — R1310 Dysphagia, unspecified: Secondary | ICD-10-CM | POA: Diagnosis not present

## 2021-06-04 DIAGNOSIS — I2510 Atherosclerotic heart disease of native coronary artery without angina pectoris: Secondary | ICD-10-CM | POA: Diagnosis not present

## 2021-06-04 DIAGNOSIS — F0280 Dementia in other diseases classified elsewhere without behavioral disturbance: Secondary | ICD-10-CM | POA: Diagnosis not present

## 2021-06-04 DIAGNOSIS — I4891 Unspecified atrial fibrillation: Secondary | ICD-10-CM | POA: Diagnosis not present

## 2021-06-05 DIAGNOSIS — I4891 Unspecified atrial fibrillation: Secondary | ICD-10-CM | POA: Diagnosis not present

## 2021-06-05 DIAGNOSIS — F0280 Dementia in other diseases classified elsewhere without behavioral disturbance: Secondary | ICD-10-CM | POA: Diagnosis not present

## 2021-06-05 DIAGNOSIS — R1310 Dysphagia, unspecified: Secondary | ICD-10-CM | POA: Diagnosis not present

## 2021-06-05 DIAGNOSIS — I2510 Atherosclerotic heart disease of native coronary artery without angina pectoris: Secondary | ICD-10-CM | POA: Diagnosis not present

## 2021-06-05 DIAGNOSIS — G309 Alzheimer's disease, unspecified: Secondary | ICD-10-CM | POA: Diagnosis not present

## 2021-06-05 DIAGNOSIS — I129 Hypertensive chronic kidney disease with stage 1 through stage 4 chronic kidney disease, or unspecified chronic kidney disease: Secondary | ICD-10-CM | POA: Diagnosis not present

## 2021-06-06 ENCOUNTER — Ambulatory Visit: Payer: Medicare Other | Admitting: Neurology

## 2021-06-08 DIAGNOSIS — F0280 Dementia in other diseases classified elsewhere without behavioral disturbance: Secondary | ICD-10-CM | POA: Diagnosis not present

## 2021-06-08 DIAGNOSIS — I4891 Unspecified atrial fibrillation: Secondary | ICD-10-CM | POA: Diagnosis not present

## 2021-06-08 DIAGNOSIS — E785 Hyperlipidemia, unspecified: Secondary | ICD-10-CM | POA: Diagnosis not present

## 2021-06-08 DIAGNOSIS — H3530 Unspecified macular degeneration: Secondary | ICD-10-CM | POA: Diagnosis not present

## 2021-06-08 DIAGNOSIS — N183 Chronic kidney disease, stage 3 unspecified: Secondary | ICD-10-CM | POA: Diagnosis not present

## 2021-06-08 DIAGNOSIS — I2510 Atherosclerotic heart disease of native coronary artery without angina pectoris: Secondary | ICD-10-CM | POA: Diagnosis not present

## 2021-06-08 DIAGNOSIS — K219 Gastro-esophageal reflux disease without esophagitis: Secondary | ICD-10-CM | POA: Diagnosis not present

## 2021-06-08 DIAGNOSIS — I129 Hypertensive chronic kidney disease with stage 1 through stage 4 chronic kidney disease, or unspecified chronic kidney disease: Secondary | ICD-10-CM | POA: Diagnosis not present

## 2021-06-08 DIAGNOSIS — G309 Alzheimer's disease, unspecified: Secondary | ICD-10-CM | POA: Diagnosis not present

## 2021-06-08 DIAGNOSIS — R1310 Dysphagia, unspecified: Secondary | ICD-10-CM | POA: Diagnosis not present

## 2021-06-21 DIAGNOSIS — I2510 Atherosclerotic heart disease of native coronary artery without angina pectoris: Secondary | ICD-10-CM | POA: Diagnosis not present

## 2021-06-21 DIAGNOSIS — I129 Hypertensive chronic kidney disease with stage 1 through stage 4 chronic kidney disease, or unspecified chronic kidney disease: Secondary | ICD-10-CM | POA: Diagnosis not present

## 2021-06-21 DIAGNOSIS — F0280 Dementia in other diseases classified elsewhere without behavioral disturbance: Secondary | ICD-10-CM | POA: Diagnosis not present

## 2021-06-21 DIAGNOSIS — R1310 Dysphagia, unspecified: Secondary | ICD-10-CM | POA: Diagnosis not present

## 2021-06-21 DIAGNOSIS — I4891 Unspecified atrial fibrillation: Secondary | ICD-10-CM | POA: Diagnosis not present

## 2021-06-21 DIAGNOSIS — G309 Alzheimer's disease, unspecified: Secondary | ICD-10-CM | POA: Diagnosis not present

## 2021-06-27 IMAGING — RF DG SWALLOWING FUNCTION
17 series · 24 of 24 positions shown · non-contrast
Comparison: None.

CLINICAL DATA: Dysphagia.

EXAM:
MODIFIED BARIUM SWALLOW
TECHNIQUE: Different consistencies of barium were administered orally to the
patient by the Speech Pathologist. Imaging of the pharynx was
performed in the lateral projection. The radiologist was present in
the fluoroscopy room for this study, providing personal supervision.
FLUOROSCOPY TIME:  Fluoroscopy Time:  2 minutes and 54 seconds
Radiation Exposure Index (if provided by the fluoroscopic device):
9.2 mGy
Number of Acquired Spot Images: 0

[Series 1: cp_standard · 0.25mm/px · 1 of 1 slices shown (1 of 17)]
[im 1/1]
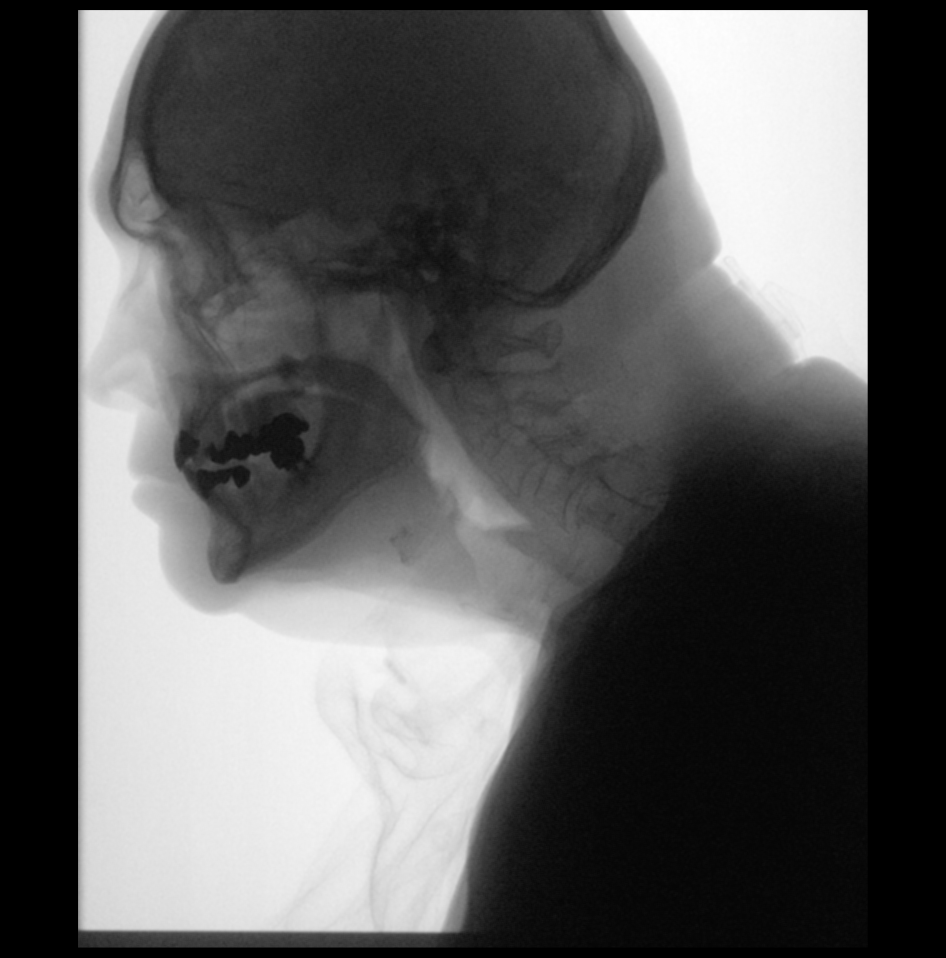

[Series 3: cp_standard · 0.34mm/px · 2 of 76 frames shown (2 of 17)]
[frame 12/76]
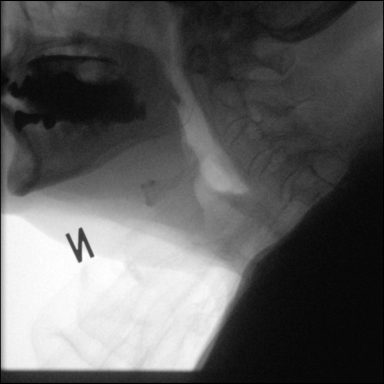
[frame 65/76]
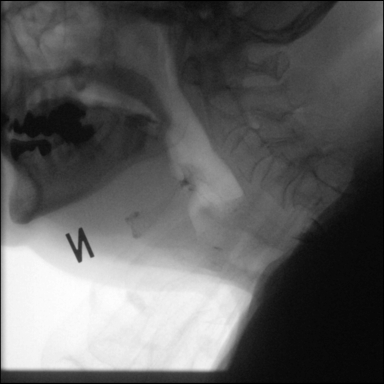

[Series 4: cp_standard · 0.34mm/px · 1 of 26 frames shown (3 of 17)]
[frame 14/26]
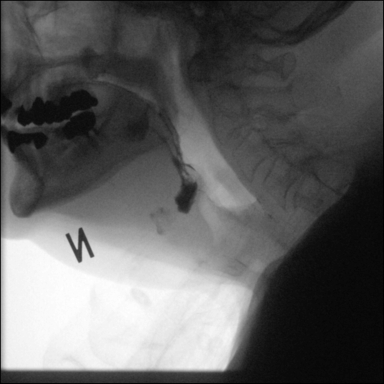

[Series 5: cp_standard · 0.34mm/px · 1 of 74 frames shown (4 of 17)]
[frame 38/74]
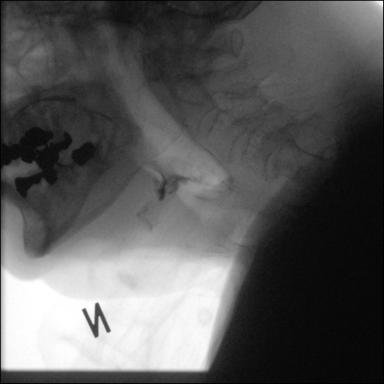

[Series 6: cp_standard · 0.34mm/px · 2 of 268 frames shown (5 of 17)]
[frame 41/268]
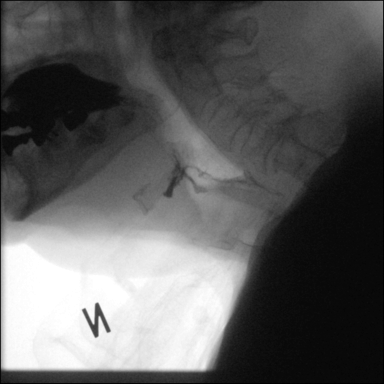
[frame 228/268]
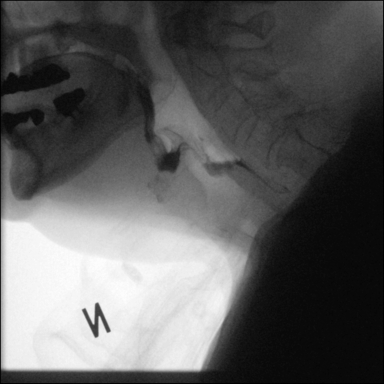

[Series 7: cp_standard · 0.34mm/px · 1 of 90 frames shown (6 of 17)]
[frame 14/90]
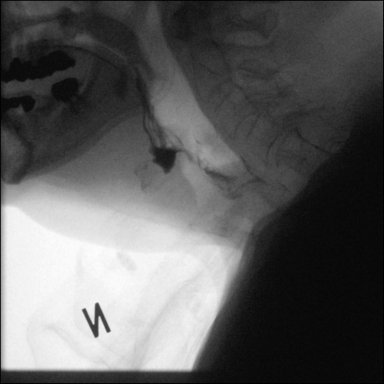

[Series 8: cp_standard · 0.34mm/px · 2 of 242 frames shown (7 of 17)]
[frame 37/242]
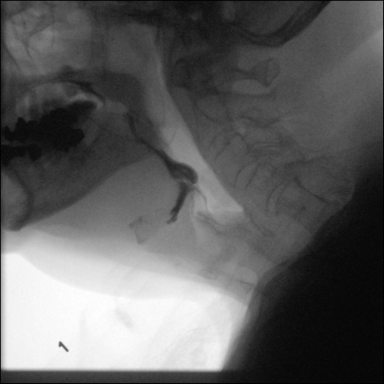
[frame 206/242]
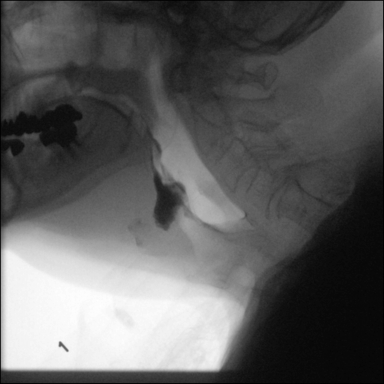

[Series 9: cp_standard · 0.34mm/px · 1 of 57 frames shown (8 of 17)]
[frame 29/57]
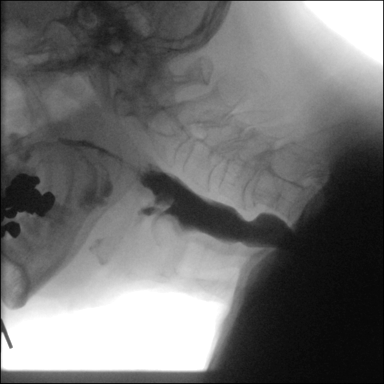

[Series 10: cp_standard · 0.34mm/px · 1 of 41 frames shown (9 of 17)]
[frame 7/41]
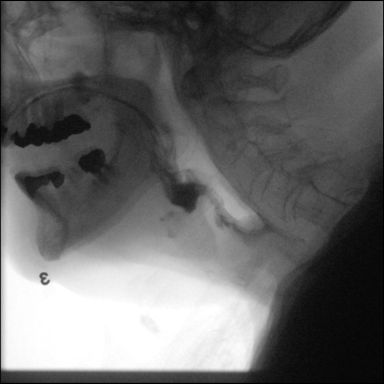

[Series 11: cp_standard · 0.34mm/px · 2 of 68 frames shown (10 of 17)]
[frame 11/68]
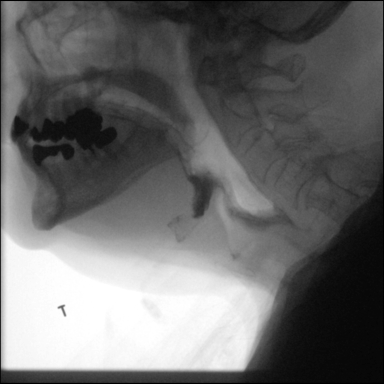
[frame 68/68]
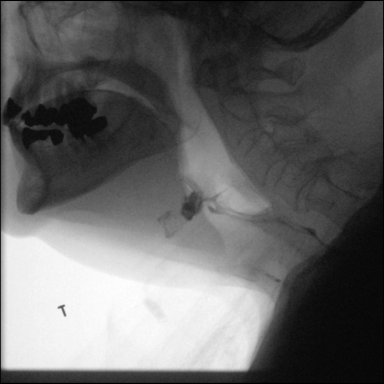

[Series 12: cp_standard · 0.34mm/px · 1 of 141 frames shown (11 of 17)]
[frame 53/141]
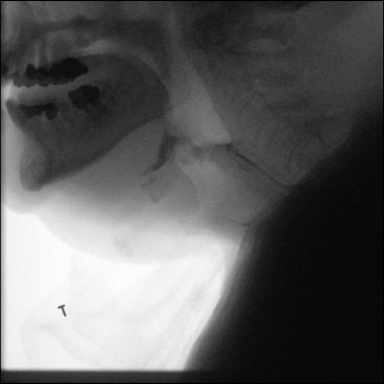

[Series 13: cp_standard · 0.34mm/px · 2 of 122 frames shown (12 of 17)]
[frame 19/122]
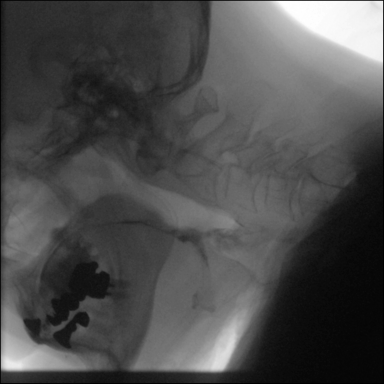
[frame 104/122]
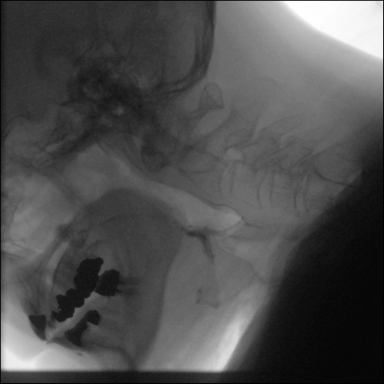

[Series 14: cp_standard · 0.34mm/px · 1 of 80 frames shown (13 of 17)]
[frame 41/80]
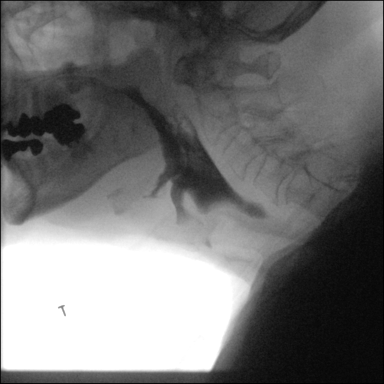

[Series 15: cp_standard · 0.34mm/px · 2 of 76 frames shown (14 of 17)]
[frame 12/76]
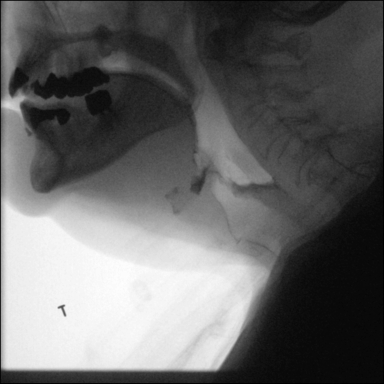
[frame 76/76]
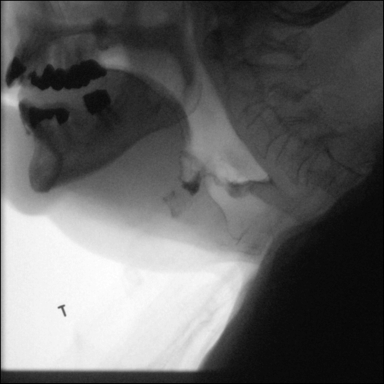

[Series 16: cp_standard · 0.34mm/px · 1 of 62 frames shown (15 of 17)]
[frame 32/62]
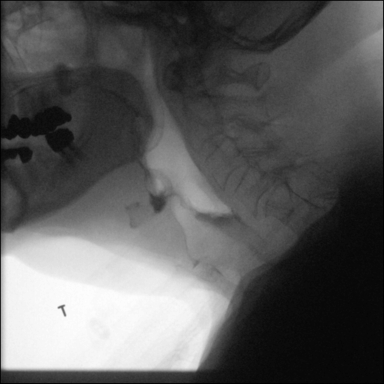

[Series 17: cp_standard · 0.34mm/px · 2 of 97 frames shown (16 of 17)]
[frame 49/97]
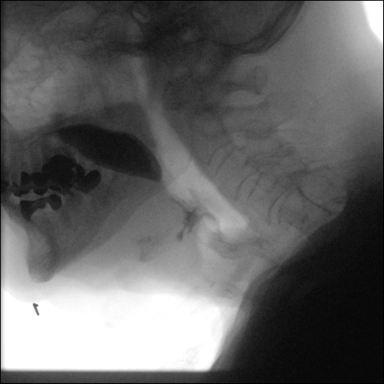
[frame 92/97]
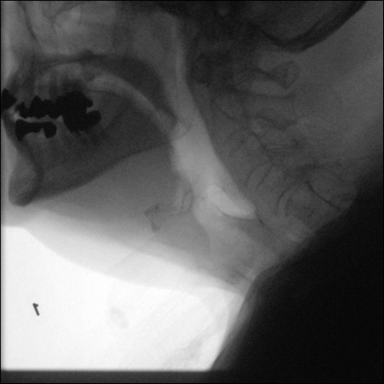

[Series 18: cp_standard · 0.34mm/px · 1 of 12 frames shown (17 of 17)]
[frame 11/12]
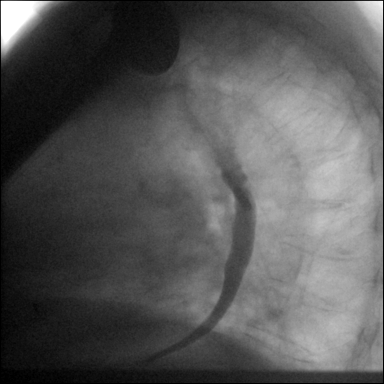

[24 of 24 positions shown; findings below may reference images not displayed]

FINDINGS: With thin liquids, there is flash penetration and delayed swallow
trigger. Post swallow retention.

With puree thickness, premature spill and delayed swallow trigger.
Post swallow retention.

With nectar thickness, delayed oral transit and flash penetration.
Post swallow retention.

With puree with a cracker, delayed swallow trigger and post swallow
retention.

A 13 mm barium tablet with puree has delayed passage in the mid
esophagus, likely secondary to dysmotility.
IMPRESSION: Swallow dysfunction, as detailed above.

Please refer to the Speech Pathologists report for complete details
and recommendations.

## 2021-07-02 DIAGNOSIS — I2510 Atherosclerotic heart disease of native coronary artery without angina pectoris: Secondary | ICD-10-CM | POA: Diagnosis not present

## 2021-07-02 DIAGNOSIS — F0280 Dementia in other diseases classified elsewhere without behavioral disturbance: Secondary | ICD-10-CM | POA: Diagnosis not present

## 2021-07-02 DIAGNOSIS — R1310 Dysphagia, unspecified: Secondary | ICD-10-CM | POA: Diagnosis not present

## 2021-07-02 DIAGNOSIS — I129 Hypertensive chronic kidney disease with stage 1 through stage 4 chronic kidney disease, or unspecified chronic kidney disease: Secondary | ICD-10-CM | POA: Diagnosis not present

## 2021-07-02 DIAGNOSIS — G309 Alzheimer's disease, unspecified: Secondary | ICD-10-CM | POA: Diagnosis not present

## 2021-07-02 DIAGNOSIS — I4891 Unspecified atrial fibrillation: Secondary | ICD-10-CM | POA: Diagnosis not present

## 2021-07-04 DIAGNOSIS — I4891 Unspecified atrial fibrillation: Secondary | ICD-10-CM | POA: Diagnosis not present

## 2021-07-04 DIAGNOSIS — R1310 Dysphagia, unspecified: Secondary | ICD-10-CM | POA: Diagnosis not present

## 2021-07-04 DIAGNOSIS — I129 Hypertensive chronic kidney disease with stage 1 through stage 4 chronic kidney disease, or unspecified chronic kidney disease: Secondary | ICD-10-CM | POA: Diagnosis not present

## 2021-07-04 DIAGNOSIS — I2510 Atherosclerotic heart disease of native coronary artery without angina pectoris: Secondary | ICD-10-CM | POA: Diagnosis not present

## 2021-07-04 DIAGNOSIS — G309 Alzheimer's disease, unspecified: Secondary | ICD-10-CM | POA: Diagnosis not present

## 2021-07-04 DIAGNOSIS — F0280 Dementia in other diseases classified elsewhere without behavioral disturbance: Secondary | ICD-10-CM | POA: Diagnosis not present

## 2021-07-09 DIAGNOSIS — R1310 Dysphagia, unspecified: Secondary | ICD-10-CM | POA: Diagnosis not present

## 2021-07-09 DIAGNOSIS — I2510 Atherosclerotic heart disease of native coronary artery without angina pectoris: Secondary | ICD-10-CM | POA: Diagnosis not present

## 2021-07-09 DIAGNOSIS — F0280 Dementia in other diseases classified elsewhere without behavioral disturbance: Secondary | ICD-10-CM | POA: Diagnosis not present

## 2021-07-09 DIAGNOSIS — N183 Chronic kidney disease, stage 3 unspecified: Secondary | ICD-10-CM | POA: Diagnosis not present

## 2021-07-09 DIAGNOSIS — G309 Alzheimer's disease, unspecified: Secondary | ICD-10-CM | POA: Diagnosis not present

## 2021-07-09 DIAGNOSIS — I129 Hypertensive chronic kidney disease with stage 1 through stage 4 chronic kidney disease, or unspecified chronic kidney disease: Secondary | ICD-10-CM | POA: Diagnosis not present

## 2021-07-09 DIAGNOSIS — H3530 Unspecified macular degeneration: Secondary | ICD-10-CM | POA: Diagnosis not present

## 2021-07-09 DIAGNOSIS — K219 Gastro-esophageal reflux disease without esophagitis: Secondary | ICD-10-CM | POA: Diagnosis not present

## 2021-07-09 DIAGNOSIS — E785 Hyperlipidemia, unspecified: Secondary | ICD-10-CM | POA: Diagnosis not present

## 2021-07-09 DIAGNOSIS — I4891 Unspecified atrial fibrillation: Secondary | ICD-10-CM | POA: Diagnosis not present

## 2021-07-10 DIAGNOSIS — G309 Alzheimer's disease, unspecified: Secondary | ICD-10-CM | POA: Diagnosis not present

## 2021-07-10 DIAGNOSIS — I129 Hypertensive chronic kidney disease with stage 1 through stage 4 chronic kidney disease, or unspecified chronic kidney disease: Secondary | ICD-10-CM | POA: Diagnosis not present

## 2021-07-10 DIAGNOSIS — F0280 Dementia in other diseases classified elsewhere without behavioral disturbance: Secondary | ICD-10-CM | POA: Diagnosis not present

## 2021-07-10 DIAGNOSIS — I4891 Unspecified atrial fibrillation: Secondary | ICD-10-CM | POA: Diagnosis not present

## 2021-07-10 DIAGNOSIS — R1310 Dysphagia, unspecified: Secondary | ICD-10-CM | POA: Diagnosis not present

## 2021-07-10 DIAGNOSIS — I2510 Atherosclerotic heart disease of native coronary artery without angina pectoris: Secondary | ICD-10-CM | POA: Diagnosis not present

## 2021-07-16 DIAGNOSIS — I2510 Atherosclerotic heart disease of native coronary artery without angina pectoris: Secondary | ICD-10-CM | POA: Diagnosis not present

## 2021-07-16 DIAGNOSIS — G309 Alzheimer's disease, unspecified: Secondary | ICD-10-CM | POA: Diagnosis not present

## 2021-07-16 DIAGNOSIS — I129 Hypertensive chronic kidney disease with stage 1 through stage 4 chronic kidney disease, or unspecified chronic kidney disease: Secondary | ICD-10-CM | POA: Diagnosis not present

## 2021-07-16 DIAGNOSIS — R1310 Dysphagia, unspecified: Secondary | ICD-10-CM | POA: Diagnosis not present

## 2021-07-16 DIAGNOSIS — F0280 Dementia in other diseases classified elsewhere without behavioral disturbance: Secondary | ICD-10-CM | POA: Diagnosis not present

## 2021-07-16 DIAGNOSIS — I4891 Unspecified atrial fibrillation: Secondary | ICD-10-CM | POA: Diagnosis not present

## 2021-07-17 DIAGNOSIS — F0280 Dementia in other diseases classified elsewhere without behavioral disturbance: Secondary | ICD-10-CM | POA: Diagnosis not present

## 2021-07-17 DIAGNOSIS — R1310 Dysphagia, unspecified: Secondary | ICD-10-CM | POA: Diagnosis not present

## 2021-07-17 DIAGNOSIS — I2510 Atherosclerotic heart disease of native coronary artery without angina pectoris: Secondary | ICD-10-CM | POA: Diagnosis not present

## 2021-07-17 DIAGNOSIS — G309 Alzheimer's disease, unspecified: Secondary | ICD-10-CM | POA: Diagnosis not present

## 2021-07-17 DIAGNOSIS — I129 Hypertensive chronic kidney disease with stage 1 through stage 4 chronic kidney disease, or unspecified chronic kidney disease: Secondary | ICD-10-CM | POA: Diagnosis not present

## 2021-07-17 DIAGNOSIS — I4891 Unspecified atrial fibrillation: Secondary | ICD-10-CM | POA: Diagnosis not present

## 2021-07-24 DIAGNOSIS — R1310 Dysphagia, unspecified: Secondary | ICD-10-CM | POA: Diagnosis not present

## 2021-07-24 DIAGNOSIS — G309 Alzheimer's disease, unspecified: Secondary | ICD-10-CM | POA: Diagnosis not present

## 2021-07-24 DIAGNOSIS — F0280 Dementia in other diseases classified elsewhere without behavioral disturbance: Secondary | ICD-10-CM | POA: Diagnosis not present

## 2021-07-24 DIAGNOSIS — I129 Hypertensive chronic kidney disease with stage 1 through stage 4 chronic kidney disease, or unspecified chronic kidney disease: Secondary | ICD-10-CM | POA: Diagnosis not present

## 2021-07-24 DIAGNOSIS — I2510 Atherosclerotic heart disease of native coronary artery without angina pectoris: Secondary | ICD-10-CM | POA: Diagnosis not present

## 2021-07-24 DIAGNOSIS — I4891 Unspecified atrial fibrillation: Secondary | ICD-10-CM | POA: Diagnosis not present

## 2021-07-31 DIAGNOSIS — R1310 Dysphagia, unspecified: Secondary | ICD-10-CM | POA: Diagnosis not present

## 2021-07-31 DIAGNOSIS — F0280 Dementia in other diseases classified elsewhere without behavioral disturbance: Secondary | ICD-10-CM | POA: Diagnosis not present

## 2021-07-31 DIAGNOSIS — G309 Alzheimer's disease, unspecified: Secondary | ICD-10-CM | POA: Diagnosis not present

## 2021-07-31 DIAGNOSIS — I4891 Unspecified atrial fibrillation: Secondary | ICD-10-CM | POA: Diagnosis not present

## 2021-07-31 DIAGNOSIS — I2510 Atherosclerotic heart disease of native coronary artery without angina pectoris: Secondary | ICD-10-CM | POA: Diagnosis not present

## 2021-07-31 DIAGNOSIS — I129 Hypertensive chronic kidney disease with stage 1 through stage 4 chronic kidney disease, or unspecified chronic kidney disease: Secondary | ICD-10-CM | POA: Diagnosis not present

## 2021-08-05 DIAGNOSIS — R1310 Dysphagia, unspecified: Secondary | ICD-10-CM | POA: Diagnosis not present

## 2021-08-05 DIAGNOSIS — I129 Hypertensive chronic kidney disease with stage 1 through stage 4 chronic kidney disease, or unspecified chronic kidney disease: Secondary | ICD-10-CM | POA: Diagnosis not present

## 2021-08-05 DIAGNOSIS — G309 Alzheimer's disease, unspecified: Secondary | ICD-10-CM | POA: Diagnosis not present

## 2021-08-05 DIAGNOSIS — F0280 Dementia in other diseases classified elsewhere without behavioral disturbance: Secondary | ICD-10-CM | POA: Diagnosis not present

## 2021-08-05 DIAGNOSIS — I4891 Unspecified atrial fibrillation: Secondary | ICD-10-CM | POA: Diagnosis not present

## 2021-08-05 DIAGNOSIS — I2510 Atherosclerotic heart disease of native coronary artery without angina pectoris: Secondary | ICD-10-CM | POA: Diagnosis not present

## 2021-08-07 DIAGNOSIS — F0280 Dementia in other diseases classified elsewhere without behavioral disturbance: Secondary | ICD-10-CM | POA: Diagnosis not present

## 2021-08-07 DIAGNOSIS — I129 Hypertensive chronic kidney disease with stage 1 through stage 4 chronic kidney disease, or unspecified chronic kidney disease: Secondary | ICD-10-CM | POA: Diagnosis not present

## 2021-08-07 DIAGNOSIS — R1310 Dysphagia, unspecified: Secondary | ICD-10-CM | POA: Diagnosis not present

## 2021-08-07 DIAGNOSIS — I2510 Atherosclerotic heart disease of native coronary artery without angina pectoris: Secondary | ICD-10-CM | POA: Diagnosis not present

## 2021-08-07 DIAGNOSIS — G309 Alzheimer's disease, unspecified: Secondary | ICD-10-CM | POA: Diagnosis not present

## 2021-08-07 DIAGNOSIS — I4891 Unspecified atrial fibrillation: Secondary | ICD-10-CM | POA: Diagnosis not present

## 2021-08-08 DIAGNOSIS — I129 Hypertensive chronic kidney disease with stage 1 through stage 4 chronic kidney disease, or unspecified chronic kidney disease: Secondary | ICD-10-CM | POA: Diagnosis not present

## 2021-08-08 DIAGNOSIS — F0280 Dementia in other diseases classified elsewhere without behavioral disturbance: Secondary | ICD-10-CM | POA: Diagnosis not present

## 2021-08-08 DIAGNOSIS — N183 Chronic kidney disease, stage 3 unspecified: Secondary | ICD-10-CM | POA: Diagnosis not present

## 2021-08-08 DIAGNOSIS — I2510 Atherosclerotic heart disease of native coronary artery without angina pectoris: Secondary | ICD-10-CM | POA: Diagnosis not present

## 2021-08-08 DIAGNOSIS — G309 Alzheimer's disease, unspecified: Secondary | ICD-10-CM | POA: Diagnosis not present

## 2021-08-08 DIAGNOSIS — H3530 Unspecified macular degeneration: Secondary | ICD-10-CM | POA: Diagnosis not present

## 2021-08-08 DIAGNOSIS — R1310 Dysphagia, unspecified: Secondary | ICD-10-CM | POA: Diagnosis not present

## 2021-08-08 DIAGNOSIS — K219 Gastro-esophageal reflux disease without esophagitis: Secondary | ICD-10-CM | POA: Diagnosis not present

## 2021-08-08 DIAGNOSIS — I4891 Unspecified atrial fibrillation: Secondary | ICD-10-CM | POA: Diagnosis not present

## 2021-08-08 DIAGNOSIS — E785 Hyperlipidemia, unspecified: Secondary | ICD-10-CM | POA: Diagnosis not present

## 2021-08-20 DIAGNOSIS — I129 Hypertensive chronic kidney disease with stage 1 through stage 4 chronic kidney disease, or unspecified chronic kidney disease: Secondary | ICD-10-CM | POA: Diagnosis not present

## 2021-08-20 DIAGNOSIS — G309 Alzheimer's disease, unspecified: Secondary | ICD-10-CM | POA: Diagnosis not present

## 2021-08-20 DIAGNOSIS — F0280 Dementia in other diseases classified elsewhere without behavioral disturbance: Secondary | ICD-10-CM | POA: Diagnosis not present

## 2021-08-20 DIAGNOSIS — I4891 Unspecified atrial fibrillation: Secondary | ICD-10-CM | POA: Diagnosis not present

## 2021-08-20 DIAGNOSIS — I2510 Atherosclerotic heart disease of native coronary artery without angina pectoris: Secondary | ICD-10-CM | POA: Diagnosis not present

## 2021-08-20 DIAGNOSIS — R1310 Dysphagia, unspecified: Secondary | ICD-10-CM | POA: Diagnosis not present

## 2021-08-22 ENCOUNTER — Other Ambulatory Visit: Payer: Self-pay | Admitting: Interventional Cardiology

## 2021-08-22 DIAGNOSIS — I2510 Atherosclerotic heart disease of native coronary artery without angina pectoris: Secondary | ICD-10-CM | POA: Diagnosis not present

## 2021-08-22 DIAGNOSIS — G309 Alzheimer's disease, unspecified: Secondary | ICD-10-CM | POA: Diagnosis not present

## 2021-08-22 DIAGNOSIS — F0280 Dementia in other diseases classified elsewhere without behavioral disturbance: Secondary | ICD-10-CM | POA: Diagnosis not present

## 2021-08-22 DIAGNOSIS — I129 Hypertensive chronic kidney disease with stage 1 through stage 4 chronic kidney disease, or unspecified chronic kidney disease: Secondary | ICD-10-CM | POA: Diagnosis not present

## 2021-08-22 DIAGNOSIS — I4891 Unspecified atrial fibrillation: Secondary | ICD-10-CM | POA: Diagnosis not present

## 2021-08-22 DIAGNOSIS — R1310 Dysphagia, unspecified: Secondary | ICD-10-CM | POA: Diagnosis not present

## 2021-08-28 DIAGNOSIS — R1310 Dysphagia, unspecified: Secondary | ICD-10-CM | POA: Diagnosis not present

## 2021-08-28 DIAGNOSIS — F0280 Dementia in other diseases classified elsewhere without behavioral disturbance: Secondary | ICD-10-CM | POA: Diagnosis not present

## 2021-08-28 DIAGNOSIS — I2510 Atherosclerotic heart disease of native coronary artery without angina pectoris: Secondary | ICD-10-CM | POA: Diagnosis not present

## 2021-08-28 DIAGNOSIS — G309 Alzheimer's disease, unspecified: Secondary | ICD-10-CM | POA: Diagnosis not present

## 2021-08-28 DIAGNOSIS — I4891 Unspecified atrial fibrillation: Secondary | ICD-10-CM | POA: Diagnosis not present

## 2021-08-28 DIAGNOSIS — I129 Hypertensive chronic kidney disease with stage 1 through stage 4 chronic kidney disease, or unspecified chronic kidney disease: Secondary | ICD-10-CM | POA: Diagnosis not present

## 2021-09-03 DIAGNOSIS — I129 Hypertensive chronic kidney disease with stage 1 through stage 4 chronic kidney disease, or unspecified chronic kidney disease: Secondary | ICD-10-CM | POA: Diagnosis not present

## 2021-09-03 DIAGNOSIS — R1310 Dysphagia, unspecified: Secondary | ICD-10-CM | POA: Diagnosis not present

## 2021-09-03 DIAGNOSIS — F0280 Dementia in other diseases classified elsewhere without behavioral disturbance: Secondary | ICD-10-CM | POA: Diagnosis not present

## 2021-09-03 DIAGNOSIS — G309 Alzheimer's disease, unspecified: Secondary | ICD-10-CM | POA: Diagnosis not present

## 2021-09-03 DIAGNOSIS — I4891 Unspecified atrial fibrillation: Secondary | ICD-10-CM | POA: Diagnosis not present

## 2021-09-03 DIAGNOSIS — I2510 Atherosclerotic heart disease of native coronary artery without angina pectoris: Secondary | ICD-10-CM | POA: Diagnosis not present

## 2021-09-05 DIAGNOSIS — F0280 Dementia in other diseases classified elsewhere without behavioral disturbance: Secondary | ICD-10-CM | POA: Diagnosis not present

## 2021-09-05 DIAGNOSIS — G309 Alzheimer's disease, unspecified: Secondary | ICD-10-CM | POA: Diagnosis not present

## 2021-09-05 DIAGNOSIS — I2510 Atherosclerotic heart disease of native coronary artery without angina pectoris: Secondary | ICD-10-CM | POA: Diagnosis not present

## 2021-09-05 DIAGNOSIS — I129 Hypertensive chronic kidney disease with stage 1 through stage 4 chronic kidney disease, or unspecified chronic kidney disease: Secondary | ICD-10-CM | POA: Diagnosis not present

## 2021-09-05 DIAGNOSIS — R1310 Dysphagia, unspecified: Secondary | ICD-10-CM | POA: Diagnosis not present

## 2021-09-05 DIAGNOSIS — I4891 Unspecified atrial fibrillation: Secondary | ICD-10-CM | POA: Diagnosis not present

## 2021-09-08 DIAGNOSIS — E785 Hyperlipidemia, unspecified: Secondary | ICD-10-CM | POA: Diagnosis not present

## 2021-09-08 DIAGNOSIS — I2510 Atherosclerotic heart disease of native coronary artery without angina pectoris: Secondary | ICD-10-CM | POA: Diagnosis not present

## 2021-09-08 DIAGNOSIS — K219 Gastro-esophageal reflux disease without esophagitis: Secondary | ICD-10-CM | POA: Diagnosis not present

## 2021-09-08 DIAGNOSIS — R1310 Dysphagia, unspecified: Secondary | ICD-10-CM | POA: Diagnosis not present

## 2021-09-08 DIAGNOSIS — F0280 Dementia in other diseases classified elsewhere without behavioral disturbance: Secondary | ICD-10-CM | POA: Diagnosis not present

## 2021-09-08 DIAGNOSIS — G309 Alzheimer's disease, unspecified: Secondary | ICD-10-CM | POA: Diagnosis not present

## 2021-09-08 DIAGNOSIS — H3530 Unspecified macular degeneration: Secondary | ICD-10-CM | POA: Diagnosis not present

## 2021-09-08 DIAGNOSIS — I129 Hypertensive chronic kidney disease with stage 1 through stage 4 chronic kidney disease, or unspecified chronic kidney disease: Secondary | ICD-10-CM | POA: Diagnosis not present

## 2021-09-08 DIAGNOSIS — I4891 Unspecified atrial fibrillation: Secondary | ICD-10-CM | POA: Diagnosis not present

## 2021-09-08 DIAGNOSIS — N183 Chronic kidney disease, stage 3 unspecified: Secondary | ICD-10-CM | POA: Diagnosis not present

## 2021-09-11 DIAGNOSIS — R1310 Dysphagia, unspecified: Secondary | ICD-10-CM | POA: Diagnosis not present

## 2021-09-11 DIAGNOSIS — I129 Hypertensive chronic kidney disease with stage 1 through stage 4 chronic kidney disease, or unspecified chronic kidney disease: Secondary | ICD-10-CM | POA: Diagnosis not present

## 2021-09-11 DIAGNOSIS — I4891 Unspecified atrial fibrillation: Secondary | ICD-10-CM | POA: Diagnosis not present

## 2021-09-11 DIAGNOSIS — I2510 Atherosclerotic heart disease of native coronary artery without angina pectoris: Secondary | ICD-10-CM | POA: Diagnosis not present

## 2021-09-11 DIAGNOSIS — F0280 Dementia in other diseases classified elsewhere without behavioral disturbance: Secondary | ICD-10-CM | POA: Diagnosis not present

## 2021-09-11 DIAGNOSIS — G309 Alzheimer's disease, unspecified: Secondary | ICD-10-CM | POA: Diagnosis not present

## 2021-09-18 DIAGNOSIS — I2510 Atherosclerotic heart disease of native coronary artery without angina pectoris: Secondary | ICD-10-CM | POA: Diagnosis not present

## 2021-09-18 DIAGNOSIS — I129 Hypertensive chronic kidney disease with stage 1 through stage 4 chronic kidney disease, or unspecified chronic kidney disease: Secondary | ICD-10-CM | POA: Diagnosis not present

## 2021-09-18 DIAGNOSIS — G309 Alzheimer's disease, unspecified: Secondary | ICD-10-CM | POA: Diagnosis not present

## 2021-09-18 DIAGNOSIS — R1310 Dysphagia, unspecified: Secondary | ICD-10-CM | POA: Diagnosis not present

## 2021-09-18 DIAGNOSIS — F0280 Dementia in other diseases classified elsewhere without behavioral disturbance: Secondary | ICD-10-CM | POA: Diagnosis not present

## 2021-09-18 DIAGNOSIS — I4891 Unspecified atrial fibrillation: Secondary | ICD-10-CM | POA: Diagnosis not present

## 2021-09-20 DIAGNOSIS — I4891 Unspecified atrial fibrillation: Secondary | ICD-10-CM | POA: Diagnosis not present

## 2021-09-20 DIAGNOSIS — R1310 Dysphagia, unspecified: Secondary | ICD-10-CM | POA: Diagnosis not present

## 2021-09-20 DIAGNOSIS — F0280 Dementia in other diseases classified elsewhere without behavioral disturbance: Secondary | ICD-10-CM | POA: Diagnosis not present

## 2021-09-20 DIAGNOSIS — G309 Alzheimer's disease, unspecified: Secondary | ICD-10-CM | POA: Diagnosis not present

## 2021-09-20 DIAGNOSIS — I2510 Atherosclerotic heart disease of native coronary artery without angina pectoris: Secondary | ICD-10-CM | POA: Diagnosis not present

## 2021-09-20 DIAGNOSIS — I129 Hypertensive chronic kidney disease with stage 1 through stage 4 chronic kidney disease, or unspecified chronic kidney disease: Secondary | ICD-10-CM | POA: Diagnosis not present

## 2021-09-25 DIAGNOSIS — F0280 Dementia in other diseases classified elsewhere without behavioral disturbance: Secondary | ICD-10-CM | POA: Diagnosis not present

## 2021-09-25 DIAGNOSIS — I129 Hypertensive chronic kidney disease with stage 1 through stage 4 chronic kidney disease, or unspecified chronic kidney disease: Secondary | ICD-10-CM | POA: Diagnosis not present

## 2021-09-25 DIAGNOSIS — I2510 Atherosclerotic heart disease of native coronary artery without angina pectoris: Secondary | ICD-10-CM | POA: Diagnosis not present

## 2021-09-25 DIAGNOSIS — R1310 Dysphagia, unspecified: Secondary | ICD-10-CM | POA: Diagnosis not present

## 2021-09-25 DIAGNOSIS — G309 Alzheimer's disease, unspecified: Secondary | ICD-10-CM | POA: Diagnosis not present

## 2021-09-25 DIAGNOSIS — I4891 Unspecified atrial fibrillation: Secondary | ICD-10-CM | POA: Diagnosis not present

## 2021-10-02 DIAGNOSIS — G309 Alzheimer's disease, unspecified: Secondary | ICD-10-CM | POA: Diagnosis not present

## 2021-10-02 DIAGNOSIS — I2510 Atherosclerotic heart disease of native coronary artery without angina pectoris: Secondary | ICD-10-CM | POA: Diagnosis not present

## 2021-10-02 DIAGNOSIS — I4891 Unspecified atrial fibrillation: Secondary | ICD-10-CM | POA: Diagnosis not present

## 2021-10-02 DIAGNOSIS — R1310 Dysphagia, unspecified: Secondary | ICD-10-CM | POA: Diagnosis not present

## 2021-10-02 DIAGNOSIS — F0280 Dementia in other diseases classified elsewhere without behavioral disturbance: Secondary | ICD-10-CM | POA: Diagnosis not present

## 2021-10-02 DIAGNOSIS — I129 Hypertensive chronic kidney disease with stage 1 through stage 4 chronic kidney disease, or unspecified chronic kidney disease: Secondary | ICD-10-CM | POA: Diagnosis not present

## 2021-10-09 DIAGNOSIS — H3530 Unspecified macular degeneration: Secondary | ICD-10-CM | POA: Diagnosis not present

## 2021-10-09 DIAGNOSIS — G309 Alzheimer's disease, unspecified: Secondary | ICD-10-CM | POA: Diagnosis not present

## 2021-10-09 DIAGNOSIS — I129 Hypertensive chronic kidney disease with stage 1 through stage 4 chronic kidney disease, or unspecified chronic kidney disease: Secondary | ICD-10-CM | POA: Diagnosis not present

## 2021-10-09 DIAGNOSIS — E785 Hyperlipidemia, unspecified: Secondary | ICD-10-CM | POA: Diagnosis not present

## 2021-10-09 DIAGNOSIS — F0280 Dementia in other diseases classified elsewhere without behavioral disturbance: Secondary | ICD-10-CM | POA: Diagnosis not present

## 2021-10-09 DIAGNOSIS — I4891 Unspecified atrial fibrillation: Secondary | ICD-10-CM | POA: Diagnosis not present

## 2021-10-09 DIAGNOSIS — R1310 Dysphagia, unspecified: Secondary | ICD-10-CM | POA: Diagnosis not present

## 2021-10-09 DIAGNOSIS — I2510 Atherosclerotic heart disease of native coronary artery without angina pectoris: Secondary | ICD-10-CM | POA: Diagnosis not present

## 2021-10-09 DIAGNOSIS — K219 Gastro-esophageal reflux disease without esophagitis: Secondary | ICD-10-CM | POA: Diagnosis not present

## 2021-10-09 DIAGNOSIS — N183 Chronic kidney disease, stage 3 unspecified: Secondary | ICD-10-CM | POA: Diagnosis not present

## 2021-10-10 DIAGNOSIS — G309 Alzheimer's disease, unspecified: Secondary | ICD-10-CM | POA: Diagnosis not present

## 2021-10-10 DIAGNOSIS — R1310 Dysphagia, unspecified: Secondary | ICD-10-CM | POA: Diagnosis not present

## 2021-10-10 DIAGNOSIS — I4891 Unspecified atrial fibrillation: Secondary | ICD-10-CM | POA: Diagnosis not present

## 2021-10-10 DIAGNOSIS — I129 Hypertensive chronic kidney disease with stage 1 through stage 4 chronic kidney disease, or unspecified chronic kidney disease: Secondary | ICD-10-CM | POA: Diagnosis not present

## 2021-10-10 DIAGNOSIS — F0280 Dementia in other diseases classified elsewhere without behavioral disturbance: Secondary | ICD-10-CM | POA: Diagnosis not present

## 2021-10-10 DIAGNOSIS — I2510 Atherosclerotic heart disease of native coronary artery without angina pectoris: Secondary | ICD-10-CM | POA: Diagnosis not present

## 2021-10-16 DIAGNOSIS — I129 Hypertensive chronic kidney disease with stage 1 through stage 4 chronic kidney disease, or unspecified chronic kidney disease: Secondary | ICD-10-CM | POA: Diagnosis not present

## 2021-10-16 DIAGNOSIS — I2510 Atherosclerotic heart disease of native coronary artery without angina pectoris: Secondary | ICD-10-CM | POA: Diagnosis not present

## 2021-10-16 DIAGNOSIS — F0280 Dementia in other diseases classified elsewhere without behavioral disturbance: Secondary | ICD-10-CM | POA: Diagnosis not present

## 2021-10-16 DIAGNOSIS — G309 Alzheimer's disease, unspecified: Secondary | ICD-10-CM | POA: Diagnosis not present

## 2021-10-16 DIAGNOSIS — R1310 Dysphagia, unspecified: Secondary | ICD-10-CM | POA: Diagnosis not present

## 2021-10-16 DIAGNOSIS — I4891 Unspecified atrial fibrillation: Secondary | ICD-10-CM | POA: Diagnosis not present

## 2021-10-23 DIAGNOSIS — R1310 Dysphagia, unspecified: Secondary | ICD-10-CM | POA: Diagnosis not present

## 2021-10-23 DIAGNOSIS — I129 Hypertensive chronic kidney disease with stage 1 through stage 4 chronic kidney disease, or unspecified chronic kidney disease: Secondary | ICD-10-CM | POA: Diagnosis not present

## 2021-10-23 DIAGNOSIS — F0280 Dementia in other diseases classified elsewhere without behavioral disturbance: Secondary | ICD-10-CM | POA: Diagnosis not present

## 2021-10-23 DIAGNOSIS — I2510 Atherosclerotic heart disease of native coronary artery without angina pectoris: Secondary | ICD-10-CM | POA: Diagnosis not present

## 2021-10-23 DIAGNOSIS — I4891 Unspecified atrial fibrillation: Secondary | ICD-10-CM | POA: Diagnosis not present

## 2021-10-23 DIAGNOSIS — G309 Alzheimer's disease, unspecified: Secondary | ICD-10-CM | POA: Diagnosis not present

## 2021-10-28 DIAGNOSIS — F0280 Dementia in other diseases classified elsewhere without behavioral disturbance: Secondary | ICD-10-CM | POA: Diagnosis not present

## 2021-10-28 DIAGNOSIS — I4891 Unspecified atrial fibrillation: Secondary | ICD-10-CM | POA: Diagnosis not present

## 2021-10-28 DIAGNOSIS — I129 Hypertensive chronic kidney disease with stage 1 through stage 4 chronic kidney disease, or unspecified chronic kidney disease: Secondary | ICD-10-CM | POA: Diagnosis not present

## 2021-10-28 DIAGNOSIS — R1310 Dysphagia, unspecified: Secondary | ICD-10-CM | POA: Diagnosis not present

## 2021-10-28 DIAGNOSIS — G309 Alzheimer's disease, unspecified: Secondary | ICD-10-CM | POA: Diagnosis not present

## 2021-10-28 DIAGNOSIS — I2510 Atherosclerotic heart disease of native coronary artery without angina pectoris: Secondary | ICD-10-CM | POA: Diagnosis not present

## 2021-10-30 DIAGNOSIS — R1310 Dysphagia, unspecified: Secondary | ICD-10-CM | POA: Diagnosis not present

## 2021-10-30 DIAGNOSIS — F0280 Dementia in other diseases classified elsewhere without behavioral disturbance: Secondary | ICD-10-CM | POA: Diagnosis not present

## 2021-10-30 DIAGNOSIS — I129 Hypertensive chronic kidney disease with stage 1 through stage 4 chronic kidney disease, or unspecified chronic kidney disease: Secondary | ICD-10-CM | POA: Diagnosis not present

## 2021-10-30 DIAGNOSIS — G309 Alzheimer's disease, unspecified: Secondary | ICD-10-CM | POA: Diagnosis not present

## 2021-10-30 DIAGNOSIS — I4891 Unspecified atrial fibrillation: Secondary | ICD-10-CM | POA: Diagnosis not present

## 2021-10-30 DIAGNOSIS — I2510 Atherosclerotic heart disease of native coronary artery without angina pectoris: Secondary | ICD-10-CM | POA: Diagnosis not present

## 2021-11-04 DIAGNOSIS — G309 Alzheimer's disease, unspecified: Secondary | ICD-10-CM | POA: Diagnosis not present

## 2021-11-04 DIAGNOSIS — I129 Hypertensive chronic kidney disease with stage 1 through stage 4 chronic kidney disease, or unspecified chronic kidney disease: Secondary | ICD-10-CM | POA: Diagnosis not present

## 2021-11-04 DIAGNOSIS — I4891 Unspecified atrial fibrillation: Secondary | ICD-10-CM | POA: Diagnosis not present

## 2021-11-04 DIAGNOSIS — R1310 Dysphagia, unspecified: Secondary | ICD-10-CM | POA: Diagnosis not present

## 2021-11-04 DIAGNOSIS — F0280 Dementia in other diseases classified elsewhere without behavioral disturbance: Secondary | ICD-10-CM | POA: Diagnosis not present

## 2021-11-04 DIAGNOSIS — I2510 Atherosclerotic heart disease of native coronary artery without angina pectoris: Secondary | ICD-10-CM | POA: Diagnosis not present

## 2021-11-06 DIAGNOSIS — N183 Chronic kidney disease, stage 3 unspecified: Secondary | ICD-10-CM | POA: Diagnosis not present

## 2021-11-06 DIAGNOSIS — E785 Hyperlipidemia, unspecified: Secondary | ICD-10-CM | POA: Diagnosis not present

## 2021-11-06 DIAGNOSIS — K219 Gastro-esophageal reflux disease without esophagitis: Secondary | ICD-10-CM | POA: Diagnosis not present

## 2021-11-06 DIAGNOSIS — I2510 Atherosclerotic heart disease of native coronary artery without angina pectoris: Secondary | ICD-10-CM | POA: Diagnosis not present

## 2021-11-06 DIAGNOSIS — G309 Alzheimer's disease, unspecified: Secondary | ICD-10-CM | POA: Diagnosis not present

## 2021-11-06 DIAGNOSIS — I129 Hypertensive chronic kidney disease with stage 1 through stage 4 chronic kidney disease, or unspecified chronic kidney disease: Secondary | ICD-10-CM | POA: Diagnosis not present

## 2021-11-06 DIAGNOSIS — F0280 Dementia in other diseases classified elsewhere without behavioral disturbance: Secondary | ICD-10-CM | POA: Diagnosis not present

## 2021-11-06 DIAGNOSIS — I4891 Unspecified atrial fibrillation: Secondary | ICD-10-CM | POA: Diagnosis not present

## 2021-11-06 DIAGNOSIS — R1310 Dysphagia, unspecified: Secondary | ICD-10-CM | POA: Diagnosis not present

## 2021-11-06 DIAGNOSIS — H3530 Unspecified macular degeneration: Secondary | ICD-10-CM | POA: Diagnosis not present

## 2021-11-12 DIAGNOSIS — F0280 Dementia in other diseases classified elsewhere without behavioral disturbance: Secondary | ICD-10-CM | POA: Diagnosis not present

## 2021-11-12 DIAGNOSIS — I4891 Unspecified atrial fibrillation: Secondary | ICD-10-CM | POA: Diagnosis not present

## 2021-11-12 DIAGNOSIS — I2510 Atherosclerotic heart disease of native coronary artery without angina pectoris: Secondary | ICD-10-CM | POA: Diagnosis not present

## 2021-11-12 DIAGNOSIS — G309 Alzheimer's disease, unspecified: Secondary | ICD-10-CM | POA: Diagnosis not present

## 2021-11-12 DIAGNOSIS — I129 Hypertensive chronic kidney disease with stage 1 through stage 4 chronic kidney disease, or unspecified chronic kidney disease: Secondary | ICD-10-CM | POA: Diagnosis not present

## 2021-11-12 DIAGNOSIS — R1310 Dysphagia, unspecified: Secondary | ICD-10-CM | POA: Diagnosis not present

## 2021-11-13 DIAGNOSIS — R1310 Dysphagia, unspecified: Secondary | ICD-10-CM | POA: Diagnosis not present

## 2021-11-13 DIAGNOSIS — I2510 Atherosclerotic heart disease of native coronary artery without angina pectoris: Secondary | ICD-10-CM | POA: Diagnosis not present

## 2021-11-13 DIAGNOSIS — I129 Hypertensive chronic kidney disease with stage 1 through stage 4 chronic kidney disease, or unspecified chronic kidney disease: Secondary | ICD-10-CM | POA: Diagnosis not present

## 2021-11-13 DIAGNOSIS — I4891 Unspecified atrial fibrillation: Secondary | ICD-10-CM | POA: Diagnosis not present

## 2021-11-13 DIAGNOSIS — G309 Alzheimer's disease, unspecified: Secondary | ICD-10-CM | POA: Diagnosis not present

## 2021-11-13 DIAGNOSIS — F0280 Dementia in other diseases classified elsewhere without behavioral disturbance: Secondary | ICD-10-CM | POA: Diagnosis not present

## 2021-11-21 DIAGNOSIS — G309 Alzheimer's disease, unspecified: Secondary | ICD-10-CM | POA: Diagnosis not present

## 2021-11-21 DIAGNOSIS — I2510 Atherosclerotic heart disease of native coronary artery without angina pectoris: Secondary | ICD-10-CM | POA: Diagnosis not present

## 2021-11-21 DIAGNOSIS — I4891 Unspecified atrial fibrillation: Secondary | ICD-10-CM | POA: Diagnosis not present

## 2021-11-21 DIAGNOSIS — R1310 Dysphagia, unspecified: Secondary | ICD-10-CM | POA: Diagnosis not present

## 2021-11-21 DIAGNOSIS — I129 Hypertensive chronic kidney disease with stage 1 through stage 4 chronic kidney disease, or unspecified chronic kidney disease: Secondary | ICD-10-CM | POA: Diagnosis not present

## 2021-11-21 DIAGNOSIS — F0280 Dementia in other diseases classified elsewhere without behavioral disturbance: Secondary | ICD-10-CM | POA: Diagnosis not present

## 2021-11-27 DIAGNOSIS — R1310 Dysphagia, unspecified: Secondary | ICD-10-CM | POA: Diagnosis not present

## 2021-11-27 DIAGNOSIS — I2510 Atherosclerotic heart disease of native coronary artery without angina pectoris: Secondary | ICD-10-CM | POA: Diagnosis not present

## 2021-11-27 DIAGNOSIS — G309 Alzheimer's disease, unspecified: Secondary | ICD-10-CM | POA: Diagnosis not present

## 2021-11-27 DIAGNOSIS — F0280 Dementia in other diseases classified elsewhere without behavioral disturbance: Secondary | ICD-10-CM | POA: Diagnosis not present

## 2021-11-27 DIAGNOSIS — I4891 Unspecified atrial fibrillation: Secondary | ICD-10-CM | POA: Diagnosis not present

## 2021-11-27 DIAGNOSIS — I129 Hypertensive chronic kidney disease with stage 1 through stage 4 chronic kidney disease, or unspecified chronic kidney disease: Secondary | ICD-10-CM | POA: Diagnosis not present

## 2021-11-28 ENCOUNTER — Other Ambulatory Visit: Payer: Self-pay | Admitting: Interventional Cardiology

## 2021-11-28 DIAGNOSIS — I2510 Atherosclerotic heart disease of native coronary artery without angina pectoris: Secondary | ICD-10-CM | POA: Diagnosis not present

## 2021-11-28 DIAGNOSIS — I4891 Unspecified atrial fibrillation: Secondary | ICD-10-CM | POA: Diagnosis not present

## 2021-11-28 DIAGNOSIS — F0280 Dementia in other diseases classified elsewhere without behavioral disturbance: Secondary | ICD-10-CM | POA: Diagnosis not present

## 2021-11-28 DIAGNOSIS — G309 Alzheimer's disease, unspecified: Secondary | ICD-10-CM | POA: Diagnosis not present

## 2021-11-28 DIAGNOSIS — I129 Hypertensive chronic kidney disease with stage 1 through stage 4 chronic kidney disease, or unspecified chronic kidney disease: Secondary | ICD-10-CM | POA: Diagnosis not present

## 2021-11-28 DIAGNOSIS — R1310 Dysphagia, unspecified: Secondary | ICD-10-CM | POA: Diagnosis not present

## 2021-11-28 NOTE — Telephone Encounter (Addendum)
Prescription refill request for Eliquis received. ?Indication: Afib  ?Last office visit: 12/12/20 Tamala Julian)  ?Scr: 1.46 (12/08/20) ?Age: 88 ?Weight: 84.4kg ? ?Pt is currently on Eliquis 2.'5mg'$  BID, but qualifies for a dosage increase.   ?Most recent serum creatinines they have remained <1.5 since 06/2020, last creat >1.5 was 01/31/20 were it was exactly 1.5.  Has been checked multiple times since than and it has remained less than 1.5.  ? ?Per Dr Thompson Caul note on 12/03/20 -  ?"He faints with associated injury. Leave the Eliquis at lower dose." ? ?Also verified with Marcelle Overlie, Pharm D. Appropriate dose and refill sent to requested pharmacy.  ? ?  ? ? ? ? ? ? ? ? ? ? ?

## 2021-12-04 DIAGNOSIS — I129 Hypertensive chronic kidney disease with stage 1 through stage 4 chronic kidney disease, or unspecified chronic kidney disease: Secondary | ICD-10-CM | POA: Diagnosis not present

## 2021-12-04 DIAGNOSIS — R1310 Dysphagia, unspecified: Secondary | ICD-10-CM | POA: Diagnosis not present

## 2021-12-04 DIAGNOSIS — I2510 Atherosclerotic heart disease of native coronary artery without angina pectoris: Secondary | ICD-10-CM | POA: Diagnosis not present

## 2021-12-04 DIAGNOSIS — G309 Alzheimer's disease, unspecified: Secondary | ICD-10-CM | POA: Diagnosis not present

## 2021-12-04 DIAGNOSIS — F0280 Dementia in other diseases classified elsewhere without behavioral disturbance: Secondary | ICD-10-CM | POA: Diagnosis not present

## 2021-12-04 DIAGNOSIS — I4891 Unspecified atrial fibrillation: Secondary | ICD-10-CM | POA: Diagnosis not present

## 2021-12-07 DIAGNOSIS — E785 Hyperlipidemia, unspecified: Secondary | ICD-10-CM | POA: Diagnosis not present

## 2021-12-07 DIAGNOSIS — K219 Gastro-esophageal reflux disease without esophagitis: Secondary | ICD-10-CM | POA: Diagnosis not present

## 2021-12-07 DIAGNOSIS — Z87440 Personal history of urinary (tract) infections: Secondary | ICD-10-CM | POA: Diagnosis not present

## 2021-12-07 DIAGNOSIS — I2510 Atherosclerotic heart disease of native coronary artery without angina pectoris: Secondary | ICD-10-CM | POA: Diagnosis not present

## 2021-12-07 DIAGNOSIS — I129 Hypertensive chronic kidney disease with stage 1 through stage 4 chronic kidney disease, or unspecified chronic kidney disease: Secondary | ICD-10-CM | POA: Diagnosis not present

## 2021-12-07 DIAGNOSIS — H3530 Unspecified macular degeneration: Secondary | ICD-10-CM | POA: Diagnosis not present

## 2021-12-07 DIAGNOSIS — N183 Chronic kidney disease, stage 3 unspecified: Secondary | ICD-10-CM | POA: Diagnosis not present

## 2021-12-07 DIAGNOSIS — I4891 Unspecified atrial fibrillation: Secondary | ICD-10-CM | POA: Diagnosis not present

## 2021-12-07 DIAGNOSIS — F0280 Dementia in other diseases classified elsewhere without behavioral disturbance: Secondary | ICD-10-CM | POA: Diagnosis not present

## 2021-12-07 DIAGNOSIS — G309 Alzheimer's disease, unspecified: Secondary | ICD-10-CM | POA: Diagnosis not present

## 2021-12-07 DIAGNOSIS — R1310 Dysphagia, unspecified: Secondary | ICD-10-CM | POA: Diagnosis not present

## 2021-12-11 DIAGNOSIS — I4891 Unspecified atrial fibrillation: Secondary | ICD-10-CM | POA: Diagnosis not present

## 2021-12-11 DIAGNOSIS — G309 Alzheimer's disease, unspecified: Secondary | ICD-10-CM | POA: Diagnosis not present

## 2021-12-11 DIAGNOSIS — I129 Hypertensive chronic kidney disease with stage 1 through stage 4 chronic kidney disease, or unspecified chronic kidney disease: Secondary | ICD-10-CM | POA: Diagnosis not present

## 2021-12-11 DIAGNOSIS — F0280 Dementia in other diseases classified elsewhere without behavioral disturbance: Secondary | ICD-10-CM | POA: Diagnosis not present

## 2021-12-11 DIAGNOSIS — R1310 Dysphagia, unspecified: Secondary | ICD-10-CM | POA: Diagnosis not present

## 2021-12-11 DIAGNOSIS — I2510 Atherosclerotic heart disease of native coronary artery without angina pectoris: Secondary | ICD-10-CM | POA: Diagnosis not present

## 2021-12-12 DIAGNOSIS — R1310 Dysphagia, unspecified: Secondary | ICD-10-CM | POA: Diagnosis not present

## 2021-12-12 DIAGNOSIS — F0280 Dementia in other diseases classified elsewhere without behavioral disturbance: Secondary | ICD-10-CM | POA: Diagnosis not present

## 2021-12-12 DIAGNOSIS — I4891 Unspecified atrial fibrillation: Secondary | ICD-10-CM | POA: Diagnosis not present

## 2021-12-12 DIAGNOSIS — G309 Alzheimer's disease, unspecified: Secondary | ICD-10-CM | POA: Diagnosis not present

## 2021-12-12 DIAGNOSIS — I129 Hypertensive chronic kidney disease with stage 1 through stage 4 chronic kidney disease, or unspecified chronic kidney disease: Secondary | ICD-10-CM | POA: Diagnosis not present

## 2021-12-12 DIAGNOSIS — I2510 Atherosclerotic heart disease of native coronary artery without angina pectoris: Secondary | ICD-10-CM | POA: Diagnosis not present

## 2021-12-17 DIAGNOSIS — R1310 Dysphagia, unspecified: Secondary | ICD-10-CM | POA: Diagnosis not present

## 2021-12-17 DIAGNOSIS — I2510 Atherosclerotic heart disease of native coronary artery without angina pectoris: Secondary | ICD-10-CM | POA: Diagnosis not present

## 2021-12-17 DIAGNOSIS — I129 Hypertensive chronic kidney disease with stage 1 through stage 4 chronic kidney disease, or unspecified chronic kidney disease: Secondary | ICD-10-CM | POA: Diagnosis not present

## 2021-12-17 DIAGNOSIS — I4891 Unspecified atrial fibrillation: Secondary | ICD-10-CM | POA: Diagnosis not present

## 2021-12-17 DIAGNOSIS — G309 Alzheimer's disease, unspecified: Secondary | ICD-10-CM | POA: Diagnosis not present

## 2021-12-17 DIAGNOSIS — F0280 Dementia in other diseases classified elsewhere without behavioral disturbance: Secondary | ICD-10-CM | POA: Diagnosis not present

## 2021-12-18 DIAGNOSIS — I2510 Atherosclerotic heart disease of native coronary artery without angina pectoris: Secondary | ICD-10-CM | POA: Diagnosis not present

## 2021-12-18 DIAGNOSIS — F0280 Dementia in other diseases classified elsewhere without behavioral disturbance: Secondary | ICD-10-CM | POA: Diagnosis not present

## 2021-12-18 DIAGNOSIS — I129 Hypertensive chronic kidney disease with stage 1 through stage 4 chronic kidney disease, or unspecified chronic kidney disease: Secondary | ICD-10-CM | POA: Diagnosis not present

## 2021-12-18 DIAGNOSIS — R1310 Dysphagia, unspecified: Secondary | ICD-10-CM | POA: Diagnosis not present

## 2021-12-18 DIAGNOSIS — G309 Alzheimer's disease, unspecified: Secondary | ICD-10-CM | POA: Diagnosis not present

## 2021-12-18 DIAGNOSIS — I4891 Unspecified atrial fibrillation: Secondary | ICD-10-CM | POA: Diagnosis not present

## 2021-12-19 DIAGNOSIS — F0280 Dementia in other diseases classified elsewhere without behavioral disturbance: Secondary | ICD-10-CM | POA: Diagnosis not present

## 2021-12-19 DIAGNOSIS — I129 Hypertensive chronic kidney disease with stage 1 through stage 4 chronic kidney disease, or unspecified chronic kidney disease: Secondary | ICD-10-CM | POA: Diagnosis not present

## 2021-12-19 DIAGNOSIS — G309 Alzheimer's disease, unspecified: Secondary | ICD-10-CM | POA: Diagnosis not present

## 2021-12-19 DIAGNOSIS — I2510 Atherosclerotic heart disease of native coronary artery without angina pectoris: Secondary | ICD-10-CM | POA: Diagnosis not present

## 2021-12-19 DIAGNOSIS — R1310 Dysphagia, unspecified: Secondary | ICD-10-CM | POA: Diagnosis not present

## 2021-12-19 DIAGNOSIS — I4891 Unspecified atrial fibrillation: Secondary | ICD-10-CM | POA: Diagnosis not present

## 2022-01-06 DEATH — deceased
